# Patient Record
Sex: Female | Born: 1950 | Race: White | Hispanic: No
Health system: Midwestern US, Community
[De-identification: ages and names within clinical notes are randomized; demographics above are authoritative.]

## PROBLEM LIST (undated history)

## (undated) DIAGNOSIS — I739 Peripheral vascular disease, unspecified: Secondary | ICD-10-CM

## (undated) DIAGNOSIS — J449 Chronic obstructive pulmonary disease, unspecified: Secondary | ICD-10-CM

## (undated) DIAGNOSIS — IMO0002 Reserved for concepts with insufficient information to code with codable children: Secondary | ICD-10-CM

## (undated) DIAGNOSIS — F419 Anxiety disorder, unspecified: Secondary | ICD-10-CM

## (undated) HISTORY — PX: HAND SURGERY: SHX662

## (undated) HISTORY — PX: ABDOMINAL HYSTERECTOMY: SHX81

## (undated) HISTORY — DX: Reserved for concepts with insufficient information to code with codable children: IMO0002

## (undated) HISTORY — PX: APPENDECTOMY: SHX54

---

## 2009-06-09 ENCOUNTER — Ambulatory Visit: Payer: Self-pay | Admitting: Oncology

## 2009-06-22 ENCOUNTER — Inpatient Hospital Stay: Payer: Self-pay | Admitting: Internal Medicine

## 2009-07-10 ENCOUNTER — Ambulatory Visit: Payer: Self-pay | Admitting: Oncology

## 2014-01-21 ENCOUNTER — Inpatient Hospital Stay: Payer: Self-pay | Admitting: Internal Medicine

## 2014-01-21 LAB — CBC
HCT: 46.5 % (ref 35.0–47.0)
HGB: 15.7 g/dL (ref 12.0–16.0)
MCH: 31.3 pg (ref 26.0–34.0)
MCHC: 33.9 g/dL (ref 32.0–36.0)
MCV: 93 fL (ref 80–100)
Platelet: 199 10*3/uL (ref 150–440)
RBC: 5.02 10*6/uL (ref 3.80–5.20)
RDW: 12.1 % (ref 11.5–14.5)
WBC: 11.8 10*3/uL — ABNORMAL HIGH (ref 3.6–11.0)

## 2014-01-21 LAB — COMPREHENSIVE METABOLIC PANEL
ALK PHOS: 68 U/L
Albumin: 3.9 g/dL (ref 3.4–5.0)
Anion Gap: 7 (ref 7–16)
BILIRUBIN TOTAL: 0.5 mg/dL (ref 0.2–1.0)
BUN: 23 mg/dL — ABNORMAL HIGH (ref 7–18)
CHLORIDE: 94 mmol/L — AB (ref 98–107)
Calcium, Total: 9.1 mg/dL (ref 8.5–10.1)
Co2: 32 mmol/L (ref 21–32)
Creatinine: 0.59 mg/dL — ABNORMAL LOW (ref 0.60–1.30)
EGFR (African American): >60
EGFR (Non-African Amer.): >60
Glucose: 98 mg/dL (ref 65–99)
Osmolality: 270 (ref 275–301)
POTASSIUM: 4 mmol/L (ref 3.5–5.1)
SGOT(AST): 59 U/L — ABNORMAL HIGH (ref 15–37)
SGPT (ALT): 48 U/L
Sodium: 133 mmol/L — ABNORMAL LOW (ref 136–145)
Total Protein: 7.4 g/dL (ref 6.4–8.2)

## 2014-01-21 LAB — CK TOTAL AND CKMB (NOT AT ARMC)
CK, TOTAL: 657 U/L — AB
CK-MB: 40.3 ng/mL — ABNORMAL HIGH (ref 0.5–3.6)

## 2014-01-21 LAB — TROPONIN I: Troponin-I: <0.02 ng/mL

## 2014-01-22 LAB — LIPID PANEL
Cholesterol: 142 mg/dL (ref 0–200)
HDL Cholesterol: 53 mg/dL (ref 40–60)
Ldl Cholesterol, Calc: 75 mg/dL (ref 0–100)
Triglycerides: 72 mg/dL (ref 0–200)
VLDL Cholesterol, Calc: 14 mg/dL (ref 5–40)

## 2014-01-22 LAB — TSH: Thyroid Stimulating Horm: 0.778 u[IU]/mL

## 2014-01-23 LAB — BASIC METABOLIC PANEL
Anion Gap: 4 — ABNORMAL LOW (ref 7–16)
BUN: 21 mg/dL — ABNORMAL HIGH (ref 7–18)
CO2: 31 mmol/L (ref 21–32)
CREATININE: 0.56 mg/dL — AB (ref 0.60–1.30)
Calcium, Total: 7.8 mg/dL — ABNORMAL LOW (ref 8.5–10.1)
Chloride: 112 mmol/L — ABNORMAL HIGH (ref 98–107)
EGFR (Non-African Amer.): >60
Glucose: 80 mg/dL (ref 65–99)
OSMOLALITY: 294 (ref 275–301)
Potassium: 4.4 mmol/L (ref 3.5–5.1)
Sodium: 147 mmol/L — ABNORMAL HIGH (ref 136–145)

## 2014-01-23 LAB — MAGNESIUM: Magnesium: 2.2 mg/dL

## 2014-03-02 LAB — COMPREHENSIVE METABOLIC PANEL
ALBUMIN: 3.6 g/dL (ref 3.4–5.0)
ALK PHOS: 82 U/L
AST: 22 U/L (ref 15–37)
Anion Gap: 7 (ref 7–16)
BUN: 22 mg/dL — AB (ref 7–18)
Bilirubin,Total: 0.3 mg/dL (ref 0.2–1.0)
CALCIUM: 8.8 mg/dL (ref 8.5–10.1)
CHLORIDE: 103 mmol/L (ref 98–107)
CREATININE: 0.72 mg/dL (ref 0.60–1.30)
Co2: 31 mmol/L (ref 21–32)
Glucose: 92 mg/dL (ref 65–99)
Osmolality: 284 (ref 275–301)
POTASSIUM: 3.3 mmol/L — AB (ref 3.5–5.1)
SGPT (ALT): 30 U/L
Sodium: 141 mmol/L (ref 136–145)
TOTAL PROTEIN: 7.1 g/dL (ref 6.4–8.2)

## 2014-03-02 LAB — TSH: Thyroid Stimulating Horm: 1.95 u[IU]/mL

## 2014-03-02 LAB — DRUG SCREEN, URINE
Amphetamines, Ur Screen: NEGATIVE (ref ?–1000)
BARBITURATES, UR SCREEN: NEGATIVE (ref ?–200)
Benzodiazepine, Ur Scrn: POSITIVE (ref ?–200)
CANNABINOID 50 NG, UR ~~LOC~~: NEGATIVE (ref ?–50)
COCAINE METABOLITE, UR ~~LOC~~: NEGATIVE (ref ?–300)
MDMA (Ecstasy)Ur Screen: NEGATIVE (ref ?–500)
METHADONE, UR SCREEN: NEGATIVE (ref ?–300)
Opiate, Ur Screen: NEGATIVE (ref ?–300)
Phencyclidine (PCP) Ur S: NEGATIVE (ref ?–25)
Tricyclic, Ur Screen: NEGATIVE (ref ?–1000)

## 2014-03-02 LAB — URINALYSIS, COMPLETE
Bacteria: NONE SEEN
Bilirubin,UR: NEGATIVE
Glucose,UR: NEGATIVE mg/dL (ref 0–75)
Ketone: NEGATIVE
LEUKOCYTE ESTERASE: NEGATIVE
NITRITE: NEGATIVE
Ph: 5 (ref 4.5–8.0)
RBC,UR: 3 /HPF (ref 0–5)
Specific Gravity: 1.023 (ref 1.003–1.030)
Squamous Epithelial: <1

## 2014-03-02 LAB — CBC
HCT: 42.9 % (ref 35.0–47.0)
HGB: 14.6 g/dL (ref 12.0–16.0)
MCH: 32.5 pg (ref 26.0–34.0)
MCHC: 33.9 g/dL (ref 32.0–36.0)
MCV: 96 fL (ref 80–100)
Platelet: 259 10*3/uL (ref 150–440)
RBC: 4.48 10*6/uL (ref 3.80–5.20)
RDW: 13 % (ref 11.5–14.5)
WBC: 8.8 10*3/uL (ref 3.6–11.0)

## 2014-03-02 LAB — ETHANOL

## 2014-03-02 LAB — ACETAMINOPHEN LEVEL: Acetaminophen: 3 ug/mL — ABNORMAL LOW

## 2014-03-02 LAB — SALICYLATE LEVEL: SALICYLATES, SERUM: 2 mg/dL

## 2014-03-03 ENCOUNTER — Inpatient Hospital Stay: Payer: Self-pay | Admitting: Psychiatry

## 2014-03-04 LAB — RAPID HIV SCREEN (HIV 1/2 AB+AG)

## 2014-03-04 LAB — AMMONIA: Ammonia, Plasma: 15 mcmol/L (ref 11–32)

## 2014-03-11 ENCOUNTER — Emergency Department (HOSPITAL_COMMUNITY): Payer: Medicare Other

## 2014-03-11 ENCOUNTER — Emergency Department (HOSPITAL_COMMUNITY)
Admission: EM | Admit: 2014-03-11 | Discharge: 2014-03-12 | Disposition: A | Discharge status: Home / Self Care | Payer: Medicare Other | Attending: Emergency Medicine | Admitting: Emergency Medicine

## 2014-03-11 ENCOUNTER — Encounter (HOSPITAL_COMMUNITY): Payer: Self-pay | Admitting: *Deleted

## 2014-03-11 DIAGNOSIS — R4182 Altered mental status, unspecified: Secondary | ICD-10-CM | POA: Diagnosis not present

## 2014-03-11 DIAGNOSIS — R531 Weakness: Secondary | ICD-10-CM | POA: Diagnosis present

## 2014-03-11 DIAGNOSIS — E46 Unspecified protein-calorie malnutrition: Secondary | ICD-10-CM | POA: Insufficient documentation

## 2014-03-11 DIAGNOSIS — Z9049 Acquired absence of other specified parts of digestive tract: Secondary | ICD-10-CM | POA: Diagnosis not present

## 2014-03-11 DIAGNOSIS — F411 Generalized anxiety disorder: Secondary | ICD-10-CM

## 2014-03-11 DIAGNOSIS — F419 Anxiety disorder, unspecified: Secondary | ICD-10-CM | POA: Diagnosis not present

## 2014-03-11 DIAGNOSIS — Z90710 Acquired absence of both cervix and uterus: Secondary | ICD-10-CM | POA: Diagnosis not present

## 2014-03-11 DIAGNOSIS — Z7951 Long term (current) use of inhaled steroids: Secondary | ICD-10-CM | POA: Diagnosis not present

## 2014-03-11 DIAGNOSIS — T424X5A Adverse effect of benzodiazepines, initial encounter: Secondary | ICD-10-CM | POA: Diagnosis not present

## 2014-03-11 DIAGNOSIS — R1011 Right upper quadrant pain: Secondary | ICD-10-CM | POA: Diagnosis not present

## 2014-03-11 DIAGNOSIS — R4589 Other symptoms and signs involving emotional state: Secondary | ICD-10-CM

## 2014-03-11 DIAGNOSIS — J449 Chronic obstructive pulmonary disease, unspecified: Secondary | ICD-10-CM | POA: Diagnosis not present

## 2014-03-11 DIAGNOSIS — Z79899 Other long term (current) drug therapy: Secondary | ICD-10-CM | POA: Insufficient documentation

## 2014-03-11 DIAGNOSIS — F329 Major depressive disorder, single episode, unspecified: Secondary | ICD-10-CM | POA: Insufficient documentation

## 2014-03-11 DIAGNOSIS — R45851 Suicidal ideations: Secondary | ICD-10-CM

## 2014-03-11 DIAGNOSIS — R10811 Right upper quadrant abdominal tenderness: Secondary | ICD-10-CM

## 2014-03-11 HISTORY — DX: Anxiety disorder, unspecified: F41.9

## 2014-03-11 HISTORY — DX: Chronic obstructive pulmonary disease, unspecified: J44.9

## 2014-03-11 LAB — COMPREHENSIVE METABOLIC PANEL
ALT: 17 U/L (ref 0–35)
AST: 19 U/L (ref 0–37)
Albumin: 3.2 g/dL — ABNORMAL LOW (ref 3.5–5.2)
Alkaline Phosphatase: 60 U/L (ref 39–117)
Anion gap: 10 (ref 5–15)
BUN: 24 mg/dL — ABNORMAL HIGH (ref 6–23)
CALCIUM: 9.3 mg/dL (ref 8.4–10.5)
CO2: 27 mEq/L (ref 19–32)
CREATININE: 0.53 mg/dL (ref 0.50–1.10)
Chloride: 107 mEq/L (ref 96–112)
GFR calc non Af Amer: >90 mL/min (ref >90)
Glucose, Bld: 82 mg/dL (ref 70–99)
Potassium: 4.1 mEq/L (ref 3.7–5.3)
Sodium: 144 mEq/L (ref 137–147)
Total Protein: 6 g/dL (ref 6.0–8.3)

## 2014-03-11 LAB — RAPID URINE DRUG SCREEN, HOSP PERFORMED
Amphetamines: NOT DETECTED
Barbiturates: NOT DETECTED
Benzodiazepines: NOT DETECTED
COCAINE: NOT DETECTED
OPIATES: NOT DETECTED
Tetrahydrocannabinol: NOT DETECTED

## 2014-03-11 LAB — CBC WITH DIFFERENTIAL/PLATELET
BASOS ABS: 0 10*3/uL (ref 0.0–0.1)
Basophils Relative: 1 % (ref 0–1)
EOS PCT: 2 % (ref 0–5)
Eosinophils Absolute: 0.1 10*3/uL (ref 0.0–0.7)
HEMATOCRIT: 37.5 % (ref 36.0–46.0)
HEMOGLOBIN: 12.2 g/dL (ref 12.0–15.0)
LYMPHS PCT: 17 % (ref 12–46)
Lymphs Abs: 0.9 10*3/uL (ref 0.7–4.0)
MCH: 31.3 pg (ref 26.0–34.0)
MCHC: 32.5 g/dL (ref 30.0–36.0)
MCV: 96.2 fL (ref 78.0–100.0)
MONOS PCT: 14 % — AB (ref 3–12)
Monocytes Absolute: 0.7 10*3/uL (ref 0.1–1.0)
Neutro Abs: 3.5 10*3/uL (ref 1.7–7.7)
Neutrophils Relative %: 66 % (ref 43–77)
Platelets: 179 10*3/uL (ref 150–400)
RBC: 3.9 MIL/uL (ref 3.87–5.11)
RDW: 13 % (ref 11.5–15.5)
WBC: 5.2 10*3/uL (ref 4.0–10.5)

## 2014-03-11 LAB — URINALYSIS, ROUTINE W REFLEX MICROSCOPIC
BILIRUBIN URINE: NEGATIVE
Glucose, UA: NEGATIVE mg/dL
Hgb urine dipstick: NEGATIVE
Ketones, ur: NEGATIVE mg/dL
LEUKOCYTES UA: NEGATIVE
NITRITE: NEGATIVE
PH: 6.5 (ref 5.0–8.0)
Protein, ur: NEGATIVE mg/dL
Specific Gravity, Urine: 1.014 (ref 1.005–1.030)
UROBILINOGEN UA: 0.2 mg/dL (ref 0.0–1.0)

## 2014-03-11 LAB — ACETAMINOPHEN LEVEL

## 2014-03-11 LAB — LIPASE, BLOOD: LIPASE: 28 U/L (ref 11–59)

## 2014-03-11 LAB — ETHANOL: Alcohol, Ethyl (B): <11 mg/dL (ref 0–11)

## 2014-03-11 LAB — SALICYLATE LEVEL

## 2014-03-11 MED ORDER — ALBUTEROL SULFATE HFA 108 (90 BASE) MCG/ACT IN AERS: 2.0000 | INHALATION_SPRAY | Freq: Four times a day (QID) | RESPIRATORY_TRACT | Status: DC | PRN

## 2014-03-11 MED ORDER — ACETAMINOPHEN 325 MG PO TABS: 650.0000 mg | ORAL_TABLET | ORAL | Status: DC | PRN

## 2014-03-11 MED ORDER — CLONAZEPAM 0.5 MG PO TABS: 0.5000 mg | ORAL_TABLET | Freq: Three times a day (TID) | ORAL | Status: DC | PRN

## 2014-03-11 MED ORDER — IBUPROFEN 400 MG PO TABS: 600.0000 mg | ORAL_TABLET | Freq: Three times a day (TID) | ORAL | Status: DC | PRN

## 2014-03-11 MED ORDER — ALUM & MAG HYDROXIDE-SIMETH 200-200-20 MG/5ML PO SUSP: 30.0000 mL | ORAL | Status: DC | PRN

## 2014-03-11 MED ORDER — MIRTAZAPINE 15 MG PO TABS
15.0000 mg | ORAL_TABLET | Freq: Every day | ORAL | Status: DC
  Administered 2014-03-11: 15 mg via ORAL
  Filled 2014-03-11: qty 1

## 2014-03-11 MED ORDER — ADULT MULTIVITAMIN W/MINERALS CH
1.0000 | ORAL_TABLET | Freq: Once | ORAL | Status: AC
  Administered 2014-03-11: 1 via ORAL
  Filled 2014-03-11: qty 1

## 2014-03-11 MED ORDER — VITAMIN B-1 100 MG PO TABS
100.0000 mg | ORAL_TABLET | Freq: Once | ORAL | Status: AC
Start: 2014-03-11 — End: 2014-03-11
  Administered 2014-03-11: 100 mg via ORAL
  Filled 2014-03-11: qty 1

## 2014-03-11 MED ORDER — FOLIC ACID 1 MG PO TABS
1.0000 mg | ORAL_TABLET | Freq: Once | ORAL | Status: AC
  Administered 2014-03-11: 1 mg via ORAL
  Filled 2014-03-11: qty 1

## 2014-03-11 MED ORDER — GUAIFENESIN ER 600 MG PO TB12
600.0000 mg | ORAL_TABLET | Freq: Two times a day (BID) | ORAL | Status: DC
  Administered 2014-03-11 (×2): 600 mg via ORAL
  Filled 2014-03-11 (×3): qty 1

## 2014-03-11 MED ORDER — DIPHENHYDRAMINE HCL 25 MG PO CAPS
25.0000 mg | ORAL_CAPSULE | Freq: Four times a day (QID) | ORAL | Status: DC | PRN
  Administered 2014-03-11: 25 mg via ORAL
  Filled 2014-03-11: qty 1

## 2014-03-11 MED ORDER — GUAIFENESIN 400 MG PO TABS: 400.0000 mg | ORAL_TABLET | Freq: Four times a day (QID) | ORAL | Status: DC | PRN

## 2014-03-11 MED ORDER — ONDANSETRON HCL 4 MG PO TABS: 4.0000 mg | ORAL_TABLET | Freq: Three times a day (TID) | ORAL | Status: DC | PRN

## 2014-03-11 MED ORDER — TIOTROPIUM BROMIDE MONOHYDRATE 18 MCG IN CAPS
18.0000 ug | ORAL_CAPSULE | Freq: Every day | RESPIRATORY_TRACT | Status: DC
  Filled 2014-03-11: qty 5

## 2014-03-11 MED ORDER — ZOLPIDEM TARTRATE 5 MG PO TABS: 5.0000 mg | ORAL_TABLET | Freq: Every evening | ORAL | Status: DC | PRN

## 2014-03-11 MED ORDER — DIPHENHYDRAMINE HCL 25 MG PO TABS: 25.0000 mg | ORAL_TABLET | Freq: Every day | ORAL | Status: DC

## 2014-03-11 MED ORDER — HALOPERIDOL 2 MG PO TABS
2.0000 mg | ORAL_TABLET | Freq: Every day | ORAL | Status: DC
  Administered 2014-03-11: 2 mg via ORAL
  Filled 2014-03-11 (×2): qty 1

## 2014-03-11 MED ORDER — MOMETASONE FURO-FORMOTEROL FUM 100-5 MCG/ACT IN AERO: 2.0000 | INHALATION_SPRAY | Freq: Two times a day (BID) | RESPIRATORY_TRACT | Status: DC

## 2014-03-11 MED ORDER — NICOTINE 21 MG/24HR TD PT24
21.0000 mg | MEDICATED_PATCH | Freq: Every day | TRANSDERMAL | Status: DC
  Filled 2014-03-11: qty 1

## 2014-03-11 NOTE — ED Notes (Signed)
Pt son at bedside.

## 2014-03-11 NOTE — BH Assessment (Addendum)
Tele Assessment Note   Melissa George is an 63 y.o. female female that reports a reaction to a new medication (Cymbalta).  Patient reports depression but denies traumatic events.  Patient reports feelings of stress and an inability to concentrate.  Patient reports that she has been hearing someone mumbeling however, no one else can hear this mumbling.  Patient denies command from the numbering.  Patient reports that she is not able to understand the mumbling.  Patient reports that the mumbling has been going on for the past 2 weeks.    Patient was brought to the ED by her son that flew down from [place] who states that she has been talking acting as if she was very confused when he would speak to her on the phone.  Her son reports gradually worsening confusion and depression.  The patient has been experiencing suicidal thoughts occasionally though she states she does not have these today and has no intention of acting on them.  Patient reported a significant weight loss.  Patient currently weighs 80 pounds and her normal weight is 96 pounds.  Patient reports that she does not have a desire to eat.  Patient denies traumatic events in her life.    Patient denies hallucinating and substance abuse.  Patient denies physical, sexual or emotional abuse.     Axis I: Major Depression, Recurrent severe Axis II: Deferred Axis III:  Past Medical History  Diagnosis Date  . Anxiety   . COPD (chronic obstructive pulmonary disease)    Axis IV: economic problems, problems related to social environment, problems with access to health care services and problems with primary support group Axis V: 31-40 impairment in reality testing  Past Medical History:  Past Medical History  Diagnosis Date  . Anxiety   . COPD (chronic obstructive pulmonary disease)     Past Surgical History  Procedure Laterality Date  . Hand surgery    . Appendectomy    . Abdominal hysterectomy    . Cesarean section      Family  History: No family history on file.  Social History:  reports that she has quit smoking. She does not have any smokeless tobacco history on file. She reports that she does not drink alcohol or use illicit drugs.  Additional Social History:     CIWA: CIWA-Ar BP: 101/61 mmHg Pulse Rate: 85 COWS:    PATIENT STRENGTHS: (choose at least two) Average or above average intelligence Capable of independent living Communication skills Supportive family/friends Work skills  Allergies:  Allergies  Allergen Reactions  . Ciprofloxacin Shortness Of Breath and Itching  . Levofloxacin Other (See Comments)    Unknown  . Morphine And Related Nausea And Vomiting  . Sulfa Antibiotics Hives and Itching  . Symbicort [Budesonide-Formoterol Fumarate] Other (See Comments)    Psychotic episode    Home Medications:  (Not in a hospital admission)  OB/GYN Status:  No LMP recorded. Patient is postmenopausal.  General Assessment Data Location of Assessment: [facility] Is this a Tele or Face-to-Face Assessment?: Tele Assessment Is this an Initial Assessment or a Re-assessment for this encounter?: Initial Assessment Living Arrangements: Alone Can pt return to current living arrangement?: Yes Admission Status: Voluntary Is patient capable of signing voluntary admission?: Yes Transfer from: Home Referral Source: Psychiatrist  Medical Screening Exam ([place]) Medical Exam completed: Yes  [place] Living Arrangements: Alone Name of Psychiatrist: NA Name of Therapist: NA  Education Status Is patient currently in  school?: No Current Grade: NA Highest grade of school patient has completed: NA Name of school: NA Contact person: NA  Risk to self with the past 6 months Suicidal Ideation: No Suicidal Intent: No Is patient at risk for suicide?: No Suicidal Plan?: No Access to Means: No What has been your use of drugs/alcohol within the last 12 months?:  NA Previous Attempts/Gestures: No How many times?: 0 Other Self Harm Risks: NA Triggers for Past Attempts:  (NA) Intentional Self Injurious Behavior: None Family Suicide History: No Recent stressful life event(s): Financial Problems Persecutory voices/beliefs?: Yes Depression: Yes Depression Symptoms: Fatigue, Feeling worthless/self pity, Loss of interest in usual pleasures Substance abuse history and/or treatment for substance abuse?: No Suicide prevention information given to non-admitted patients: Not applicable  Risk to Others within the past 6 months Homicidal Ideation: No Thoughts of Harm to Others: No Current Homicidal Intent: No Current Homicidal Plan: No Access to Homicidal Means: No Identified Victim: NA History of harm to others?: No Assessment of Violence: None Noted Violent Behavior Description: NA Does patient have access to weapons?: No Criminal Charges Pending?: No Does patient have a court date: No  Psychosis Hallucinations: Auditory (Hearing mumbling voices.) Delusions: None noted  Mental Status Report Appear/Hygiene: Disheveled, In scrubs Eye Contact: Fair Motor Activity: Freedom of movement Speech: Logical/coherent Level of Consciousness: Alert Mood: Depressed Affect: Depressed, Blunted Anxiety Level: None Thought Processes: Coherent, Relevant Judgement: Unimpaired Obsessive Compulsive Thoughts/Behaviors: None  Cognitive Functioning Concentration: Normal Memory: Recent Intact, Remote Intact IQ: Average Insight: Fair Impulse Control: Fair Appetite: Poor Weight Loss:  (20 pound weight loss. ) Weight Gain:  (None Reported) Sleep: Decreased Total Hours of Sleep:  (3-4 hours ) Vegetative Symptoms: None  ADLScreening [facility]) Patient's cognitive ability adequate to safely complete daily activities?: Yes Patient able to express need for assistance with ADLs?: Yes Independently performs ADLs?: Yes (appropriate for developmental  age)  Prior Inpatient Therapy Prior Inpatient Therapy: Yes Prior Therapy Dates: 02-2014 Prior Therapy Facilty/Provider(s): [facility]  Reason for Treatment: Depression   Prior Outpatient Therapy Prior Outpatient Therapy: Yes Prior Therapy Dates: Ongoing  Prior Therapy Facilty/Provider(s): Dr. Doren Custard  Reason for Treatment: Medication Management   ADL Screening (condition at time of admission) Patient's cognitive ability adequate to safely complete daily activities?: Yes Patient able to express need for assistance with ADLs?: Yes Independently performs ADLs?: Yes (appropriate for developmental age)             Advance Directives (For Healthcare) Does patient have an advance directive?: No    Additional Information 1:1 In Past 12 Months?: No     Disposition: Per Heloise Purpura patient meets criteria for inpatient hospitalization.  Disposition Initial Assessment Completed for this Encounter: Yes Disposition of Patient: Other dispositions Other disposition(s):  (Pending psych disposition,. )  Johnnye Sima, Kingstyn Deruiter [place] 03/11/2014 3:12 PM

## 2014-03-11 NOTE — ED Notes (Signed)
Pt brought back to [facility].  Pt son inquired about reason mother was in psych unit. Nurse explained if pt expresses suicidal ideation, that pt has to be held for safety reasons.

## 2014-03-11 NOTE — ED Notes (Signed)
Pt inquired about test results from med clearance.  Dewitt Hoes, [place] notified.

## 2014-03-11 NOTE — ED Notes (Signed)
Patient transported to Ultrasound 

## 2014-03-11 NOTE — BH Assessment (Signed)
Writer contacted Melissa George at [facility] regarding the patient beng able to come back to their facility for treatment.  Writer faxed referral to [facility].

## 2014-03-11 NOTE — ED Provider Notes (Signed)
The patient is a 63 year old female, she has a history of depression recently, has felt as though the depression is worsening, she has been talking with her son who lives in [place] who flew down to see her yesterday who states that she has been talking with this pair over the last several days, it is gradually worsening. The patient has been experiencing suicidal thoughts occasionally though she states she does not have these today and has no intention of acting on them. She has also been medically wasting away with a significant weight loss now weighing less than approximately 80 pounds. She denies chest pain abdominal pain back pain dysuria diarrhea fevers chills nausea vomiting or swelling. On exam the patient appears depressed with a flat affect, she is not hallucinating and is not suicidal, she does not drink alcohol or use illegal drugs, she has normal heart and lung sounds and otherwise appears to be in no distress. She would benefit from psychiatric evaluation as her weight loss is likely secondary to her depression.  I personally spoke, with [facility] evaluation personnel to arrange psych eval.  Pt in agreement with consultation request  Medical screening examination/treatment/procedure(s) were conducted as a shared visit with non-physician practitioner(s) and myself.  I personally evaluated the patient during the encounter.  Clinical Impression:   Final diagnoses:  Abdominal tenderness, RUQ (right upper quadrant)  Altered mental status         Johnna Acosta, MD 03/11/14 2014

## 2014-03-11 NOTE — BH Assessment (Signed)
Writer received clinical collateral information from Dr. Sabra Heck.  The doctor reports that she is waisting away due to her not not eating.  Patient resides alone with her dogs.  Dr. Sabra Heck reports that the patient denies SI/HI/Psychosis.  Dr. Sabra Heck reports that the patient did not disclose any traumatic event that has caused her to become depressed and stop eating.

## 2014-03-11 NOTE — ED Notes (Signed)
Pt states that she has generalized weakness and feeling like she was going to fall for over 1 month. Pt family states that she was recently hospitalized for possible psychosis related to medications and COPD exacerbation. Pt has recently had medication including changing from xanax to clonazepam which pt took this morning. Pt family is also concerned with recent weight loss. No neuro deficits noted.

## 2014-03-11 NOTE — ED Provider Notes (Signed)
CSN: [id number]     Arrival date & time 03/11/14  0911 History   First MD Initiated Contact with Patient 03/11/14 0932     Chief Complaint  Patient presents with  . Weakness     (Consider location/radiation/quality/duration/timing/severity/associated sxs/prior Treatment) HPI Comments: Melissa George is a 63 y.o. female with a PMHx of anxiety and COPD, who presents to the ED with complaints of fatigue and weakness x64month. Her son accompanies her today and provides some of the history. He reports that last week she was admitted to [facility] for 4 days after a "psychotic episode" due to being changed from his prevent to Symbicort. She was discharged on Thursday and started on several new medications including Haldol, Benadryl, Remeron, and Spiriva. The son reports that she took her Klonopin this morning without eating. Patient reports that she feels very fatigued at this time, and that over the last month she has felt increasing fatigue and loss of appetite as well as feelings of worthlessness and hopelessness, and endorses a desire to "disappear". Her son states that for many years he has believed that his mother needs to have psychiatric evaluation and care, but she has not been compliant with outpatient management. She reports that this morning she had a episode of lightheadedness and feeling like she was "falling back" but never had any syncopal episode. Additionally she endorses nausea and vomiting intermittently over the last several days, but states she has been able to tolerate fluids and food. She states occasionally she hears "conversations" which she describes as auditory hallucinations but she does not recognize the voices nor do they tell her to do anything specific. She does not have a suicide plan at this time, but states she has thought of suicide. She has noticed her urine has become malodorous, but she denies any dysuria or hematuria. She previously smoked  heavily, but now she takes a nuclear impact. She denies any alcohol or illicit drug use. She denies any visual hallucinations or homicidal ideations. She denies any fevers, chills, chest pain, shortness of breath, vision changes, vertigo, tinnitus, ear pain, cough, wheezing, abdomen pain, hematochezia, melena, hematemesis, vaginal discharge/bleeding, paresthesias, or numbness. Denies back or neck pain.   Patient is a 63 y.o. female presenting with weakness and mental health disorder. The history is provided by the patient and a relative. No language interpreter was used.  Weakness This is a chronic problem. The current episode started more than 1 month ago. The problem occurs constantly. The problem has been unchanged. Associated symptoms include anorexia, fatigue, nausea, urinary symptoms (malodorous urine), vomiting and weakness. Pertinent negatives include no abdominal pain, arthralgias, change in bowel habit, chest pain, chills, congestion, coughing, fever, headaches, joint swelling, myalgias, neck pain, numbness or vertigo. Exacerbated by: new medications. She has tried nothing for the symptoms. The treatment provided no relief.  Mental Health Problem Presenting symptoms: depression, hallucinations (auditory) and suicidal thoughts   Presenting symptoms: no homicidal ideas, no self mutilation, no suicidal threats and no suicide attempt   Patient accompanied by:  Child (adult son) Onset quality:  Gradual Duration: several months. Timing:  Constant Progression:  Worsening Chronicity:  Chronic Context: recent medication change   Treatment compliance:  All of the time Time since last psychoactive medication taken:  2 hours (klonopin) Relieved by:  Nothing Worsened by:  Nothing tried Ineffective treatments:  None tried Associated symptoms: anhedonia, anxiety, appetite change, fatigue, feelings of worthlessness and weight change (decreased)   Associated symptoms: no abdominal  pain, no chest pain  and no headaches   Risk factors: hx of mental illness and recent psychiatric admission     Past Medical History  Diagnosis Date  . Anxiety   . COPD (chronic obstructive pulmonary disease)    Past Surgical History  Procedure Laterality Date  . Hand surgery    . Appendectomy    . Abdominal hysterectomy    . Cesarean section     No family history on file. History  Substance Use Topics  . Smoking status: Former Research scientist (life sciences)  . Smokeless tobacco: Not on file  . Alcohol Use: No   OB History    No data available     Review of Systems  Constitutional: Positive for appetite change, fatigue and unexpected weight change (weight loss). Negative for fever and chills.  HENT: Negative for congestion and ear pain.   Respiratory: Negative for cough, shortness of breath and wheezing.   Cardiovascular: Negative for chest pain.  Gastrointestinal: Positive for nausea, vomiting and anorexia. Negative for abdominal pain, diarrhea, constipation, blood in stool and change in bowel habit.  Genitourinary: Negative for dysuria, urgency, frequency, hematuria, flank pain, vaginal bleeding and vaginal discharge.       +malodorous urine  Musculoskeletal: Negative for myalgias, back pain, joint swelling, arthralgias and neck pain.  Skin: Negative for color change.  Neurological: Positive for weakness and light-headedness. Negative for dizziness, vertigo, tremors, syncope, numbness and headaches.  Psychiatric/Behavioral: Positive for suicidal ideas and hallucinations (auditory). Negative for homicidal ideas, confusion and self-injury. The patient is nervous/anxious.    10 Systems reviewed and are negative for acute change except as noted in the HPI.    Allergies  Ciprofloxacin; Levofloxacin; Morphine and related; Sulfa antibiotics; and Symbicort  Home Medications   Prior to Admission medications   Medication Sig Start Date End Date Taking? Authorizing Provider  acetaminophen (TYLENOL) 325 MG tablet Take  325 mg by mouth every 6 (six) hours as needed (pain/headache).   Yes Historical Provider, MD  clonazePAM (KLONOPIN) 0.5 MG tablet Take 0.5 mg by mouth every 8 (eight) hours as needed. 03/06/14  Yes Historical Provider, MD  [facility] ALLERGY 25 MG tablet Take 25 mg by mouth at bedtime. 03/06/14  Yes Historical Provider, MD  Fluticasone-Salmeterol (ADVAIR) 250-50 MCG/DOSE AEPB Inhale 1 puff into the lungs 2 (two) times daily.   Yes Historical Provider, MD  guaifenesin (HUMIBID E) 400 MG TABS tablet Take 400 mg by mouth every 6 (six) hours as needed (congestion).   Yes Historical Provider, MD  haloperidol (HALDOL) 2 MG tablet Take 2 mg by mouth at bedtime. 03/06/14  Yes Historical Provider, MD  mirtazapine (REMERON) 15 MG tablet Take 15 mg by mouth at bedtime. 03/06/14  Yes Historical Provider, MD  PROAIR HFA 108 (90 BASE) MCG/ACT inhaler Inhale 2 puffs into the lungs every 6 (six) hours as needed for shortness of breath.  01/24/14  Yes Historical Provider, MD  tiotropium (SPIRIVA) 18 MCG inhalation capsule Place 18 mcg into inhaler and inhale daily.   Yes Historical Provider, MD   BP 107/49 mmHg  Pulse 94  Temp(Src) 97.7 F (36.5 C) (Oral)  Resp 22  SpO2 97% Physical Exam  Constitutional: She is oriented to person, place, and time. Vital signs are normal. She appears cachectic. She is easily aroused.  Non-toxic appearance. No distress.  Thin frail appearing female, cachectic. VSS, alert but drowsy, easily aroused.  HENT:  Head: Normocephalic and atraumatic.  Mouth/Throat: Oropharynx is clear and moist and mucous membranes  are normal.  Eyes: Conjunctivae and EOM are normal. Pupils are equal, round, and reactive to light. Right eye exhibits no discharge. Left eye exhibits no discharge.  Neck: Normal range of motion. Neck supple.  Cardiovascular: Normal rate, regular rhythm, normal heart sounds and intact distal pulses.  Exam reveals no gallop and no friction rub.   No murmur heard. Pulmonary/Chest:  Effort normal and breath sounds normal. No respiratory distress. She has no decreased breath sounds. She has no wheezes. She has no rhonchi. She has no rales.  Abdominal: Soft. Normal appearance and bowel sounds are normal. She exhibits no distension. There is tenderness in the right upper quadrant. There is positive Murphy's sign. There is no rigidity, no rebound, no guarding, no CVA tenderness and no tenderness at McBurney's point.    Soft, ND, +BS throughout, with RUQ TTP, no r/g/r, +murphy's, neg mcburney's, no CVA TTP  Musculoskeletal: Normal range of motion.  MAE x4 symmetrically Strength 5/5 in all extremities Sensation grossly intact in all extremities Gait steady with forward ambulation but pt attempted to walk backwards and had to brace herself  Neurological: She is alert, oriented to person, place, and time and easily aroused. She has normal strength. No cranial nerve deficit or sensory deficit. She displays a negative Romberg sign. Coordination and gait normal.  A&O x4 although drowsy CN 2-12 grossly intact Sensation and strength at baseline Gait steady and nonataxic Coordination intact Neg romberg  Skin: Skin is warm, dry and intact. No rash noted.  Psychiatric: Her affect is blunt (flat). She is actively hallucinating (prior auditory hallucinations). Thought content is not delusional. She exhibits a depressed mood. She expresses suicidal ideation. She expresses no homicidal ideation. She expresses no suicidal plans and no homicidal plans.  Depressed, endorsing SI without a plan, endorses auditory hallucinations in the past but none at this time. Flat affect.  Nursing note and vitals reviewed.   ED Course  Procedures (including critical care time) Labs Review Labs Reviewed  CBC WITH DIFFERENTIAL - Abnormal; Notable for the following:    Monocytes Relative 14 (*)    All other components within normal limits  COMPREHENSIVE METABOLIC PANEL - Abnormal; Notable for the  following:    BUN 24 (*)    Albumin 3.2 (*)    Total Bilirubin <0.2 (*)    All other components within normal limits  SALICYLATE LEVEL - Abnormal; Notable for the following:    Salicylate Lvl <6.3 (*)    All other components within normal limits  ETHANOL  ACETAMINOPHEN LEVEL  LIPASE, BLOOD  URINE RAPID DRUG SCREEN (HOSP PERFORMED)  URINALYSIS, ROUTINE W REFLEX MICROSCOPIC    Imaging Review Ct Head Wo Contrast  03/11/2014   CLINICAL DATA:  Generalized weakness for 1 month, altered mental status  EXAM: CT HEAD WITHOUT CONTRAST  TECHNIQUE: Contiguous axial images were obtained from the base of the skull through the vertex without intravenous contrast.  COMPARISON:  03/04/2014  FINDINGS: No skull fracture is noted. Paranasal sinuses and mastoid air cells are unremarkable.  No acute cortical infarction.  No mass lesion is noted on this unenhanced scan. Mild cerebral atrophy. The gray and white-matter differentiation is preserved. No hydrocephalus.  IMPRESSION: No acute intracranial abnormality.  Mild cerebral atrophy.   Electronically Signed   By: Lahoma Crocker M.D.   On: 03/11/2014 13:38   US Abdomen Limited Ruq  03/11/2014   CLINICAL DATA:  Abdominal pain, right upper quadrant pain  EXAM: US ABDOMEN LIMITED - RIGHT UPPER QUADRANT  COMPARISON:  None.  FINDINGS: Gallbladder:  Gallbladder is not distended. A thickened gallbladder fold is noted measures about 4 mm. No thickening of gallbladder wall. Small gallstone is noted in dependent gallbladder measures about 2.2 mm.  Common bile duct:  Diameter: 2.8 mm in diameter within normal limits.  Liver:  No focal lesion identified. Mild increased echogenicity of the liver suspicious for fatty infiltration.  IMPRESSION: 1. There is a gallbladder fold the noted. Measures 4 mm. Small gallstone in dependent gallbladder measures 2 mm. No thickening of gallbladder wall. No sonographic Murphy's sign. Question fatty infiltration of the liver.   Electronically Signed    By: Lahoma Crocker M.D.   On: 03/11/2014 11:41     EKG Interpretation None    EKG: NSR, borderline LVH  MDM   Final diagnoses:  Abdominal tenderness, RUQ (right upper quadrant)  Altered mental status  Depressed affect  Suicidal ideations  Malnutrition    63 y.o. female with generalized weakness. Son reports she is at baseline considering she took her klonopin this morning, but that he believes she needs psych help. Endorses SI without a plan. Will get screening labs. Also having RUQ TTP, will obtain u/s. Given questionable altered state, will obtain CT head. EKG obtained and reveals NSR with borderline LVH, no suspicious findings for weakness/AMS. Will reassess shortly.  1:50 PM CBC with diff unremarkable. CMP with mildly elevated BUN, will encourage oral hydration. EtOH level neg. Salicylate and APAP level neg. Lipase WNL. RUQ u/s with gallbladder fold and small stone but no cholecystitis. Head CT unremarkable. Reviewed [facility] records, MRI head was unremarkable last week. I believe pt meets need for inpatient psych treatment. Will consult TTS. Pt otherwise medically cleared. Re-ordered home meds, but note that Advair not on formulary therefore did not re-order. She may use her home advair if desired. Will also supplement with thiamine/folate/multivitamin now. Will transfer to pod C. Pt has not been weighed or had U/A done yet, will ask nursing to get these. Will give PO fluids to encourage urine production. Of note, pt more awake and alert at this time, but still endorsing SI without a plan.   3:07 PM U/A unremarkable. UDS neg. Weight today 82lbs, consistent with prior admission. TTS consulted and spoke with Dr. Noemi Chapel. Pt will be transferred to pod C. Please see TTS consult note for further documentation of care.  BP 101/61 mmHg  Pulse 85  Temp(Src) 97.7 F (36.5 C) (Oral)  Resp 14  Ht 5\' 2"  (1.575 m)  Wt 82 lb 5 oz (37.337 kg)  BMI 15.05 kg/m2  SpO2 95%  Meds ordered this  encounter  Medications  . nicotine (NICODERM CQ - dosed in mg/24 hours) patch 21 mg    Sig:   . ondansetron (ZOFRAN) tablet 4 mg    Sig:   . alum & mag hydroxide-simeth (MAALOX/MYLANTA) 200-200-20 MG/5ML suspension 30 mL    Sig:   . acetaminophen (TYLENOL) tablet 650 mg    Sig:   . ibuprofen (ADVIL,MOTRIN) tablet 600 mg    Sig:   . clonazePAM (KLONOPIN) tablet 0.5 mg    Sig:   . haloperidol (HALDOL) tablet 2 mg    Sig:   . mirtazapine (REMERON) tablet 15 mg    Sig:   . albuterol (PROVENTIL HFA;VENTOLIN HFA) 108 (90 BASE) MCG/ACT inhaler 2 puff    Sig:   . tiotropium (SPIRIVA) inhalation capsule 18 mcg    Sig:   . zolpidem (AMBIEN) tablet 5 mg  Sig:   . thiamine (VITAMIN B-1) tablet 100 mg    Sig:   . folic acid (FOLVITE) tablet 1 mg    Sig:   . multivitamin with minerals tablet 1 tablet    Sig:   . guaiFENesin (MUCINEX) 12 hr tablet 600 mg    Sig:      Patty Sermons Gause, PA-C 03/11/14 1526  Johnna Acosta, MD 03/11/14 2014

## 2014-03-11 NOTE — Consult Note (Signed)
Telepsych Consultation   Reason for Consult:  Patient disposition Referring Physician:  Sabra Heck MD Melissa George is an 63 y.o. female.  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 Anxiety   . COPD (chronic obstructive pulmonary disease)    AXIS IV:  Medicine management AXIS V:  61-70 mild symptoms  Plan:  No evidence of imminent risk to self or others at present.   Patient does not meet criteria for psychiatric inpatient admission. Follow up with out patient resources as directed  Subjective:   Melissa George is a 63 y.o. female patient presenting to the [facility] voluntarily and accompanied with her son who is not Melissa George. The patient was recently d/c from [facility] on Thanksgiving Day. The patient was admitted a total of four days prior due to AMS changes, AVH, confusion and passive SI. It was determined during her hospital stay that the patient was having side effects from her Rx Symbicort MDI. The medicine was appropriately discontinued and hence the patient is returning to her baseline mentation. The patient at this time is denying any SI/SA/HI, AVM, paranoia or delusional thoughts. The patient presented to [facility] earlier today due to increased somnolence and some dysequilibrium, which she believes commenced since  taking her Rx Klonopin. The patient has a hx of GAD but is denying agoraphobia or panic attacks. The patient has been worked up and medically cleared but awaiting (Repeat) psychiatric evaluation for disposition based upon an earlier assessment and collaboration with the NP which deemed her meeting criteria for IP admission around 3 pm today due too MDD recurrent severe. The patient is of casual appearance, soft spoken, attentive with congruent affect. No appreciable thought blocking or responce  external stimuli appreciated. Patient speech is normal tone and non pressured. Noted logical thought process with appropriate res ponces.  HPI Elements:  Location: adverse effects from Klonopin Quality: somnolence, dysequilibrium Severity: mild to moderate Timing: last 24 hours Duration: acute Context:   Needing out patient medical management of psychotropics and continuity of care    Past Psychiatric History: Past Medical History  Diagnosis Date  . Anxiety   . COPD (chronic obstructive pulmonary disease)     reports that she has quit smoking. She does not have any smokeless tobacco history on file. She reports that she does not drink alcohol or use illicit drugs. No family history on file. Family History Substance Abuse: No Family Supports: Yes, List: (Sons lives in [place] and [place]) Living Arrangements: Alone Can pt return to current living arrangement?: Yes Allergies:   Allergies  Allergen Reactions  . Ciprofloxacin Shortness Of Breath and Itching  . Levofloxacin Other (See Comments)    Unknown  . Morphine And Related Nausea And Vomiting  . Sulfa Antibiotics Hives and Itching  . Symbicort [Budesonide-Formoterol Fumarate] Other (See Comments)    Psychotic episode    ACT Assessment Complete:  Yes:    Educational Status    Risk to Self: Risk to self with the past 6 months Suicidal Ideation: No Suicidal Intent: No Is patient at risk for suicide?: No Suicidal Plan?: No Access to Means: No What has been your use of drugs/alcohol within the last 12 months?: NA Previous Attempts/Gestures: No How many times?: 0 Other Self Harm Risks: NA Triggers for Past Attempts:  (NA) Intentional Self Injurious Behavior: None Family  Suicide History: No Recent stressful life event(s): Financial Problems Persecutory voices/beliefs?: Yes Depression: Yes Depression Symptoms: Fatigue, Feeling worthless/self pity, Loss of interest in usual pleasures Substance abuse history and/or  treatment for substance abuse?: Yes Suicide prevention information given to non-admitted patients: Not applicable  Risk to Others: Risk to Others within the past 6 months Homicidal Ideation: No Thoughts of Harm to Others: No Current Homicidal Intent: No Current Homicidal Plan: No Access to Homicidal Means: No Identified Victim: NA History of harm to others?: No Assessment of Violence: None Noted Violent Behavior Description: NA Does patient have access to weapons?: No Criminal Charges Pending?: No Does patient have a court date: No  Abuse:    Prior Inpatient Therapy: Prior Inpatient Therapy Prior Inpatient Therapy: Yes Prior Therapy Dates: 02-2014 Prior Therapy Facilty/Provider(s): [facility]  Reason for Treatment: Depression   Prior Outpatient Therapy: Prior Outpatient Therapy Prior Outpatient Therapy: Yes Prior Therapy Dates: Ongoing  Prior Therapy Facilty/Provider(s): Dr. Doren Custard  Reason for Treatment: Medication Management   Additional Information: Additional Information 1:1 In Past 12 Months?: No                  Objective: Blood pressure 122/46, pulse 98, temperature 98 F (36.7 C), temperature source Oral, resp. rate 16, height '5\' 2"'  (1.575 m), weight 37.337 kg (82 lb 5 oz), SpO2 95 %.Body mass index is 15.05 kg/(m^2). Results for orders placed or performed during the hospital encounter of 03/11/14 (from the past 72 hour(s))  CBC WITH DIFFERENTIAL     Status: Abnormal   Collection Time: 03/11/14 10:20 AM  Result Value Ref Range   WBC 5.2 4.0 - 10.5 K/uL   RBC 3.90 3.87 - 5.11 MIL/uL   Hemoglobin 12.2 12.0 - 15.0 g/dL   HCT 37.5 36.0 - 46.0 %   MCV 96.2 78.0 - 100.0 fL   MCH 31.3 26.0 - 34.0 pg   MCHC 32.5 30.0 - 36.0 g/dL   RDW 13.0 11.5 - 15.5 %   Platelets 179 150 - 400 K/uL   Neutrophils Relative % 66 43 - 77 %   Neutro Abs 3.5 1.7 - 7.7 K/uL   Lymphocytes Relative 17 12 - 46 %   Lymphs Abs 0.9 0.7 - 4.0 K/uL   Monocytes  Relative 14 (H) 3 - 12 %   Monocytes Absolute 0.7 0.1 - 1.0 K/uL   Eosinophils Relative 2 0 - 5 %   Eosinophils Absolute 0.1 0.0 - 0.7 K/uL   Basophils Relative 1 0 - 1 %   Basophils Absolute 0.0 0.0 - 0.1 K/uL  Comprehensive metabolic panel     Status: Abnormal   Collection Time: 03/11/14 10:20 AM  Result Value Ref Range   Sodium 144 137 - 147 mEq/L   Potassium 4.1 3.7 - 5.3 mEq/L   Chloride 107 96 - 112 mEq/L   CO2 27 19 - 32 mEq/L   Glucose, Bld 82 70 - 99 mg/dL   BUN 24 (H) 6 - 23 mg/dL   Creatinine, Ser 0.53 0.50 - 1.10 mg/dL   Calcium 9.3 8.4 - 10.5 mg/dL   Total Protein 6.0 6.0 - 8.3 g/dL   Albumin 3.2 (L) 3.5 - 5.2 g/dL   AST 19 0 - 37 U/L    Comment: HEMOLYSIS AT THIS LEVEL MAY AFFECT RESULT   ALT 17 0 - 35 U/L   Alkaline Phosphatase 60 39 - 117 U/L   Total Bilirubin <0.2 (L) 0.3 - 1.2 mg/dL   GFR calc  non Af Amer >90 >90 mL/min   GFR calc Af Amer >90 >90 mL/min    Comment: (NOTE) The eGFR has been calculated using the CKD EPI equation. This calculation has not been validated in all clinical situations. eGFR's persistently <90 mL/min signify possible Chronic Kidney Disease.    Anion gap 10 5 - 15  Ethanol     Status: None   Collection Time: 03/11/14 10:20 AM  Result Value Ref Range   Alcohol, Ethyl (B) <11 0 - 11 mg/dL    Comment:        LOWEST DETECTABLE LIMIT FOR SERUM ALCOHOL IS 11 mg/dL FOR MEDICAL PURPOSES ONLY   Salicylate level     Status: Abnormal   Collection Time: 03/11/14 10:20 AM  Result Value Ref Range   Salicylate Lvl <3.7 (L) 2.8 - 20.0 mg/dL  Acetaminophen level     Status: None   Collection Time: 03/11/14 10:20 AM  Result Value Ref Range   Acetaminophen (Tylenol), Serum <15.0 10 - 30 ug/mL    Comment:        THERAPEUTIC CONCENTRATIONS VARY SIGNIFICANTLY. A RANGE OF 10-30 ug/mL MAY BE AN EFFECTIVE CONCENTRATION FOR MANY PATIENTS. HOWEVER, SOME ARE BEST TREATED AT CONCENTRATIONS OUTSIDE THIS RANGE. ACETAMINOPHEN CONCENTRATIONS >150  ug/mL AT 4 HOURS AFTER INGESTION AND >50 ug/mL AT 12 HOURS AFTER INGESTION ARE OFTEN ASSOCIATED WITH TOXIC REACTIONS.   Lipase, blood     Status: None   Collection Time: 03/11/14 10:20 AM  Result Value Ref Range   Lipase 28 11 - 59 U/L  Drug screen panel, emergency     Status: None   Collection Time: 03/11/14  2:12 PM  Result Value Ref Range   Opiates NONE DETECTED NONE DETECTED   Cocaine NONE DETECTED NONE DETECTED   Benzodiazepines NONE DETECTED NONE DETECTED   Amphetamines NONE DETECTED NONE DETECTED   Tetrahydrocannabinol NONE DETECTED NONE DETECTED   Barbiturates NONE DETECTED NONE DETECTED    Comment:        DRUG SCREEN FOR MEDICAL PURPOSES ONLY.  IF CONFIRMATION IS NEEDED FOR ANY PURPOSE, NOTIFY LAB WITHIN 5 DAYS.        LOWEST DETECTABLE LIMITS FOR URINE DRUG SCREEN Drug Class       Cutoff (ng/mL) Amphetamine      1000 Barbiturate      200 Benzodiazepine   902 Tricyclics       409 Opiates          300 Cocaine          300 THC              50   Urinalysis, Routine w reflex microscopic     Status: None   Collection Time: 03/11/14  2:12 PM  Result Value Ref Range   Color, Urine YELLOW YELLOW   APPearance CLEAR CLEAR   Specific Gravity, Urine 1.014 1.005 - 1.030   pH 6.5 5.0 - 8.0   Glucose, UA NEGATIVE NEGATIVE mg/dL   Hgb urine dipstick NEGATIVE NEGATIVE   Bilirubin Urine NEGATIVE NEGATIVE   Ketones, ur NEGATIVE NEGATIVE mg/dL   Protein, ur NEGATIVE NEGATIVE mg/dL   Urobilinogen, UA 0.2 0.0 - 1.0 mg/dL   Nitrite NEGATIVE NEGATIVE   Leukocytes, UA NEGATIVE NEGATIVE    Comment: MICROSCOPIC NOT DONE ON URINES WITH NEGATIVE PROTEIN, BLOOD, LEUKOCYTES, NITRITE, OR GLUCOSE <1000 mg/dL.   Labs are reviewed and are pertinent for negative for critical lab values, CT imaging of head wnl and  EKG without ischemic  changes  Current Facility-Administered Medications  Medication Dose Route Frequency Provider Last Rate Last Dose  . acetaminophen (TYLENOL) tablet 650 mg   650 mg Oral Q4H PRN Mercedes Strupp Camprubi-Soms, PA-C      . albuterol (PROVENTIL HFA;VENTOLIN HFA) 108 (90 BASE) MCG/ACT inhaler 2 puff  2 puff Inhalation Q6H PRN Mercedes Strupp Camprubi-Soms, PA-C      . alum & mag hydroxide-simeth (MAALOX/MYLANTA) 200-200-20 MG/5ML suspension 30 mL  30 mL Oral PRN Mercedes Strupp Camprubi-Soms, PA-C      . clonazePAM (KLONOPIN) tablet 0.5 mg  0.5 mg Oral Q8H PRN Mercedes Strupp Camprubi-Soms, PA-C      . diphenhydrAMINE (BENADRYL) capsule 25 mg  25 mg Oral Q6H PRN Fredia Sorrow, MD   25 mg at 03/11/14 2255  . guaiFENesin (MUCINEX) 12 hr tablet 600 mg  600 mg Oral BID Johnna Acosta, MD   600 mg at 03/11/14 2255  . haloperidol (HALDOL) tablet 2 mg  2 mg Oral QHS Mercedes Strupp Camprubi-Soms, PA-C   2 mg at 03/11/14 2255  . ibuprofen (ADVIL,MOTRIN) tablet 600 mg  600 mg Oral Q8H PRN Mercedes Strupp Camprubi-Soms, PA-C      . mirtazapine (REMERON) tablet 15 mg  15 mg Oral QHS Mercedes Strupp Camprubi-Soms, PA-C   15 mg at 03/11/14 2255  . nicotine (NICODERM CQ - dosed in mg/24 hours) patch 21 mg  21 mg Transdermal Daily Mercedes Strupp Camprubi-Soms, PA-C   21 mg at 03/11/14 2238  . ondansetron (ZOFRAN) tablet 4 mg  4 mg Oral Q8H PRN Mercedes Strupp Camprubi-Soms, PA-C      . tiotropium [facility]) inhalation capsule 18 mcg  18 mcg Inhalation Daily Mercedes Strupp Camprubi-Soms, PA-C   Stopped at 03/11/14 1411  . zolpidem (AMBIEN) tablet 5 mg  5 mg Oral QHS PRN Johnna Acosta, MD       Current Outpatient Prescriptions  Medication Sig Dispense Refill  . acetaminophen (TYLENOL) 325 MG tablet Take 325 mg by mouth every 6 (six) hours as needed (pain/headache).    . clonazePAM (KLONOPIN) 0.5 MG tablet Take 0.5 mg by mouth every 8 (eight) hours as needed for anxiety.   0  . [facility] ALLERGY 25 MG tablet Take 25 mg by mouth at bedtime.  0  . Fluticasone-Salmeterol (ADVAIR) 250-50 MCG/DOSE AEPB Inhale 1 puff into the lungs 2 (two) times daily.    Marland Kitchen guaifenesin (HUMIBID E)  400 MG TABS tablet Take 400 mg by mouth every 6 (six) hours as needed (congestion).    . haloperidol (HALDOL) 2 MG tablet Take 2 mg by mouth at bedtime.  0  . mirtazapine (REMERON) 15 MG tablet Take 15 mg by mouth at bedtime.  0  . PROAIR HFA 108 (90 BASE) MCG/ACT inhaler Inhale 2 puffs into the lungs every 6 (six) hours as needed for shortness of breath.     . tiotropium (SPIRIVA) 18 MCG inhalation capsule Place 18 mcg into inhaler and inhale daily.      Psychiatric Specialty Exam:     Blood pressure 122/46, pulse 98, temperature 98 F (36.7 C), temperature source Oral, resp. rate 16, height '5\' 2"'  (1.575 m), weight 37.337 kg (82 lb 5 oz), SpO2 95 %.Body mass index is 15.05 kg/(m^2).  General Appearance: Casual  Eye Contact::  Good  Speech:  Normal Rate  Volume:  Normal  Mood:  Negative  Affect:  Appropriate  Thought Process:  Circumstantial  Orientation:  Full (Time, Place, and Person)  Thought Content:  Negative  Suicidal Thoughts:  No  Homicidal Thoughts:  No  Memory:  Immediate;   Good  Judgement:  Good  Insight:  Good  Psychomotor Activity:  Negative  Concentration:  Good  Recall:  Good  Akathisia:  Negative  Handed:  Right  AIMS (if indicated):     Assets:  Communication Skills Desire for Improvement Social Support  Sleep:      Treatment Plan Summary: Patient is not meeting IP criteria for crises mgmt, safety and or stabilization for MDD (recurrent severe) Recommend proceed with scheduling out patient follow up with psychiatric provider of their choosing for continued medical mgmt of long term benzodiazepine usage for treatment of GAD. These recommendations for disposition were shared with the EDP who concurs and will d/c the patient home.  Disposition: Disposition Initial Assessment Completed for this Encounter: Yes Disposition of Patient: Other dispositions Other disposition(s):  (Pending psych disposition,. )  SIMON,SPENCER E 03/11/2014 11:49 PM  Agree with  above

## 2014-03-11 NOTE — BH Assessment (Signed)
Per Heloise Purpura, patient meets criteria for inpatient hospitalization.

## 2014-03-12 NOTE — ED Provider Notes (Signed)
Patient cleared by [facility] for discharge home. No significant psychiatric problems warranting admission. Patient does have behavioral health follow-up. Patient not suicidal.  Fredia Sorrow, MD 03/12/14 0005

## 2014-03-12 NOTE — Discharge Instructions (Signed)
Follow-up with your doctor. Cleared by behavioral health to go home. Resource guide provided if needed. Return for any new or worse symptoms.    Emergency Department Resource Guide 1) Find a Doctor and Pay Out of Pocket Although you won't have to find out who is covered by your insurance plan, it is a good idea to ask around and get recommendations. You will then need to call the office and see if the doctor you have chosen will accept you as a new patient and what types of options they offer for patients who are self-pay. Some doctors offer discounts or will set up payment plans for their patients who do not have insurance, but you will need to ask so you aren't surprised when you get to your appointment.  2) Contact Your Local Health Department Not all health departments have doctors that can see patients for sick visits, but many do, so it is worth a call to see if yours does. If you don't know where your local health department is, you can check in your phone book. The CDC also has a tool to help you locate your state's health department, and many state websites also have listings of all of their local health departments.  3) Find a [facility] If your illness is not likely to be very severe or complicated, you may want to try a walk in clinic. These are popping up all over the country in pharmacies, drugstores, and shopping centers. They're usually staffed by nurse practitioners or physician assistants that have been trained to treat common illnesses and complaints. They're usually fairly quick and inexpensive. However, if you have serious medical issues or chronic medical problems, these are probably not your best option.  No Primary Care Doctor: - Call Health Connect at  [phone] - they can help you locate a primary care doctor that  accepts your insurance, provides certain services, etc. - Physician Referral Service- [phone]  Chronic Pain Problems: Organization          Address  Phone   Notes  [facility]  [phone] Patients need to be referred by their primary care doctor.   Medication Assistance: Organization         Address  Phone   Notes  [facility] Medication [facility] [address] [place], [place] [address] [phone] --Must be a resident of [facility] -- Must have NO insurance coverage whatsoever (no Medicaid/ Medicare, etc.) -- The pt. MUST have a primary care doctor that directs their care regularly and follows them in the community   MedAssist  [phone]   [employer]  [phone]    Agencies that provide inexpensive medical care: Organization         Address  Phone   Notes  [place]  [phone]   [facility]    [phone]   [facility] [place], [place] [address] [phone]   [place] [address] [place] [phone]   Planned Parenthood    [phone]   [facility]    [phone]   [place] and [place] [address], [place] Phone:  [phone], Fax:  [phone] Hours of Operation:  9 am - 6 pm, M-F.  Also accepts Medicaid/Medicare and self-pay.  [facility] for [address], [employer] Phone: [phone], Fax: [phone]. Hours of Operation:  8:30 am - 5:30 pm, M-F.  Also accepts Medicaid and self-pay.  [facility] [place] [address], [place] Phone: [phone]   [place], [place], [place] [phone], Ext. [phone] Mondays & Thursdays: 7-9 AM.  First 15 patients are seen on a first come, first serve basis.    [place] Providers:  Organization         Address  Phone   Notes  [facility] [address], [place] [phone] Also accepts self-pay patients.  [facility] [address], [place]  [phone]   [place], [address], [place] [phone]   [facility] [address] [place] [phone]   Lucianne Lei [address], [place]   [phone] Only accepts [place] Access [place] patients after they have their name applied to their card.   Self-Pay (no insurance) in [facility]:  Organization         Address  Phone   Notes  Sickle Cell Patients, [facility] [place] [phone]   [facility] [place] [phone]   [facility] [place]  [place], [place], [place] [phone]   [facility]/Dr. Osei-Bonsu  [address] [place] or [address], [place] [phone] Phone number for both [place] and [place] locations is the same.  Urgent Medical and [facility] [address], [place] [phone]   [facility] [address] [place] or [address] [phone]   [facility] [address] [place] [phone], phone; [phone], fax Sees patients 1st and 3rd Saturday of every month.  Must not qualify for public or private insurance (i.e. Medicaid, Medicare, [place] Health Choice, Veterans' Benefits)  Household income should be no more than 200% of the poverty level The clinic cannot treat you if you are pregnant or think you are pregnant  Sexually transmitted diseases are not treated at the clinic.    Dental Care: Organization         Address  Phone  Notes  [facility] Department of [facility] [place] [phone] Accepts children up to age 28 who are enrolled in [place] or [place]; pregnant women with a Medicaid card; and  children who have applied for Medicaid or [place] Health Choice, but were declined, whose parents can pay a reduced fee at time of service.  [facility] Department of [facility]  [address], [place] [phone] Accepts children up to age 15 who are enrolled in [place] or [place]; pregnant women with a Medicaid card; and children who have applied for Medicaid or [place] Health Choice, but were declined, whose parents can pay a reduced fee at time of service.  [place] Adult [facility] Access PROGRAM  [place] [phone] Patients are seen by appointment only. Walk-ins are not accepted. [place] will see patients 11 years of age and older. Monday - Tuesday (8am-5pm) Most Wednesdays (8:30-5pm) $30 per visit, cash only  [place] [facility] Access PRO[facility]  [address],  [place] [phone] Patients are seen by appointment only. Walk-ins are not accepted. [place] will see patients 54 years of age and older. One Wednesday Evening (Monthly: Volunteer Based).  $30 per visit, cash only  [place]  [phone] for adults; Children under age 55, call Graduate Pediatric Dentistry at [phone]. Children aged 23-14, please call [phone] to request a pediatric application.  Dental services are provided in all areas of dental care including fillings, crowns and bridges, complete and partial dentures, implants, gum treatment, root canals, and extractions. Preventive care is also provided. Treatment is provided to both adults and children. Patients are selected via a lottery and there is often a waiting list.   [facility] [address] [place]  [phone] [email or web address]   [facility] [address] [place], [place] [phone], Ext. [phone] Second and Fourth Thursday of each month, opens at 6:30 AM; Clinic ends at 9 AM.  Patients are seen on a first-come first-served  basis, and a limited number are seen during each clinic.   [facility]  [address] [place]er [place], [place] [phone]   Eligibility Requirements You must have lived in [place], [place], or [place] counties for at least the last three months.   You cannot be eligible for state or federal sponsored [employer], including [employer], [place], or [employer].   You generally cannot be eligible for healthcare insurance through your employer.    How to apply: Eligibility screenings are held every Tuesday and Wednesday afternoon from 1:00 pm until 4:00 pm. You do not need an appointment for the interview!  [facility] [address] [place], [place]   [place]  [place] Department  [place]  [phone]    Behavioral Health Resources in the Community: Intensive Outpatient Programs Organization         Address  Phone  Notes  [place]. [address] [place], [place] [phone]   [facility] Outpatient [address], [place], [place]   ADS: Alcohol & Drug Svcs [address], [place], [place]   [place] [address],  [place], [place] or [phone]   Substance Abuse Resources Organization         Address  Phone  Notes  Alcohol and Drug Services  [phone]   [place]  [phone]   [facility] [place]   Chinita Pester  [phone]   Residential & Outpatient Substance Abuse Program  [phone]   Psychological Services Organization         Address  Phone  Notes  [facility] [place]  [place]  [phone]   [place] [address], [place] [employer] or [phone]    Mobile Crisis Teams Organization          Address  Phone  Notes  [facility], Mobile Crisis Care Unit  [phone]   [facility]  [address] [place], [place]   [place] Levels [address], [place] [phone]    Self-Help/Support Groups Organization         Address  Phone             Notes  [place]. of [place] - variety of support  groups  336- [phone] Call for more information  Narcotics Anonymous (NA), [facility] [address], [employer] [place]  2 meetings at this location   Residential Facilities manager         Address  Phone  Notes  [facility] [place],    [place]  [phone]   [facility]  [address], [place] [address], [place], [place]   [place], [place] [phone] Admissions: 8am-3pm M-F  Incentives Substance [place] [address],    [place], [place] [phone]   [facility] [address] [place], [place], [place]   [facility] [address],  [place], [place]   [facility] Programs - Intensive Outpatient [address]een [address], [place], [place]   [facility] ([place] [address]  [place], [place] [phone] or [phone]   [facility] Services (RTS) [address] [place], [place] Accepts Medicaid  [facility] [place] [address],  [place] [phone] Substance Abuse/Addiction Treatment   [facility] Organization         Address  Phone  Notes  [facility]  [phone]   Domenic Schwab, PhD [address] Arlis Porta [place], [place]   [phone] or [phone]   [place], [place] [phone]   [facility] [address] [place], [place] [phone] Insurance/Medicaid/sponsorship  through [facility] and Families [address] [place]                                    [place], [place] [phone] [place] [address], [place] [phone]    Dr. Adele Schilder  [phone]   [facility] Dept. 1) [address], [place] 2) [place] 3)  [address], [place] [phone]  [phone]   [place] [phone] or [phone] (After Hours)

## 2014-08-02 NOTE — H&P (Signed)
PATIENT NAME:  Melissa George, Melissa George MR#:  [id number] DATE OF BIRTH:  [birth year]  DATE OF ADMISSION:  01/22/2014  REFERRING PHYSICIAN:  Ahmed Prima, MD.  PRIMARY CARE DOCTOR: Nonlocal.   ADMISSION DIAGNOSIS:  Chronic obstructive pulmonary disease exacerbation.   HISTORY OF PRESENT ILLNESS: This is a 64 year old Caucasian female who presents to the Emergency Department complaining of cough x 6 days. The patient initially had productive cough of whitish-yellow sputum but now it is very is dry after completing a course of antibiotics. She had been placed on Levaquin by her urgent care doctor. She also had a sore throat at the beginning of antibiotic course but now does not. She came to the ED tonight because she feels exhausted and she was having some shortness of breath. She complains of some difficulty sleeping due to cough, as well, but she denies any nausea, vomiting or fevers. She admits that the Levaquin she has been taking has given her some diarrhea, but otherwise she feels well. The chest x-ray revealed advanced COPD as well as a nodule on her lung, which prompted the Emergency Department to call for admission. Also, she does not have any inhalers to control her lung disease.   PAST MEDICAL HISTORY: Cardiomyopathy (myocardial infarction 5 years ago due to Takotsubo cardiomyopathy).    PAST SURGICAL HISTORY: Hysterectomy, ovarian torsion resulting in oophorectomy, as well as an appendectomy.   FAMILY HISTORY: Heart attack throughout the family, as well as thyroid cancer and lung cancer.   SOCIAL HISTORY: The patient smokes 1-2 packs per day and has done so for the last 43 years. She denies alcohol or drug use. She lives alone.   MEDICATIONS:  Xanax 0.25 mg 1 tablet p.o. b.i.d. as needed for anxiety.   MEDICINE ALLERGIES: CIPROFLOXACIN, MORPHINE, SULFA DRUGS.   PERTINENT LABORATORY RESULTS AND RADIOGRAPHIC FINDINGS:  Serum glucose 98, BUN 23, creatinine 0.59, sodium 133, potassium is 4,  chloride is 94, bicarbonate 32, calcium is 9.1, serum albumin is 3.9, alkaline phosphatase 68, AST 59, AST 48, creatinine kinase is 657, CK-MB is 40.3, but troponin is negative. Thyroid stimulating hormone 0.778. White blood cell count 11.8, hemoglobin 15.7, hematocrit 46.5.   Chest x-ray shows marked hyperexpansion of the lungs without acute cardiopulmonary disease. There is also a mass-like prominence in the left hilum as well as enlargement of a 1.1 cm right upper lobe nodule.   PHYSICAL EXAMINATION:  VITAL SIGNS: Temperature is 97.8, pulse 90, respirations 26, pulse is 127/66, pulse oximetry is 91% on 2 liters of oxygen via nasal cannula.  GENERAL: The patient is alert and oriented x 3 in no apparent distress.  HEENT: Normocephalic, atraumatic. Pupils equal, round, and reactive to light and accommodation. Extraocular movements are intact. Mucous membranes are moist.  NECK: Trachea is midline. No adenopathy.  CHEST: Symmetric, atraumatic.  CARDIOVASCULAR: Regular rate and rhythm. Normal S1, S2. No rubs, clicks, or murmurs appreciated.  LUNGS: Clear to auscultation bilaterally. There is a prolonged expiratory phase.  ABDOMEN: Positive bowel sounds. Soft, nontender, nondistended. No hepatosplenomegaly.  GENITOURINARY: Deferred.  MUSCULOSKELETAL: The patient moves all 4 extremities equally. There is 5/5 strength in upper and lower extremities bilaterally.  SKIN: No rashes or lesions.  EXTREMITIES: No clubbing, cyanosis, or edema.  NEUROLOGIC: Cranial nerves II-XII are grossly intact.  PSYCHIATRIC: Mood and affect are congruent.   ASSESSMENT AND PLAN: This is a 64 year old female admitted for acute on chronic respiratory failure with hypoxia.  1.  Acute on chronic respiratory failure secondary to  chronic obstructive pulmonary disease exacerbation. The patient was initially hypoxic in the low 80s without oxygen. With the nasal cannula in place her oxygen saturation is now in the low 90s, which is  an appropriate range for patients with chronic obstructive pulmonary disease. She has recently completed a 10-day course of Levaquin. Even though she has a leukocytosis at this time I do not think it is necessary to continue antibiotics.  I will start her on Advair as well as DuoNebs. I have started a taper of steroids and we will aim to keep her oxygen saturations between 88% and 92%.  2.  Sepsis. The patient technically meets criteria by heart rate, respirations and leukocytosis, but I think that all of these things are secondary to her cough and stress demargination. At this time, I will not start the patient on antibiotics. If she becomes febrile we will obtain blood and urine cultures and treat her empirically either for community-acquired pneumonia or with antibiotics for chronic obstructive pulmonary disease exacerbation.  3.  Lung nodule. This needs better characterization. Recommend ordering CT of the chest with potential biopsy, as well, as this could be concerning for malignancy.  4.  Cardiomyopathy. The patient has no chest pain but she does have a history of Takotsubo cardiomyopathy. There is a question of some coronary artery disease but at this time she is stable and chest pain-free. Her troponin is also negative. She is not on any medical management for heart disease at this time and so we will check her lipids and TSH.  5.  Tobacco abuse. I started the patient on a Nicoderm patch.  6.  Malnutrition. BMI is 14.8. The patient cachectic. This could be due to increased metabolic demand due to chronic increased work of breathing or it could be due to malignancy. We will encourage p.o. intake.  7.  DVT prophylaxis. Heparin.  8.  GI prophylaxis is not needed as the patient is not critically ill.   CODE STATUS: The patient is a full code.   Time spent on admission orders and patient care: Approximately 35 minutes.    ____________________________ Norva Riffle. Marcille Blanco, MD msd:lt D: 01/22/2014  06:17:57 ET T: 01/22/2014 08:04:22 ET JOB#: [id number]  cc: Norva Riffle. Marcille Blanco, MD, <Dictator> Norva Riffle Jalexia Lalli MD ELECTRONICALLY SIGNED 01/31/2014 7:33

## 2014-08-02 NOTE — Discharge Summary (Signed)
PATIENT NAME:  Melissa George, Melissa George MR#:  [id number] DATE OF BIRTH:  [birth year]  DATE OF ADMISSION:  01/21/2014 DATE OF DISCHARGE:  01/24/2014  DISCHARGE DIAGNOSES:  1. Acute respiratory failure.  2. Chronic obstructive pulmonary disease exacerbation.  3. Tobacco abuse.  4. Right upper lobe lung scarring, stable from CT of 2011 compared.  5. 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  6. Malnutrition.  7. Hyponatremia.  8. Depression.  9. Anxiety.   IMAGING STUDIES DONE: Include a chest x-ray which showed a right upper lobe mass suspicious for cancer.     CT scan of the chest with contrast showed stable right upper lobe spiculated scarring area, stable compared to 2011. No other infiltrate or effusion found.   CONSULTS: None.   ADMITTING HISTORY AND PHYSICAL: Please see detailed H and P dictated previously. In brief a 64 year old female patient with history of tobacco abuse, COPD,  who presented to the hospital complaining of worsening shortness of breath, cough, was admitted to hospitalist service for COPD exacerbation.   HOSPITAL COURSE:   1.  Acute respiratory failure with COPD exacerbation. The patient was started on IV steroids, nebulizers, and antibiotics with which she has improved well. By the day of discharge the patient is still needing 2 liters oxygen as her saturations were 84% on room air. The patient is back to baseline with her breathing and will be discharged back home. The patient has been given prescriptions for prednisone taper, antibiotics, home inhalers, and DuoNeb p.r.n.  2.  Right upper lobe scarring. This found out with chest x-ray. CT scan of the chest was done which showed stable right upper lobe scarring compared to 2007 which is unlikely cancer at this point.  3.  Malnutrition and depression. The patient does have significant malnutrition, was seen by dietary and given instructions regarding food, also of depression playing a part in it, for  which I have started on 100 mg of trazodone which seems to be titrated or changed to a different medication depending on response by her primary care physician.  4.  Tobacco abuse. The patient counseled to quit smoking during this admission.   Prior to discharge the patient does not have any wheezing on examination. S1, S2 heard without murmurs. No edema. Ambulated well in the hallway and is being discharged home in a fair condition.   DISCHARGE MEDICATIONS:  1. Prednisone 60 mg tapered over 6 days.  2. Xanax 0.25 mg oral 2 times a day as needed.  3. Advair 500/50 one puff inhaled 2 times a day.  4. Nicotine 21 mg transdermal patch daily.  5. Trazodone 100 mg once a day.  6. Spiriva Respimat 2 puffs inhaled once a day.   7. ProAir HFA 2 puffs inhaled 4 times a day as needed.  8. Trazodone 100 mg oral once a day.  9. Ensure 3 times a day with meals.    DISCHARGE INSTRUCTIONS:  Regular food, activity as tolerated. Follow up with Dr. Raul Del of pulmonary and primary care physician in 1-2 weeks. The patient has been requested to quit smoking. Has been [facility] with [facility] to evaluate for home RT on the portable tank.    TIME SPENT ON DAY OF DISCHARGE: 40 minutes.    ____________________________ Leia Alf Chanese Hartsough, MD srs:bu D: 01/27/2014 14:10:18 ET T: 01/27/2014 16:38:16 ET JOB#: [id number]  cc: Alveta Heimlich R. Angeline Trick, MD, <Dictator> Neita Carp MD ELECTRONICALLY SIGNED 02/05/2014 10:44

## 2014-08-02 NOTE — Consult Note (Signed)
Brief Consult Note: Diagnosis: 1. COPD 2. Acute Psychosis 3. Pulmonary Cachexia.  4. hx of Takasubo cardiomyopathy.   Patient was seen by consultant.   Consult note dictated.   Orders entered.   Discussed with Attending MD.   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    1. COPD - no acute exacerbation. Recently discharged from hospital a month ago w/ Prednisone taper and inhalers.  - ?? had acute psychosis from use of Symbicort which happens to be one of it's serious side effects.  - for now would not use Symbicort for maintenance for COPD but advair instead.  - cont. Spiriva, PRN Duonebs.  - cont. O2 2L [place].    2. Acute Psychosis - now resolved.  Suspsected to be due to use of Symbicort.  - cont. care as per Psych.  3. Anxiety - cont. Remeron, Klonopin.   4. hx of Takasubo Cardiomyopathy - clinically not in CHF.    thanks for the consult. Will sign off and please call back if any further help needed.   Job # [id number].  Electronic Signatures: Henreitta Leber (MD)  (Signed [phone] 10:53)  Authored: Brief Consult Note   Last Updated: 25-Nov-15 10:53 by Henreitta Leber (MD)

## 2014-08-02 NOTE — Consult Note (Signed)
PATIENT NAME:  Melissa George, FORTENBERRY MR#:  [id number] DATE OF BIRTH:  Oct 16, 1950  DATE OF CONSULTATION:  03/03/2014  REFERRING PHYSICIAN:  Debbrah Alar, MD CONSULTING PHYSICIAN:  Cordelia Pen. Gretel Acre, MD  REASON FOR CONSULTATION: Stress and migraine.   HISTORY OF PRESENT ILLNESS: The patient is a 64 year old, frail, white female who presented to the ED reporting stress. She reported that she has been having increased anxiety, depression, and stress. She reported that she has been stressed out for the past couple of weeks and has been having stress related to her job. She stated that her job is conducting an investigation and she feels that they are trying to blame her for her mistakes. She works as a Curator.   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 The patient reported that it all started after she was hospitalized for 4 days due to COPD. Since she was discharged from the hospital, everybody seems different and everybody has been acting strange. She reported that nobody is telling her what is going on, but they are looking at her differently. She reported that she does not feel comfortable going to work. The patient reported that she was given medications including prednisone taper and now she has completed taking the medication. However, she stated that she was also given a medication to help with her sleep, but she stopped the trazodone because it was giving her nightmares. The patient reported that now she has started hearing voices. People are talking to her and she has been having visual hallucinations occasionally. She only sleeps 3 to 4 hours per night. The patient reported that she is unable to eat anything. The patient reported that she does not take any medications for depression or for anxiety. However, she feels scared most of the time  and she feels that people are after her and there is no telling. She appeared visibly distressed and anxious during the interview. She reported that she came to the hospital because of breathing issues, but now that they are resolved, she is becoming very anxious and is stressed that something is going to happen to her since she has been living alone. She has 2 sons and they live in [place] and [place], respectively. She currently denies having any suicidal plans, but reported that she would be better off dead.   PAST PSYCHIATRIC HISTORY: The patient reported that she has never been admitted to a psychiatric facility, but has other admissions related to her COPD. The patient was recently given a taper of 60 mg of prednisone and she has completed the medication.   SUBSTANCE ABUSE HISTORY: The patient denies using any drugs or alcohol at this time. She smokes a pack of cigarettes per day.   MEDICAL HISTORY: The patient has a history of COPD; acute respiratory failure; right upper lobe lung scarring, stable since 2011; nonischemic cardiomyopathy; malnutrition; hyponatremia.   CURRENT MEDICATIONS: Xanax 0.25 mg b.i.d. as needed, Advair 500/50 two times daily, Spiriva 2 puffs inhaled once a day, ProAir 2 puffs inhaled 4 times a day as needed, Ensure 3 times a day with meals.   SOCIAL HISTORY: The patient currently lives by herself. She has been working in [employer] for the past 9 years. She has 2 sons. They live in [place] and [place], respectively. She drives  to work by herself. Her father lives next door.   REVIEW OF SYSTEMS:  CONSTITUTIONAL: The patient currently denies any fever or chills. No weight changes.  EYES: No double or blurred vision.  RESPIRATORY: No shortness of breath or cough.  CARDIOVASCULAR: No chest pain or orthopnea.  GASTROINTESTINAL: No abdominal pain, nausea, vomiting, diarrhea.  GENITOURINARY: No incontinence or frequency.  ENDOCRINE: No heat or cold intolerance.   LYMPHATIC: No anemia or easy bruising.  INTEGUMENTARY: No acne or rash.  MUSCULOSKELETAL: No muscle or joint pain.   MENTAL STATUS EXAMINATION: The patient is a cachectic-appearing female who was sitting in the bed. She appeared disheveled. Her gait and station appear normal. Speech was low in tone and volume. She appeared very anxious, depressed, and has paucity of speech. Thought process was tangential. Loose associations are noted. She appeared to have hallucinations with paranoia noted. She has passive suicidal ideations. Her insight and judgment were poor. She was awake, alert, and oriented. Recent and remote memory were fair. Attention span and concentration were poor. Mood was depressed and anxious and affect was congruent.   DIAGNOSTIC IMPRESSION:  AXIS I: Psychotic disorder, not otherwise specified. Rule out medication-induced paranoia.  AXIS II: None reported.  AXIS III: As noted above.   TREATMENT PLAN:  1.  The patient will be admitted to the inpatient behavioral health unit for stabilization and safety.  2.  I will start her on Haldol 0.5 mg p.o. b.i.d. for paranoia.  3.  I will also start her on mirtazapine 15 mg at bedtime for her depressive symptoms.  4.  I will also start her Ensure 3 times daily and will also place a dietitian consult due to her weight loss.  Treatment team to follow.  Thank you for allowing me to participate in the care of this patient.    ____________________________ Cordelia Pen. Gretel Acre, MD usf:ah D: 03/03/2014 12:06:21 ET T: 03/03/2014 12:16:35 ET JOB#: [id number]  cc: Cordelia Pen. Gretel Acre, MD, <Dictator> Jeronimo Norma MD ELECTRONICALLY SIGNED 03/04/2014 9:20

## 2014-08-02 NOTE — Consult Note (Signed)
PATIENT NAME:  Melissa George, RADICH MR#:  [id number] DATE OF BIRTH:  28-Jan-1951  DATE OF CONSULTATION:  03/05/2014  REFERRING PHYSICIAN:  Dr. Jerilee Hoh.  CONSULTING PHYSICIAN:  Vivek J. Verdell Carmine, MD  PRIMARY CARE PHYSICIAN: Not known.   REASON FOR CONSULTATION: COPD management.   HISTORY OF PRESENT ILLNESS: This is a 64 year old female who presents to the hospital admitted to behavioral medicine due to acute psychosis. Hospitalist services were contacted for management of her COPD. The patient was recently discharged about a month ago from the hospitalist service due to a COPD exacerbation. At that point, she was discharged on a prednisone taper and also maintenance inhalers including Advair, albuterol as needed and Spiriva. When she went to get her prescriptions filled, Advair was too expensive so therefore she was switched over to Symbicort. The patient says that his since she has been taking her Symbicort she has not felt like herself. She apparently shortly after taking Symbicort would get a bit confused and as per her neighbors would not like act herself. The patient presented to the hospital with an acute psychosis episode and therefore was admitted to behavioral medicine. She has no previous psychiatric history other than just some mild anxiety. Hospitalist services were contacted as there was some concern that the Symbicort was causing the acute psychosis. The patient presently denies any shortness of breath, any cough, any nausea, vomiting or fevers. Positive chills but no other associated symptoms.   PAST MEDICAL HISTORY: Consistent with history of takotsubo cardiomyopathy, history of anxiety, COPD with ongoing tobacco abuse.   PAST SURGICAL HISTORY: Consistent with a hysterectomy and ovarian torsion resulting in oophorectomy as well as appendectomy.   FAMILY HISTORY: Consistent with heart disease in her family as well as thyroid cancer and lung cancer.   SOCIAL HISTORY: She still smokes about  1 to 2 packs per day and has been smoking for 40+ years. No alcohol abuse. No illicit drug abuse. She lives by herself.   CURRENT MEDICATIONS: As follows: Advair 500/50 one puff b.i.d., albuterol inhaler as needed, Spiriva 1 puff daily, Klonopin 0.5 mg q.8 hours as needed, Remeron 15 mg at bedtime, Haldol 2 mg at bedtime as needed and diphenhydramine 25 mg at bedtime.   ALLERGIES: CIPRO, MORPHINE AND SULFA DRUGS.   PHYSICAL EXAMINATION: Presently as follows:  VITAL SIGNS: Temperature 98.3, pulse 101, respirations 20, blood pressure 117/63, saturations  98% on room air.  GENERAL: She is a pleasant-appearing female, in no apparent distress.  HEENT: She is atraumatic, normocephalic. Extraocular muscles are intact. Pupils are reactive to light. Sclerae anicteric. No conjunctival injection. No pharyngeal erythema.  NECK: Supple. There is no jugular venous distention. No bruits. No lymphadenopathy. No  thyromegaly.  HEART: Regular rate and rhythm. No murmurs, no rubs, no clicks.  LUNGS: Clear to auscultation bilaterally. No rales, rhonchi or wheezes.  ABDOMEN: Soft, flat, nontender, nondistended. Has good bowel sounds. No hepatosplenomegaly appreciated.  EXTREMITIES: No evidence of any cyanosis, clubbing or peripheral edema. Has +2 pedal and radial pulses bilaterally.  NEUROLOGIC: The patient is alert and oriented x3 with no focal motor or sensory deficits appreciated bilaterally.  SKIN: Moist and warm with no rashes appreciated.  LYMPHATIC: There is no cervical or axillary lymphadenopathy.   LABORATORY DATA: As of November 22 showed a serum glucose of 92, BUN 22, creatinine 0.7, sodium 141, potassium 3.3, chloride 103, bicarbonate 31. White cell count 8.8, hemoglobin 14.6, hematocrit 42.9, platelet count 259,000. Urinalysis within normal limits.   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  1.   Chronic obstructive pulmonary disease. The patient had no acute chronic obstructive pulmonary disease exacerbation presently. She was recently discharged from the hospital about a month ago with a prednisone taper and inhalers. The patient now presents to the hospital with acute psychosis. There is some concern that the Symbicort caused this. I did look up the side effect profile of Symbicort. One of its severe side effects is behavioral disturbance so there is a possibility that Symbicort could have caused her acute psychosis. For now, I would discontinue her Symbicort. For maintenance for chronic obstructive pulmonary disease, I would use Advair and also use Spiriva for maintenance for her chronic obstructive pulmonary disease. Also use as needed DuoNebs. Continue oxygen supplementation. She probably will need prior authorization to get her Advair filled as she cannot tolerate Symbicort due to its serious side effects. This was discussed with the patient's primary.  2.  Acute psychosis. This has now resolved, suspected to be due to Symbicort. For now, continue care as per psychiatry. The patient is on Haldol, Remeron and Klonopin and is stable.  3.  Anxiety. Continue with the Remeron and Klonopin.  4.  History of takotsubo cardiomyopathy. The patient clinically is not in congestive heart failure.   Thank you so much for the consultation. Call back if any further help needed.   TIME SPENT ON THE CONSULT: 50 minutes.    ____________________________ Belia Heman. Verdell Carmine, MD vjs:JT D: 03/05/2014 10:53:15 ET T: 03/05/2014 11:55:41 ET JOB#: [id number]  cc: Belia Heman. Verdell Carmine, MD, <Dictator> Henreitta Leber MD ELECTRONICALLY SIGNED 03/06/2014 17:23

## 2014-08-19 NOTE — H&P (Signed)
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 "I was paranoid, just crazy" OF PRESENT ILLNESS: The patient is a 64 year old, frail, white female who presented to the ED reporting stress. She reported that she has been having increased anxiety, depression, and stress. She reported that she has been stressed out for the past couple of weeks and has been having stress related to her job. She stated that her job is conducting an investigation and she feels that they are trying to blame her for her mistakes. Patient states that she was admitted to [facility] for 4 days last month due to COPD exacerbation.  Medications changes were made and she was started on a prednisone taper (which she completed a couple of weeks ago). She c/o that since the d/c her vision has been blurry and she started feeling like everybody from work was out to get her "everybody is watching me", "everybody think I did something wrong".  She reports people look different and everybody has been acting strange. Patient reports seeing shadows and hearing "little weird" voices. She has thought about suicide and c/o poor sleep, appetite, energy and concentration.  PSYCHIATRIC HISTORY: The patient reported that she has never been admitted to a psychiatric facility.  Denies h/o suicidal attempts.  Pt takes Xanax 0.25 mg bid which is prescribed by her PCP.  HISTORY: The patient has a history of COPD; right upper lobe lung scarring, stable since 2011; nonischemic cardiomyopathy. MEDICATIONS: Xanax 0.25 mg b.i.d. as needed, Advair 500/50 two times daily, Spiriva 2 puffs inhaled once a day, ProAir 2 puffs inhaled 4 times a day as needed, Ensure 3 times a day with meals.  HISTORY: The patient currently lives by herself. She has been working in [employer] for the past 9 years. She has 2 sons. They live in [place] and [place], respectively. She drives to work by herself. Her father lives next door.  OF SYSTEMS: The patient currently denies any fever or chills. No weight changes. No double or blurred vision. No shortness of breath or cough. No chest pain or orthopnea. No abdominal pain, nausea, vomiting, diarrhea. No incontinence or frequency. No heat or cold intolerance. No anemia or easy bruising. No acne or rash. No muscle or joint pain.  STATUS EXAMINATION: The patient is a cachectic-appearing female who was sitting in the bed. She appeared disheveled. Her gait and station appear normal. Behavior: passive, anxious. Speech was low in tone and volume. Psychomotor activity: retarded. Eye contact: poor. Thought process was linear. Thought content: SI and delusions. She has passive suicidal ideations. Her insight and judgment were poor. She was awake, alert, and oriented. Recent and remote memory were fair. Attention span and concentration were poor. Mood was depressed and anxious and affect was congruent.  EXAM:appearance: cachectic wf in no acute distress.nl muscular tone, gait and no involuntary movements.117/72, R: 20, HR: 83, T: 97.2 IMPRESSION: disorder, not otherwise specified. Rule out medication-induced psychosisright upper lobe lung scarring, stable since 2011; nonischemic cardiomyopathy PLAN:  The patient will be admitted to the inpatient behavioral health unit for stabilization and safety.  I will increase Haldol to 1 mg p.o. t.i.d. for paranoia.  Continue mirtazapine 15 mg at bedtime for her depressive symptoms.  I will also start her Ensure 3 times daily and will also place a dietitian consult due to her weight loss. Benadryl 5  mg po qhs will be added for insomnia and to prevent EPS.Will not restart inhalers other than albuterol prnorder HIV, RPR, B12 and ammoniaorder Brain MRI   Electronic Signatures: Hildred Priest (MD)  (Signed on 24-Nov-15 20:45)  Authored  Last Updated: 24-Nov-15  20:45 by Hildred Priest (MD)

## 2014-09-11 ENCOUNTER — Encounter: Payer: Self-pay | Admitting: *Deleted

## 2014-09-11 ENCOUNTER — Emergency Department
Admission: EM | Admit: 2014-09-11 | Discharge: 2014-09-13 | Disposition: A | Discharge status: Other Acute Inpatient Hospital | Payer: Medicare Other | Attending: Emergency Medicine | Admitting: Emergency Medicine

## 2014-09-11 DIAGNOSIS — F333 Major depressive disorder, recurrent, severe with psychotic symptoms: Secondary | ICD-10-CM | POA: Diagnosis not present

## 2014-09-11 DIAGNOSIS — Z7951 Long term (current) use of inhaled steroids: Secondary | ICD-10-CM | POA: Diagnosis not present

## 2014-09-11 DIAGNOSIS — Z87891 Personal history of nicotine dependence: Secondary | ICD-10-CM | POA: Insufficient documentation

## 2014-09-11 DIAGNOSIS — Z79899 Other long term (current) drug therapy: Secondary | ICD-10-CM | POA: Diagnosis not present

## 2014-09-11 DIAGNOSIS — F329 Major depressive disorder, single episode, unspecified: Secondary | ICD-10-CM | POA: Insufficient documentation

## 2014-09-11 DIAGNOSIS — J449 Chronic obstructive pulmonary disease, unspecified: Secondary | ICD-10-CM | POA: Diagnosis not present

## 2014-09-11 DIAGNOSIS — F23 Brief psychotic disorder: Secondary | ICD-10-CM | POA: Diagnosis not present

## 2014-09-11 DIAGNOSIS — Z046 Encounter for general psychiatric examination, requested by authority: Secondary | ICD-10-CM | POA: Diagnosis present

## 2014-09-11 DIAGNOSIS — R630 Anorexia: Secondary | ICD-10-CM | POA: Diagnosis not present

## 2014-09-11 LAB — COMPREHENSIVE METABOLIC PANEL
ALK PHOS: 57 U/L (ref 38–126)
ALT: 16 U/L (ref 14–54)
AST: 22 U/L (ref 15–41)
Albumin: 4.4 g/dL (ref 3.5–5.0)
Anion gap: 10 (ref 5–15)
BUN: 26 mg/dL — ABNORMAL HIGH (ref 6–20)
CHLORIDE: 105 mmol/L (ref 101–111)
CO2: 28 mmol/L (ref 22–32)
Calcium: 9.6 mg/dL (ref 8.9–10.3)
Creatinine, Ser: 0.68 mg/dL (ref 0.44–1.00)
GFR calc Af Amer: >60 mL/min (ref >60)
Glucose, Bld: 93 mg/dL (ref 65–99)
Potassium: 3.6 mmol/L (ref 3.5–5.1)
Sodium: 143 mmol/L (ref 135–145)
Total Bilirubin: 0.5 mg/dL (ref 0.3–1.2)
Total Protein: 7.2 g/dL (ref 6.5–8.1)

## 2014-09-11 LAB — CBC WITH DIFFERENTIAL/PLATELET
Basophils Absolute: 0.1 10*3/uL (ref 0–0.1)
Basophils Relative: 1 %
EOS ABS: 0.1 10*3/uL (ref 0–0.7)
Eosinophils Relative: 1 %
HCT: 40.8 % (ref 35.0–47.0)
HEMOGLOBIN: 13.6 g/dL (ref 12.0–16.0)
LYMPHS PCT: 16 %
Lymphs Abs: 1.5 10*3/uL (ref 1.0–3.6)
MCH: 31.4 pg (ref 26.0–34.0)
MCHC: 33.4 g/dL (ref 32.0–36.0)
MCV: 94 fL (ref 80.0–100.0)
MONOS PCT: 10 %
Monocytes Absolute: 0.9 10*3/uL (ref 0.2–0.9)
NEUTROS ABS: 6.5 10*3/uL (ref 1.4–6.5)
Neutrophils Relative %: 72 %
Platelets: 211 10*3/uL (ref 150–440)
RBC: 4.34 MIL/uL (ref 3.80–5.20)
RDW: 12.5 % (ref 11.5–14.5)
WBC: 9.1 10*3/uL (ref 3.6–11.0)

## 2014-09-11 MED ORDER — LORAZEPAM 1 MG PO TABS
1.0000 mg | ORAL_TABLET | Freq: Once | ORAL | Status: AC
  Administered 2014-09-11: 1 mg via ORAL

## 2014-09-11 MED ORDER — LORAZEPAM 1 MG PO TABS
ORAL_TABLET | ORAL | Status: AC
  Administered 2014-09-11: 1 mg via ORAL
  Filled 2014-09-11: qty 1

## 2014-09-11 NOTE — ED Notes (Signed)

## 2014-09-11 NOTE — ED Provider Notes (Signed)
[facility] Emergency Department Provider Note  ____________________________________________  Time seen: Approximately 850 PM  I have reviewed the triage vital signs and the nursing notes.   HISTORY  Chief Complaint Psychiatric Evaluation    HPI Melissa George is a 64 y.o. female with a history of psychosis who presents today with worsening symptoms over several weeks since being discharged from [facility] behavioral health. Patient denies any auditory or visual hallucinations however says she is seeing how all around me. Denies any suicidal ideation but when asked if she is having thoughts of hurting others says "my son's will die."  Exam is limited by patient's mental status which is borderline catatonic.   Past Medical History  Diagnosis Date  . Anxiety   . COPD (chronic obstructive pulmonary disease)   . Anxiety   . 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 GAD (generalized anxiety disorder)     Past Surgical History  Procedure Laterality Date  . Hand surgery    . Appendectomy    . Abdominal hysterectomy    . Cesarean section      Current Outpatient Rx  Name  Route  Sig  Dispense  Refill  . acetaminophen (TYLENOL) 325 MG tablet   Oral   Take 325 mg by mouth every 6 (six) hours as needed (pain/headache).         . ALPRAZolam (XANAX) 0.25 MG tablet   Oral   Take 0.25 mg by mouth at bedtime as needed for anxiety (1/2 in am and 1 qhs, then 1/2 bid tablet two times daily as needed).         . clonazePAM (KLONOPIN) 0.5 MG tablet   Oral   Take 0.5 mg by mouth every 8 (eight) hours as needed for anxiety.       0   . [facility] ALLERGY 25 MG tablet   Oral   Take 25 mg by mouth at bedtime.      0   . Fluticasone-Salmeterol (ADVAIR) 250-50 MCG/DOSE AEPB   Inhalation   Inhale 1 puff into the lungs 2 (two) times daily.         Marland Kitchen  guaifenesin (HUMIBID E) 400 MG TABS tablet   Oral   Take 400 mg by mouth every 6 (six) hours as needed (congestion).         . haloperidol (HALDOL) 2 MG tablet   Oral   Take 2 mg by mouth at bedtime.      0   . hydrOXYzine (ATARAX/VISTARIL) 50 MG tablet   Oral   Take 50 mg by mouth 3 (three) times daily as needed.         . mirtazapine (REMERON) 15 MG tablet   Oral   Take 15 mg by mouth at bedtime.      0   . PROAIR HFA 108 (90 BASE) MCG/ACT inhaler   Inhalation   Inhale 2 puffs into the lungs every 6 (six) hours as needed for shortness of breath.          . tiotropium (SPIRIVA) 18 MCG inhalation capsule   Inhalation   Place 18 mcg into inhaler and inhale daily.           Allergies Ciprofloxacin; Levofloxacin; Morphine and related; Sulfa antibiotics; and Symbicort  History reviewed. No pertinent family history.  Social History History  Substance Use  Topics  . Smoking status: Former Research scientist (life sciences)  . Smokeless tobacco: Not on file  . Alcohol Use: No    Review of Systems Constitutional: No fever/chills Eyes: No visual changes. ENT: No sore throat. Cardiovascular: Denies chest pain. Respiratory: Denies shortness of breath. Gastrointestinal: No abdominal pain.  No nausea, no vomiting.  No diarrhea.  No constipation. Genitourinary: Negative for dysuria. Musculoskeletal: Negative for back pain. Skin: Negative for rash. Neurological: Negative for headaches, focal weakness or numbness. Psychiatric:Declining psychiatric status over the past several weeks.  10-point ROS otherwise negative.  ____________________________________________   PHYSICAL EXAM:  VITAL SIGNS: ED Triage Vitals  Enc Vitals Group     BP 09/11/14 2055 159/88 mmHg     Pulse Rate 09/11/14 2055 109     Resp 09/11/14 2055 18     Temp 09/11/14 2055 97.4 F (36.3 C)     Temp Source 09/11/14 2055 Oral     SpO2 09/11/14 2055 93 %     Weight 09/11/14 2055 77 lb (34.927 kg)     Height 09/11/14  2055 '5\' 1"'$  (1.549 m)     Head Cir --      Peak Flow --      Pain Score 09/11/14 2056 5     Pain Loc --      Pain Edu? --      Excl. in [place]? --     Constitutional: Alert and oriented. in no acute distress. Cachectic Eyes: Conjunctivae are normal. PERRL. EOMI. Head: Atraumatic. Nose: No congestion/rhinnorhea. Mouth/Throat: Mucous membranes are moist.  Oropharynx non-erythematous. Neck: No stridor.   Cardiovascular: Normal rate, regular rhythm. Grossly normal heart sounds.  Good peripheral circulation. Respiratory: Normal respiratory effort.  No retractions. Lungs CTAB. Gastrointestinal: Soft and nontender. No distention. No abdominal bruits. No CVA tenderness. Musculoskeletal: No lower extremity tenderness nor edema.  No joint effusions. Neurologic:  Normal speech and language. No gross focal neurologic deficits are appreciated. Speech is normal. No gait instability. Skin:  Mild exfoliation with mild erythema to the skin overlying the left eyebrow and just above on the forehead. There is no induration or pus. The patient is rubbing at this area continuously throughout the exam. Psychiatric: Depressed mood. Strange affect.  ____________________________________________   LABS (all labs ordered are listed, but only abnormal results are displayed)  Labs Reviewed  COMPREHENSIVE METABOLIC PANEL - Abnormal; Notable for the following:    BUN 26 (*)    All other components within normal limits  CBC WITH DIFFERENTIAL/PLATELET  ACETAMINOPHEN LEVEL  SALICYLATE LEVEL  URINALYSIS COMPLETEWITH MICROSCOPIC ([facility] ONLY)  ETHANOL   ____________________________________________  EKG   ____________________________________________  RADIOLOGY   ____________________________________________   PROCEDURES   ____________________________________________   INITIAL IMPRESSION / ASSESSMENT AND PLAN / ED COURSE  Pertinent labs & imaging results that were available during my care of the patient  were reviewed by me and considered in my medical decision making (see chart for details).  Involuntary commitment completed for this patient. Pending psychiatric consultation. ____________________________________________   FINAL CLINICAL IMPRESSION(S) / ED DIAGNOSES  Acute psychosis. Initial visit.    Orbie Pyo, MD 09/12/14 [phone]

## 2014-09-11 NOTE — ED Notes (Signed)
BEHAVIORAL HEALTH ROUNDING Patient sleeping: No. Patient alert and oriented: yes Behavior appropriate: No.; If no, describe: hyperactive with left arm rubbing eyebrow. Pt paranoid about hell and killing sons.  Nutrition and fluids offered: Yes  Toileting and hygiene offered: Yes  Sitter present: no Law enforcement present: Yes

## 2014-09-11 NOTE — ED Notes (Signed)
Pt sleeping soundly in bed. No acute distress noted. Pt no longer rubbing left eyebrow.

## 2014-09-11 NOTE — ED Notes (Signed)
Pt arrived via EMS from home voluntarily with son who reports pt has been declining since being discharged from [facility]. Pt was treated at [facility] for three weeks, pt could not verbalize any diagnosis from inpatient stay. Pt reports having thoughts of hurting self and reports a plan to take all medications. When asked if pt has thoughts of hurting others she said "My sons will die" but did not elaborate past that stating I don't know when asked how they will die. Py denies auditory hallucinations but reports "seeing Hell all around me". Pt rubbing left eyebrow constantly throughout assessment and staring at points throughout the room.

## 2014-09-12 DIAGNOSIS — J449 Chronic obstructive pulmonary disease, unspecified: Secondary | ICD-10-CM | POA: Diagnosis not present

## 2014-09-12 DIAGNOSIS — R630 Anorexia: Secondary | ICD-10-CM

## 2014-09-12 DIAGNOSIS — F333 Major depressive disorder, recurrent, severe with psychotic symptoms: Secondary | ICD-10-CM

## 2014-09-12 LAB — URINALYSIS COMPLETE WITH MICROSCOPIC (ARMC ONLY)
Bacteria, UA: NONE SEEN
Bilirubin Urine: NEGATIVE
Glucose, UA: NEGATIVE mg/dL
Ketones, ur: NEGATIVE mg/dL
Leukocytes, UA: NEGATIVE
NITRITE: NEGATIVE
PROTEIN: NEGATIVE mg/dL
SPECIFIC GRAVITY, URINE: 1.016 (ref 1.005–1.030)
pH: 7 (ref 5.0–8.0)

## 2014-09-12 LAB — SALICYLATE LEVEL: Salicylate Lvl: <4 mg/dL (ref 2.8–30.0)

## 2014-09-12 LAB — ETHANOL: Alcohol, Ethyl (B): <5 mg/dL (ref <5)

## 2014-09-12 LAB — ACETAMINOPHEN LEVEL: Acetaminophen (Tylenol), Serum: <10 ug/mL — ABNORMAL LOW (ref 10–30)

## 2014-09-12 MED ORDER — LORAZEPAM 0.5 MG PO TABS
ORAL_TABLET | ORAL | Status: AC
  Administered 2014-09-12: 0.5 mg via ORAL
  Filled 2014-09-12: qty 1

## 2014-09-12 MED ORDER — QUETIAPINE FUMARATE 25 MG PO TABS
25.0000 mg | ORAL_TABLET | Freq: Two times a day (BID) | ORAL | Status: DC
  Administered 2014-09-12: 25 mg via ORAL
  Filled 2014-09-12: qty 1

## 2014-09-12 MED ORDER — MIRTAZAPINE 30 MG PO TABS
30.0000 mg | ORAL_TABLET | Freq: Every day | ORAL | Status: DC
  Administered 2014-09-12: 30 mg via ORAL
  Filled 2014-09-12 (×2): qty 1

## 2014-09-12 MED ORDER — BUPROPION HCL ER (XL) 150 MG PO TB24
150.0000 mg | ORAL_TABLET | Freq: Every day | ORAL | Status: DC
  Administered 2014-09-12: 150 mg via ORAL
  Filled 2014-09-12 (×2): qty 1

## 2014-09-12 MED ORDER — LORAZEPAM 0.5 MG PO TABS
0.5000 mg | ORAL_TABLET | Freq: Three times a day (TID) | ORAL | Status: DC
  Administered 2014-09-12 (×2): 0.5 mg via ORAL
  Filled 2014-09-12: qty 1

## 2014-09-12 MED ORDER — TIOTROPIUM BROMIDE MONOHYDRATE 18 MCG IN CAPS
18.0000 ug | ORAL_CAPSULE | Freq: Every day | RESPIRATORY_TRACT | Status: DC
  Administered 2014-09-12: 18 ug via RESPIRATORY_TRACT
  Filled 2014-09-12: qty 5

## 2014-09-12 MED ORDER — ALBUTEROL SULFATE (2.5 MG/3ML) 0.083% IN NEBU
3.0000 mL | INHALATION_SOLUTION | Freq: Four times a day (QID) | RESPIRATORY_TRACT | Status: DC | PRN
  Filled 2014-09-12: qty 3

## 2014-09-12 NOTE — ED Notes (Signed)
Pt laying in bed no distress noted

## 2014-09-12 NOTE — ED Notes (Signed)
BEHAVIORAL HEALTH ROUNDING Patient sleeping: No. Patient alert and oriented: yes Behavior appropriate: Yes.  ; If no, describe:  Nutrition and fluids offered: Yes  Toileting and hygiene offered: Yes  Sitter present: yes Law enforcement present: Yes  

## 2014-09-12 NOTE — BHH Counselor (Signed)
Pt. is to be admitted to [facility] by Dr. Weber Cooks. Attending Physician will be Dr. Jerilee Hoh.  Pt. has been assigned to room 319B, by [place].  Intake Paper Work has been signed and placed on pt. chart. ER staff Tye [place], ER Sect, Dr. Kerman Passey ER MD & Raquel RN.) have been made aware of the admission.

## 2014-09-12 NOTE — Consult Note (Signed)
[facility] Consult   Reason for Consult:  Consult for this 64 year old woman with severe major depression with psychotic features and suicidal ideation Referring Physician:  goodman [name] MRN:  [id number] Principal Diagnosis: Severe major depression with psychotic features Diagnosis:   Patient Active Problem List   Diagnosis Date Noted  . 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 Anorexia [R63.0] 09/12/2014  . COPD (chronic obstructive pulmonary disease) [J44.9] 09/12/2014  . 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 GAD (generalized anxiety disorder) [F41.1]     Total Time spent with patient: 1 hour  Subjective:   Melissa George is a 64 y.o. female patient admitted with "my children think I'm crazy". Patient is focused on that his chief complaint but actually gives a pretty clear description of severe depression with paranoia negative psychosis and suicidal ideation.Marland Kitchen  HPI:  Information from the patient and the chart. Patient says that her symptoms started last year around October. She became convinced that people at her work were accusing her of [place]. She admits that she didn't actually have any evidence that anyone was thinking that. He absolutely denies that she was embezzling. She says the worse thing that she ever did was to take a sick day. She became very obsessed with this and became very depressed. Ever since then her mood is been sad down and negative all the time. She has been eating much less and has lost a great deal of weight. She sleeps poorly even with her medication. She has had suicidal thoughts with thoughts that she ought to kill herself because of how miserable she is. No homicidal ideation. Currently she denies that she's having any hallucinations. She says that she is taking medications prescribed by Dr. Jimmye Norman. It sounds like she is taking  Seroquel Ativan and Remeron. Despite this she has not been getting any better. One of her sons has been visiting her from out of town for at least a month trying to get her stabilized but is now getting ready to leave town and she is decompensating. She has been going to see a psychiatrist. No particular stress that has been identified that could've set this off.  Past psychiatric history: Patient had a previous hospitalization here back in November for similar symptoms. She was started on medication then but it seems like she's never really recovered she denies having actually tried to kill herself. She denies that she has abused alcohol. She said that there was a time in the past when she was taking Xanax and her family thought she was abusing it but that has not been the case for years. Not clear that she's ever had major mental health problems prior to all this.  Social history: Patient lives alone. She has 2 adult children who live out of state. 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 Other than her children has very little support thinks that she has very few friends.  Medical history: She has chronic back pain and has obviously lost a lot of weight. History of COPD and is supposed to be using Spiriva.  Substance abuse history: Denies any lifetime alcohol use. Denies any drug use except that she says there was a time when she may have overused Xanax years ago.  Family history: Positive family history for depression suicide and alcohol abuse  Current medication: Seroquel Ativan Remeron Spiriva but she doesn't know the dose of any of them HPI Elements:   Quality:  Depressed mood with delusional thinking. Severity:  Severe. Timing:  Worse recently. Duration:  Overall been going on for about 9-10 months. Context:  Ongoing stress of living alone.  Past Medical History:  Past Medical  History  Diagnosis Date  . Anxiety   . COPD (chronic obstructive pulmonary disease)   . Anxiety   . 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 Hand surgery    . Appendectomy    . Abdominal hysterectomy    . Cesarean section     Family History: History reviewed. No pertinent family history. Social History:  History  Alcohol Use No     History  Drug Use No    History   Social History  . Marital Status: Single    Spouse Name: N/A  . Number of Children: N/A  . Years of Education: N/A   Social History Main Topics  . Smoking status: Former Research scientist (life sciences)  . Smokeless tobacco: Not on file  . Alcohol Use: No  . Drug Use: No  . Sexual Activity: Not on file   Other Topics Concern  . None   Social History Narrative   Additional Social History:                          Allergies:   Allergies  Allergen Reactions  . Ciprofloxacin Shortness Of Breath and Itching  . Levofloxacin Other (See Comments)    Reaction:  Unknown   . Morphine And Related Nausea And Vomiting  . Sulfa Antibiotics Hives and Itching  . Symbicort [Budesonide-Formoterol Fumarate] Other (See Comments)    Reaction:  Psychotic episode  . Advair Diskus [Fluticasone-Salmeterol] Anxiety    Labs:  Results for orders placed or performed during the hospital encounter of 09/11/14 (from the past 48 hour(s))  CBC with Differential     Status: None   Collection Time: 09/11/14 10:17 PM  Result Value Ref Range   WBC 9.1 3.6 - 11.0 K/uL   RBC 4.34 3.80 - 5.20 MIL/uL   Hemoglobin 13.6 12.0 - 16.0 g/dL   HCT 40.8 35.0 - 47.0 %   MCV 94.0 80.0 - 100.0 fL   MCH 31.4 26.0 - 34.0 pg   MCHC 33.4 32.0 - 36.0 g/dL   RDW 12.5 11.5 - 14.5 %   Platelets 211 150 - 440 K/uL   Neutrophils Relative % 72 %   Neutro Abs 6.5 1.4 - 6.5 K/uL   Lymphocytes Relative 16 %   Lymphs Abs 1.5 1.0 - 3.6 K/uL   Monocytes Relative 10 %   Monocytes Absolute 0.9 0.2 - 0.9 K/uL   Eosinophils  Relative 1 %   Eosinophils Absolute 0.1 0 - 0.7 K/uL   Basophils Relative 1 %   Basophils Absolute 0.1 0 - 0.1 K/uL  Comprehensive metabolic panel     Status: Abnormal   Collection Time: 09/11/14 10:17 PM  Result Value Ref Range   Sodium 143 135 - 145 mmol/L   Potassium 3.6 3.5 - 5.1 mmol/L   Chloride 105 101 - 111 mmol/L   CO2 28 22 - 32 mmol/L   Glucose, Bld 93 65 - 99 mg/dL   BUN 26 (H) 6 - 20 mg/dL   Creatinine, Ser 0.68 0.44 - 1.00 mg/dL   Calcium 9.6 8.9 -  10.3 mg/dL   Total Protein 7.2 6.5 - 8.1 g/dL   Albumin 4.4 3.5 - 5.0 g/dL   AST 22 15 - 41 U/L   ALT 16 14 - 54 U/L   Alkaline Phosphatase 57 38 - 126 U/L   Total Bilirubin 0.5 0.3 - 1.2 mg/dL   GFR calc non Af Amer >60 >60 mL/min   GFR calc Af Amer >60 >60 mL/min    Comment: (NOTE) The eGFR has been calculated using the CKD EPI equation. This calculation has not been validated in all clinical situations. eGFR's persistently <60 mL/min signify possible Chronic Kidney Disease.    Anion gap 10 5 - 15  Acetaminophen level     Status: Abnormal   Collection Time: 09/11/14 10:17 PM  Result Value Ref Range   Acetaminophen (Tylenol), Serum <10 (L) 10 - 30 ug/mL    Comment:        THERAPEUTIC CONCENTRATIONS VARY SIGNIFICANTLY. A RANGE OF 10-30 ug/mL MAY BE AN EFFECTIVE CONCENTRATION FOR MANY PATIENTS. HOWEVER, SOME ARE BEST TREATED AT CONCENTRATIONS OUTSIDE THIS RANGE. ACETAMINOPHEN CONCENTRATIONS >150 ug/mL AT 4 HOURS AFTER INGESTION AND >50 ug/mL AT 12 HOURS AFTER INGESTION ARE OFTEN ASSOCIATED WITH TOXIC REACTIONS.   Salicylate level     Status: None   Collection Time: 09/11/14 10:17 PM  Result Value Ref Range   Salicylate Lvl <4.3 2.8 - 30.0 mg/dL  Ethanol     Status: None   Collection Time: 09/11/14 10:17 PM  Result Value Ref Range   Alcohol, Ethyl (B) <5 <5 mg/dL    Comment:        LOWEST DETECTABLE LIMIT FOR SERUM ALCOHOL IS 11 mg/dL FOR MEDICAL PURPOSES ONLY   Urinalysis complete, with microscopic  ([facility] only)     Status: Abnormal   Collection Time: 09/12/14  2:34 PM  Result Value Ref Range   Color, Urine YELLOW (A) YELLOW   APPearance CLEAR (A) CLEAR   Glucose, UA NEGATIVE NEGATIVE mg/dL   Bilirubin Urine NEGATIVE NEGATIVE   Ketones, ur NEGATIVE NEGATIVE mg/dL   Specific Gravity, Urine 1.016 1.005 - 1.030   Hgb urine dipstick 1+ (A) NEGATIVE   pH 7.0 5.0 - 8.0   Protein, ur NEGATIVE NEGATIVE mg/dL   Nitrite NEGATIVE NEGATIVE   Leukocytes, UA NEGATIVE NEGATIVE   RBC / HPF 0-5 0 - 5 RBC/hpf   WBC, UA 0-5 0 - 5 WBC/hpf   Bacteria, UA NONE SEEN NONE SEEN   Squamous Epithelial / LPF 0-5 (A) NONE SEEN   Mucous PRESENT     Vitals: Blood pressure 111/74, pulse 84, temperature 97.4 F (36.3 C), temperature source Oral, resp. rate 18, height '5\' 1"'  (1.549 m), weight 34.927 kg (77 lb), SpO2 95 %.  Risk to Self: Suicidal Ideation: Yes-Currently Present Suicidal Intent: No Is patient at risk for suicide?: Yes Suicidal Plan?: Yes-Currently Present Specify Current Suicidal Plan: "To take pills; to jump in front of a car." Access to Means: Yes Specify Access to Suicidal Means: self What has been your use of drugs/alcohol within the last 12 months?: none How many times?: 0 Other Self Harm Risks: feeling wothless Triggers for Past Attempts: None known Intentional Self Injurious Behavior: None Risk to Others: Homicidal Ideation: No Thoughts of Harm to Others: No Current Homicidal Intent: No Current Homicidal Plan: No Access to Homicidal Means: No Identified Victim: none History of harm to others?: No Assessment of Violence: On admission Violent Behavior Description: none Does patient have access to weapons?: No Criminal  Charges Pending?: No Does patient have a court date: No Prior Inpatient Therapy: Prior Inpatient Therapy: Yes Prior Therapy Dates: 2 weeks ago Prior Therapy Facilty/Provider(s): [facility] Reason for Treatment: depression Prior Outpatient Therapy: Prior Outpatient  Therapy: Yes Prior Therapy Dates: quarterly Prior Therapy Facilty/Provider(s): [facility] Reason for Treatment: depression Does patient have an ACCT team?: No Does patient have Intensive In-House Services?  : No Does patient have [facility] services? : No Does patient have P4CC services?: No  Current Facility-Administered Medications  Medication Dose Route Frequency Provider Last Rate Last Dose  . albuterol (PROVENTIL HFA;VENTOLIN HFA) 108 (90 BASE) MCG/ACT inhaler 2 puff  2 puff Inhalation Q6H PRN Harvest Dark, MD      . buPROPion (WELLBUTRIN XL) 24 hr tablet 150 mg  150 mg Oral Daily Harvest Dark, MD   150 mg at 09/12/14 1757  . LORazepam (ATIVAN) tablet 0.5 mg  0.5 mg Oral TID Harvest Dark, MD   0.5 mg at 09/12/14 1721  . mirtazapine (REMERON) tablet 30 mg  30 mg Oral QHS Harvest Dark, MD      . QUEtiapine (SEROQUEL) tablet 25 mg  25 mg Oral BID Harvest Dark, MD      . tiotropium [facility]) inhalation capsule 18 mcg  18 mcg Inhalation Daily Harvest Dark, MD   18 mcg at 09/12/14 1757   Current Outpatient Prescriptions  Medication Sig Dispense Refill  . acetaminophen (TYLENOL) 325 MG tablet Take 325-650 mg by mouth every 6 (six) hours as needed for mild pain or headache.     . albuterol (PROVENTIL HFA;VENTOLIN HFA) 108 (90 BASE) MCG/ACT inhaler Inhale 1 puff into the lungs every 6 (six) hours as needed for wheezing or shortness of breath.    Marland Kitchen buPROPion (WELLBUTRIN XL) 150 MG 24 hr tablet Take 150 mg by mouth every morning.    Marland Kitchen LORazepam (ATIVAN) 0.5 MG tablet Take 0.5 mg by mouth 3 (three) times daily.    . mirtazapine (REMERON) 30 MG tablet Take 30 mg by mouth at bedtime.    Marland Kitchen QUEtiapine (SEROQUEL) 25 MG tablet Take 50 mg by mouth 2 (two) times daily.    Marland Kitchen tiotropium (SPIRIVA) 18 MCG inhalation capsule Place 18 mcg into inhaler and inhale daily.      Musculoskeletal: Strength & Muscle Tone: decreased Gait & Station: normal Patient leans:  N/A  Psychiatric Specialty Exam: Physical Exam  Constitutional: She is oriented to person, place, and time. She appears distressed.  HENT:  Head: Normocephalic and atraumatic.  Eyes: Conjunctivae are normal. Pupils are equal, round, and reactive to light.  Neck: Normal range of motion.  Cardiovascular: Normal heart sounds.   Respiratory: Effort normal.  GI: Soft.  Musculoskeletal: Normal range of motion.  Neurological: She is alert and oriented to person, place, and time.  Skin: Skin is warm and dry.  Psychiatric: Her mood appears anxious. Her affect is blunt. Her speech is delayed. She is slowed and withdrawn. Thought content is paranoid and delusional. Cognition and memory are impaired. She expresses inappropriate judgment. She exhibits a depressed mood. She expresses suicidal ideation. She exhibits abnormal remote memory.  Patient is a cachectic woman poorly groomed looks in great emotional distress. She is rubbing her for head constantly out of anxiety. Poor eye contact. Admits to delusional beliefs and suicidal ideation.    Review of Systems  Constitutional: Positive for weight loss and malaise/fatigue.  HENT: Negative.   Eyes: Negative.   Respiratory: Positive for shortness of breath.   Cardiovascular: Negative.  Gastrointestinal: Negative.   Musculoskeletal: Negative.   Skin: Positive for rash.  Neurological: Negative.   Psychiatric/Behavioral: Positive for depression, suicidal ideas and memory loss. Negative for hallucinations and substance abuse. The patient is nervous/anxious and has insomnia.     Blood pressure 111/74, pulse 84, temperature 97.4 F (36.3 C), temperature source Oral, resp. rate 18, height '5\' 1"'  (1.549 m), weight 34.927 kg (77 lb), SpO2 95 %.Body mass index is 14.56 kg/(m^2).  General Appearance: Cachectic poorly groomed and appears very sick  Engineer, water::  Minimal  Speech:  Slow  Volume:  Decreased  Mood:  Anxious and Depressed  Affect:  Blunt and  Restricted  Thought Process:  Circumstantial and Illogical and focused on her delusion  Orientation:  Full (Time, Place, and Person)  Thought Content:  Delusions  Suicidal Thoughts:  Yes.  without intent/plan  Homicidal Thoughts:  No  Memory:  Immediate;   Good Recent;   Good Remote;   Good  Judgement:  Impaired  Insight:  Shallow  Psychomotor Activity:  Decreased and Mannerisms  Concentration:  Poor  Recall:  [place] of Knowledge:Good  Language: Good  Akathisia:  No  Handed:  Right  AIMS (if indicated):     Assets:  Desire for Improvement Housing Social Support  ADL's:  Impaired  Cognition: Impaired,  Mild  Sleep:      Medical Decision Making: Review of Psycho-Social Stressors (1), Review and summation of old records (2), Established Problem, Worsening (2), Review of Last Therapy Session (1), Review or order medicine tests (1), Review of Medication Regimen & Side Effects (2) and Review of New Medication or Change in Dosage (2)  Treatment Plan Summary: Medication management and Plan Patient requires inpatient hospitalization because of her psychosis her anorexia her suicidal ideation. Patient will be admitted to psychiatry. She is on 15 minute checks. Continue current medicine as best I can determine from the information that we have. Primary treatment team can discuss further treatment in the hospital. Labs will be further reviewed although everything so far looks pretty normal. Restart Spiriva inhaler.  Plan:  Recommend psychiatric Inpatient admission when medically cleared. Supportive therapy provided about ongoing stressors. Discussed crisis plan, support from social network, calling 911, coming to the Emergency Department, and calling Suicide Hotline. Disposition: Admit to psychiatry  Alethia Berthold 09/12/2014 6:36 PM

## 2014-09-12 NOTE — ED Notes (Signed)
Pt given snack tray. 
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
## 2014-09-12 NOTE — ED Notes (Signed)
ED BHU [place] Is the patient under IVC or is there intent for IVC: Yes.   Is the patient medically cleared: Yes.   Is there vacancy in the ED BHU: Yes.   Is the population mix appropriate for patient: Yes.   Is the patient awaiting placement in inpatient or outpatient setting: Yes.   Has the patient had a psychiatric consult: Yes.   Survey of unit performed for contraband, proper placement and condition of furniture, tampering with fixtures in bathroom, shower, and each patient room: Yes.  ; Findings:  APPEARANCE/BEHAVIOR calm, cooperative and adequate rapport can be established NEURO ASSESSMENT Orientation: person Hallucinations: No.None noted (Hallucinations) Speech: Normal Gait: normal RESPIRATORY ASSESSMENT Normal expansion.  Clear to auscultation.  No rales, rhonchi, or wheezing. CARDIOVASCULAR ASSESSMENT regular rate and rhythm, S1, S2 normal, no murmur, click, rub or gallop GASTROINTESTINAL ASSESSMENT soft, nontender, BS WNL, no r/g EXTREMITIES normal strength, tone, and muscle mass PLAN OF CARE Provide calm/safe environment. Vital signs assessed twice daily. ED BHU Assessment once each 12-hour shift. Collaborate with intake RN daily or as condition indicates. Assure the ED provider has rounded once each shift. Provide and encourage hygiene. Provide redirection as needed. Assess for escalating behavior; address immediately and inform ED provider.  Assess family dynamic and appropriateness for visitation as needed: Yes.  ; If necessary, describe findings:  Educate the patient/family about BHU procedures/visitation: Yes.  ; If necessary, describe findings:

## 2014-09-12 NOTE — ED Notes (Signed)
BEHAVIORAL HEALTH ROUNDING Patient sleeping: YES Patient alert and oriented: YES Behavior appropriate: YES Describe behavior: No inappropriate or unacceptable behaviors noted at this time.  Nutrition and fluids offered: YES Toileting and hygiene offered: YES Sitter present: Industrial/product designer rounding every 15 minutes on patient to ensure safety.  Law enforcement present: Programmer, applications agency: [place] (ODS)

## 2014-09-12 NOTE — ED Notes (Signed)
Report received from end-shift RN. Patient care assumed. Patient/RN introduction complete. Will continue to monitor.

## 2014-09-12 NOTE — ED Notes (Signed)
Pt  Moved  To  BHU  Pending  Psych  Consult   All  IVC  PAPERWORK  ON  CHART

## 2014-09-12 NOTE — ED Notes (Signed)
BEHAVIORAL HEALTH ROUNDING Patient sleeping: no Patient alert and oriented: YES Behavior appropriate: YES Describe behavior: No inappropriate or unacceptable behaviors noted at this time.  Nutrition and fluids offered: YES Toileting and hygiene offered: YES Sitter present: Industrial/product designer rounding every 15 minutes on patient to ensure safety.  Law enforcement present: Programmer, applications agency: [place] (ODS)

## 2014-09-12 NOTE — ED Notes (Signed)
BEHAVIORAL HEALTH ROUNDING Patient sleeping: No. Patient alert and oriented: yes Behavior appropriate: Yes.  ; If no, describe:  Nutrition and fluids offered: Yes  Toileting and hygiene offered: Yes  Sitter present: no Law enforcement present: Yes  

## 2014-09-12 NOTE — ED Notes (Signed)
BEHAVIORAL HEALTH ROUNDING Patient sleeping: No. Patient alert and oriented: yes Behavior appropriate: Yes.   patient is presenting with ticks; If no, describe:  Nutrition and fluids offered: Yes  Toileting and hygiene offered: Yes  Sitter present: yes Law enforcement present: Yes   ENVIRONMENTAL ASSESSMENT Potentially harmful objects out of patient reach: Yes.   Personal belongings secured: Yes.   Patient dressed in hospital provided attire only: Yes.   Plastic bags out of patient reach: Yes.   Patient care equipment (cords, cables, call bells, lines, and drains) shortened, removed, or accounted for: Yes.   Equipment and supplies removed from bottom of stretcher: Yes.   Potentially toxic materials out of patient reach: Yes.   Sharps container removed or out of patient reach: Yes.

## 2014-09-12 NOTE — ED Notes (Signed)
BEHAVIORAL HEALTH ROUNDING Patient sleeping: Yes.   Patient alert and oriented: Pt is sleeping.  Behavior appropriate: Pt is sleeping Nutrition and fluids offered: Pt is sleeping.  Toileting and hygiene offered: Pt is sleeping.  Sitter present: yes Event organiser present: Yes

## 2014-09-12 NOTE — ED Notes (Signed)

## 2014-09-12 NOTE — ED Notes (Signed)
BEHAVIORAL HEALTH ROUNDING Patient sleeping: Yes.   Patient alert and oriented: yes Behavior appropriate: Yes.  ; If no, describe:  Nutrition and fluids offered: Yes  and No Toileting and hygiene offered: sleeping Sitter present: no Law enforcement present: Yes

## 2014-09-12 NOTE — ED Notes (Signed)
Pt sitting in room calmly, pt without complaints at this time.  Pt pending admission to beh med, will continue to monitor.

## 2014-09-12 NOTE — ED Notes (Signed)
Patient assigned to appropriate care area. Patient oriented to unit/care area: Informed that, for their safety, care areas are designed for safety and monitored by staff at all times; and visiting hours explained to patient. Patient verbalizes understanding, and verbal contract for safety obtained.

## 2014-09-12 NOTE — ED Notes (Signed)

## 2014-09-12 NOTE — BHH Counselor (Signed)
Pt. Has Medicare A&B, per pt. Access [facility]).

## 2014-09-12 NOTE — ED Notes (Signed)
BEHAVIORAL HEALTH ROUNDING Patient sleeping: Yes.   Patient alert and oriented: yes Behavior appropriate: Yes.  ; If no, describe:  Nutrition and fluids offered: sleeping Toileting and hygiene offered: sleeping Sitter present: no Law enforcement present: Yes

## 2014-09-12 NOTE — ED Notes (Signed)
meds given per md order

## 2014-09-13 ENCOUNTER — Inpatient Hospital Stay
Admission: EM | Admit: 2014-09-13 | Discharge: 2014-10-29 | DRG: 885 | Disposition: A | Discharge status: Home Health | Payer: Medicare Other | Source: Intra-hospital | Attending: Psychiatry | Admitting: Psychiatry

## 2014-09-13 DIAGNOSIS — F411 Generalized anxiety disorder: Secondary | ICD-10-CM | POA: Diagnosis present

## 2014-09-13 DIAGNOSIS — R630 Anorexia: Secondary | ICD-10-CM | POA: Diagnosis present

## 2014-09-13 DIAGNOSIS — I214 Non-ST elevation (NSTEMI) myocardial infarction: Secondary | ICD-10-CM | POA: Diagnosis not present

## 2014-09-13 DIAGNOSIS — F333 Major depressive disorder, recurrent, severe with psychotic symptoms: Secondary | ICD-10-CM | POA: Diagnosis present

## 2014-09-13 DIAGNOSIS — K649 Unspecified hemorrhoids: Secondary | ICD-10-CM | POA: Diagnosis present

## 2014-09-13 DIAGNOSIS — J449 Chronic obstructive pulmonary disease, unspecified: Secondary | ICD-10-CM | POA: Diagnosis present

## 2014-09-13 DIAGNOSIS — Z801 Family history of malignant neoplasm of trachea, bronchus and lung: Secondary | ICD-10-CM | POA: Diagnosis not present

## 2014-09-13 DIAGNOSIS — Z9981 Dependence on supplemental oxygen: Secondary | ICD-10-CM

## 2014-09-13 DIAGNOSIS — Z9114 Patient's other noncompliance with medication regimen: Secondary | ICD-10-CM | POA: Diagnosis present

## 2014-09-13 DIAGNOSIS — G4700 Insomnia, unspecified: Secondary | ICD-10-CM | POA: Diagnosis present

## 2014-09-13 DIAGNOSIS — F332 Major depressive disorder, recurrent severe without psychotic features: Secondary | ICD-10-CM | POA: Diagnosis not present

## 2014-09-13 DIAGNOSIS — Z681 Body mass index (BMI) 19 or less, adult: Secondary | ICD-10-CM

## 2014-09-13 DIAGNOSIS — Z90710 Acquired absence of both cervix and uterus: Secondary | ICD-10-CM | POA: Diagnosis not present

## 2014-09-13 DIAGNOSIS — Z808 Family history of malignant neoplasm of other organs or systems: Secondary | ICD-10-CM | POA: Diagnosis not present

## 2014-09-13 DIAGNOSIS — R64 Cachexia: Secondary | ICD-10-CM | POA: Diagnosis present

## 2014-09-13 DIAGNOSIS — F22 Delusional disorders: Secondary | ICD-10-CM | POA: Diagnosis present

## 2014-09-13 DIAGNOSIS — Z8249 Family history of ischemic heart disease and other diseases of the circulatory system: Secondary | ICD-10-CM | POA: Diagnosis not present

## 2014-09-13 DIAGNOSIS — R45851 Suicidal ideations: Secondary | ICD-10-CM | POA: Diagnosis present

## 2014-09-13 DIAGNOSIS — K5900 Constipation, unspecified: Secondary | ICD-10-CM | POA: Diagnosis present

## 2014-09-13 DIAGNOSIS — Z9889 Other specified postprocedural states: Secondary | ICD-10-CM

## 2014-09-13 DIAGNOSIS — Z811 Family history of alcohol abuse and dependence: Secondary | ICD-10-CM | POA: Diagnosis not present

## 2014-09-13 DIAGNOSIS — R062 Wheezing: Secondary | ICD-10-CM

## 2014-09-13 DIAGNOSIS — Z9049 Acquired absence of other specified parts of digestive tract: Secondary | ICD-10-CM | POA: Diagnosis present

## 2014-09-13 DIAGNOSIS — R911 Solitary pulmonary nodule: Secondary | ICD-10-CM

## 2014-09-13 DIAGNOSIS — F061 Catatonic disorder due to known physiological condition: Secondary | ICD-10-CM

## 2014-09-13 MED ORDER — ALBUTEROL SULFATE HFA 108 (90 BASE) MCG/ACT IN AERS: 2.0000 | INHALATION_SPRAY | Freq: Four times a day (QID) | RESPIRATORY_TRACT | Status: DC | PRN

## 2014-09-13 MED ORDER — MAGNESIUM HYDROXIDE 400 MG/5ML PO SUSP: 30.0000 mL | Freq: Every day | ORAL | Status: DC | PRN

## 2014-09-13 MED ORDER — ACETAMINOPHEN 325 MG PO TABS
650.0000 mg | ORAL_TABLET | Freq: Four times a day (QID) | ORAL | Status: DC | PRN
  Administered 2014-09-15 – 2014-10-21 (×3): 650 mg via ORAL
  Filled 2014-09-13 (×3): qty 2

## 2014-09-13 MED ORDER — BUPROPION HCL ER (XL) 150 MG PO TB24
150.0000 mg | ORAL_TABLET | Freq: Every day | ORAL | Status: DC
  Filled 2014-09-13: qty 1

## 2014-09-13 MED ORDER — OLANZAPINE 5 MG PO TABS
2.5000 mg | ORAL_TABLET | Freq: Every day | ORAL | Status: DC
  Administered 2014-09-13 – 2014-09-14 (×2): 2.5 mg via ORAL
  Filled 2014-09-13 (×2): qty 1

## 2014-09-13 MED ORDER — ALUM & MAG HYDROXIDE-SIMETH 200-200-20 MG/5ML PO SUSP: 30.0000 mL | ORAL | Status: DC | PRN

## 2014-09-13 MED ORDER — TIOTROPIUM BROMIDE MONOHYDRATE 18 MCG IN CAPS
18.0000 ug | ORAL_CAPSULE | Freq: Every day | RESPIRATORY_TRACT | Status: DC
  Administered 2014-09-13 – 2014-10-28 (×45): 18 ug via RESPIRATORY_TRACT
  Filled 2014-09-13 (×9): qty 5

## 2014-09-13 MED ORDER — ENSURE ENLIVE PO LIQD
237.0000 mL | Freq: Two times a day (BID) | ORAL | Status: DC
  Administered 2014-09-13 – 2014-09-15 (×5): 237 mL via ORAL

## 2014-09-13 MED ORDER — ALBUTEROL SULFATE (2.5 MG/3ML) 0.083% IN NEBU
2.5000 mg | INHALATION_SOLUTION | Freq: Four times a day (QID) | RESPIRATORY_TRACT | Status: DC | PRN
  Administered 2014-09-19 – 2014-10-01 (×9): 2.5 mg via RESPIRATORY_TRACT
  Filled 2014-09-13 (×6): qty 3

## 2014-09-13 MED ORDER — PNEUMOCOCCAL VAC POLYVALENT 25 MCG/0.5ML IJ INJ
0.5000 mL | INJECTION | INTRAMUSCULAR | Status: AC
  Administered 2014-09-22: 0.5 mL via INTRAMUSCULAR
  Filled 2014-09-13 (×2): qty 0.5

## 2014-09-13 MED ORDER — LORAZEPAM 0.5 MG PO TABS
0.5000 mg | ORAL_TABLET | Freq: Three times a day (TID) | ORAL | Status: DC
  Filled 2014-09-13: qty 1

## 2014-09-13 MED ORDER — CITALOPRAM HYDROBROMIDE 20 MG PO TABS
10.0000 mg | ORAL_TABLET | Freq: Every day | ORAL | Status: DC
  Administered 2014-09-13: 10 mg via ORAL
  Administered 2014-09-14: 0.5 mg via ORAL
  Administered 2014-09-15: 10 mg via ORAL
  Filled 2014-09-13: qty 2
  Filled 2014-09-13 (×2): qty 1

## 2014-09-13 MED ORDER — MIRTAZAPINE 30 MG PO TABS
30.0000 mg | ORAL_TABLET | Freq: Every day | ORAL | Status: DC
  Administered 2014-09-13 – 2014-09-21 (×9): 30 mg via ORAL
  Filled 2014-09-13 (×9): qty 1

## 2014-09-13 MED ORDER — QUETIAPINE FUMARATE 25 MG PO TABS
25.0000 mg | ORAL_TABLET | Freq: Two times a day (BID) | ORAL | Status: DC
  Filled 2014-09-13: qty 1

## 2014-09-13 MED ORDER — LORAZEPAM 0.5 MG PO TABS
0.5000 mg | ORAL_TABLET | Freq: Two times a day (BID) | ORAL | Status: DC
  Administered 2014-09-13 – 2014-09-15 (×5): 0.5 mg via ORAL
  Filled 2014-09-13 (×4): qty 1

## 2014-09-13 NOTE — ED Notes (Signed)
BEHAVIORAL HEALTH ROUNDING Patient sleeping: YES Patient alert and oriented: YES Behavior appropriate: YES Describe behavior: No inappropriate or unacceptable behaviors noted at this time.  Nutrition and fluids offered: YES Toileting and hygiene offered: YES Sitter present: Industrial/product designer rounding every 15 minutes on patient to ensure safety.  Law enforcement present: Programmer, applications agency: [place] (ODS)

## 2014-09-13 NOTE — Progress Notes (Signed)
Initial Nutrition Assessment  DOCUMENTATION CODES:     INTERVENTION:   (Meals and Snacks: Cater to patient preferences)  Medical Nutrition Supplement: Art therapist with each meal for additional nutrition  NUTRITION DIAGNOSIS:  Inadequate oral intake related to acute illness as evidenced by patient/ family report per MST  GOAL:  Patient will meet greater than or equal to 90% of their needs  MONITOR:  PO intake  REASON FOR ASSESSMENT:  Malnutrition Screening Tool    ASSESSMENT:  Reason For Admission: severe depression PMHx:  Past Medical History  Diagnosis Date  . Anxiety   . COPD (chronic obstructive pulmonary disease)   . Anxiety   . 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 No significant weight changes to note. BMI is low at 14.5   Height:  Ht Readings from Last 1 Encounters:  09/13/14 '5\' 1"'$  (1.549 m)    Weight:  Wt Readings from Last 1 Encounters:  09/13/14 77 lb (34.927 kg)    Ideal Body Weight:     Wt Readings from Last 10 Encounters:  09/13/14 77 lb (34.927 kg)  07/10/14 75 lb 8 oz (34.247 kg)  03/11/14 82 lb 5 oz (37.337 kg)    BMI:  Body mass index is 14.56 kg/(m^2).  Skin:  Reviewed, no issues  Diet Order:  Diet regular Room service appropriate?: Yes; Fluid consistency:: Thin  EDUCATION NEEDS:  No education needs identified at this time   Intake/Output Summary (Last 24 hours) at 09/13/14  1400 Last data filed at 09/13/14 1309  Gross per 24 hour  Intake    720 ml  Output      0 ml  Net    720 ml    Last BM:  6/3  Roda Shutters, RDN Pager: [phone] Office: [place]

## 2014-09-13 NOTE — Plan of Care (Signed)
Problem: Ineffective individual coping Goal: LTG: Patient will report a decrease in negative feelings Outcome: Not Progressing Patient reports still feeling very depressed and having no interest.

## 2014-09-13 NOTE — Progress Notes (Signed)
Melissa George admitted from the Emergency Department under IVC for paranoia and psychosis. 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 She feels like she is a bad person. She is flat but cooperative with treatment. She appears to be sleep at this time.

## 2014-09-13 NOTE — ED Notes (Signed)
BEHAVIORAL HEALTH ROUNDING Patient sleeping: YES Patient alert and oriented: YES Behavior appropriate: YES Describe behavior: No inappropriate or unacceptable behaviors noted at this time.  Nutrition and fluids offered: YES Toileting and hygiene offered: YES Sitter present: Industrial/product designer rounding every 15 minutes on patient to ensure safety.  Law enforcement present: Programmer, applications agency: [place] (ODS)

## 2014-09-13 NOTE — Progress Notes (Signed)
Pt has been pleasant and cooperative. Pt denies SI and A/V hallucinations. Pt has attended some unit activities. Pt's mood and affect has been depressed.
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
## 2014-09-13 NOTE — Tx Team (Signed)
Initial Interdisciplinary Treatment Plan   PATIENT STRESSORS: Occupational concerns   PATIENT STRENGTHS: Financial means   PROBLEM LIST: Problem List/Patient Goals Date to be addressed Date deferred Reason deferred Estimated date of resolution  Depression 09/13/14   10/13/14                                                   DISCHARGE CRITERIA:  Improved stabilization in mood, thinking, and/or behavior  PRELIMINARY DISCHARGE PLAN: Return to previous living arrangement  PATIENT/FAMIILY INVOLVEMENT: This treatment plan has been presented to and reviewed with the patient, Melissa George, and/or family member.  The patient and family have been given the opportunity to ask questions and make suggestions.  [place] 09/13/2014, 5:45 AM

## 2014-09-13 NOTE — ED Notes (Signed)
Pt resting in bed.

## 2014-09-13 NOTE — [facility] Group Notes (Signed)
[facility] Group Notes:  (Nursing/MHT/Case Management/Adjunct)  Date:  09/13/2014  Time:  10:49 AM  Type of Therapy:  Community Meeting   Participation Level:  Minimal  Participation Quality:  Attentive  Affect:  Flat  Cognitive:  Lacking  Insight:  Limited  Engagement in Group:  None  Modes of Intervention:  Discussion  Summary of Progress/Problems:  Melissa George Melissa George 09/13/2014, 10:49 AM

## 2014-09-14 LAB — LIPID PANEL
Cholesterol: 183 mg/dL (ref 0–200)
HDL: 53 mg/dL (ref >40)
LDL Cholesterol: 83 mg/dL (ref 0–99)
Total CHOL/HDL Ratio: 3.5 RATIO
Triglycerides: 236 mg/dL — ABNORMAL HIGH (ref <150)
VLDL: 47 mg/dL — AB (ref 0–40)

## 2014-09-14 LAB — HEMOGLOBIN A1C: HEMOGLOBIN A1C: 5.1 % (ref 4.0–6.0)

## 2014-09-14 LAB — FOLATE: Folate: 17.5 ng/mL (ref >5.9)

## 2014-09-14 NOTE — [facility] Group Notes (Signed)
[place] LCSW Group Therapy  09/14/2014 3:45 PM  Type of Therapy:  Group Therapy  Participation Level:  Minimal  Participation Quality:  Appropriate and Attentive  Affect:  Appropriate  Cognitive:  Alert and Appropriate  Insight:  Improving  Engagement in Therapy:  Improving  Modes of Intervention:  Socialization and Support  Summary of Progress/Problems: Patient attended group and participated minimally. Patient shared that she enjoys spending time with animals and that she had a dog but her family sent her dog away and she is not too happy about it.   Carmell Austria T 09/14/2014, 3:45 PM

## 2014-09-14 NOTE — Progress Notes (Signed)
Pt's mood and affect has been depressed. Pt continues to endorse having fleeting thoughts of suicide . Pt is able to contract for safety. Pt has attended some unit activities but for the most part has been seclusive to her room.

## 2014-09-14 NOTE — Plan of Care (Signed)
Problem: Ineffective individual coping Goal: STG: Patient will remain free from self harm Outcome: Not Progressing Patient continues to have fleeting thoughts of suicide with a plan to overdose on medications; denies any SI at present and contracts for safety.

## 2014-09-14 NOTE — Plan of Care (Signed)
Problem: Alteration in mood Goal: LTG-Patient reports reduction in suicidal thoughts (Patient reports reduction in suicidal thoughts and is able to verbalize a safety plan for whenever patient is feeling suicidal)  Outcome: Progressing Patient denies having any thoughts of suicide at present.

## 2014-09-14 NOTE — Progress Notes (Signed)
Patient rates her depression a "7"; continues to have fleeting thoughts of suicide with a plan to overdose on medications; denies any SI at present and contracts for safety on the unit; continues to have mild anxiety, even though on standing ativan; has been quiet and cooperative throughout the shift; continue to monitor.
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
## 2014-09-14 NOTE — Plan of Care (Signed)
Problem: Ineffective individual coping Goal: STG: Patient will remain free from self harm Outcome: Progressing Patient denies having any thoughts of suicide, and contracts for safety.

## 2014-09-15 DIAGNOSIS — F333 Major depressive disorder, recurrent, severe with psychotic symptoms: Principal | ICD-10-CM

## 2014-09-15 LAB — VITAMIN B12: Vitamin B-12: 878 pg/mL (ref 180–914)

## 2014-09-15 MED ORDER — LORAZEPAM 0.5 MG PO TABS: 0.2500 mg | ORAL_TABLET | Freq: Two times a day (BID) | ORAL | Status: DC

## 2014-09-15 MED ORDER — CHOLECALCIFEROL 10 MCG (400 UNIT) PO TABS
800.0000 [IU] | ORAL_TABLET | Freq: Every day | ORAL | Status: DC
  Administered 2014-09-15 – 2014-09-26 (×12): 800 [IU] via ORAL
  Filled 2014-09-15 (×13): qty 2

## 2014-09-15 MED ORDER — CALCIUM CARBONATE ANTACID 500 MG PO CHEW
2.5000 | CHEWABLE_TABLET | Freq: Every day | ORAL | Status: DC
  Administered 2014-09-16: 500 mg via ORAL
  Administered 2014-09-17: 200 mg via ORAL
  Administered 2014-09-18: 500 mg via ORAL
  Administered 2014-09-19 – 2014-09-20 (×2): 200 mg via ORAL
  Administered 2014-09-21 – 2014-09-25 (×6): 500 mg via ORAL
  Administered 2014-09-26: 200 mg via ORAL
  Filled 2014-09-15: qty 2.5
  Filled 2014-09-15: qty 1
  Filled 2014-09-15: qty 2.5
  Filled 2014-09-15: qty 2
  Filled 2014-09-15: qty 2.5
  Filled 2014-09-15: qty 1
  Filled 2014-09-15 (×6): qty 2.5
  Filled 2014-09-15 (×3): qty 1
  Filled 2014-09-15: qty 3
  Filled 2014-09-15 (×2): qty 1
  Filled 2014-09-15: qty 2.5
  Filled 2014-09-15: qty 1
  Filled 2014-09-15: qty 2
  Filled 2014-09-15: qty 1
  Filled 2014-09-15: qty 2.5

## 2014-09-15 MED ORDER — OLANZAPINE 10 MG PO TABS: 10.0000 mg | ORAL_TABLET | Freq: Every day | ORAL | Status: DC

## 2014-09-15 MED ORDER — CLONAZEPAM 0.5 MG PO TABS
0.5000 mg | ORAL_TABLET | Freq: Three times a day (TID) | ORAL | Status: DC
  Administered 2014-09-15 – 2014-09-16 (×3): 0.5 mg via ORAL
  Filled 2014-09-15 (×3): qty 1

## 2014-09-15 MED ORDER — QUETIAPINE FUMARATE 100 MG PO TABS
100.0000 mg | ORAL_TABLET | Freq: Every day | ORAL | Status: DC
  Administered 2014-09-15: 100 mg via ORAL
  Filled 2014-09-15: qty 1

## 2014-09-15 MED ORDER — QUETIAPINE FUMARATE 25 MG PO TABS
25.0000 mg | ORAL_TABLET | Freq: Two times a day (BID) | ORAL | Status: DC
  Administered 2014-09-15 – 2014-09-16 (×2): 25 mg via ORAL
  Filled 2014-09-15 (×2): qty 1

## 2014-09-15 MED ORDER — ADULT MULTIVITAMIN W/MINERALS CH
1.0000 | ORAL_TABLET | Freq: Every day | ORAL | Status: DC
  Administered 2014-09-15 – 2014-10-09 (×25): 1 via ORAL
  Filled 2014-09-15 (×29): qty 1

## 2014-09-15 MED ORDER — ENSURE ENLIVE PO LIQD
237.0000 mL | Freq: Three times a day (TID) | ORAL | Status: DC
  Administered 2014-09-15 – 2014-10-10 (×73): 237 mL via ORAL

## 2014-09-15 NOTE — Progress Notes (Signed)
Patient rates her depression a "7"; denies any SI and contracts for safety; admits to still experiencing moderate anxiety at times; continues to be seclusive to her room; states "I feel like I just want to disappear." Denies any hallucinations; has been cooperative throughout the shift; no voiced complaints; continue to monitor.

## 2014-09-15 NOTE — [facility] Group Notes (Signed)
[place] Group Notes:  (Nursing/MHT/Case Management/Adjunct)  Date:  09/15/2014  Time:  4:26 AM  Type of Therapy:  Group Therapy  Participation Level:  Active  Participation Quality:  Appropriate  Affect:  Flat  Cognitive:  Appropriate  Insight:  Limited  Engagement in Group:  Limited  Modes of Intervention:  n/a  Summary of Progress/Problems:  Melissa George 09/15/2014, 4:26 AM

## 2014-09-15 NOTE — Progress Notes (Signed)
Recreation Therapy Notes  INPATIENT RECREATION THERAPY ASSESSMENT  Patient Details Name: Melissa George MRN: [id number] DOB: 06-05-1950 Today's Date: 09/15/2014  Patient Stressors: Family, Death, Friends, Work, Other (Comment) (Death of dog, lack of friends; everything)  Coping Skills:   Isolate, Arguments, Avoidance, Music  Personal Challenges: Communication, Concentration, Decision-Making, Expressing Yourself, Problem-Solving, Relationships, Self-Esteem/Confidence, Social Interaction, Stress Management, Time Management, Trusting Others, Work Midwife (2+):  Individual - TV, Individual - Other (Comment) (Play with dog)  Awareness of Community Resources:  Yes  Community Resources:  Park  Current Use: No  If no, Barriers?: Other (Comment) (Just doesn't do anything)  Patient Strengths:  No  Patient Identified Areas of Improvement:  Everything  Current Recreation Participation:  Nothing  Patient Goal for Hospitalization:  Try to get more self-esteem  [place] of Residence:  [place] of Residence:  [place]   Current SI (including self-harm):  No  Current HI:  No  Consent to Intern Participation: N/A   Leonette Monarch, LRT/CTRS 09/15/2014, 1:51 PM

## 2014-09-15 NOTE — [facility] Group Notes (Signed)
[place] LCSW Group Therapy  09/15/2014 5:54 PM  Type of Therapy:  Group Therapy  Participation Level:  Active  Participation Quality:  Attentive  Affect:  Appropriate  Cognitive:  Appropriate  Insight:  Developing/Improving  Engagement in Therapy:  Developing/Improving  Modes of Intervention:  Discussion, Education, Exploration and Support  Summary of Progress/Problems:Patients group therapy topic was stress and anxiety. Pts were all encouraged to share a stressful and anxious moment and then as a collective group discuss ways to reduce stress and anxiety. This patient was kind and supportive to his peers.   Enis Slipper M 09/15/2014, 5:54 PM

## 2014-09-15 NOTE — Progress Notes (Signed)
Recreation Therapy Notes  Date: 06.06.16 Time: 3:00 pm Location: Craft Room  Group Topic: Wellness  Goal Area(s) Addresses:  Patient will identify at least one item per dimension of health. Patient will examine areas they are deficient in.  Behavioral Response: Attentive  Intervention: 6 Dimensions of Health  Activity: Patients were given a worksheet with the 6 dimensions of health on it. Patients were given a worksheet with the 6 dimensions of health on it and instructed to list at least 1 item under each category.   Education:LRT educated patients on each dimension of health and how they can increase each dimension.  Education Outcome: Acknowledges education/In group clarification offered  Clinical Observations/Feedback: Patient completed activity by listing at least one item in 5 out of the 6 categories. Patient did not contribute to group discussion.  Leonette Monarch, LRT/CTRS 09/15/2014 4:25 PM
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
## 2014-09-15 NOTE — Progress Notes (Signed)
   09/15/14 1100  Clinical Encounter Type  Visited With Patient  Visit Type Spiritual support  Referral From Nurse  Consult/Referral To Chaplain  Spiritual Encounters  Spiritual Needs Prayer  Stress Factors  Patient Stress Factors Health changes  Family Stress Factors Health changes;Financial concerns;Family relationships  Advance Directives (For Healthcare)  Does patient have an advance directive? No  Would patient like information on creating an advanced directive? No - patient declined information   Chaplain provided therapeutic presence, empathic listening and prayer.   AD [phone]

## 2014-09-15 NOTE — BHH Group Notes (Signed)
[facility] Group Notes:  (Nursing/MHT/Case Management/Adjunct)  Date:  09/15/2014  Time:  12:24 PM  Type of Therapy:  Group Therapy  Participation Level:  Minimal  Participation Quality:  Attentive  Affect:  Flat  Cognitive:  Alert and Appropriate  Insight:  Good  Engagement in Group:  Limited  Modes of Intervention:  Support  Summary of Progress/Problems:  Melissa George 09/15/2014, 12:24 PM

## 2014-09-15 NOTE — Progress Notes (Signed)
Patient is very anxious & depressed.States "I just want to disappear."She denies active suicidal thoughts and hallucination.Minimal interaction with peers.Attended groups.Encouraged patient to verbalize her feelings.Complaint with medicine.Stated that her appetite is getting better.

## 2014-09-15 NOTE — Plan of Care (Signed)
Problem: Spiritual Needs Goal: Ability to function at adequate level Outcome: Progressing Called chaplin to talk to patient

## 2014-09-15 NOTE — BHH Group Notes (Signed)
[facility] LCSW Aftercare Discharge Planning Group Note  09/15/2014 10:25 AM  Participation Quality:  Appropriate  Affect:  Anxious  Cognitive:  Appropriate  Insight:  Engaged  Engagement in Group:  Lacking  Modes of Intervention:  Discussion, Education and Support  Summary of Progress/Problems:To work on my anxiety issues, she will attend groups  Pauletta Browns, Tristan Proto M 09/15/2014, 10:25 AM

## 2014-09-16 MED ORDER — HALOPERIDOL 2 MG PO TABS
2.0000 mg | ORAL_TABLET | Freq: Every day | ORAL | Status: DC
  Administered 2014-09-16 – 2014-09-25 (×10): 2 mg via ORAL
  Filled 2014-09-16 (×10): qty 1

## 2014-09-16 MED ORDER — QUETIAPINE FUMARATE 25 MG PO TABS: 50.0000 mg | ORAL_TABLET | Freq: Every day | ORAL | Status: DC

## 2014-09-16 MED ORDER — CLONAZEPAM 0.5 MG PO TABS
0.2500 mg | ORAL_TABLET | Freq: Three times a day (TID) | ORAL | Status: DC
  Administered 2014-09-16 – 2014-09-17 (×3): 0.25 mg via ORAL
  Administered 2014-09-17 (×2): 0.5 mg via ORAL
  Administered 2014-09-18: 0.25 mg via ORAL
  Filled 2014-09-16 (×6): qty 1

## 2014-09-16 NOTE — Progress Notes (Signed)
Recreation Therapy Notes  Date: 06.07.16 Time: 3:00 pm Location: Craft Room  Group Topic: Self-expression  Goal Area(s) Addresses:  Patient will identify one color per emotion listed on wheel. Patient will verbalize benefit of using art as a means of self-expression. Patient will verbalize one emotion experienced during session. Patient will be educated on other forms of self-expression.  Behavioral Response: Did not attend  Intervention: Emotion Wheel  Activity: Patients were given a worksheet with 7 emotions and instructed to pick a color for each emotion.   Education: LRT educated patients on different forms on self-expression.  Education Outcome: Patient did not attend group.  Clinical Observations/Feedback: Patient did not attend group.  Leonette Monarch, LRT/CTRS 09/16/2014 4:20 PM

## 2014-09-16 NOTE — Plan of Care (Signed)
Problem: Ineffective individual coping Goal: STG: Patient will remain free from self harm Outcome: Progressing Patient denies SI/HI, 15 minutes check maintained, will continue to monitor.   Problem: Alteration in mood Goal: LTG-Pt's behavior demonstrates decreased signs of depression (Patient's behavior demonstrates decreased signs of depression to the point the patient is safe to return home and continue treatment in an outpatient setting)  Outcome: Not Progressing Patient not interacting with peers and not attending unit program.

## 2014-09-16 NOTE — Progress Notes (Signed)
Alert and oriented x 4, denies pain affect is flat sad and withdrawn, not attending unit group meeting and not interacting with peers. Patient was encouraged to verbalize feelings, participate in her treatment goals and attend unit group meeting. Patient currently denies SI/HI, 15 minutes checks maintained will continue to monitor.
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
## 2014-09-16 NOTE — Progress Notes (Signed)
Alert and oriented x 4, patient, affect is flat sad and depressed, withdrawn to herself not attending group and not interacting with peers, no distress noted, 15 minutes checks maintained, will continue to monitor.

## 2014-09-16 NOTE — Plan of Care (Signed)
Problem: [facility] Participation in Recreation Therapeutic Interventions Goal: STG-Patient will demonstrate improved self esteem by identif STG: Self-Esteem - Within 5 treatment sessions, patient will verbalize at least 5 positive affirmation statements in each of 3 treatment sessions to increase self-esteem post d/c.  Outcome: Progressing Treatment Session 1; Completed 1 out of 3: At approximately 10:10 am, LRT met with patient in patient room. Patient verbalized 5 positive affirmation statements. Patient reported it felt "okay". LRT encouraged patient to continue saying positive affirmation statements.  Leonette Monarch, LRT/CTRS 06.07.16 12:40 am Goal: STG-Other Recreation Therapy Goal (Specify) STG: Stress Management - Within 7 treatment sessions, patient will demonstrate at least one stress management technique in each of 2 treatment sessions to increase stress management skills post d/c.  Outcome: Progressing Treatment Session 1; Completed 0 out of 2: At approximately 10:10 am, LRT met with patient in patient room. LRT educated and provided patient with handouts on stress management techniques. Patient verbalized understanding. LRT encouraged patient to read over and practice the stress management techniques.  Leonette Monarch, LRT/CTRS 06.07.16 12:41 pm

## 2014-09-16 NOTE — Progress Notes (Signed)
Seclusive in the room,no interaction with peers.She was dizzy this afternoon.Encouraged fluids & maintained fall precautions.She denies dizzyness now.Still anxious & depressed.She stated that she did not want to go to day room for dinner,people will make fun of her.She went to day room for dinner with encouragement & had 50% of dinner.

## 2014-09-16 NOTE — [facility] Group Notes (Signed)
[facility] Group Notes:  (Nursing/MHT/Case Management/Adjunct)  Date:  09/16/2014  Time:  2:38 PM  Type of Therapy:  Group Therapy  Participation Level:  Did Not Attend   Melissa George 09/16/2014, 2:38 PM

## 2014-09-16 NOTE — Plan of Care (Signed)
Problem: Alteration in mood Goal: LTG-Patient reports reduction in suicidal thoughts (Patient reports reduction in suicidal thoughts and is able to verbalize a safety plan for whenever patient is feeling suicidal)  Outcome: Progressing Patient denies SI/HI, 15 minutes maintained.

## 2014-09-17 MED ORDER — MEGESTROL ACETATE 400 MG/10ML PO SUSP
800.0000 mg | Freq: Every day | ORAL | Status: DC
  Administered 2014-09-17 – 2014-09-19 (×3): 800 mg via ORAL
  Filled 2014-09-17 (×3): qty 20
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
## 2014-09-17 NOTE — Progress Notes (Signed)
Continues to endorse depression but denies SI at this time.  Appetite poor, have to insist that she drinks her ensure and try to get her to eat.  Seclusive to room with not interaction noted with peers when out of room.  Reluctant to discuss why she is here.

## 2014-09-17 NOTE — [facility] Group Notes (Signed)
[facility] Group Notes:  (Nursing/MHT/Case Management/Adjunct)  Date:  09/17/2014  Time:  12:06 PM  Type of Therapy:  Group Therapy  Participation Level:  None  Participation Quality:  Inattentive  Affect:  Flat  Cognitive:  Lacking  Insight:  None  Engagement in Group:  Lacking  Modes of Intervention:  n/a  Summary of Progress/Problems:  Melissa George 09/17/2014, 12:06 PM

## 2014-09-17 NOTE — Plan of Care (Signed)
Problem: Spiritual Needs Goal: Ability to function at adequate level Outcome: Not Progressing Continues to be depressed and reluctant to talk about feelings.

## 2014-09-17 NOTE — Plan of Care (Signed)
Problem: [facility] Participation in Recreation Therapeutic Interventions Goal: STG-Patient will demonstrate improved self esteem by identif STG: Self-Esteem - Within 5 treatment sessions, patient will verbalize at least 5 positive affirmation statements in each of 3 treatment sessions to increase self-esteem post d/c.  Outcome: Progressing Treatment Session 2; Completed 2 out of 3: At approximately 2:15 pm, LRT met with patient in patient room. Patient verbalized 5 positive affirmation statements. Patient reported, "It sounds like someone else." LRT encouraged patient to continue saying positive affirmation statements.  Leonette Monarch, LRT/CTRS 06.08.16 2:48 pm Goal: STG-Other Recreation Therapy Goal (Specify) STG: Stress Management - Within 7 treatment sessions, patient will demonstrate at least one stress management technique in each of 2 treatment sessions to increase stress management skills post d/c.  Outcome: Not Progressing Treatment Session 2; Completed 0 out of 2: At approximately 2:15 pm, LRT met with patient in patient room. Patient reported she had not read over the stress management techniques. LRT encouraged patient to read over and practice the stress management techniques.   Leonette Monarch, LRT/CTRS 06.08.16 2:50 pm

## 2014-09-17 NOTE — Progress Notes (Signed)
[facility] LCSW Aftercare Discharge Planning Group Note  09/17/2014 10:44 AM  Participation Quality:  Patient did not attend group  Affect:    Cognitive:    Insight:    Engagement in Group:    Modes of Intervention:    Summary of Progress/Problems:  Joana Reamer 09/17/2014, 10:44 AM

## 2014-09-17 NOTE — Progress Notes (Signed)
[facility] LCSW Group Therapy  09/17/2014 7:43 AM  Type of Therapy:  Group Therapy  Participation Level:  Did Not Attend  Participation Quality:  na  Affect:  na  Cognitive:  na  Insight:  na  Engagement in Therapy:  na  Modes of Intervention:  na  Summary of Progress/Problems:  Joana Reamer 09/17/2014, 7:43 AM

## 2014-09-17 NOTE — Progress Notes (Signed)
Recreation Therapy Notes  Date: 06.08.16 Time: 3:00 pm Location: Craft Room  Group Topic: Self-esteem  Goal Area(s) Addresses:  Patient will write down at least one positive trait about self. Patient will verbalize how it felt to see positive traits on paper.  Behavioral Response: Attentive, Interactive  Intervention: I Am  Activity: Patients were given a worksheet with the letter I on it and instructed to list as many positive things inside the letter as they can.  Education: LRT educated patient on ways to increase self-esteem   Education Outcome: Acknowledges education/In group clarification offered  Clinical Observations/Feedback: Patient wrote approximately 5 positive traits. Patient contributed to group discussion by stating it was difficult to list positive traits.  Leonette Monarch, LRT/CTRS 09/17/2014 4:35 PM

## 2014-09-17 NOTE — Tx Team (Signed)
Interdisciplinary Treatment Plan Update (Adult)  Date:  09/17/2014 Time Reviewed:  10:29 AM  Progress in Treatment: Attending groups: No. Participating in groups:  No. Taking medication as prescribed:  Yes. Tolerating medication:  Yes. Family/Significant othe contact made:  Yes, individual(s) contacted:   Dr. spoke with Pt's son, SW will follow up Patient understands diagnosis:  No. Discussing patient identified problems/goals with staff:  Yes. Medical problems stabilized or resolved:  Yes. Denies suicidal/homicidal ideation: Yes. Issues/concerns per patient self-inventory:  No. Other:  New problem(s) identified: No, Describe:     Discharge Plan or Barriers: TBD, Family has her own home to return to if she improves and is capable appropriate self care.  Reason for Continuation of Hospitalization: Anxiety Delusions  Hallucinations Medication stabilization Other; describe she is not eating consistantly. Requiring much encouragement to eat minimally.  Comments:Ms. Constantin is a 64 year old divorced Caucasian female with a history of major depressive disorder with psychotic features followed by Dr. Jimmye Norman as an outpatient for psychotropic medication management to came to the emergency room secondary to paranoid and delusional thoughts as well as worsening depressive symptoms. The patient has been compliant with Seroquel, Wellbutrin, Ativan and Remeron as prescribed to her by Dr. Jimmye Norman and last saw Dr. Jimmye Norman on May 20. More recently, she has been having recurrent and obsessive thoughts that she is going to jail and that her son is also going to be arrested for not paying hospital bills. She had informed of the psychiatrist in the emergency room that she was also having thoughts that she would be arrested because her manager at work thought that she was stealing money. She denies any current active suicidal thoughts but has had some passive suicidal thoughts that she should die because  she believes that she is about person. She denies any homicidal thoughts. Currently, she denies any auditory or visual hallucinations but did have problems with hallucinations prior to her admission in November. The patient says that one of her sons has been visiting from out of town and there has been some conflict at home as well. She cannot identify any other specific stressors for recent psychotic symptoms. The patient appears cachectic and has not been eating well. She says her appetite has been low. She does report some problems with insomnia as well. The patient is willing to be compliant with medications and outpatient treatment. She is calm and cooperative and there are no signs of agitation or aggression. She denies any history of any heavy alcohol use or illicit drug use but there is questionable overuse of benzodiazepine.    Estimated length of stay:  New goal(s):  Review of initial/current patient goals per problem list:  SEE PLAN OF CARE  Attendees: Patient:  Melissa George 6/8/201610:29 AM  Family:   6/8/201610:29 AM  Physician:  Merlyn Albert, MD 6/8/201610:29 AM  Nursing:    6/8/201610:29 AM  Case Manager:   6/8/201610:29 AM  Counselor:  Dossie Arbour, LCSW 6/8/201610:29 AM  Other:  Carmell Austria, LCSWA 6/8/201610:29 AM  Other:   6/8/201610:29 AM  Other:   6/8/201610:29 AM  Other:  6/8/201610:29 AM  Other:  6/8/201610:29 AM  Other:  6/8/201610:29 AM  Other:  6/8/201610:29 AM  Other:  6/8/201610:29 AM  Other:  6/8/201610:29 AM  Other:   6/8/201610:29 AM   Scribe for Treatment Team:   Dossie Arbour P,LCSW 09/17/2014, 10:29 AM

## 2014-09-17 NOTE — Progress Notes (Signed)
Nutrition Follow-up  DOCUMENTATION CODES:     INTERVENTION:   (Meals and Snacks: Cater to patient preferences)  Medical Nutrition Supplement: Continue Mighty Shakes TID for additional nutrition Continue Ensure TID between meals for additional nutrition.  NUTRITION DIAGNOSIS:  Inadequate oral intake related to acute illness as evidenced by per patient/family report, continues  GOAL:  Patient will meet greater than or equal to 90% of their needs  MONITOR:  PO intake  REASON FOR ASSESSMENT:  Consult Poor PO  ASSESSMENT:  Clinical Update: Consult for poor intake Typical Food/ Fluid Intake: 25-50% of meals per I/O x last 24 hrs Weight Changes: Weight gain of two pounds since admission Labs: No recent labs Meds: Vit D, Megace, MVI Physical Findings: n/a  Height:  Ht Readings from Last 1 Encounters:  09/13/14 '5\' 1"'$  (1.549 m)    Weight:  Wt Readings from Last 1 Encounters:  09/17/14 79 lb (35.834 kg)    Ideal Body Weight:     Wt Readings from Last 10 Encounters:  09/17/14 79 lb (35.834 kg)  09/11/14 77 lb (34.927 kg)  07/10/14 75 lb 8 oz (34.247 kg)  03/11/14 82 lb 5 oz (37.337 kg)    BMI:  Body mass index is 14.93 kg/(m^2).  Skin:  Reviewed, no issues  Diet Order:  Diet regular Room service appropriate?: Yes; Fluid consistency:: Thin  EDUCATION NEEDS:  No education needs identified at this time   Intake/Output Summary (Last 24 hours) at 09/17/14 1554 Last data filed at 09/17/14 1330  Gross per 24 hour  Intake   1320 ml  Output      0 ml  Net   1320 ml    Last BM:  6/4  Roda Shutters, RDN Pager: [phone] Office: [place]

## 2014-09-18 MED ORDER — DIPHENHYDRAMINE HCL 12.5 MG/5ML PO ELIX
12.5000 mg | ORAL_SOLUTION | Freq: Two times a day (BID) | ORAL | Status: DC
  Administered 2014-09-18 – 2014-09-20 (×4): 12.5 mg via ORAL
  Filled 2014-09-18 (×6): qty 5

## 2014-09-18 MED ORDER — HALOPERIDOL 0.5 MG PO TABS
1.0000 mg | ORAL_TABLET | Freq: Every morning | ORAL | Status: DC
  Administered 2014-09-18 – 2014-09-24 (×7): 1 mg via ORAL
  Filled 2014-09-18 (×8): qty 2

## 2014-09-18 MED ORDER — CLONAZEPAM 0.5 MG PO TABS
0.2500 mg | ORAL_TABLET | Freq: Two times a day (BID) | ORAL | Status: DC
  Administered 2014-09-18 – 2014-09-20 (×4): 0.5 mg via ORAL
  Filled 2014-09-18 (×4): qty 1

## 2014-09-18 NOTE — Progress Notes (Signed)
Patient spent most of the latter part of the evening in her room. Did come to the dayroom at snack time with encouragement. Patient drank 100% of her Ensure. Compliant with medications. Pt cont to endorse feelings of depression. States "Just crazy thoughts. Constant worry about everything."  No attempts to harm self or others. No inappropriate behaviors noted. Will cont to monitor per protocol for continuity of care and safety.

## 2014-09-18 NOTE — Progress Notes (Signed)
Pt quiet, withdrawn to self. Isolates to self and room. Med compliant. No interaction with peers. Limited interaction with staff. Encouraged pt to verbalize feelings. Will continue to assess and monitor for safety.

## 2014-09-18 NOTE — [facility] Group Notes (Signed)
[facility] LCSW Group Therapy  09/18/2014 1:16 PM  Type of Therapy:  Group Therapy  Participation Level:  Active  Participation Quality:  Attentive  Affect:  Appropriate  Cognitive:  Appropriate  Insight:  Engaged  Engagement in Therapy:  Improving  Modes of Intervention:  Discussion, Education, Exploration and Support  Summary of Progress/Problems:Group Therapy  was centered on suicide prevention and intervention and resources in the community several handouts provided to patient. Patient was able to share and follow discussion and reflected on personal experiences. Pt was able to be supportive of peers. Patient remain a little timid and anxious at times but with prompting she is OK  Ian Cavey M 09/18/2014, 1:16 PM

## 2014-09-18 NOTE — Plan of Care (Signed)
Problem: Alteration in mood Goal: LTG-Patient reports reduction in suicidal thoughts (Patient reports reduction in suicidal thoughts and is able to verbalize a safety plan for whenever patient is feeling suicidal)  Outcome: Progressing Currently denies suicidal thoughts.

## 2014-09-18 NOTE — Plan of Care (Signed)
Problem: Ineffective individual coping Goal: STG: Patient will remain free from self harm Outcome: Progressing Patient remains free from self harm. No attempts to harm self or others.

## 2014-09-18 NOTE — Plan of Care (Signed)
Problem: Ineffective individual coping Goal: LTG: Patient will report a decrease in negative feelings Outcome: Not Progressing Pt does not express any feelings Goal: STG: Patient will remain free from self harm Outcome: Progressing No self harm reported or observed

## 2014-09-18 NOTE — Plan of Care (Signed)
Problem: [facility] Participation in Recreation Therapeutic Interventions Goal: STG-Patient will demonstrate improved self esteem by identif STG: Self-Esteem - Within 5 treatment sessions, patient will verbalize at least 5 positive affirmation statements in each of 3 treatment sessions to increase self-esteem post d/c.  Outcome: Completed/Met Date Met:  09/18/14 Treatment Session 3; Completed 3 out of 3: At approximately 9:32 am, LRT met with patient in patient room. Patient verbalized 5 positive affirmation statements. Patient reported, "I just wish it were true." LRT encouraged patient to continue saying positive affirmation statements.  Leonette Monarch, LRT/CTRS 06.09.16 12:52 pm Goal: STG-Other Recreation Therapy Goal (Specify) STG: Stress Management - Within 7 treatment sessions, patient will demonstrate at least one stress management technique in each of 2 treatment sessions to increase stress management skills post d/c.  Outcome: Progressing Treatment Session 3; Completed 0 out of 2: At approximately 9:32 am, LRT met with patient in patient room. Patient reported she read over and practiced the stress management techniques. Patient was not able to demonstrate a technique stating that she forgot how to do the technique. LRT encouraged patient to continue practicing the stress management techniques.  Leonette Monarch, LRT/CTRS 06.09.16 12:55 pm
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
## 2014-09-18 NOTE — [facility] Group Notes (Signed)
[facility] Group Notes:  (Nursing/MHT/Case Management/Adjunct)  Date:  09/18/2014  Time:  3:02 PM  Type of Therapy:  Group Therapy  Participation Level:  Active  Participation Quality:  Appropriate and Attentive  Affect:  Flat  Cognitive:  Alert, Appropriate and Oriented  Insight:  Limited  Engagement in Group:  Limited  Modes of Intervention:  Activity  Summary of Progress/Problems:  Melissa George 09/18/2014, 3:02 PM

## 2014-09-18 NOTE — Progress Notes (Signed)
Recreation Therapy Notes  Date: 06.09.16 Time: 3:00 pm Location: Craft Room  Group Topic: Leisure Education  Goal Area(s) Addresses:  Patient will identify activities for each letter of the alphabet. Patient will verbalize ability to integrate positive leisure into life post d/c. Patient will verbalize ability to use leisure as a Technical sales engineer.  Behavioral Response: Attentive  Intervention: Leisure Alphabet  Activity: Patients were given a worksheet with the alphabet on it and instructed to list healthy leisure activities for each letter of the alphabet.  Education: LRT educated patient on what is needed to participate in leisure  Education Outcome: Acknowledges education/In group clarification offered   Clinical Observations/Feedback: Patient completed approximately 50% of worksheet. Patient contributed to group discussion by stating some healthy leisure activities she listed.  Leonette Monarch, LRT/CTRS 09/18/2014 4:27 PM

## 2014-09-19 ENCOUNTER — Ambulatory Visit: Payer: Medicare Other | Admitting: Psychiatry

## 2014-09-19 MED ORDER — MEGESTROL ACETATE 400 MG/10ML PO SUSP
800.0000 mg | Freq: Every day | ORAL | Status: DC
  Administered 2014-09-20 – 2014-10-28 (×39): 800 mg via ORAL
  Filled 2014-09-19 (×46): qty 20

## 2014-09-19 NOTE — Plan of Care (Signed)
Problem: Alteration in mood Goal: LTG-Patient reports reduction in suicidal thoughts (Patient reports reduction in suicidal thoughts and is able to verbalize a safety plan for whenever patient is feeling suicidal)  Outcome: Not Progressing Continues to profess to passive SI

## 2014-09-19 NOTE — Plan of Care (Signed)
Problem: Ineffective individual coping Goal: STG: Pt will be able to identify effective and ineffective STG: Pt will be able to identify effective and ineffective coping patterns  Attends group without any active participation.  Will answers questions when asked a direct question by the facilitator

## 2014-09-19 NOTE — [facility] Group Notes (Signed)
[place] LCSW Group Therapy  09/19/2014 2:57 PM  Type of Therapy:  Group Therapy  Participation Level:  None  Participation Quality:  Attentive  Affect:  Flat  Cognitive:  Alert  Insight:  Limited  Engagement in Therapy:  None  Modes of Intervention:  Socialization and Support  Summary of Progress/Problems: Patient attend and was attentive during group discussion but did not participate in group discussion.  Carmell Austria T 09/19/2014, 2:57 PM

## 2014-09-19 NOTE — Plan of Care (Signed)
Problem: Ineffective individual coping Goal: STG: Patient will remain free from self harm Outcome: Progressing No self harm. Goal: STG:Pt. will utilize relaxation techniques to reduce stress STG: Patient will utilize relaxation techniques to reduce stress levels  Outcome: Progressing Patient spending time in her room to relax after attending groups.  Goal: STG-Increase in ability to manage activities of daily living Outcome: Progressing Patient able to walk, eat, and toilet herself.

## 2014-09-19 NOTE — Progress Notes (Signed)
Recreation Therapy Notes  Date: 06.10.16 Time: 3:00 pm Location: Craft Room  Group Topic: Communication, Problem solving, teamwork  Goal Area(s) Addresses:  Patient will effectively work with peer towards shared goal. Patient will identify skills used to make activity successful. Patient will identify benefit of using group skills effectively post d/c.  Behavioral Response: Left early  Intervention: [employer]  Activity: Patients were divided into groups and instructed to build the tallest free standing tower out of 15 pipe cleaners. After approximately 4 minutes of building, patients were told they had to put their dominant hand behind their back. After approximately 3 minutes, patients were told they could not talk to each other.  Education: LRT educated patients on how communication, problem solving, and teamwork goes into building a healthy support system.  Education Outcome: Acknowledges education/In group clarification offered   Clinical Observations/Feedback: Patient left group at approximately 3:08 pm with social work. Patient did not return to group.  Leonette Monarch, LRT/CTRS 09/19/2014 4:22 PM
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
## 2014-09-19 NOTE — Plan of Care (Signed)
Problem: [facility] Participation in Recreation Therapeutic Interventions Goal: STG-Other Recreation Therapy Goal (Specify) STG: Stress Management - Within 7 treatment sessions, patient will demonstrate at least one stress management technique in each of 2 treatment sessions to increase stress management skills post d/c.  Outcome: Progressing Treatment Session 4; Completed 0 out of 2: At approximately 12:35 pm, LRT met with patient in patient room. Patient reported she read over and practiced the stress management techniques. Patient was unable to demonstrate a technique. LRT encouraged patient to continue practicing the stress management techniques.  Leonette Monarch, LRT/CTRS 06.10.16 1:50 pm

## 2014-09-19 NOTE — Progress Notes (Signed)
Continues to stay to herself. Appetite has improved. Maintains personal hygiene.  States "I just wish I could disappear"

## 2014-09-19 NOTE — Plan of Care (Signed)
Problem: [facility] Participation in Recreation Therapeutic Interventions Goal: STG-Patient will demonstrate improved self esteem by identif STG: Self-Esteem - With 8 treatment sessions, patient will verbalize at least 5 positive affirmation statements in each of 5 treatment sessions to increase self-esteem post d/c. Outcome: Progressing Treatment Session 1; Completed 1 out of 5: At approximately 12:35 pm, LRT met with patient in patient room. Patient verbalized 5 positive affirmation statements. Patient reported it felt "like I am talking about someone else". LRT encouraged patient to continue saying positive affirmation statements.  Leonette Monarch, LRT/CTRS 06.10.16 1:53 pm

## 2014-09-19 NOTE — BHH Group Notes (Signed)
[facility] LCSW Aftercare Discharge Planning Group Note  09/19/2014 10:18 AM  Participation Quality:  Appropriate and Attentive  Affect:  Flat  Cognitive:  Alert  Insight:  Improving  Engagement in Group:  Lacking  Modes of Intervention:  Socialization and Support  Summary of Progress/Problems: Patient attended group and was attentive AEB body language and eye contact but only participated when asked questions by the facilitator. Patient shared that her SMART goal is to "become more social by spending time in the Day Room".  Carmell Austria T 09/19/2014, 10:18 AM

## 2014-09-20 MED ORDER — DIPHENHYDRAMINE HCL 25 MG PO CAPS
25.0000 mg | ORAL_CAPSULE | Freq: Every day | ORAL | Status: DC
  Administered 2014-09-20 – 2014-09-28 (×9): 25 mg via ORAL
  Filled 2014-09-20 (×9): qty 1

## 2014-09-20 MED ORDER — CLONAZEPAM 0.5 MG PO TABS
0.5000 mg | ORAL_TABLET | Freq: Every day | ORAL | Status: DC
  Administered 2014-09-21 – 2014-09-22 (×2): 0.5 mg via ORAL
  Filled 2014-09-20 (×2): qty 1

## 2014-09-20 NOTE — Plan of Care (Signed)
Problem: Alteration in mood Goal: LTG-Patient reports reduction in suicidal thoughts (Patient reports reduction in suicidal thoughts and is able to verbalize a safety plan for whenever patient is feeling suicidal)  Outcome: Not Progressing Continues to have passive suicidal thoughts

## 2014-09-20 NOTE — BHH Group Notes (Signed)
[place] Group Notes:  (Nursing/MHT/Case Management/Adjunct)  Date:  09/20/2014  Time:  12:48 AM  Type of Therapy:  Group Therapy  Participation Level:  Active  Participation Quality:  Attentive  Affect:  Appropriate  Cognitive:  Appropriate  Insight:  Appropriate  Engagement in Group:  Engaged  Modes of Intervention:  n/a  Summary of Progress/Problems:  Melissa George 09/20/2014, 12:48 AM

## 2014-09-20 NOTE — Plan of Care (Signed)
Problem: Alteration in mood Goal: LTG-Pt's behavior demonstrates decreased signs of depression (Patient's behavior demonstrates decreased signs of depression to the point the patient is safe to return home and continue treatment in an outpatient setting)  Outcome: Not Progressing Continues to rate depression as a 9.  Presents with sad, flat depressed affect
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
## 2014-09-20 NOTE — Plan of Care (Signed)
Problem: Ineffective individual coping Goal: LTG: Patient will report a decrease in negative feelings Outcome: Not Progressing Reports feelings of helplessness and worthlessness.

## 2014-09-20 NOTE — Progress Notes (Signed)
Continues to feel helpless and hopeless. Stays to self.  Appetite improved.  Safety maintained.
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
## 2014-09-20 NOTE — BHH Group Notes (Signed)
[place] Group Notes:  (Nursing/MHT/Case Management/Adjunct)  Date:  09/20/2014  Time:  10:29 AM  Type of Therapy:  Community Meeting   Participation Level:  Minimal  Participation Quality:  Attentive  Affect:  Flat  Cognitive:  Alert, Appropriate and Oriented  Insight:  Improving  Engagement in Group:  Limited  Modes of Intervention:  Discussion  Summary of Progress/Problems:  Melissa George 09/20/2014, 10:29 AM

## 2014-09-20 NOTE — Plan of Care (Signed)
Problem: Ineffective individual coping Goal: LTG: Patient will report a decrease in negative feelings Outcome: Not Progressing Patient states worthlessness and feeling hopeless, depressed.  Goal: STG: Patient will remain free from self harm Outcome: Progressing No self harm. Goal: STG:Pt. will utilize relaxation techniques to reduce stress STG: Patient will utilize relaxation techniques to reduce stress levels  Outcome: Progressing Patient relaxing and utilizing distraction methods to relax.

## 2014-09-21 NOTE — Plan of Care (Signed)
Problem: Ineffective individual coping Goal: STG: Patient will remain free from self harm Outcome: Progressing No self-harm. Goal: STG-Increase in ability to manage activities of daily living Outcome: Progressing Able to complete self-care tasks.

## 2014-09-21 NOTE — Progress Notes (Signed)
Patient is alert and oriented x 4. She feels there has been no change in her feelings or behaviors since admission. Poor response to reality orientation.  She is eating and taking adequate fluids, and taking supplemental drink as ordered by physician.  Patient has been attending groups. She was educated on stress management and agreed to try some of the techniques. Will continue to monitor mood, mental status, and nutrition.

## 2014-09-21 NOTE — BHH Group Notes (Signed)
[place] LCSW Group Therapy  09/21/2014 3:00 PM  Type of Therapy:  Group Therapy  Participation Level:  Active  Participation Quality:  Appropriate and Attentive  Affect:  Depressed  Cognitive:  Alert and Appropriate  Insight:  Improving  Engagement in Therapy:  Engaged  Modes of Intervention:  Discussion, Exploration, Socialization and Support  Summary of Progress/Problems:Emotional Regulation: Patients will identify both negative and positive emotions. They will discuss emotions they have difficulty regulating and how they impact their lives. Patients will be asked to identify healthy coping skills to combat unhealthy reactions to negative emotions.  Charlette reports feeling sad constantly for the last few months. She reports trying to ignore the problem and hoping it goes away. She states she is seeing a therapist but it is challenging.   Eva MSW, LCSWA 09/21/2014, 3:00 PM

## 2014-09-21 NOTE — BHH Group Notes (Signed)
[place] Group Notes:  (Nursing/MHT/Case Management/Adjunct)  Date:  09/21/2014  Time:  1:40 AM  Type of Therapy:  Group Therapy  Participation Level:  Did Not Attend   Summary of Progress/Problems:  Shanine Kreiger [name] Zandra Lajeunesse 09/21/2014, 1:40 AM
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
## 2014-09-21 NOTE — Progress Notes (Signed)
Patient is currently receiving Spiriva 94mg once a day and albuterol nebulizer treatments Q6 prn . Respirtory will continue to follow the Albuterol Q6 prn regimen as part of the respirtory protocol, as this is very appropriate for her treatment of COPD.   Patient has been instructed to inform the staff when she is feeling short of breath and she will receive her Albuterol nebulizer treatment,  This is a good regimen for this patient's COPD.

## 2014-09-22 DIAGNOSIS — F333 Major depressive disorder, recurrent, severe with psychotic symptoms: Secondary | ICD-10-CM | POA: Diagnosis not present

## 2014-09-22 MED ORDER — MIRTAZAPINE 30 MG PO TABS
45.0000 mg | ORAL_TABLET | Freq: Every day | ORAL | Status: DC
  Administered 2014-09-22 – 2014-10-28 (×37): 45 mg via ORAL
  Filled 2014-09-22 (×39): qty 1

## 2014-09-22 MED ORDER — SENNA 8.6 MG PO TABS
1.0000 | ORAL_TABLET | Freq: Every day | ORAL | Status: DC
  Administered 2014-09-22 – 2014-09-24 (×3): 8.6 mg via ORAL
  Filled 2014-09-22 (×3): qty 1

## 2014-09-22 MED ORDER — NICOTINE 21 MG/24HR TD PT24
21.0000 mg | MEDICATED_PATCH | Freq: Every day | TRANSDERMAL | Status: DC
  Administered 2014-09-22 – 2014-10-28 (×34): 21 mg via TRANSDERMAL
  Filled 2014-09-22 (×33): qty 1

## 2014-09-22 NOTE — Progress Notes (Signed)
Melissa George isolates to room.Sad,flat,depressed affect.Minimal interaction with.22[address] complains of shortness of breathe.Denies chest pain.Walking slow.States she has COPD and that she uses oxygen at home as needed.Vital signs 97.6-102-32 135/71 O2 sat 94%.States she is also anxious.Breathing treatment per RT.Tolerated well.States she feels some better.Will continue to monitor closely and asked her to report any further problems with breathing.

## 2014-09-22 NOTE — [facility] Group Notes (Signed)
[facility] Group Notes:  (Nursing/MHT/Case Management/Adjunct)  Date:  09/22/2014  Time:  3:16 PM  Type of Therapy:  Psychoeducational Skills  Participation Level:  Minimal  Participation Quality:  Appropriate  Affect:  Flat  Cognitive:  Alert  Insight:  Good  Engagement in Group:  Lacking and Limited  Modes of Intervention:  Discussion, Education and Support  Summary of Progress/Problems:  Melissa George 09/22/2014, 3:16 PM
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
## 2014-09-22 NOTE — Plan of Care (Signed)
Problem: [facility] Participation in Recreation Therapeutic Interventions Goal: STG-Other Recreation Therapy Goal (Specify) STG: Stress Management - Within 7 treatment sessions, patient will demonstrate at least one stress management technique in each of 2 treatment sessions to increase stress management skills post d/c.  Outcome: Not Progressing Treatment Session 5; Completed 0 out of 2: At approximately 2:10 pm, LRT met with patient in hallway. Patient reported she had not practiced the stress management techniques because she was having trouble breathing and trouble with her arm after she got a shot. LRT encouraged patient to read over and practice the stress management techniques.  Leonette Monarch, LRT/CTRS 06.13.16 2:56 pm  Problem: [facility] Participation in Recreation Therapeutic Interventions Goal: STG-Patient will demonstrate improved self esteem by identif STG: Self-Esteem - With 8 treatment sessions, patient will verbalize at least 5 positive affirmation statements in each of 5 treatment sessions to increase self-esteem post d/c.  Outcome: Progressing Treatment Session 2; Completed 2 out of 5: At approximately 2:10 pm, LRT met with patient in hallway. Patient verbalized 5 positive affirmation statements. Patient reported it felt "unbelievable" and she knows "they aren't true". LRT encouraged patient to continue saying positive affirmation statements.  Leonette Monarch, LRT/CTRS 06.13.16 2:58 pm

## 2014-09-22 NOTE — Plan of Care (Signed)
Problem: Alteration in mood Goal: LTG-Pt's behavior demonstrates decreased signs of depression (Patient's behavior demonstrates decreased signs of depression to the point the patient is safe to return home and continue treatment in an outpatient setting)  Outcome: Progressing Patient is cooperating with staff requests to take meds, eat meals and drink Ensure. She is asking for things she needs, such as a Nicotine patch. Affect is showing more range.

## 2014-09-22 NOTE — Plan of Care (Signed)
Problem: Alteration in mood Goal: LTG-Pt's behavior demonstrates decreased signs of depression (Patient's behavior demonstrates decreased signs of depression to the point the patient is safe to return home and continue treatment in an outpatient setting)  Outcome: Not Progressing Continues to be depressed.Denies SI.

## 2014-09-22 NOTE — Progress Notes (Signed)
Recreation Therapy Notes  Date: 06.13.16 Time: 3:10 pm Location: Craft Room  Group Topic: Self-expression  Goal Area(s) Addresses:  Patient will be able to identify a color that represents each emotion. Patient will verbalize benefit of using art as a means of self-expression. Patient will verbalize one positive emotion experienced while participating in group.  Behavioral Response: Attentive  Intervention: The Colors Within Me  Activity: Patients were given a blank face and instructed to analyze what emotions they were feeling. For each emotion they felt, they were instructed to pick a color and show how much of that emotion they were feeling on the worksheet.  Education: LRT educated patients on different forms of self-expression.   Education Outcome: Acknowledges education/In group clarification offered  Clinical Observations/Feedback: Patient completed activity. Patient did not contribute to group discussion.  Leonette Monarch, LRT/CTRS 09/22/2014 4:40 PM

## 2014-09-22 NOTE — Progress Notes (Signed)
Patient remains quiet and guarded. Slightly paranoid. Affect is blunted. She states that her breathing has improved from yesterday. Denies SI/HI/AVH. Given pneumonia vaccine.

## 2014-09-23 MED ORDER — HALOPERIDOL 0.5 MG PO TABS
1.0000 mg | ORAL_TABLET | Freq: Every day | ORAL | Status: DC
  Administered 2014-09-23 – 2014-09-24 (×2): 1 mg via ORAL
  Filled 2014-09-23 (×2): qty 2

## 2014-09-23 MED ORDER — ALPRAZOLAM 0.25 MG PO TABS
0.2500 mg | ORAL_TABLET | Freq: Three times a day (TID) | ORAL | Status: DC | PRN
  Administered 2014-09-24: 0.25 mg via ORAL
  Filled 2014-09-23: qty 1

## 2014-09-23 MED ORDER — ALPRAZOLAM 0.25 MG PO TABS
0.2500 mg | ORAL_TABLET | Freq: Once | ORAL | Status: AC
  Administered 2014-09-23: 0.25 mg via ORAL
  Filled 2014-09-23: qty 1

## 2014-09-23 NOTE — Plan of Care (Signed)
Problem: Ineffective individual coping Goal: LTG: Patient will report a decrease in negative feelings Outcome: Progressing Denies suicidal feelings. Goal: STG:Pt. will utilize relaxation techniques to reduce stress STG: Patient will utilize relaxation techniques to reduce stress levels  Outcome: Progressing Patient encouraged to walk on unit and to verbalize feelings of anxiety and stress.  Problem: Alteration in mood Goal: STG-Patient is able to discuss feelings and issues (Patient is able to discuss feelings and issues leading to depression)  Outcome: Progressing Spoke with patient about anxiety affecting her breathing. She understands this and will try to work on her anxiety in order to relieve her feeling of not breathing adequately.

## 2014-09-23 NOTE — Plan of Care (Signed)
Problem: [facility] Participation in Recreation Therapeutic Interventions Goal: STG-Other Recreation Therapy Goal (Specify) STG: Stress Management - Within 7 treatment sessions, patient will demonstrate at least one stress management technique in each of 2 treatment sessions to increase stress management skills post d/c.  Outcome: Not Applicable Date Met:  07/86/75 Treatment Session 6; Completed 0 out of 2: At approximately 10:35 am, LRT met with patient in craft room. Patient reported she had not been able to practice the stress management techniques because her glasses broke. LRT offered to give larger print copies, but patient refused stating she needed glasses. LRT encouraged patient to read over and practice the stress management techniques when she got glasses.  Leonette Monarch, LRT/CTRS 06.14.16 12:20 pm  Problem: [facility] Participation in Recreation Therapeutic Interventions Goal: STG-Patient will demonstrate improved self esteem by identif STG: Self-Esteem - With 8 treatment sessions, patient will verbalize at least 5 positive affirmation statements in each of 5 treatment sessions to increase self-esteem post d/c.  Outcome: Progressing Treatment Session 3; Completed 3 out of 5: At approximately 10:35 am, LRT met with patient in craft room. Patient verbalized 5 positive affirmation statements. Patient reported it felt "as always unbelievable". LRT encouraged patient to continue saying positive affirmation statements.  Leonette Monarch, LRT/CTRS 06.14.16 12:22 pm

## 2014-09-23 NOTE — Progress Notes (Signed)
Melissa George pleasant and cooperative.Sad,flat and depressed affect.States depression is 9.5.She seems brighter to writer smiles occasionally.Med compliant.Had breathing treatment at beginning of shift.States she feels some better just jittery.Explained treatment can make her feel that way.Will continue to assess closely and maintain safe environment.

## 2014-09-23 NOTE — Progress Notes (Signed)
Reports decrease in respiratory symptoms. Verbalizes understanding that symptoms may be caused by anxiety, and will report them to her doctor. Remains calm, pleasant and appropriate with staff and peers.

## 2014-09-23 NOTE — [facility] Group Notes (Signed)
[place] Group Notes:  (Nursing/MHT/Case Management/Adjunct)  Date:  09/23/2014  Time:  1:32 AM  Type of Therapy:  Group Therapy  Participation Level:  Active  Participation Quality:  Appropriate  Affect:  Appropriate  Cognitive:  Alert  Insight:  Good  Engagement in Group:  Developing/Improving  Modes of Intervention:  n/a  Summary of Progress/Problems:  Marylynn Pearson 09/23/2014, 1:32 AM
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
## 2014-09-23 NOTE — Progress Notes (Signed)
Patient safe in structured environment . She is still struggling with feelings of depression and anxiety. She is having difficulty taking self responsibility to ease some of her symptoms. She does talk with staff regarding her feelings but is not able to follow through with education regarding ways to cope. Continue to encourage participation in groups and milieu, monitor response to medications, educate and support with skills to manage mood.

## 2014-09-23 NOTE — Progress Notes (Signed)
Nutrition Follow-up  DOCUMENTATION CODES:     INTERVENTION:   (Medical Nutrition Supplement) Continue ensure and mightyshake for added nutrition  NUTRITION DIAGNOSIS:  Inadequate oral intake related to acute illness as evidenced by per patient/family report,improving    GOAL:  Patient will meet greater than or equal to 90% of their needs    MONITOR:   (Energy intake, anthropometric)  REASON FOR ASSESSMENT:  Consult Poor PO  ASSESSMENT:  Pt eating 88% of meals per I  And O sheet since last follow-up  Labs and medications reviewed  Height:  Ht Readings from Last 1 Encounters:  09/13/14 '5\' 1"'$  (1.549 m)    Weight:  Wt Readings from Last 1 Encounters:  09/23/14 82 lb (37.195 kg)   Noted wt gain since admission     Wt Readings from Last 10 Encounters:  09/23/14 82 lb (37.195 kg)  09/11/14 77 lb (34.927 kg)  07/10/14 75 lb 8 oz (34.247 kg)  03/11/14 82 lb 5 oz (37.337 kg)    BMI:  Body mass index is 15.5 kg/(m^2).   Skin:  Reviewed, no issues  Diet Order:  Diet regular Room service appropriate?: Yes; Fluid consistency:: Thin  EDUCATION NEEDS:  No education needs identified at this time   Intake/Output Summary (Last 24 hours) at 09/23/14 1049 Last data filed at 09/23/14 0957  Gross per 24 hour  Intake    720 ml  Output      0 ml  Net    720 ml    LOW Care Level Tanielle Emigh B. Zenia Resides, [place], [place] (pager)

## 2014-09-23 NOTE — Progress Notes (Signed)
Recreation Therapy Notes  Date: 06.14.16 Time: 3:00 pm Location: Craft Room  Group Topic: Goal Setting  Goal Area(s) Addresses:  Patient will list at least one goal. Patient will list at least one obstacle.  Behavioral Response: Attentive, Interactive  Intervention: Recovery Goal Chart  Activity: Patients were instructed to make a goal chart with goals, obstacles, the date they started working on their goal, and the date they achieved their goal.   Education: LRT educated patients on different healthy ways they can celebrate reaching their goal.   Education Outcome: Acknowledges education/In group clarification offered  Clinical Observations/Feedback: Patient completed activity. Patient contributed to group discussion by stating how she can overcome her obstacles.  Leonette Monarch, LRT/CTRS 09/23/2014 4:35 PM

## 2014-09-23 NOTE — Plan of Care (Signed)
Problem: Ineffective individual coping Goal: STG: Patient will remain free from self harm Outcome: Progressing Denies SI.States she is still very depressed.

## 2014-09-23 NOTE — Progress Notes (Signed)
Alert and oriented. Reports shortness of breath that she feels is caused by Haldol. Denies suicidal and homjicidal ideations.

## 2014-09-24 DIAGNOSIS — F22 Delusional disorders: Secondary | ICD-10-CM | POA: Diagnosis present

## 2014-09-24 MED ORDER — HYDROCORTISONE 1 % EX CREA
TOPICAL_CREAM | Freq: Three times a day (TID) | CUTANEOUS | Status: DC
  Administered 2014-09-24 – 2014-10-01 (×12): via TOPICAL
  Filled 2014-09-24 (×2): qty 28

## 2014-09-24 MED ORDER — DOCUSATE SODIUM 100 MG PO CAPS
100.0000 mg | ORAL_CAPSULE | Freq: Two times a day (BID) | ORAL | Status: DC
  Administered 2014-09-24 – 2014-10-02 (×17): 100 mg via ORAL
  Filled 2014-09-24 (×17): qty 1

## 2014-09-24 MED ORDER — SENNA 8.6 MG PO TABS
2.0000 | ORAL_TABLET | Freq: Every day | ORAL | Status: DC
  Administered 2014-09-25 – 2014-10-09 (×14): 17.2 mg via ORAL
  Filled 2014-09-24 (×15): qty 2

## 2014-09-24 NOTE — Progress Notes (Signed)
Recreation Therapy Notes  Date: 06.15.16 Time: 3:00 pm Location: Craft Room  Group Topic: Self-esteem, coping skills  Goal Area(s) Addresses:  Patient will identify positive attributes about self. Patient will identify at least one coping skill.  Behavioral Response: Attentive  Intervention: All About Me  Activity: Patients were instructed to make an All About Me pamphlet including positive traits, healthy coping skills, and their healthy support system.  Education:LRT educated patients on ways to increase their self-esteem.  Education Outcome: Acknowledges education/In group clarification offered  Clinical Observations/Feedback: Patient arrived to group at approximately 3:30 pm. Patient made outline of activity, but did not fill in positive traits, healthy coping skills, or her healthy support system. Patient did not contribute to group discussion.  Leonette Monarch, LRT/CTRS 09/24/2014 4:39 PM

## 2014-09-24 NOTE — Progress Notes (Signed)
Assumed care of patient after handoff report from outgoing nurse, chart reviewed for orders. Patient observed in the her Room sitting up in bed; pleasant on approach, mood and affect upbeat, appropriate, no anxiety, not preoccupied with "worries", anticipated of care discussed; nursing staffs will continue to provide clinical and moral support, engage and encourage to verbalize, express feelings.

## 2014-09-24 NOTE — Progress Notes (Addendum)
Renea Ee, patient's son called after talking to his mother to raise concerns that the patient is having SOB and barely talked with him; called now to confirm that the patient will have oxygen tonight. Dr. Jimmye Norman paged, returned page promptly, ordered oxygen 2.5Lpm, RT called to come and administer Neb treatment.

## 2014-09-24 NOTE — [facility] Group Notes (Signed)
[facility] LCSW Group Therapy  09/24/2014 7:36 AM  Type of Therapy:  Group Therapy  Participation Level:  Active  Participation Quality:  Attentive  Affect:  Appropriate  Cognitive:  Appropriate  Insight:  Engaged  Engagement in Therapy:  Engaged  Modes of Intervention:  Discussion, Education, Exploration and Support  Summary of Progress/Problems:LCSW introduced group rules and today's discussion was on Understanding SELF, patients as a whole group were asked to explore their false beliefs, how they address them to form insight about issues,thoughts or feelings. Each person was asked to reflect on what is important to them and communications styles were discussed to assist with individuals reaching their goals of self discovery. This patient was insightful and supportive of  Peers.This patient was supported after group. She is still feeling hopeless and worries about her future.   Enis Slipper M 09/24/2014, 7:36 AM
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
## 2014-09-24 NOTE — Progress Notes (Signed)
HOB and EOB (End of Bed) raised to comfort, Dr. Jerilee Hoh will order Oxygen 2.2Lpm [place] for bedtime.

## 2014-09-24 NOTE — BHH Group Notes (Signed)
[facility] Group Notes:  (Nursing/MHT/Case Management/Adjunct)  Date:  09/24/2014  Time:  12:12 PM  Type of Therapy:  Psychoeducational Skills  Participation Level:  Active  Participation Quality:  Appropriate, Attentive and Sharing  Affect:  Appropriate  Cognitive:  Alert and Appropriate  Insight:  Appropriate and Good  Engagement in Group:  Engaged and Improving  Modes of Intervention:  Discussion, Education, Problem-solving and Support  Summary of Progress/Problems:  Melissa George Melissa George 09/24/2014, 12:12 PM

## 2014-09-24 NOTE — Progress Notes (Signed)
No BM, reported hemorrhoids, hydrocortisone cream and Docusate given as ordered by MD; Xanax 0.'25mg'$  also given at this time for anxiety will monitor relief.

## 2014-09-24 NOTE — Plan of Care (Signed)
Problem: [facility] Participation in Recreation Therapeutic Interventions Goal: STG-Patient will demonstrate improved self esteem by identif STG: Self-Esteem - With 8 treatment sessions, patient will verbalize at least 5 positive affirmation statements in each of 5 treatment sessions to increase self-esteem post d/c.  Outcome: Progressing Treatment Session 4; Completed 4 out of 5: At approximately 12:00 pm, LRT met with patient in hallway. Patient verbalized 5 positive affirmation statements. Patient reported it felt the "same as every other day". LRT encouraged patient to continue saying positive affirmation statements.  Leonette Monarch, LRT/CTRS 06.15.16 4:52 pm

## 2014-09-24 NOTE — Plan of Care (Signed)
Problem: Spiritual Needs Goal: Ability to function at adequate level Outcome: Progressing Patient able to express needs, less anxious today, talked to the Physician, denied pain, will continue to provide support

## 2014-09-25 MED ORDER — CLONAZEPAM 0.5 MG PO TABS
0.2500 mg | ORAL_TABLET | Freq: Every day | ORAL | Status: DC
  Administered 2014-09-25: 0.25 mg via ORAL
  Administered 2014-09-26: 0.5 mg via ORAL
  Administered 2014-09-27 – 2014-09-28 (×2): 0.25 mg via ORAL
  Filled 2014-09-25 (×5): qty 1

## 2014-09-25 MED ORDER — CLONAZEPAM 0.5 MG PO TABS
0.2500 mg | ORAL_TABLET | Freq: Every day | ORAL | Status: DC | PRN
  Administered 2014-09-26 – 2014-09-29 (×4): 0.25 mg via ORAL
  Filled 2014-09-25 (×4): qty 1

## 2014-09-25 NOTE — Plan of Care (Signed)
Problem: [facility] Participation in Recreation Therapeutic Interventions Goal: STG-Patient will demonstrate improved self esteem by identif STG: Self-Esteem - With 8 treatment sessions, patient will verbalize at least 5 positive affirmation statements in each of 5 treatment sessions to increase self-esteem post d/c.  Outcome: Completed/Met Date Met:  09/25/14 Treatment Session 5; Completed 5 out of 5: At approximately 10:20 am, LRT met with patient in hallway. Patient verbalized 5 positive affirmation statements. Patient reported it felt "same as yesterday" and it was not getting any better. LRT encouraged patient to continue saying positive affirmation statements.  Leonette Monarch, LRT/CTRS 06.16.16 1:25 pm

## 2014-09-25 NOTE — BHH Group Notes (Signed)
[place] Group Notes:  (Nursing/MHT/Case Management/Adjunct)  Date:  09/25/2014  Time:  1:41 AM  Type of Therapy:  Group Therapy  Participation Level:  Active  Participation Quality:  Appropriate  Affect:  Appropriate  Cognitive:  Appropriate  Insight:  Good  Engagement in Group:  Engaged  Modes of Intervention:  n/a  Summary of Progress/Problems:  Melissa George 09/25/2014, 1:41 AM

## 2014-09-25 NOTE — BHH Group Notes (Signed)
[place] [facility] Group Notes:  (Nursing/MHT/Case Management/Adjunct)  Date:  09/25/2014  Time:  2:06 PM  Type of Therapy:  Movement Therapy  Participation Level:  Minimal  Participation Quality:  Appropriate and Attentive  Affect:  Appropriate  Cognitive:  Alert, Appropriate and Oriented  Insight:  Appropriate  Engagement in Group:  Engaged  Modes of Intervention:  Activity  Summary of Progress/Problems:  Melissa George [name] 09/25/2014, 2:06 PM

## 2014-09-25 NOTE — Progress Notes (Signed)
Melissa George has been quiet today, visible in the milieu, interacting fairly well with others, appetite continues to improve, less anxious; medication compliant. Will continue to monitor and maintain a safe environment.

## 2014-09-25 NOTE — BHH Group Notes (Signed)
[place] LCSW Group Therapy  09/25/2014 8:27 AM  Type of Therapy:  Group Therapy  Participation Level:  Active  Participation Quality:  Attentive  Affect:  Appropriate  Cognitive:  Appropriate  Insight:  Developing/Improving  Engagement in Therapy:  Developing/Improving  Modes of Intervention:  Discussion, Education, Exploration and Support  Summary of Progress/Problems:LCSW introduced group rules today's topic was Balance in life and relapse prevention. Patients were asked to reflect on challenges they have once in the community and barriers to their success. Patients were then asked in a supportive collaboration to come up with ideas that could support overcoming their barriers and challenges. Patient reported she herself is a big contributer to her own stress. She reports she worries all the time and that her health and limitations affect her quality of life. She was supported by peers and LCSW  Joana Reamer 09/25/2014, 8:27 AM Group Notes - Nursing/MHT/Case Manager/Adjunct                    Joana Reamer 09/25/2014 8:26 AM

## 2014-09-25 NOTE — Progress Notes (Signed)
Recreation Therapy Notes  Date: 06.16.16 Time: 3:05 pm Location: Craft Room  Group Topic: Coping Skills/Leisure Education  Goal Area(s) Addresses:  Patient will identify things they are grateful for. Patient will identify how being grateful can influence your decision making.  Behavioral Response: Attentive, Interactive  Intervention: Grateful Wheel  Activity: Patients were given an "I Am Grateful For" worksheet and instructed to list at least one thing they were grateful for under each category.   Education: LRT educated patient on leisure and why it is important to implement it into their schedules.  Education Outcome: Acknowledges education/In group clarification offered  Clinical Observations/Feedback: Patient participated in group activity. Patient did not contribute to group discussion.  Leonette Monarch, LRT/CTRS 09/25/2014 4:55 PM

## 2014-09-25 NOTE — Plan of Care (Signed)
Problem: Spiritual Needs Goal: Ability to function at adequate level Outcome: Progressing Encouraged to express feelings, concerns and expectations, denied SI/HI, family are active in patient's care (good support system).  Problem: Ineffective individual coping Goal: STG: Patient will remain free from self harm Outcome: Progressing Patient continues to maintain a safety of self and others; remains on 15 minute check, no harm or injury during this shift

## 2014-09-25 NOTE — Plan of Care (Signed)
Problem: Alteration in mood Goal: STG-Patient reports thoughts of self-harm to staff Outcome: Progressing Patient denies SI/HI 15 minutes checks maintained.
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
## 2014-09-25 NOTE — Progress Notes (Signed)
Alert and oriented x 4, denies pain, affect is flat and sad, but brightens upon approach, on oxygen @ 2.5L/min, oxygen saturation 96% no distress noted. Patient is compliant with medication and attending unit meeting. Patient denies SI/HI, 15 minutes checks maintained will continue to monitor.

## 2014-09-25 NOTE — BHH Group Notes (Signed)
[place] Group Notes:  (Nursing/MHT/Case Management/Adjunct)  Date:  09/25/2014  Time:  10:18 AM  Type of Therapy:  goal   Participation Level:  Active  Participation Quality:  Appropriate and Attentive  Affect:  Appropriate  Cognitive:  Appropriate  Insight:  Appropriate  Engagement in Group:  Limited  Modes of Intervention:  goal setting   Summary of Progress/Problems:  Celso Amy 09/25/2014, 10:18 AM

## 2014-09-25 NOTE — BHH Group Notes (Signed)
[facility] LCSW Aftercare Discharge Planning Group Note  09/25/2014 8:25 AM  Participation Quality:  Attentive  Affect:  Appropriate  Cognitive:  Appropriate  Insight:  Developing/Improving  Engagement in Group:  Developing/Improving  Modes of Intervention:  Discussion, Education and Support  Summary of Progress/Problems: Patients smart goal is to contact my sons and practice relaxation skills through out the day  Dynastee Brummell M 09/25/2014, 8:25 AM

## 2014-09-26 MED ORDER — ENSURE ENLIVE PO LIQD: 237.0000 mL | Freq: Three times a day (TID) | ORAL | Status: DC

## 2014-09-26 MED ORDER — MEGESTROL ACETATE 400 MG/10ML PO SUSP: 800.0000 mg | Freq: Every day | ORAL | Status: DC

## 2014-09-26 MED ORDER — HALOPERIDOL 1 MG PO TABS: 3.0000 mg | ORAL_TABLET | Freq: Every day | ORAL | Status: DC

## 2014-09-26 MED ORDER — ADULT MULTIVITAMIN W/MINERALS CH: 1.0000 | ORAL_TABLET | Freq: Every day | ORAL | Status: DC

## 2014-09-26 MED ORDER — DIPHENHYDRAMINE HCL 25 MG PO CAPS: 25.0000 mg | ORAL_CAPSULE | Freq: Every day | ORAL | Status: DC

## 2014-09-26 MED ORDER — CALCIUM CARBONATE-VITAMIN D 500-200 MG-UNIT PO TABS
1.0000 | ORAL_TABLET | Freq: Every day | ORAL | Status: DC
  Administered 2014-09-27 – 2014-10-08 (×12): 1 via ORAL
  Filled 2014-09-26 (×13): qty 1

## 2014-09-26 MED ORDER — SENNA 8.6 MG PO TABS: 2.0000 | ORAL_TABLET | Freq: Every day | ORAL | Status: DC

## 2014-09-26 MED ORDER — HALOPERIDOL 2 MG PO TABS
3.0000 mg | ORAL_TABLET | Freq: Every day | ORAL | Status: DC
  Administered 2014-09-26 – 2014-09-28 (×3): 3 mg via ORAL
  Filled 2014-09-26 (×3): qty 2

## 2014-09-26 MED ORDER — CALCIUM CARBONATE-VITAMIN D 500-200 MG-UNIT PO TABS: 1.0000 | ORAL_TABLET | Freq: Every day | ORAL | Status: DC

## 2014-09-26 MED ORDER — CLONAZEPAM 0.5 MG PO TABS: 0.2500 mg | ORAL_TABLET | Freq: Two times a day (BID) | ORAL | Status: DC | PRN

## 2014-09-26 MED ORDER — MIRTAZAPINE 45 MG PO TABS: 45.0000 mg | ORAL_TABLET | Freq: Every day | ORAL | Status: DC

## 2014-09-26 NOTE — BHH Group Notes (Signed)
[place] Group Notes:  (Nursing/MHT/Case Management/Adjunct)  Date:  09/26/2014  Time:  1:32 AM  Type of Therapy:  Group Therapy  Participation Level:  Active  Participation Quality:  Appropriate  Affect:  Appropriate  Cognitive:  Appropriate  Insight:  Good  Engagement in Group:  Engaged  Modes of Intervention:  n/a  Summary of Progress/Problems:  Melissa George 09/26/2014, 1:32 AM

## 2014-09-26 NOTE — BHH Group Notes (Signed)
[place] LCSW Group Therapy  09/26/2014 5:12 PM  Type of Therapy:  Group Therapy  Participation Level:  Active  Participation Quality:  Attentive  Affect:  Appropriate  Cognitive:  Appropriate  Insight:  Developing/Improving  Engagement in Therapy:  Developing/Improving  Modes of Intervention:  Discussion, Exploration, Problem-solving and Support  Summary of Progress/Problems: LCSW reviewed group rules with each patient. It was a calm small group and we mad our focus on things we can do to promote our wellness, happiness and relationship obstacles. Patients were asked to share and reflect on personal dating experiences and then support one another with positive affirmations and funny life quotes. Patient did leave early for care conference.   [place] 09/26/2014, 5:12 PM

## 2014-09-26 NOTE — Progress Notes (Signed)
Pleasant and cooperative.  Continues to look sad and stay to herself.  Guarded with conversation with this Probation officer.  Affect blunted.  Complains of anxiety.  Up to day room for meals.  Attending groups.

## 2014-09-26 NOTE — Plan of Care (Signed)
Problem: Alteration in mood Goal: LTG-Patient reports reduction in suicidal thoughts (Patient reports reduction in suicidal thoughts and is able to verbalize a safety plan for whenever patient is feeling suicidal)  Outcome: Progressing Denies SI Goal: STG-Patient reports thoughts of self-harm to staff Outcome: Not Progressing No self harm, no thoughts of self harm verbalized

## 2014-09-26 NOTE — BHH Group Notes (Signed)
[place] Group Notes:  (Nursing/MHT/Case Management/Adjunct)  Date:  09/26/2014  Time:  12:02 PM  Type of Therapy:  Group Therapy  Participation Level:  Did Not Attend   Drake Leach 09/26/2014, 12:02 PM

## 2014-09-26 NOTE — Progress Notes (Signed)
Recreation Therapy Notes  Date: 06.17.16 Time: 3:00 pm Location: Craft Room  Group Topic: Self-expression/coping skills  Goal Area(s) Addresses:  Patient will effectively use art as a means of self-expression. Patient recognize positive benefit for self-expression. Patient will be able to identify one emotion experienced during group session. Patient will identify use of art/self-expression as a coping skill.  Behavioral Response: Left early  Intervention: Two Faces of Me  Activity: Patients were given a blank face worksheet and instructed to draw or write how they felt when they were admitted to the hospital on one said and draw or write how they want to feel when they are d/c on the other side.  Education: LRT educated patients on different forms of self-expression.   Education Outcome: Patient left before LRT started group.  Clinical Observations/Feedback: Patient left at approximately 3:03 pm. Patient did not return to group.  Leonette Monarch, LRT/CTRS 09/26/2014 4:10 PM

## 2014-09-26 NOTE — Plan of Care (Signed)
Problem: Ineffective individual coping Goal: LTG: Patient will report a decrease in negative feelings Outcome: Not Progressing Patient states she still feels "about the same". Continues to state hopeless/helpless/worthless feelings and depression.  Goal: STG: Patient will remain free from self harm Outcome: Progressing No self-harm noted and no thoughts reported.

## 2014-09-26 NOTE — BHH Group Notes (Addendum)
[facility] LCSW Aftercare Discharge Planning Group Note  09/26/2014 11:16 AM  Participation Quality:  Did not attend  Affect:    Cognitive:    Insight:    Engagement in Group:    Modes of Intervention:    Summary of Progress/Problems:  Joana Reamer 09/26/2014, 11:16 AM

## 2014-09-26 NOTE — Progress Notes (Signed)
Patient is pleasant and cooperative but states she does not feel better. She is medication compliant. She has a sitter at night due to her need to use oxygen at night. She notes shortness of breath consistently, but notes she does not feel it to be at an emergent level. She notes she has not typically had SOB and is unsure what is different the past few days.She noted during the wrap up group that she could not relax like she wished because of her difficulty breathing.  No other distress is noted. She continues to state passive SI.
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
## 2014-09-26 NOTE — B[facility] Group Notes (Signed)
Adult Psychoeducational Group Note  Date:  09/26/2014 Time:  11:43 PM  Group Topic/Focus:  Diagnosis Education:   The focus of this group is to discuss the major disorders that patients maybe diagnosed with.  Group discusses the importance of knowing what one's diagnosis is so that one can understand treatment and better advocate for oneself. Wrap-Up Group:   The focus of this group is to help patients review their daily goal of treatment and discuss progress on daily workbooks.  Participation Level:  Minimal  Participation Quality:  Appropriate  Affect:  Flat  Cognitive:  Appropriate  Insight: Limited  Engagement in Group:  Limited  Modes of Intervention:  Discussion  Additional Comments:  N/A  Levonne Spiller 09/26/2014, 11:43 PM

## 2014-09-26 NOTE — Plan of Care (Signed)
Problem: [facility] Participation in Recreation Therapeutic Interventions Goal: STG-Patient will demonstrate improved self esteem by identif STG: Self-Esteem - Within 5 treatment sessions, patient will verbalize at least 5 positive affirmation statements in each of 3 treatment sessions to increase self-esteem post d/c. Outcome: Progressing Treatment Session 1; Completed 1 out of 3: At approximately 11:35 am, LRT met with patient in hallway. Patient verbalized 5 positive affirmation statements. Patient reported it felt "the same as every other day". LRT encouraged patient to continue saying positive affirmation statements.  Leonette Monarch, LRT/CTRS 06.17.16 1:30 pm

## 2014-09-27 NOTE — Progress Notes (Signed)
Patient continues to be withdrawn and states depression. She notes little improvement but remains pleasant. She denies active SI, HI, and AVH. She has a 1:1 sitter for safety as she has needed oxygen to assist with breathing the past few nights. She continues to have difficulty falling asleep. No other needs or distress noted.

## 2014-09-27 NOTE — Plan of Care (Signed)
Problem: Ineffective individual coping Goal: STG: Patient will remain free from self harm Outcome: Progressing No self-harm.

## 2014-09-27 NOTE — Plan of Care (Signed)
Problem: Ineffective individual coping Goal: STG:Pt. will utilize relaxation techniques to reduce stress STG: Patient will utilize relaxation techniques to reduce stress levels  Outcome: Not Progressing Pt not utilizing coping skills to help relax when she feels she can't breathe.   Problem: Alteration in mood Goal: LTG-Patient reports reduction in suicidal thoughts (Patient reports reduction in suicidal thoughts and is able to verbalize a safety plan for whenever patient is feeling suicidal)  Outcome: Progressing Pt denies SI at this time

## 2014-09-27 NOTE — B[facility] Group Notes (Signed)
[place] LCSW Group Therapy  09/27/2014 2:46 PM  Type of Therapy:  Group Therapy  Participation Level:  Minimal  Participation Quality:   Not her usual self, she appeared to be struggling with her breathing today.LCSW mentioned cooncerns to her staff.  Affect:    Cognitive:    Insight:    Engagement in Therapy:    Modes of Intervention:    Summary of Progress/Problems:  Joana Reamer 09/27/2014, 2:46 PM
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
## 2014-09-27 NOTE — Progress Notes (Signed)
D: Pt denies SI/HI/AVH. Pt is pleasant and cooperative. Pt flat blunted and depressed. Pt was observed in bed with HOB elevated about 30 %. Pt informed she could get greater lung expansion with the [facility] elevated. Pt fowards little to writer, pt avoids and is only focused on O2.   A: Pt was offered support and encouragement. Pt was given scheduled medications. Pt was encourage to attend groups. Q 15 minute checks were done for safety. Educated pt on COPD and the dangers of getting too much O2 too fast .  .  R: Pt is taking medication. Pt has no complaints at this time .Pt receptive to treatment and safety maintained on unit.

## 2014-09-27 NOTE — Progress Notes (Signed)
Pt has been pleasant and cooperative. Pt continues to be seclusive to her room. Pt continues to endorse having fleeting thoughts of suicide but denies having a plan. Pt c/o having shortness of breath at times. SPO2 completed with Pt lying in bed results=97%. Assisted Pt to walk in the hall SPO2 went to 94%. Pt was able to attend some unit activities but with limited participation.

## 2014-09-28 ENCOUNTER — Inpatient Hospital Stay: Payer: Medicare Other

## 2014-09-28 ENCOUNTER — Encounter: Payer: Self-pay | Admitting: Internal Medicine

## 2014-09-28 LAB — COMPREHENSIVE METABOLIC PANEL
ALBUMIN: 4.3 g/dL (ref 3.5–5.0)
ALT: 20 U/L (ref 14–54)
ANION GAP: 10 (ref 5–15)
AST: 20 U/L (ref 15–41)
Alkaline Phosphatase: 52 U/L (ref 38–126)
BUN: 36 mg/dL — ABNORMAL HIGH (ref 6–20)
CALCIUM: 9.4 mg/dL (ref 8.9–10.3)
CHLORIDE: 95 mmol/L — AB (ref 101–111)
CO2: 29 mmol/L (ref 22–32)
CREATININE: 0.78 mg/dL (ref 0.44–1.00)
GFR calc Af Amer: >60 mL/min (ref >60)
GFR calc non Af Amer: >60 mL/min (ref >60)
Glucose, Bld: 121 mg/dL — ABNORMAL HIGH (ref 65–99)
Potassium: 4.1 mmol/L (ref 3.5–5.1)
Sodium: 134 mmol/L — ABNORMAL LOW (ref 135–145)
Total Bilirubin: 0.3 mg/dL (ref 0.3–1.2)
Total Protein: 7.3 g/dL (ref 6.5–8.1)

## 2014-09-28 LAB — CBC
HCT: 44.5 % (ref 35.0–47.0)
HEMOGLOBIN: 14.9 g/dL (ref 12.0–16.0)
MCH: 32.5 pg (ref 26.0–34.0)
MCHC: 33.6 g/dL (ref 32.0–36.0)
MCV: 96.6 fL (ref 80.0–100.0)
PLATELETS: 306 10*3/uL (ref 150–440)
RBC: 4.6 MIL/uL (ref 3.80–5.20)
RDW: 13.7 % (ref 11.5–14.5)
WBC: 11.3 10*3/uL — ABNORMAL HIGH (ref 3.6–11.0)

## 2014-09-28 LAB — FERRITIN: Ferritin: 23 ng/mL (ref 11–307)

## 2014-09-28 LAB — TSH: TSH: 3.074 u[IU]/mL (ref 0.350–4.500)

## 2014-09-28 MED ORDER — IPRATROPIUM-ALBUTEROL 0.5-2.5 (3) MG/3ML IN SOLN
3.0000 mL | Freq: Four times a day (QID) | RESPIRATORY_TRACT | Status: DC | PRN
  Administered 2014-10-06 – 2014-10-13 (×2): 3 mL via RESPIRATORY_TRACT
  Filled 2014-09-28 (×3): qty 3

## 2014-09-28 MED ORDER — ALBUTEROL SULFATE (2.5 MG/3ML) 0.083% IN NEBU
2.5000 mg | INHALATION_SOLUTION | RESPIRATORY_TRACT | Status: AC
  Administered 2014-09-28: 2.5 mg via RESPIRATORY_TRACT

## 2014-09-28 NOTE — Consult Note (Addendum)
[facility] NAME: Melissa George    MR#:  [id number]  DATE OF BIRTH:  [birth year]  DATE OF ADMISSION:  09/13/2014   DATE OF CONSULTATION 09/28/2014  PRIMARY CARE PHYSICIAN: No PCP Per Patient   REQUESTING/REFERRING PHYSICIAN: Dr Weber Cooks  CHIEF COMPLAINT:  No chief complaint on file.  Shortness of breath and fatigue  HISTORY OF PRESENT ILLNESS:  Melissa George  is a 64 y.o. female with a known history of COPD, tobacco use, to Takotsubo cardiomyopathy who [facility] for paranoid and delusional thoughts. She is also reporting progressive fatigue, shortness of breath, difficulty taking a deep breath. She is currently on Spiriva and albuterol for COPD with oxygen 2 L at bedtime only. Resting O2 sats on room air 97% with ambulatory sats of 94% on room air. She cough but no sputum production, some wheezing, progressive shortness of breath with any exertion. No fevers or chills no diaphoresis. She is very thin with a BMI of 14.6 on admission but states this is due to depression, decreased oral intake and she has actually been gaining weight during the admission on Megace. Hospitalist services are asked to evaluate her shortness of breath.   PAST MEDICAL HISTORY:   Past Medical History  Diagnosis Date  . Anxiety   . COPD (chronic obstructive pulmonary disease)   . Anxiety   . 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 Hand surgery    . Appendectomy    . Abdominal hysterectomy    . Cesarean section      SOCIAL HISTORY:   History  Substance Use Topics  . Smoking status: Former Research scientist (life sciences)  . Smokeless tobacco: Not on file  . Alcohol Use: No    FAMILY HISTORY:   Family History  Problem Relation Age of Onset  . CAD Mother   . Thyroid cancer Other   . Lung cancer Other     DRUG ALLERGIES:   Allergies  Allergen  Reactions  . Ciprofloxacin Shortness Of Breath and Itching  . Levofloxacin Other (See Comments)    Reaction:  Unknown   . Morphine And Related Nausea And Vomiting  . Sulfa Antibiotics Hives and Itching  . Symbicort [Budesonide-Formoterol Fumarate] Other (See Comments)    Reaction:  Psychotic episode  . Advair Diskus [Fluticasone-Salmeterol] Anxiety    REVIEW OF SYSTEMS:   Review of Systems  Constitutional: Positive for weight loss and malaise/fatigue. Negative for fever, chills and diaphoresis.  HENT: Negative for congestion and hearing loss.   Eyes: Negative for blurred vision and pain.  Respiratory: Positive for cough, shortness of breath and wheezing. Negative for hemoptysis, sputum production and stridor.   Cardiovascular: Negative for chest pain, palpitations, orthopnea and leg swelling.  Gastrointestinal: Negative for nausea, vomiting, abdominal pain, diarrhea, constipation and blood in stool.  Genitourinary: Negative for dysuria and frequency.  Musculoskeletal: Negative for myalgias, back pain, joint pain and neck pain.  Skin: Negative for rash.  Neurological: Positive for weakness. Negative for dizziness, sensory change, speech change, focal weakness, loss of consciousness and headaches.  Endo/Heme/Allergies: Does not bruise/bleed easily.  Psychiatric/Behavioral: Positive for depression. Negative for hallucinations. The patient is nervous/anxious.     MEDICATIONS AT HOME:   Prior to Admission medications   Medication Sig Start Date End Date Taking? Authorizing Provider  acetaminophen (TYLENOL) 325 MG tablet Take [phone]  mg by mouth every 6 (six) hours as needed for mild pain or headache.    Yes Historical Provider, MD  albuterol (PROVENTIL HFA;VENTOLIN HFA) 108 (90 BASE) MCG/ACT inhaler Inhale 1 puff into the lungs every 6 (six) hours as needed for wheezing or shortness of breath.   Yes Historical Provider, MD  buPROPion (WELLBUTRIN XL) 150 MG 24 hr tablet Take 150 mg by  mouth every morning.   Yes Historical Provider, MD  LORazepam (ATIVAN) 0.5 MG tablet Take 0.5 mg by mouth 3 (three) times daily.   Yes Historical Provider, MD  mirtazapine (REMERON) 30 MG tablet Take 30 mg by mouth at bedtime.   Yes Historical Provider, MD  QUEtiapine (SEROQUEL) 25 MG tablet Take 50 mg by mouth 2 (two) times daily.   Yes Historical Provider, MD  tiotropium (SPIRIVA) 18 MCG inhalation capsule Place 18 mcg into inhaler and inhale daily.   Yes Historical Provider, MD  calcium-vitamin D (OSCAL WITH D) 500-200 MG-UNIT per tablet Take 1 tablet by mouth daily with breakfast. 09/27/14   Hildred Priest, MD  clonazePAM (KLONOPIN) 0.5 MG tablet Take 0.5 tablets (0.25 mg total) by mouth 2 (two) times daily as needed for anxiety. 09/26/14   Hildred Priest, MD  diphenhydrAMINE (BENADRYL) 25 mg capsule Take 1 capsule (25 mg total) by mouth at bedtime. 09/26/14   Hildred Priest, MD  feeding supplement, ENSURE ENLIVE, (ENSURE ENLIVE) LIQD Take 237 mLs by mouth 3 (three) times daily between meals. 09/26/14   Hildred Priest, MD  haloperidol (HALDOL) 1 MG tablet Take 3 tablets (3 mg total) by mouth at bedtime. 09/26/14   Hildred Priest, MD  megestrol (MEGACE) 400 MG/10ML suspension Take 20 mLs (800 mg total) by mouth at bedtime. 09/26/14   Hildred Priest, MD  mirtazapine (REMERON) 45 MG tablet Take 1 tablet (45 mg total) by mouth at bedtime. 09/26/14   Hildred Priest, MD  Multiple Vitamin (MULTIVITAMIN WITH MINERALS) TABS tablet Take 1 tablet by mouth daily. 09/26/14   Hildred Priest, MD  senna (SENOKOT) 8.6 MG TABS tablet Take 2 tablets (17.2 mg total) by mouth daily. 09/26/14   Hildred Priest, MD      VITAL SIGNS:  Blood pressure 127/79, pulse 109, temperature 97.5 F (36.4 C), temperature source Oral, resp. rate 20, height '5\' 1"'$  (1.549 m), weight 38.556 kg (85 lb), SpO2 100 %.  PHYSICAL EXAMINATION:   GENERAL:  64 y.o.-year-old patient, Thin, no distress  EYES: Pupils equal, round, reactive to light and accommodation. No scleral icterus. Extraocular muscles intact.  HEENT: Head atraumatic, normocephalic. Oropharynx and nasopharynx clear.  NECK:  Supple, no jugular venous distention. No thyroid enlargement, no tenderness.  LUNGS: Normal breath sounds bilaterally, no wheezing, rales, rhonchi or crepitation. No use of accessory muscles of respiration.  CARDIOVASCULAR: S1, S2 normal. No murmurs, rubs, or gallops.  ABDOMEN: Soft, nontender, nondistended. Bowel sounds present. No organomegaly or mass.  EXTREMITIES: No pedal edema, cyanosis, or clubbing. Peripheral pulses are 2+  NEUROLOGIC: Cranial nerves II through XII are intact. Muscle strength 5/5 in all extremities. Sensation intact. Gait is normal  PSYCHIATRIC: The patient is alert and oriented x 3. Flat affect. Seems depressed  SKIN: No obvious rash, lesion, or ulcer.   LABORATORY PANEL:   CBC No results for input(s): WBC, HGB, HCT, PLT in the last 168 hours. ------------------------------------------------------------------------------------------------------------------  Chemistries  No results for input(s): NA, K, CL, CO2, GLUCOSE, BUN, CREATININE, CALCIUM, MG, AST, ALT, ALKPHOS, BILITOT in the last 168 hours.  Invalid input(s): GFRCGP ------------------------------------------------------------------------------------------------------------------  Cardiac Enzymes No results for input(s): TROPONINI in the last 168 hours. ------------------------------------------------------------------------------------------------------------------  RADIOLOGY:  No results found.  EKG:   Orders placed or performed during the hospital encounter of 03/11/14  . EKG 12-Lead  . EKG 12-Lead  . EKG 12-Lead  . EKG 12-Lead  . EKG 12-Lead  . EKG 12-Lead  . EKG    IMPRESSION AND PLAN:   #1 shortness of breath: We'll check chest x-ray. I  suspect this is due to a combination of COPD and anxiety. Oxygen saturations have been adequate. She has a history of steroid induced psychosis and is not currently on any steroid inhalers, unfortunately this is a mainstay of COPD management and may be necessary and low dose. If chest x-ray is normal would start low-dose Advair. Could also consider overnight pulse oximetry to look for sleep apnea and CT angiogram of the chest to rule out PE. There is no calf tenderness leg swelling or tachycardia. No pleuritic type chest pain. She does have history of cardiomyopathy, without recent 2-D echocardiogram. She does not show any signs of congestive heart failure at this time. No chest pain. She would benefit from outpatient PFTs if this workup is negative.  #2 fatigue: Check TSH. Recheck routine labs, she was not anemic on admission. I see the conversation regarding Lyme's disease and agree with infectious disease that further evaluation and treatment is not necessary. It seems likely that her fatigue is related to her psychiatric diagnoses.  Thank you for this consultation. We will continue to follow with you   CODE STATUS:Full   TOTAL TIME TAKING CARE OF THIS PATIENT: 40 minutes.    Myrtis Ser M.D on 09/28/2014 at 3:34 PM  Between 7am to 6pm - Pager - [phone]  After 6pm go to [email or web address] - password [email or web address] [facility]  [facility]  Office  [phone]  CC: Primary care physician; No PCP Per Patient

## 2014-09-28 NOTE — Progress Notes (Signed)
Pt continues to c/o not feeling well. Pt has been seclusive to her room. Pt's mood and affect has been depressed.Pt continues to attend some unit activities. Pt continues to endorse fleeting thoughts of suicide but denies having a plan.

## 2014-09-28 NOTE — [facility] Group Notes (Signed)
[facility] Group Notes:  (Nursing/MHT/Case Management/Adjunct)  Date:  09/28/2014  Time:  9:50 AM  Type of Therapy:  Goals   Participation Level:  Did Not Attend   Celso Amy 09/28/2014, 9:50 AM

## 2014-09-28 NOTE — [facility] Group Notes (Signed)
Melissa Melissa Group Therapy  09/28/2014 3:08 PM  Type of Therapy:  Group Therapy  Participation Level:  Minimal  Participation Quality:  Attentive  Affect:  Depressed  Cognitive:  Alert  Insight:  Improving  Engagement in Therapy:  Improving  Modes of Intervention:  Discussion, Education, Problem-solving, Socialization and Support  Summary of Progress/Problems:Balance in life: Patients will discuss the concept of balance and how it looks and feels to be unbalanced. Pt will identify areas in their life that is unbalanced and ways to become more balanced. Melissa Melissa identified her children as her support system. She states she would like them to call her more.    Melissa Melissa, MSW, LCSWA 09/28/2014, 3:08 PM
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
## 2014-09-29 MED ORDER — CLONAZEPAM 0.5 MG PO TABS: 0.2500 mg | ORAL_TABLET | Freq: Two times a day (BID) | ORAL | Status: DC

## 2014-09-29 MED ORDER — CLONAZEPAM 0.5 MG PO TABS
0.2500 mg | ORAL_TABLET | Freq: Two times a day (BID) | ORAL | Status: DC
  Administered 2014-09-29 – 2014-10-01 (×5): 0.25 mg via ORAL
  Filled 2014-09-29 (×4): qty 1

## 2014-09-29 MED ORDER — RISPERIDONE 1 MG PO TABS: 1.0000 mg | ORAL_TABLET | Freq: Every day | ORAL | Status: DC

## 2014-09-29 MED ORDER — IPRATROPIUM-ALBUTEROL 0.5-2.5 (3) MG/3ML IN SOLN: 3.0000 mL | Freq: Four times a day (QID) | RESPIRATORY_TRACT | Status: DC | PRN

## 2014-09-29 MED ORDER — DOXYCYCLINE HYCLATE 100 MG PO TABS
100.0000 mg | ORAL_TABLET | Freq: Two times a day (BID) | ORAL | Status: DC
  Administered 2014-09-29 – 2014-10-09 (×21): 100 mg via ORAL
  Filled 2014-09-29 (×23): qty 1

## 2014-09-29 MED ORDER — RISPERIDONE 1 MG PO TABS
1.0000 mg | ORAL_TABLET | Freq: Every day | ORAL | Status: DC
  Administered 2014-09-29 – 2014-10-01 (×3): 1 mg via ORAL
  Filled 2014-09-29 (×3): qty 1

## 2014-09-29 MED ORDER — RISPERIDONE 3 MG PO TABS: 3.0000 mg | ORAL_TABLET | Freq: Every day | ORAL | Status: DC

## 2014-09-29 MED ORDER — RISPERIDONE 3 MG PO TABS
3.0000 mg | ORAL_TABLET | Freq: Every day | ORAL | Status: DC
Start: 2014-09-29 — End: 2014-10-01
  Administered 2014-09-29 – 2014-09-30 (×2): 3 mg via ORAL
  Filled 2014-09-29 (×2): qty 1

## 2014-09-29 NOTE — Consult Note (Signed)
[facility] Infectious Disease     Reason for Consult: Delusional disorder, Possible Lyme disease    Referring Physician: Dr Jerilee Hoh Date of Admission:  09/13/2014   Principal Problem:   Major depressive disorder, recurrent episode, severe Active Problems:   GAD (generalized anxiety disorder)   Anorexia   COPD (chronic obstructive pulmonary disease)   Delusional disorder, persecutory type   Severe recurrent major depression with psychotic features   Fatigue   HPI: Melissa George is a 64 y.o. female with recent diagnosis of a delusional disorder with focus on thoughts that she will lose her job due to [employer], which she has not done, and that will lose her house.  I have spoken with Dr Jerilee Hoh and her son Fredonia Highland.  She does have a history of anxiety but was very functional. She does have a history of COPD and has been admitted for that in the fall of this year. She was treated with levofloxacin and steroids at that time.   She has lost a great deal of wt as well- apparently was 110 #s, in past now down to 80s.  Has poor appetitie, fatigue.   She has been born and raised in [place], no travel outside of the country, does go out of state to [place] where [place] lives. Other son lives in [place] but she does not visit with him.    Past Medical History  Diagnosis Date  . Anxiety   . COPD (chronic obstructive pulmonary disease)   . Anxiety   . 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 Hand surgery    . Appendectomy    . Abdominal hysterectomy    . Cesarean section     History  Substance Use Topics  . Smoking status: Former Research scientist (life sciences)  . Smokeless tobacco: Not on file  . Alcohol Use: No   Family History  Problem Relation Age of Onset  . CAD Mother   . Thyroid cancer Other   . Lung cancer Other     Allergies:  Allergies  Allergen Reactions  . Ciprofloxacin Shortness Of Breath and Itching  . Levofloxacin Other (See Comments)   Reaction:  Unknown   . Morphine And Related Nausea And Vomiting  . Sulfa Antibiotics Hives and Itching  . Symbicort [Budesonide-Formoterol Fumarate] Other (See Comments)    Reaction:  Psychotic episode  . Advair Diskus [Fluticasone-Salmeterol] Anxiety    Current antibiotics: Antibiotics Given (last 72 hours)    None      MEDICATIONS: . calcium-vitamin D  1 tablet Oral Q breakfast  . clonazePAM  0.25 mg Oral BID  . docusate sodium  100 mg Oral BID  . feeding supplement (ENSURE ENLIVE)  237 mL Oral TID BM  . hydrocortisone cream   Topical TID  . megestrol  800 mg Oral QHS  . mirtazapine  45 mg Oral QHS  . multivitamin with minerals  1 tablet Oral Daily  . nicotine  21 mg Transdermal Daily  . risperiDONE  1 mg Oral Daily  . risperiDONE  3 mg Oral QHS  . senna  2 tablet Oral Daily  . tiotropium  18 mcg Inhalation Daily    Review of Systems - 11 systems reviewed and negative per HPI   OBJECTIVE: Temp:  [97.9 F (36.6 C)] 97.9 F (36.6 C) (06/20 0705) Pulse Rate:  [102] 102 (06/20 0705) BP: (113)/(77) 113/77 mmHg (06/20 0705) SpO2:  [96 %] 96 % (06/19  1710) Weight:  [38.556 kg (85 lb)] 38.556 kg (85 lb) (06/20 0641) Physical Exam  Constitutional:  Very thin  HENT: [place]/AT, PERRLA, no scleral icterus Mouth/Throat: Oropharynx is clear and moist. No oropharyngeal exudate.  Cardiovascular: Normal rate, regular rhythm and normal heart sounds. Exam reveals no gallop and no friction rub.  No murmur heard.  Pulmonary/Chest: Effort normal and breath sounds normal. No respiratory distress.  has no wheezes.  Neck  supple, no nuchal rigidity Abdominal: Soft. Bowel sounds are normal.  exhibits no distension. There is no tenderness.  Lymphadenopathy: no cervical adenopathy. No axillary adenopathy Neurological: alert and oriented to person, place, and time.  Skin: Skin is warm and dry. No rash noted. No erythema.  Psychiatric: no agitation, is oriented, able to discuss her history and  case  LABS: Results for orders placed or performed during the hospital encounter of 09/13/14 (from the past 48 hour(s))  CBC     Status: Abnormal   Collection Time: 09/28/14  6:29 PM  Result Value Ref Range   WBC 11.3 (H) 3.6 - 11.0 K/uL   RBC 4.60 3.80 - 5.20 MIL/uL   Hemoglobin 14.9 12.0 - 16.0 g/dL   HCT 44.5 35.0 - 47.0 %   MCV 96.6 80.0 - 100.0 fL   MCH 32.5 26.0 - 34.0 pg   MCHC 33.6 32.0 - 36.0 g/dL   RDW 13.7 11.5 - 14.5 %   Platelets 306 150 - 440 K/uL  Comprehensive metabolic panel     Status: Abnormal   Collection Time: 09/28/14  6:29 PM  Result Value Ref Range   Sodium 134 (L) 135 - 145 mmol/L   Potassium 4.1 3.5 - 5.1 mmol/L   Chloride 95 (L) 101 - 111 mmol/L   CO2 29 22 - 32 mmol/L   Glucose, Bld 121 (H) 65 - 99 mg/dL   BUN 36 (H) 6 - 20 mg/dL   Creatinine, Ser 0.78 0.44 - 1.00 mg/dL   Calcium 9.4 8.9 - 10.3 mg/dL   Total Protein 7.3 6.5 - 8.1 g/dL   Albumin 4.3 3.5 - 5.0 g/dL   AST 20 15 - 41 U/L   ALT 20 14 - 54 U/L   Alkaline Phosphatase 52 38 - 126 U/L   Total Bilirubin 0.3 0.3 - 1.2 mg/dL   GFR calc non Af Amer >60 >60 mL/min   GFR calc Af Amer >60 >60 mL/min    Comment: (NOTE) The eGFR has been calculated using the CKD EPI equation. This calculation has not been validated in all clinical situations. eGFR's persistently <60 mL/min signify possible Chronic Kidney Disease.    Anion gap 10 5 - 15  TSH     Status: None   Collection Time: 09/28/14  6:29 PM  Result Value Ref Range   TSH 3.074 0.350 - 4.500 uIU/mL  Ferritin     Status: None   Collection Time: 09/28/14  6:29 PM  Result Value Ref Range   Ferritin 23 11 - 307 ng/mL   No components found for: ESR, C REACTIVE PROTEIN MICRO: No results found for this or any previous visit (from the past 720 hour(s)).  IMAGING: Dg Chest 2 View  09/28/2014   CLINICAL DATA:  New onset wheezing  EXAM: CHEST  2 VIEW  COMPARISON:  Chest radiograph January 21, 2014 and chest CT January 22, 2014  FINDINGS:  Underlying emphysematous change appears stable. There is a nodular opacity in the right upper lobe measuring 1.5 x 0.6 cm, not appreciably  changed. There is no edema or consolidation. The heart size is within normal limits. Pulmonary vascularity is stable and to some extent reflects underlying emphysematous change. No adenopathy is appreciable. There is lower thoracic levoscoliosis and upper lumbar dextroscoliosis. There are no appreciable bone lesions.  IMPRESSION: Underlying emphysema. Stable nodular opacity right upper lobe. No edema or consolidation.   Electronically Signed   By: Lowella Grip III M.D.   On: 09/28/2014 15:41    Assessment:   Melissa George is a 64 y.o. female with delusions psychosis since Dec of 2015 following admission for COPD exac treated with steroids and levofloxacin.  She was then readmitted with delusions, attributed to possibly steroids. Since then has had persistent psychiatric illness, [facility] at [facility] and [facility].  She is losing wt, has poor appetite, is fixated on losing her house and that she will go to jail for embezzlement Per patient and son she has had several episodes of "bullseye lesions" following tick bites since 1996 and ahs been told it was lyme disease and treated with doxy.   She had Lyme tested at [facility] and this was negative. She has no travel to Lyme endemic areas. Did have a pet dog.    Recommendations I spoke extensively with her son, Fredonia Highland.  I explained I think it is very unlikely that this is lyme disease but agreed to repeat test and start doxy for 10 days.   I am most concerned that following her treatment with a quinolone in Nov for COPD exac she may have developed quinolone associated neurotoxicity. Quinolones have been associated with HA, dizziness, mood alteration, insomnia, peripheral neuropathy. It is unusual that she developed this odd behavior at this age.  She also has lost a great deal of wt. btu CT chest neg at [facility] to eval for possible lung  cancer. Does have COPD which could be contributing to the weight loss.  Will follow Thank you very much for allowing me to participate in the care of this patient. Please call with questions.   Cheral Marker. Ola Spurr, MD

## 2014-09-29 NOTE — Plan of Care (Signed)
Problem: Alteration in mood Goal: LTG-Pt's behavior demonstrates decreased signs of depression (Patient's behavior demonstrates decreased signs of depression to the point the patient is safe to return home and continue treatment in an outpatient setting)  Outcome: Progressing Interacting appropriately and attending unit programs.

## 2014-09-29 NOTE — Plan of Care (Signed)
Problem: [facility] Participation in Recreation Therapeutic Interventions Goal: STG-Patient will demonstrate improved self esteem by identif STG: Self-Esteem - Within 5 treatment sessions, patient will verbalize at least 5 positive affirmation statements in each of 3 treatment sessions to increase self-esteem post d/c.  Outcome: Progressing Treatment Session 2; Completed 2 out of 3: At approximately 12:30 pm, LRT met with patient in patient room. Patient verbalized 5 positive affirmation statements. Patient reported it felt the "samw as before". LRT encouraged patient to continue saying positive affirmation statements and to try to believe the statements.  Leonette Monarch, LRT/CTRS 06.20.16 4:46 pm

## 2014-09-29 NOTE — Progress Notes (Signed)
Patient reports no change in mood since admission. More withdrawn and hopeless. Continues to exhibit delusional thinking and not responsive to reality orientation.  Patient is focused only on "the medications" not working yet and is resistive to teaching regarding skills to assist in mood improvement. COPD symptoms unchanged. Continue current treatment plan, monitor mood, safety, respiratory status.

## 2014-09-29 NOTE — Progress Notes (Signed)
Recreation Therapy Notes  Date: 06.20.16 Time: 3:00 pm Location: Craft Room  Group Topic: Self-expression  Goal Area(s) Addresses:  Patient will identify one color per emotion listed on wheel. Patient will verbalize benefit of using art as a means of self-expression. Patient will verbalize one emotion experienced during session. Patient will be educated on other forms of self-expression.  Behavioral Response: Arrived late  Intervention: Emotion Wheel  Activity: Patients were given a worksheet with 7 different emotions and were instructed to pick a color for each emotion.   Education: LRT educated patient on different forms of self-expression.   Education Outcome: In group clarification offered   Clinical Observations/Feedback: Patient arrived to group at approximately 3:40 pm. Patient did not complete activity. Patient did not contributed to group discussion.   Leonette Monarch, LRT/CTRS 09/29/2014 4:10 PM

## 2014-09-29 NOTE — Progress Notes (Signed)
D: Pt denies SI/HI/AVH. Pt is pleasant and cooperative. "Pt stated she feels less anxious' and she is interacting with peers and staff appropriately.  A: Pt was offered support and encouragement.Pt was encouraged to attend groups, ln addition, was given scheduled medications; on oxygen @ 2.5 L /min via [place] at bedtime, 1:1 at bedside, for safety.,  Q 15 minutes checked maintained   R:Pt attends groups and interacts well with peers and staff. Pt is taking medication. Pt has no complaints.Pt receptive to treatment and safety maintained on unit.

## 2014-09-29 NOTE — Progress Notes (Signed)
[place] at [place] NAME: Melissa George    MR#:  [id number]  DATE OF BIRTH:  1950-12-31  SUBJECTIVE:  CHIEF COMPLAINT:  No chief complaint on file. still sob and using some accessory muscles.  REVIEW OF SYSTEMS:  Review of Systems  Constitutional: Positive for malaise/fatigue. Negative for fever, weight loss and diaphoresis.  HENT: Negative for ear discharge, ear pain, hearing loss, nosebleeds, sore throat and tinnitus.   Eyes: Negative for blurred vision and pain.  Respiratory: Positive for shortness of breath and wheezing. Negative for cough and hemoptysis.   Cardiovascular: Negative for chest pain, palpitations, orthopnea and leg swelling.  Gastrointestinal: Negative for heartburn, nausea, vomiting, abdominal pain, diarrhea, constipation and blood in stool.  Genitourinary: Negative for dysuria, urgency and frequency.  Musculoskeletal: Negative for myalgias and back pain.  Skin: Negative for itching and rash.  Neurological: Positive for weakness. Negative for dizziness, tingling, tremors, focal weakness, seizures and headaches.  Psychiatric/Behavioral: Positive for depression. The patient is not nervous/anxious.    DRUG ALLERGIES:   Allergies  Allergen Reactions  . Ciprofloxacin Shortness Of Breath and Itching  . Levofloxacin Other (See Comments)    Reaction:  Unknown   . Morphine And Related Nausea And Vomiting  . Sulfa Antibiotics Hives and Itching  . Symbicort [Budesonide-Formoterol Fumarate] Other (See Comments)    Reaction:  Psychotic episode  . Advair Diskus [Fluticasone-Salmeterol] Anxiety   VITALS:  Blood pressure 113/77, pulse 102, temperature 97.9 F (36.6 C), temperature source Oral, resp. rate 20, height '5\' 1"'$  (1.549 m), weight 38.556 kg (85 lb), SpO2 96 %. PHYSICAL EXAMINATION:  Physical Exam  Constitutional: She is oriented to person, place, and time and well-developed, well-nourished, and in no distress.   HENT:  Head: Normocephalic and atraumatic.  Eyes: Conjunctivae and EOM are normal. Pupils are equal, round, and reactive to light.  Neck: Normal range of motion. Neck supple. No tracheal deviation present. No thyromegaly present.  Cardiovascular: Normal rate, regular rhythm and normal heart sounds.   Pulmonary/Chest: She is in respiratory distress. She has wheezes. She exhibits no tenderness.  Abdominal: Soft. Bowel sounds are normal. She exhibits no distension. There is no tenderness.  Musculoskeletal: Normal range of motion.  Neurological: She is alert and oriented to person, place, and time. No cranial nerve deficit.  Skin: Skin is warm and dry. No rash noted.  Psychiatric: She exhibits a depressed mood. She has a flat affect.   LABORATORY PANEL:   CBC  Recent Labs Lab 09/28/14 1829  WBC 11.3*  HGB 14.9  HCT 44.5  PLT 306   ------------------------------------------------------------------------------------------------------------------ Chemistries   Recent Labs Lab 09/28/14 1829  NA 134*  K 4.1  CL 95*  CO2 29  GLUCOSE 121*  BUN 36*  CREATININE 0.78  CALCIUM 9.4  AST 20  ALT 20  ALKPHOS 52  BILITOT 0.3   RADIOLOGY:  No results found. ASSESSMENT AND PLAN:   #1 shortness of breath: likely combination of COPD and anxiety. She is agreeable with prednisone pills taper - she denies any issues with that in past. Will order same. Her recent CT chest at [facility] showed irregular right upper lobe calcified pulmonary nodule - will get pulmo c/s for furthur eval including need for repeat CT chest. She has chronic severe emphysema.  #2 fatigue: normal TSH. Her fatigue is likely related to her psychiatric diagnoses.  # Anorexia and Severe major depressive disorder: mgmt per psych   All the records are  reviewed and case discussed with Care Management/Social Workerr. Management plans discussed with the patient, family and they are in agreement.  CODE STATUS: Full  Code  TOTAL TIME TAKING CARE OF THIS PATIENT: 94mnutes.   More than 50% of the time was spent in counseling/coordination of care: YES  POSSIBLE D/C IN 1-2 DAYS, DEPENDING ON CLINICAL CONDITION.   [facility] Henley Boettner M.D on 09/29/2014 at 4:00 PM  Between 7am to 6pm - Pager - [phone]  After 6pm go to [email or web address] - password [email or web address] [facility]ospital[facility]  Office  [phone] CC:  [facility]; [facility]

## 2014-09-30 LAB — B. BURGDORFI ANTIBODIES: B burgdorferi Ab IgG+IgM: <0.91 {ISR} (ref 0.00–0.90)

## 2014-09-30 MED ORDER — PREDNISONE 20 MG PO TABS
20.0000 mg | ORAL_TABLET | Freq: Every day | ORAL | Status: AC
  Administered 2014-10-04: 20 mg via ORAL
  Filled 2014-09-30: qty 1

## 2014-09-30 MED ORDER — PREDNISONE 50 MG PO TABS
50.0000 mg | ORAL_TABLET | Freq: Every day | ORAL | Status: AC
  Administered 2014-10-01: 50 mg via ORAL
  Filled 2014-09-30: qty 1

## 2014-09-30 MED ORDER — PREDNISONE 20 MG PO TABS
40.0000 mg | ORAL_TABLET | Freq: Every day | ORAL | Status: AC
  Administered 2014-10-02: 40 mg via ORAL
  Filled 2014-09-30 (×2): qty 2

## 2014-09-30 MED ORDER — PREDNISONE 50 MG PO TABS: 50.0000 mg | ORAL_TABLET | Freq: Every day | ORAL | Status: DC

## 2014-09-30 MED ORDER — PREDNISONE 10 MG PO TABS
10.0000 mg | ORAL_TABLET | Freq: Every day | ORAL | Status: AC
  Administered 2014-10-05: 10 mg via ORAL
  Filled 2014-09-30: qty 1

## 2014-09-30 MED ORDER — PREDNISONE 10 MG PO TABS
30.0000 mg | ORAL_TABLET | Freq: Every day | ORAL | Status: AC
  Administered 2014-10-03: 30 mg via ORAL
  Filled 2014-09-30: qty 1

## 2014-09-30 MED ORDER — POLYETHYLENE GLYCOL 3350 17 G PO PACK
17.0000 g | PACK | Freq: Every day | ORAL | Status: DC
  Administered 2014-09-30 – 2014-10-02 (×3): 17 g via ORAL
  Filled 2014-09-30 (×3): qty 1

## 2014-09-30 NOTE — Progress Notes (Signed)
Pt has been pleasant and cooperative with care. Appropriate with staff and peers. No negative behaviors noted. VSS. No complaints of shortness of breath. Sats upper 90s. Denies SI, HI, AVH. Med and group complaint. Encouraged pt to verbalize feelings and to come to staff with any issues. Pt receptive. Will continue to assess and monitor for safety.

## 2014-09-30 NOTE — Progress Notes (Signed)
Recreation Therapy Notes  Date: 06.21.16 Time: 3:00 pm Location: Craft Room  Group Topic: Goal Setting   Goal Area(s) Addresses:  Patient will be able to identify one goal. Patient will verbalize benefit of setting goals. Patient will be able to identify at least one positive statement.  Behavioral Response: Attentive, Left Early  Intervention: Step By Step  Activity: Patients were given a worksheet with a foot on it. Patients were instructed to write a goal on the inside of the foot and to write positive statements/advice on the outside of the foot.  Education: LRT educated patients on healthy ways to celebrate achieving their goals.   Education Outcome: Acknowledges education/In group clarification offered  Clinical Observations/Feedback: Patient completed activity by listing a goal and some positive words. Patient left group at approximately 3:20 pm. Patient did not return to group.  Leonette Monarch, LRT/CTRS 09/30/2014 4:10 PM

## 2014-09-30 NOTE — [facility] Group Notes (Signed)
[facility] Group Notes:  (Nursing/MHT/Case Management/Adjunct)  Date:  09/30/2014  Time:  3:19 PM  Type of Therapy:  Psychoeducational Skills  Participation Level:  Minimal  Participation Quality:  Came in late ]  Affect:  Flat  Cognitive:  Appropriate  Insight:  Improving  Engagement in Group:  None  Modes of Intervention:  Support  Summary of Progress/Problems:  Celso Amy 09/30/2014, 3:19 PM

## 2014-09-30 NOTE — Progress Notes (Signed)
[place] at [place] NAME: Melissa George    MR#:  [id number]  DATE OF BIRTH:  10-18-1950  SUBJECTIVE:  CHIEF COMPLAINT:  No chief complaint on file. still sob and using some accessory muscles.  REVIEW OF SYSTEMS:  Review of Systems  Constitutional: Positive for malaise/fatigue. Negative for fever, weight loss and diaphoresis.  HENT: Negative for ear discharge, ear pain, hearing loss, nosebleeds, sore throat and tinnitus.   Eyes: Negative for blurred vision and pain.  Respiratory: Positive for shortness of breath and wheezing. Negative for cough and hemoptysis.   Cardiovascular: Negative for chest pain, palpitations, orthopnea and leg swelling.  Gastrointestinal: Negative for heartburn, nausea, vomiting, abdominal pain, diarrhea, constipation and blood in stool.  Genitourinary: Negative for dysuria, urgency and frequency.  Musculoskeletal: Negative for myalgias and back pain.  Skin: Negative for itching and rash.  Neurological: Positive for weakness. Negative for dizziness, tingling, tremors, focal weakness, seizures and headaches.  Psychiatric/Behavioral: Positive for depression. The patient is not nervous/anxious.    DRUG ALLERGIES:   Allergies  Allergen Reactions  . Ciprofloxacin Shortness Of Breath and Itching  . Levofloxacin Other (See Comments)    Reaction:  Unknown   . Morphine And Related Nausea And Vomiting  . Sulfa Antibiotics Hives and Itching  . Symbicort [Budesonide-Formoterol Fumarate] Other (See Comments)    Reaction:  Psychotic episode  . Advair Diskus [Fluticasone-Salmeterol] Anxiety   VITALS:  Blood pressure 118/75, pulse 121, temperature 98.2 F (36.8 C), temperature source Oral, resp. rate 20, height '5\' 1"'$  (1.549 m), weight 39.009 kg (86 lb), SpO2 97 %. PHYSICAL EXAMINATION:  Physical Exam  Constitutional: She is oriented to person, place, and time and well-developed, well-nourished, and in no distress.   HENT:  Head: Normocephalic and atraumatic.  Eyes: Conjunctivae and EOM are normal. Pupils are equal, round, and reactive to light.  Neck: Normal range of motion. Neck supple. No tracheal deviation present. No thyromegaly present.  Cardiovascular: Normal rate, regular rhythm and normal heart sounds.   Pulmonary/Chest: She is in respiratory distress. She has wheezes. She exhibits no tenderness.  Abdominal: Soft. Bowel sounds are normal. She exhibits no distension. There is no tenderness.  Musculoskeletal: Normal range of motion.  Neurological: She is alert and oriented to person, place, and time. No cranial nerve deficit.  Skin: Skin is warm and dry. No rash noted.  Psychiatric: She exhibits a depressed mood. She has a flat affect.   LABORATORY PANEL:   CBC  Recent Labs Lab 09/28/14 1829  WBC 11.3*  HGB 14.9  HCT 44.5  PLT 306   ------------------------------------------------------------------------------------------------------------------ Chemistries   Recent Labs Lab 09/28/14 1829  NA 134*  K 4.1  CL 95*  CO2 29  GLUCOSE 121*  BUN 36*  CREATININE 0.78  CALCIUM 9.4  AST 20  ALT 20  ALKPHOS 52  BILITOT 0.3   RADIOLOGY:  No results found. ASSESSMENT AND PLAN:   #1 shortness of breath: likely combination of COPD and anxiety. She is agreeable with prednisone pills taper - she denies any issues with that in past. Will order same with 10 mg daily taper starting at 50 mg dose. Her recent CT chest at [facility] showed irregular right upper lobe calcified pulmonary nodule - will get pulmo c/s for furthur eval including need for repeat CT chest. She has chronic severe emphysema.  #2 fatigue: normal TSH. Her fatigue is likely related to her psychiatric diagnoses.  #3 Anorexia and Severe major depressive  disorder: mgmt per psych  #4 Suspected Lyme dz: on doxy, appreciate ID input. Checking titers   All the records are reviewed and case discussed with Care Management/Social  Workerr. Management plans discussed with the patient, family and they are in agreement.  CODE STATUS: Full Code  TOTAL TIME TAKING CARE OF THIS PATIENT: 15 minutes.   More than 50% of the time was spent in counseling/coordination of care: YES  POSSIBLE D/C IN 1-2 DAYS, DEPENDING ON CLINICAL CONDITION.   [facility], Melissa George M.D on 09/30/2014 at 10:52 AM  Between 7am to 6pm - Pager - [phone]  After 6pm go to [email or web address] - password [email or web address] [facility]  [place] [facility]  Office  [phone]  CC:  Primary care physician; [facility]

## 2014-09-30 NOTE — [facility] Group Notes (Signed)
[facility] Group Notes:  (Nursing/MHT/Case Management/Adjunct)  Date:  09/30/2014  Time:  10:49 PM  Type of Therapy:  Group Therapy  Participation Level:  Did Not Attend  Marylynn Pearson 09/30/2014, 10:49 PM
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
## 2014-09-30 NOTE — Plan of Care (Signed)
Problem: [facility] Participation in Recreation Therapeutic Interventions Goal: STG-Patient will demonstrate improved self esteem by identif STG: Self-Esteem - Within 5 treatment sessions, patient will verbalize at least 5 positive affirmation statements in each of 3 treatment sessions to increase self-esteem post d/c.  Outcome: Progressing Treatment Session 3; Completed 2 out of 3: At approximately 10:15 am, LRT met with patient in patient room. Patient verbalized 4 positive affirmation statements. LRT provided assistance with the last one. Patient reported it felt the same. LRT encouraged patient to continue saying positive affirmation statements.  Leonette Monarch, LRT/CTRS 06.21.16 1:29 pm

## 2014-09-30 NOTE — Plan of Care (Signed)
Problem: Ineffective individual coping Goal: STG: Patient will remain free from self harm Outcome: Progressing No self harm reported or observed

## 2014-09-30 NOTE — Progress Notes (Signed)
Nutrition Follow-up    INTERVENTION:   (Medical Nutrition Supplement): continue Ensure supplements  NUTRITION DIAGNOSIS:  Inadequate oral intake related to acute illness as evidenced by per patient/family report. Improving as pt eating 70-100% of meals on regular diet   GOAL:  Patient will meet greater than or equal to 90% of their needs   MONITOR:   (Energy intake, anthropometric)  REASON FOR ASSESSMENT:  Consult Poor PO  ASSESSMENT:  Diet Order: Regular  Energy Intake: recorded po intake 70-100% of meals, also receiving Ensure   Recent Labs Lab 09/28/14 1829  NA 134*  K 4.1  CL 95*  CO2 29  BUN 36*  CREATININE 0.78  CALCIUM 9.4  GLUCOSE 121*    Meds: solumedrol  Height:  Ht Readings from Last 1 Encounters:  09/13/14 '5\' 1"'$  (1.549 m)    Weight:  Wt Readings from Last 1 Encounters:  09/30/14 86 lb (39.009 kg)    Ideal Body Weight:     Wt Readings from Last 10 Encounters:  09/30/14 86 lb (39.009 kg)  09/11/14 77 lb (34.927 kg)  07/10/14 75 lb 8 oz (34.247 kg)  03/11/14 82 lb 5 oz (37.337 kg)    BMI:  Body mass index is 16.26 kg/(m^2).   LOW Care Level  Kerman Passey MS, [place], LDN [phone] Pager

## 2014-10-01 MED ORDER — CLONAZEPAM 0.5 MG PO TABS
0.2500 mg | ORAL_TABLET | Freq: Three times a day (TID) | ORAL | Status: DC
  Administered 2014-10-01: 0.25 mg via ORAL
  Administered 2014-10-02: 0.5 mg via ORAL
  Administered 2014-10-02: 0.25 mg via ORAL
  Filled 2014-10-01 (×3): qty 1

## 2014-10-01 MED ORDER — RISPERIDONE 1 MG PO TABS
1.0000 mg | ORAL_TABLET | Freq: Every day | ORAL | Status: DC
  Administered 2014-10-01 – 2014-10-09 (×9): 1 mg via ORAL
  Filled 2014-10-01 (×9): qty 1

## 2014-10-01 MED ORDER — RISPERIDONE 1 MG PO TABS
1.0000 mg | ORAL_TABLET | Freq: Three times a day (TID) | ORAL | Status: DC
  Administered 2014-10-01 – 2014-10-10 (×26): 1 mg via ORAL
  Filled 2014-10-01 (×27): qty 1

## 2014-10-01 NOTE — Progress Notes (Signed)
Recreation Therapy Notes  Date: 06.22.16 Time: 3:00 pm Location: Craft Room  Group Topic: Self-esteem  Goal Area(s) Addresses:  Patient will be able to identify benefit of self-esteem. Patient will be able to identify ways to increase self-esteem.  Behavioral Response: Did not attend  Intervention: Self-Portrait  Activity: Patients were instructed to draw their self-portrait, write something positive about themselves and their peers, and draw their self-portrait after they read the positive things peers wrote.   Education: LRT educated patients on ways to increase their self-esteem.   Education Outcome: Patient did not attend group.  Clinical Observations/Feedback: Patient did not attend group.   Leonette Monarch, LRT/CTRS 10/01/2014 4:28 PM

## 2014-10-01 NOTE — [facility] Group Notes (Signed)
[facility] Group Notes:  (Nursing/MHT/Case Management/Adjunct)  Date:  10/01/2014  Time:  12:13 PM  Type of Therapy:  Psychoeducational Skills  Participation Level:  Did Not Attend    Celso Amy 10/01/2014, 12:13 PM

## 2014-10-01 NOTE — Plan of Care (Signed)
Problem: Ineffective individual coping Goal: LTG: Patient will report a decrease in negative feelings Outcome: Not Progressing Pt continues to present sad, flat and depressed Goal: STG:Pt. will utilize relaxation techniques to reduce stress STG: Patient will utilize relaxation techniques to reduce stress levels  Outcome: Not Progressing Pt continues to be anxious

## 2014-10-01 NOTE — Progress Notes (Signed)
Melissa [facility] at [facility] NAME: Melissa George    MR#:  [id number]  DATE OF BIRTH:  08-11-1950  SUBJECTIVE:  CHIEF COMPLAINT:  No chief complaint on file. feels somewhat better.  REVIEW OF SYSTEMS:  Review of Systems  Constitutional: Positive for malaise/fatigue. Negative for fever, weight loss and diaphoresis.  HENT: Negative for ear discharge, ear pain, hearing loss, nosebleeds, sore throat and tinnitus.   Eyes: Negative for blurred vision and pain.  Respiratory: Positive for shortness of breath and wheezing. Negative for cough and hemoptysis.   Cardiovascular: Negative for chest pain, palpitations, orthopnea and leg swelling.  Gastrointestinal: Negative for heartburn, nausea, vomiting, abdominal pain, diarrhea, constipation and blood in stool.  Genitourinary: Negative for dysuria, urgency and frequency.  Musculoskeletal: Negative for myalgias and back pain.  Skin: Negative for itching and rash.  Neurological: Positive for weakness. Negative for dizziness, tingling, tremors, focal weakness, seizures and headaches.  Psychiatric/Behavioral: Positive for depression. The patient is not nervous/anxious.    DRUG ALLERGIES:   Allergies  Allergen Reactions  . Ciprofloxacin Shortness Of Breath and Itching  . Levofloxacin Other (See Comments)    Reaction:  Unknown   . Morphine And Related Nausea And Vomiting  . Sulfa Antibiotics Hives and Itching  . Symbicort [Budesonide-Formoterol Fumarate] Other (See Comments)    Reaction:  Psychotic episode  . Advair Diskus [Fluticasone-Salmeterol] Anxiety   VITALS:  Blood pressure 127/56, pulse 108, temperature 98.2 F (36.8 C), temperature source Oral, resp. rate 20, height '5\' 1"'$  (1.549 m), weight 39.463 kg (87 lb), SpO2 97 %. PHYSICAL EXAMINATION:  Physical Exam  Constitutional: She is oriented to person, place, and time and well-developed, well-nourished, and in no distress.  HENT:  Head:  Normocephalic and atraumatic.  Eyes: Conjunctivae and EOM are normal. Pupils are equal, round, and reactive to light.  Neck: Normal range of motion. Neck supple. No tracheal deviation present. No thyromegaly present.  Cardiovascular: Normal rate, regular rhythm and normal heart sounds.   Pulmonary/Chest: She is in respiratory distress. She has wheezes. She exhibits no tenderness.  Abdominal: Soft. Bowel sounds are normal. She exhibits no distension. There is no tenderness.  Musculoskeletal: Normal range of motion.  Neurological: She is alert and oriented to person, place, and time. No cranial nerve deficit.  Skin: Skin is warm and dry. No rash noted.  Psychiatric: She exhibits a depressed George. She has a flat affect.   LABORATORY PANEL:   CBC Recent Labs Lab 09/28/14 1829  WBC 11.3*  HGB 14.9  HCT 44.5  PLT 306   ------------------------------------------------------------------------------------------------------------------ Chemistries  Recent Labs Lab 09/28/14 1829  NA 134*  K 4.1  CL 95*  CO2 29  GLUCOSE 121*  BUN 36*  CREATININE 0.78  CALCIUM 9.4  AST 20  ALT 20  ALKPHOS 52  BILITOT 0.3   ASSESSMENT AND PLAN:   #1 shortness of breath: likely combination of COPD and anxiety. She is agreeable with prednisone pills taper - she denies any issues with that in past. Will order same with 10 mg daily taper starting at 50 mg dose. Her recent CT chest at [facility] showed irregular right upper lobe calcified pulmonary nodule - await pulmo c/s for furthur eval including need for repeat CT chest. She has chronic severe emphysema.  #2 fatigue: normal TSH. Her fatigue is likely related to her psychiatric diagnoses.  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 Checking titers   All the records are reviewed and case discussed with Care Management/Social Workerr. Management plans discussed with the patient,  family and they are in agreement.  CODE STATUS: Full Code  TOTAL TIME TAKING CARE OF THIS PATIENT: 15 minutes.   More than 50% of the time was spent in counseling/coordination of care: Melissa George, Melissa George M.D on 10/01/2014 at 1:02 PM  Between 7am to 6pm - Pager - [phone]  After 6pm go to [email or web address] - password [email or web address] [facility]  [facility]  Office  [phone]  CC:  Primary care physician; [facility]

## 2014-10-01 NOTE — Progress Notes (Signed)
D: Pt is awake and active in the milieu. Pt mood is anxious and her affect is sad. Pt is somewhat isolative, but attending groups.  A: Writer provided emotional support and administered medications as prescribed.   R: Pt continues to be on oxygen during sleep at 2 LPM. Will continue to monitor.
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
## 2014-10-01 NOTE — Progress Notes (Signed)
D: Pt is awake but in room most of shift.  Pt mood is anxious and her affect is sad. Pt is somewhat isolative, did not attend group. Pt 02 sats '@91'$ %, pt complained of shortness of breath.  A: Writer provided emotional support and administered medications as prescribed. Paged Respiratory therapy to administer breathing treatment. Rechecked pt oxygen level  R: Oxygen level at 97 %. No complaints of Shortness of breath. Will continue to monitor

## 2014-10-02 ENCOUNTER — Inpatient Hospital Stay: Payer: Medicare Other

## 2014-10-02 MED ORDER — ALPRAZOLAM 0.25 MG PO TABS
0.2500 mg | ORAL_TABLET | Freq: Three times a day (TID) | ORAL | Status: DC
  Administered 2014-10-02 – 2014-10-09 (×22): 0.25 mg via ORAL
  Filled 2014-10-02 (×24): qty 1

## 2014-10-02 MED ORDER — DOCUSATE SODIUM 100 MG PO CAPS
200.0000 mg | ORAL_CAPSULE | Freq: Two times a day (BID) | ORAL | Status: DC
Start: 2014-10-02 — End: 2014-10-29
  Administered 2014-10-02 – 2014-10-28 (×50): 200 mg via ORAL
  Filled 2014-10-02 (×37): qty 2
  Filled 2014-10-02: qty 1
  Filled 2014-10-02 (×15): qty 2

## 2014-10-02 MED ORDER — MAGNESIUM CITRATE PO SOLN
0.5000 | Freq: Once | ORAL | Status: AC
  Administered 2014-10-02: 0.5 via ORAL
  Filled 2014-10-02: qty 296

## 2014-10-02 NOTE — Progress Notes (Signed)
Observed in her room sitting up in the chair covered up in blanket, no SOB, NAD, denied pain, allowed to express feelings, talked about her support system; anticipated care for today discussed; questions encouraged, will continue with POC and provide feedback.

## 2014-10-02 NOTE — Consult Note (Signed)
I have reviewed CT chest report from [facility], size of nodule not documented, will obtain CT chest without contrast while admitted in [facility].  Will then follow up with patient after reviewing CT chest

## 2014-10-02 NOTE — [facility] Group Notes (Signed)
[place] Group Notes:  (Nursing/MHT/Case Management/Adjunct)  Date:  10/02/2014  Time:  3:51 PM  Type of Therapy:  Movement Therapy  Participation Level:  Did Not Attend  Summary of Progress/Problems:  Melissa George Melissa George 10/02/2014, 3:51 PM

## 2014-10-02 NOTE — Plan of Care (Signed)
Problem: [facility] Participation in Recreation Therapeutic Interventions Goal: STG-Patient will demonstrate improved self esteem by identif STG: Self-Esteem - Within 5 treatment sessions, patient will verbalize at least 5 positive affirmation statements in each of 3 treatment sessions to increase self-esteem post d/c.  Outcome: Completed/Met Date Met:  10/02/14 Treatment Session 4; Completed 3 out of 3: At approximately 9:55 am, LRT met with patient in patient room. Patient verbalized 5 positive affirmation statements. Patient reported it feels the same as before and it is not getting better. LRT encouraged patient to continue saying positive affirmation statements.  Leonette Monarch, LRT/CTRS 06.23.16 1:16 pm
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
## 2014-10-02 NOTE — Plan of Care (Signed)
Problem: Spiritual Needs Goal: Ability to function at adequate level Outcome: Progressing Patient was encouraged to express feelings, continues to be anxious, medication adjusted by Dr Jerilee Hoh: Clonazepam discontinued and Xanax 0.25 mg ordered; explanation provided.

## 2014-10-02 NOTE — [facility] Group Notes (Signed)
[place] Group Notes:  (Nursing/MHT/Case Management/Adjunct)  Date:  10/02/2014  Time:  4:14 AM  Type of Therapy:  Psychoeducational Skills  Participation Level:  Did Not Attend   Kathi Ludwig 10/02/2014, 4:14 AM

## 2014-10-02 NOTE — Progress Notes (Addendum)
[facility] [place] NAME: Melissa George    MR#:  [id number]  DATE OF BIRTH:  [birth year]  SUBJECTIVE:  CHIEF COMPLAINT:  No chief complaint on file. about same. REVIEW OF SYSTEMS:  Review of Systems  Constitutional: Positive for malaise/fatigue. Negative for fever, weight loss and diaphoresis.  HENT: Negative for ear discharge, ear pain, hearing loss, nosebleeds, sore throat and tinnitus.   Eyes: Negative for blurred vision and pain.  Respiratory: Positive for shortness of breath and wheezing. Negative for cough and hemoptysis.   Cardiovascular: Negative for chest pain, palpitations, orthopnea and leg swelling.  Gastrointestinal: Negative for heartburn, nausea, vomiting, abdominal pain, diarrhea, constipation and blood in stool.  Genitourinary: Negative for dysuria, urgency and frequency.  Musculoskeletal: Negative for myalgias and back pain.  Skin: Negative for itching and rash.  Neurological: Positive for weakness. Negative for dizziness, tingling, tremors, focal weakness, seizures and headaches.  Psychiatric/Behavioral: Positive for depression. The patient is not nervous/anxious.    DRUG ALLERGIES:   Allergies  Allergen Reactions  . Ciprofloxacin Shortness Of Breath and Itching  . Levofloxacin Other (See Comments)    Reaction:  Unknown   . Morphine And Related Nausea And Vomiting  . Sulfa Antibiotics Hives and Itching  . Symbicort [Budesonide-Formoterol Fumarate] Other (See Comments)    Reaction:  Psychotic episode  . Advair Diskus [Fluticasone-Salmeterol] Anxiety   VITALS:  Blood pressure 96/54, pulse 119, temperature 98.4 F (36.9 C), temperature source Oral, resp. rate 20, height '5\' 1"'$  (1.549 m), weight 39.917 kg (88 lb), SpO2 90 %. PHYSICAL EXAMINATION:  Physical Exam  Constitutional: She is oriented to person, place, and time and well-developed, well-nourished, and in no distress.  HENT:  Head: Normocephalic and  atraumatic.  Eyes: Conjunctivae and EOM are normal. Pupils are equal, round, and reactive to light.  Neck: Normal range of motion. Neck supple. No tracheal deviation present. No thyromegaly present.  Cardiovascular: Normal rate, regular rhythm and normal heart sounds.   Pulmonary/Chest: She is in respiratory distress. She has wheezes. She exhibits no tenderness.  Abdominal: Soft. Bowel sounds are normal. She exhibits no distension. There is no tenderness.  Musculoskeletal: Normal range of motion.  Neurological: She is alert and oriented to person, place, and time. No cranial nerve deficit.  Skin: Skin is warm and dry. No rash noted.  Psychiatric: She exhibits a depressed mood. She has a flat affect.   LABORATORY PANEL:   CBC  Recent Labs Lab 09/28/14 1829  WBC 11.3*  HGB 14.9  HCT 44.5  PLT 306   ------------------------------------------------------------------------------------------------------------------ Chemistries   Recent Labs Lab 09/28/14 1829  NA 134*  K 4.1  CL 95*  CO2 29  GLUCOSE 121*  BUN 36*  CREATININE 0.78  CALCIUM 9.4  AST 20  ALT 20  ALKPHOS 52  BILITOT 0.3   ASSESSMENT AND PLAN:   #1 shortness of breath: likely combination of COPD and anxiety. on prednisone taper - appreciate pulmo input, requested repeat CT chest here which shows 12 x 7 mm calcified nodule in the lateral right upper lobe, unchanged since 2011, likely benign.  #2 fatigue: normal TSH. Her fatigue is likely related to her psychiatric diagnoses.  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 Checking titers   All the records are reviewed and case discussed with Care Management/Social Workerr. Management plans discussed with the patient, family and  they are in agreement.  CODE STATUS: Full Code  TOTAL TIME TAKING CARE OF THIS PATIENT: 15 minutes.   More than 50% of the time was spent in  counseling/coordination of care: Augustina Mood, Jaicion Laurie M.D on 10/02/2014 at 4:02 PM  Between 7am to 6pm - Pager - [phone]  After 6pm go to [email or web address] - password [email or web address] [facility]  [facility]  Office  [phone]  CC:  Primary care physician; [facility]

## 2014-10-02 NOTE — BHH Group Notes (Signed)
[facility] Group Notes:  (Nursing/MHT/Case Management/Adjunct)  Date:  10/02/2014  Time:  9:14 AM  Type of Therapy:  Community Meeting   Participation Level:  Did Not Attend  Summary of Progress/Problems:  Teyla Skidgel De'Chelle Eldred Sooy 10/02/2014, 9:14 AM

## 2014-10-02 NOTE — Plan of Care (Signed)
Problem: Alteration in mood Goal: LTG-Pt's behavior demonstrates decreased signs of depression (Patient's behavior demonstrates decreased signs of depression to the point the patient is safe to return home and continue treatment in an outpatient setting)  Outcome: Progressing Alert and oriented x 4 denies pain or discomfort

## 2014-10-02 NOTE — Progress Notes (Signed)
Alert and oriented x 4, denies pain or discomfort, denies SOB, oxygen saturation 97% on 2L/min via [place], affect is flat but brightens upon approach, compliant with medication and attending groups.

## 2014-10-02 NOTE — Progress Notes (Signed)
Recreation Therapy Notes  Date: 06.23.16 Time: 3:00 pm Location: Craft Room  Group Topic: Leisure Education  Goal Area(s) Addresses:  Patient will identify one positive leisure activity.  Behavioral Response: Did not attend  Intervention: Leisure Time  Activity: Patients were instructed to write down one positive leisure activity. Patients were instructed to completed "Leisure Time Clock" worksheet. Patients were instructed to list 10 positive emotions as a group. Patients were instructed to match the leisure activities with the emotions.   Education: LRT educated patient on what was needed to participate in leisure.   Education Outcome: Patient did not attend group.  Clinical Observations/Feedback: Patient did not attend group.  Leonette Monarch, LRT/CTRS 10/02/2014 4:18 PM

## 2014-10-03 NOTE — BHH Group Notes (Signed)
[facility] Group Notes:  (Nursing/MHT/Case Management/Adjunct)  Date:  10/03/2014  Time:  11:45 AM  Type of Therapy:  Group Therapy  Participation Level:  Minimal  Participation Quality:  Appropriate and Sharing  Affect:  Flat  Cognitive:  Alert, Appropriate and Oriented  Insight:  Improving  Engagement in Group:  Improving  Modes of Intervention:  Activity  Summary of Progress/Problems:  Melissa George Melissa George 10/03/2014, 11:45 AM

## 2014-10-03 NOTE — Progress Notes (Signed)
[facility] [place] NAME: Melissa George    MR#:  [id number]  DATE OF BIRTH:  [birth year]  SUBJECTIVE:  CHIEF COMPLAINT:  No chief complaint on file. about same. REVIEW OF SYSTEMS:  Review of Systems  Constitutional: Positive for malaise/fatigue. Negative for fever, weight loss and diaphoresis.  HENT: Negative for ear discharge, ear pain, hearing loss, nosebleeds, sore throat and tinnitus.   Eyes: Negative for blurred vision and pain.  Respiratory: Positive for shortness of breath and wheezing. Negative for cough and hemoptysis.   Cardiovascular: Negative for chest pain, palpitations, orthopnea and leg swelling.  Gastrointestinal: Negative for heartburn, nausea, vomiting, abdominal pain, diarrhea, constipation and blood in stool.  Genitourinary: Negative for dysuria, urgency and frequency.  Musculoskeletal: Negative for myalgias and back pain.  Skin: Negative for itching and rash.  Neurological: Positive for weakness. Negative for dizziness, tingling, tremors, focal weakness, seizures and headaches.  Psychiatric/Behavioral: Positive for depression. The patient is not nervous/anxious.    DRUG ALLERGIES:   Allergies  Allergen Reactions  . Ciprofloxacin Shortness Of Breath and Itching  . Levofloxacin Other (See Comments)    Reaction:  Unknown   . Morphine And Related Nausea And Vomiting  . Sulfa Antibiotics Hives and Itching  . Symbicort [Budesonide-Formoterol Fumarate] Other (See Comments)    Reaction:  Psychotic episode  . Advair Diskus [Fluticasone-Salmeterol] Anxiety   VITALS:  Blood pressure 131/76, pulse 123, temperature 98 F (36.7 C), temperature source Oral, resp. rate 20, height '5\' 1"'$  (1.549 m), weight 40.234 kg (88 lb 11.2 oz), SpO2 90 %. PHYSICAL EXAMINATION:  Physical Exam  Constitutional: Melissa George is oriented to person, place, and time and well-developed, well-nourished, and in no distress.  HENT:  Head:  Normocephalic and atraumatic.  Eyes: Conjunctivae and EOM are normal. Pupils are equal, round, and reactive to light.  Neck: Normal range of motion. Neck supple. No tracheal deviation present. No thyromegaly present.  Cardiovascular: Normal rate, regular rhythm and normal heart sounds.   Pulmonary/Chest: Melissa George is in respiratory distress. Melissa George has wheezes. Melissa George exhibits no tenderness.  Abdominal: Soft. Bowel sounds are normal. Melissa George exhibits no distension. There is no tenderness.  Musculoskeletal: Normal range of motion.  Neurological: Melissa George is alert and oriented to person, place, and time. No cranial nerve deficit.  Skin: Skin is warm and dry. No rash noted.  Psychiatric: Melissa George exhibits a depressed mood. Melissa George has a flat affect.   LABORATORY PANEL:   CBC  Recent Labs Lab 09/28/14 1829  WBC 11.3*  HGB 14.9  HCT 44.5  PLT 306   ------------------------------------------------------------------------------------------------------------------ Chemistries   Recent Labs Lab 09/28/14 1829  NA 134*  K 4.1  CL 95*  CO2 29  GLUCOSE 121*  BUN 36*  CREATININE 0.78  CALCIUM 9.4  AST 20  ALT 20  ALKPHOS 52  BILITOT 0.3   ASSESSMENT AND PLAN:   #1 shortness of breath: likely combination of COPD and anxiety. on prednisone taper - appreciate pulmo input, requested repeat CT chest here which shows 12 x 7 mm calcified nodule in the lateral right upper lobe, unchanged since 2011, likely benign.  Outpatient follow-up with [facility] pulmonary recommended for calcified lung nodule  #2 .  Sinus tachycardia: from underlying anxiety, consider small dose of cardizem if need but not much room due to borderline low BP. Can't use metoprolol due to COPD.  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 Checking titers  #5 fatigue: normal TSH. Her fatigue is likely related to her psychiatric diagnoses.  Management plans  discussed with the patient, Dr. Mortimer Fries and Dr. Jerilee Hoh.  At this point, I will sign off.  Please call us if any questions  CODE STATUS: Full Code  TOTAL TIME TAKING CARE OF THIS PATIENT: 15 minutes.   More than 50% of the time was spent in counseling/coordination of care: Augustina Mood, Imunique Samad M.D on 10/03/2014 at 11:25 AM  Between 7am to 6pm - Pager - [phone]  After 6pm go to [email or web address] - password [email or web address] [facility]  [place]  Office  [phone]  CC:  Primary care physician; [facility]

## 2014-10-03 NOTE — BHH Group Notes (Signed)
[facility] Group Notes:  (Nursing/MHT/Case Management/Adjunct)  Date:  10/03/2014  Time:  4:12 AM  Type of Therapy:  Group Therapy  Participation Level:  Minimal  Participation Quality:  Attentive and Drowsy  Affect:  Flat  Cognitive:  Appropriate  Insight:  Appropriate and Limited  Engagement in Group:  Limited  Modes of Intervention:  n/a  Summary of Progress/Problems:  Melissa George 10/03/2014, 4:12 AM

## 2014-10-03 NOTE — BHH Group Notes (Signed)
[facility] LCSW Aftercare Discharge Planning Group Note  10/03/2014 10:52 AM  Participation Quality:  Patient did not attend group.  Affect:  n/a  Cognitive:  n/a  Insight:  n/a  Engagement in Group:  n/a  Modes of Intervention:  n/a  Summary of Progress/Problems:  Carmell Austria T 10/03/2014, 10:52 AM

## 2014-10-03 NOTE — Progress Notes (Signed)
D: Pt is awake and active in the milieu this evening. Pt continues to be somewhat reserved, although she is pleasant and cooperative with staff.  A: Writer administered medications as prescribed, and provided emotional support. Pt reports having a small bowel movement today as well.   R: Pt went to bed shortly after medication administration and is on oxygen 2LPM during the night. A 1:1 sitter is present for safety.

## 2014-10-03 NOTE — BHH Group Notes (Signed)
[facility] LCSW Group Therapy  10/03/2014 4:04 PM  Type of Therapy:  Group Therapy  Participation Level:  Did Not Attend  Modes of Intervention:  Discussion, Education, Problem-solving, Socialization and Support  Summary of Progress/Problems:Feelings around relapse and recovery: Pt will discuss emotions they experience before and after a relapse. Pt will be encouraged to explore feelings around recovery.    [place] MSW, [place] 10/03/2014, 4:04 PM

## 2014-10-03 NOTE — Progress Notes (Signed)
Recreation Therapy Notes  Date: 06.24.16 Time: 3:00 pm Location: Craft Room  Group Topic: Problem Solving, Communication, Teamwork  Goal Area(s) Addresses:  Patient will work in teams towards shared goal. Patient will verbalize skills needed to make activity successful. Patient will verbalize benefit of using skills identified to reach post d/c goals.  Behavioral Response: Did not attend  Intervention: Landing Pad  Activity: Patients were given 15 straws and approximately 2.5 feet of tape and instructed to build a landing pad to catch a golf ball.  Education: LRT educated patients on why communication, teamwork, and problem solving are important.  Education Outcome: Patient did not attend group.  Clinical Observations/Feedback: Patient did not attend group.  Leonette Monarch, LRT/CTRS 10/03/2014 5:04 PM
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
## 2014-10-03 NOTE — Progress Notes (Signed)
D) Patient pleasant and cooperative upon my assessment. Patient did not complete Self Inventory Assessment   Patient denies SI/HI, denies A/V hallucinations.  Patient's affect is blunted and mood is depressed and sad   Reports that her appetite is fair.   A) Patient offered support and encouragement, patient encouraged to discuss feelings/concerns with staff. Patient verbalized understanding. Patient monitored Q15 minutes for safety. Patient met with MD  to discuss today's goals and plan of care.  R) Patient isolates to her room for most of the day  She has not attended groups today.  Patient come to dining room for meals and snacks. Patient appropriate with staff and peers.   Patient taking medications as ordered. Will continue to monitor.

## 2014-10-03 NOTE — Consult Note (Signed)
After further evaluation and further review of CTchest-reports that RUL calcified nodule has been present since 2011 and has NOT changed in size or shape for over 5 years. The likelihood is most likely benign.  Patient has b/l emphysematous changes  I have tried calling the Son Britanee Vanblarcom at [phone] several times and left messages and have NOT heard back.    I have called the Psych nurse today and gave specific instructions to call  me when family arrives at bedside so that I may update them.     Plan for RUL nodule/COPD  1.follow up with [facility] for RUL as needed-most likely benign inn ature 2.patient is allergic to inhaled steroids-so will not start at this time 3.continue prednisone taper as prescribed 4.continue BD therapy as prescribed 5.continue spiriva

## 2014-10-03 NOTE — BHH Group Notes (Signed)
[place] Group Notes:  (Nursing/MHT/Case Management/Adjunct)  Date:  10/03/2014  Time:  11:30 PM  Type of Therapy:  Group Therapy  Participation Level:  Did Not Attend    Melissa George [name] [name] 10/03/2014, 11:30 PM

## 2014-10-04 DIAGNOSIS — F22 Delusional disorders: Secondary | ICD-10-CM

## 2014-10-04 NOTE — Progress Notes (Signed)
Patient remains depressed and presents with sad affect.  Only out of room for medications and meals. Continues to express anxiety and helplessness and hopelessness.  Verbalized that she felt as though she did not have a home to go to once she is discharged but was unable to explain why she felt that way.  Safety maintained.

## 2014-10-04 NOTE — BHH Group Notes (Signed)
[place] Group Notes:  (Nursing/MHT/Case Management/Adjunct)  Date:  10/04/2014  Time:  2:26 PM  Type of Therapy:  Group Therapy  Participation Level:  Did Not Attend  Summary of Progress/Problems:  Melissa George Melissa George 10/04/2014, 2:26 PM

## 2014-10-04 NOTE — BHH Group Notes (Signed)
[place] LCSW Group Therapy  10/04/2014 3:25 PM  Type of Therapy:  Group Therapy  Participation Level:  Did Not Attend  Modes of Intervention:  Discussion, Education, Socialization and Support  Summary of Progress/Problems:Pt will identify unhealthy thoughts and how they impact their emotions and behavior. Pt will be encouraged to discuss these thoughts, emotions and behaviors with the group. Pt will be asked to pick a positive affirmation and discuss what emotion they experienced.    Swati Granberry L Arhan Mcmanamon,MSW, LCSWA  10/04/2014, 3:25 PM

## 2014-10-04 NOTE — Plan of Care (Signed)
Problem: Ineffective individual coping Goal: STG: Patient will remain free from self harm Outcome: Progressing Denies SI/HI, 15 minutes checks maintained.

## 2014-10-04 NOTE — Progress Notes (Signed)
D: Pt denies SI/HI/AVH. Pt is pleasant and cooperative, she appears less anxious and is interacting with peers and staff appropriately.  A: Pt was offered support and encouragement. Pt was given scheduled medications. Pt was encouraged to attend groups. Q 15 minute checks were done for safety.  R:Pt attends groups and interacts well with peers and staff. Pt is taking medication. Pt has no complaints.Pt receptive to treatment and safety maintained on unit.
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
## 2014-10-05 NOTE — Progress Notes (Signed)
Patient ID: Melissa George, female   DOB: September 18, 1950, 64 y.o.   MRN: [id number]  [facility] referral has been made. Per Gae Bon at [facility], referral has been received and is being reviewed.   [place] MSW, [place] 10/05/2014 9:47 AM

## 2014-10-05 NOTE — Progress Notes (Signed)
Patient is reporting increased anxiety of unknown origin. Mood and affect very depressed, anxious. She is still voicing persistent delusional thinking. Not responsive to reality orientation. She does deny SI. Patient not interacting with other patients and reluctant to talk with staff.  Patient is taking all medications as ordered. She was allowed to sit in a chair by the nurses station to help increase feelings of security.  Will continue per treatment plan, monitor mood, mental status, maintain on safety precautions per protocol.

## 2014-10-05 NOTE — Progress Notes (Signed)
Shuffling around in hallways. Has blank, puzzled look on her face. Mumbles incomprehensibly at times. Other times clear and concise. Was in hallway interacting with staff and sat self on the floor. Was assisted by staff and helped uo and to her room for rest. Did come to group and TV room for evening snack. Was placed on oxygen at 2L by nasal cannula at HS with 1:1 sitter in room.

## 2014-10-05 NOTE — Progress Notes (Signed)
Patient has stayed in bed. Awake, alert and oriented. Sad and hopeless but denying thoughts of self harm. Had her nutrition supplement as recommended, had medications and support offered. Safety precautions reinforced.

## 2014-10-05 NOTE — BHH Group Notes (Signed)
[place] LCSW Group Therapy  10/05/2014 3:47 PM  Type of Therapy:  Group Therapy  Participation Level:  Minimal  Participation Quality:  Attentive  Affect:  Anxious  Cognitive:  Alert  Insight:  Limited  Engagement in Therapy:  Limited  Modes of Intervention:  Discussion, Education, Role-play, Socialization and Support  Summary of Progress/Problems:Healthy Communication: Pt will discuss the importance of communication. They will be encouraged to share examples of unhealthy communication they have experienced. Also, they will be encouraged to provide strategies for healthy communication.  [place] attended group briefly. She appeared to be very anxious. She expressed that she was not having a good day and would rather not participate in group.   [place] 10/05/2014, 3:47 PM

## 2014-10-05 NOTE — Progress Notes (Signed)
Patient resting in bed. Oxygen administered @ 2 L as recommended. Patient tolerating well.
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
## 2014-10-06 MED ORDER — TRAZODONE HCL 50 MG PO TABS
50.0000 mg | ORAL_TABLET | Freq: Every day | ORAL | Status: DC
  Administered 2014-10-06: 50 mg via ORAL
  Filled 2014-10-06: qty 1

## 2014-10-06 NOTE — [facility] Group Notes (Signed)
[facility] Group Notes:  (Nursing/MHT/Case Management/Adjunct)  Date:  10/06/2014  Time:  4:59 AM  Type of Therapy:  Evening Wrap-up Group/Outside  Participation Level:  Did Not Attend  Participation Quality:  N/A  Affect:  N/A  Cognitive:  N/A  Insight:  None  Engagement in Group:  None  Modes of Intervention:  Activity  Summary of Progress/Problems:  Levonne Spiller 10/06/2014, 4:59 AM

## 2014-10-06 NOTE — Progress Notes (Signed)
Recreation Therapy Notes  Date: 06.27.16 Time: 3:00 pm Location: Craft Room  Group Topic: Wellness  Goal Area(s) Addresses:  Patient will identify at least one item per dimension of health. Patient will examine areas they are deficient.  Behavioral Response: Did not attend  Intervention: 6 Dimensions of Health  Activity: Patients were given a worksheet with the definitions of the 6 dimensions of health and instructed to read the worksheet. Patients were given a worksheet with the 6 dimensions on it and instructed to list at least one thing they are currently doing in each category.  Education: LRT educated patient on the 6 dimensions of health   Education Outcome: Patient did not attend group.  Clinical Observations/Feedback: Patient did not attend group.  Leonette Monarch, LRT/CTRS 10/06/2014 4:27 PM
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
## 2014-10-06 NOTE — Progress Notes (Signed)
Anxious & depressed,denies suicidal ideation.States "I know something is going to happen.I don't want to talk about that.No body can help it."Stayed in room.Did not attend groups.Compliant with meds.

## 2014-10-06 NOTE — [facility] Group Notes (Signed)
[facility] LCSW Aftercare Discharge Planning Group Note  10/06/2014 5:21 PM  Participation Quality:    Affect:    Cognitive:    Insight:    Engagement in Group:    Modes of Intervention:    Summary of Progress/Problems: patient even with gentle persuasion would not attend any groups today she was in her room tapping her head unresponsive at times with minimal interaction. LCSW notified staff  Enis Slipper M 10/06/2014, 5:21 PM

## 2014-10-06 NOTE — [facility] Group Notes (Signed)
[place] LCSW Group Therapy  10/06/2014 5:20 PM  Type of Therapy:  Group Therapy  Participation Level:  Did Not Attend  Participation Quality:    Affect:    Cognitive:    Insight:    Engagement in Therapy:    Modes of Intervention:    Summary of Progress/Problems:  Joana Reamer 10/06/2014, 5:20 PM

## 2014-10-06 NOTE — Plan of Care (Signed)
Problem: Ineffective individual coping Goal: LTG: Patient will report a decrease in negative feelings Outcome: Progressing Patient able to call staff to express her feelings and needs

## 2014-10-06 NOTE — Plan of Care (Signed)
Problem: Alteration in mood Goal: STG-Patient reports thoughts of self-harm to staff Outcome: Progressing Patient denies suicidal ideation.

## 2014-10-07 LAB — PHOSPHORUS: PHOSPHORUS: 3.4 mg/dL (ref 2.5–4.6)

## 2014-10-07 LAB — BASIC METABOLIC PANEL
Anion gap: 6 (ref 5–15)
BUN: 41 mg/dL — AB (ref 6–20)
CHLORIDE: 101 mmol/L (ref 101–111)
CO2: 32 mmol/L (ref 22–32)
CREATININE: 0.86 mg/dL (ref 0.44–1.00)
Calcium: 9.4 mg/dL (ref 8.9–10.3)
GFR calc non Af Amer: >60 mL/min (ref >60)
GLUCOSE: 88 mg/dL (ref 65–99)
POTASSIUM: 4.1 mmol/L (ref 3.5–5.1)
Sodium: 139 mmol/L (ref 135–145)

## 2014-10-07 LAB — CBC WITH DIFFERENTIAL/PLATELET
Basophils Absolute: 0.1 10*3/uL (ref 0–0.1)
Basophils Relative: 1 %
EOS ABS: 0.1 10*3/uL (ref 0–0.7)
Eosinophils Relative: 2 %
HCT: 43.8 % (ref 35.0–47.0)
HEMOGLOBIN: 14.5 g/dL (ref 12.0–16.0)
LYMPHS ABS: 1.1 10*3/uL (ref 1.0–3.6)
Lymphocytes Relative: 12 %
MCH: 31.9 pg (ref 26.0–34.0)
MCHC: 33 g/dL (ref 32.0–36.0)
MCV: 96.5 fL (ref 80.0–100.0)
MONOS PCT: 7 %
Monocytes Absolute: 0.7 10*3/uL (ref 0.2–0.9)
NEUTROS ABS: 7.4 10*3/uL — AB (ref 1.4–6.5)
Neutrophils Relative %: 78 %
Platelets: 268 10*3/uL (ref 150–440)
RBC: 4.54 MIL/uL (ref 3.80–5.20)
RDW: 13.9 % (ref 11.5–14.5)
WBC: 9.4 10*3/uL (ref 3.6–11.0)

## 2014-10-07 LAB — MAGNESIUM: MAGNESIUM: 2 mg/dL (ref 1.7–2.4)

## 2014-10-07 MED ORDER — CLOZAPINE 25 MG PO TABS
12.5000 mg | ORAL_TABLET | Freq: Every day | ORAL | Status: DC
  Administered 2014-10-07: 12.5 mg via ORAL
  Filled 2014-10-07: qty 1

## 2014-10-07 NOTE — Plan of Care (Signed)
Problem: Ineffective individual coping Goal: STG: Pt will be able to identify effective and ineffective STG: Pt will be able to identify effective and ineffective coping patterns  Outcome: Progressing Pt quiet, non verbal Goal: STG: Patient will remain free from self harm Outcome: Progressing No self harm reported or noted

## 2014-10-07 NOTE — [facility] Group Notes (Signed)
[place] Group Notes:  (Nursing/MHT/Case Management/Adjunct)  Date:  10/07/2014  Time:  2:09 PM  Type of Therapy:  Psychoeducational Skills  Participation Level:  Did Not Attend   Adela Lank [facility] 10/07/2014, 2:09 PM

## 2014-10-07 NOTE — Progress Notes (Signed)
Nutrition Follow-up    INTERVENTION:   (Medical Nutrition Supplement): continue Ensure supplements  NUTRITION DIAGNOSIS:  Inadequate oral intake related to acute illness as evidenced by per patient/family report. Improved.    GOAL:  Patient will meet greater than or equal to 90% of their needs   MONITOR:   (Energy intake, anthropometric)  ASSESSMENT:  Diet Order: Regular  Energy Intake: recorded po intake 50% at breakfast, recorded po intake 75% of meals on average  Height:  Ht Readings from Last 1 Encounters:  09/13/14 '5\' 1"'$  (1.549 m)    Weight:  Wt Readings from Last 1 Encounters:  10/06/14 89 lb (40.37 kg)    Filed Weights   10/05/14 0703 10/05/14 2031 10/06/14 0654  Weight: 88 lb (39.917 kg) 90 lb (40.824 kg) 89 lb (40.37 kg)   Weight appears stable   Wt Readings from Last 10 Encounters:  10/06/14 89 lb (40.37 kg)  09/11/14 77 lb (34.927 kg)  07/10/14 75 lb 8 oz (34.247 kg)  03/11/14 82 lb 5 oz (37.337 kg)    BMI:  Body mass index is 16.83 kg/(m^2).  Skin:  Reviewed, no issues  Diet Order:  Diet regular Room service appropriate?: Yes; Fluid consistency:: Thin  EDUCATION NEEDS:  No education needs identified at this time  [place], [place], LDN [phone] Pager

## 2014-10-07 NOTE — Progress Notes (Signed)
D: Patient denies SI/HI/AVH.  Patient affect and mood are depressed.  Patient seclusive to room throughout the shift.  Patient did NOT attend evening group. Patient visible on the milieu. No distress noted. A: Support and encouragement offered. Scheduled medications given to pt. Q 15 min checks continued for patient safety. R: Patient receptive. Patient remains safe on the unit.

## 2014-10-07 NOTE — Tx Team (Signed)
Interdisciplinary Treatment Plan Update (Adult)  Date:  10/07/2014 Time Reviewed:  5:32 PM  Progress in Treatment: Attending groups: No. Participating in groups:  No. Taking medication as prescribed:  Yes. Tolerating medication:  Yes. Family/Significant othe contact made:  Yes, individual(s) contacted:  Sons, [name] and Fredonia Highland Patient understands diagnosis:  No. and As evidenced by:  denial Discussing patient identified problems/goals with staff:  Yes. Medical problems stabilized or resolved:  Yes. Denies suicidal/homicidal ideation: Yes. Issues/concerns per patient self-inventory:  Yes. Other:  New problem(s) identified: No, Describe:     Discharge Plan or Barriers: Pt will begin Clozaril titration and will eventually be discharged to outpatient medication management and living with her friend Chad Cordial.  Conference call with her sons Fredonia Highland and Itzamar Traynor who verbalize understanding/risks of clozaril treatment.  Reason for Continuation of Hospitalization: Anxiety Delusions  Depression  Comments:Ms. [place] is a 64 year old divorced Caucasian female with a history of major depressive disorder with psychotic features followed by Dr. Jimmye Norman as an outpatient for psychotropic medication management to came to the emergency room secondary to paranoid and delusional thoughts as well as worsening depressive symptoms. The patient has been compliant with Seroquel, Wellbutrin, Ativan and Remeron as prescribed to her by Dr. Jimmye Norman and last saw Dr. Jimmye Norman on May 20. More recently, she has been having recurrent and obsessive thoughts that she is going to jail and that her son is also going to be arrested for not paying hospital bills. She had informed of the psychiatrist in the emergency room that she was also having thoughts that she would be arrested because her manager at work thought that she was stealing money. She denies any current active suicidal thoughts but has had some passive  suicidal thoughts that she should die because she believes that she is about person. She denies any homicidal thoughts. Currently, she denies any auditory or visual hallucinations but did have problems with hallucinations prior to her admission in November. The patient says that one of her sons has been visiting from out of town and there has been some conflict at home as well. She cannot identify any other specific stressors for recent psychotic symptoms. The patient appears cachectic and has not been eating well. She says her appetite has been low. She does report some problems with insomnia as well. The patient is willing to be compliant with medications and outpatient treatment. She is calm and cooperative and there are no signs of agitation or aggression. She denies any history of any heavy alcohol use or illicit drug use but there is questionable overuse of benzodiazepine.   Estimated length of stay: 7 days  New goal(s): Titrate clozaril to therapeutic dose.  Review of initial/current patient goals per problem list:   SEE PLAN OF CARE Attendees: Patient:  Melissa George 6/28/20165:32 PM  Family:   6/28/20165:32 PM  Physician:  Jerilee Hoh 6/28/20165:32 PM  Nursing:    6/28/20165:32 PM  Case Manager:   6/28/20165:32 PM  Counselor:   6/28/20165:32 PM  Other:  August Saucer, LCSW 6/28/20165:32 PM  Other:  Everitt Amber LRT 6/28/20165:32 PM  Other:   6/28/20165:32 PM  Other:  6/28/20165:32 PM  Other:  6/28/20165:32 PM  Other:  6/28/20165:32 PM  Other:  6/28/20165:32 PM  Other:  6/28/20165:32 PM  Other:  6/28/20165:32 PM  Other:   6/28/20165:32 PM   Scribe for Treatment Team:   Dossie Arbour PLCSW , 10/07/2014, 5:32 PM
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
## 2014-10-07 NOTE — Progress Notes (Signed)
Recreation Therapy Notes  Date: 06.28.16 Time: 3:00 pm Location: Craft Room  Group Topic: Problem Solving, Teamwork, Communication  Goal Area(s) Addresses:  Patient will effectively work with peer towards shared goal. Patient will identify skills used to make activity successful. Patient will identify benefit of using group skills effectively post d/c.  Behavioral Response: Did not attend  Intervention: [employer]  Activity: Patients were instructed to build a free standing tower with 15 pipe cleaners. Patients were given 2 minutes to strategize. After patients had been building for approximately 5 minutes, LRT instructed patients to put their dominate hand behind their back. After approximately another 5 minutes of building, LRT instructed patients that they could not talk to each other.  Education: LRT educated patient on why communication, problem solving, and teamwork is important.   Education Outcome: Patient did not attend group.  Clinical Observations/Feedback: Patient did not attend group.  Leonette Monarch, LRT/CTRS 10/07/2014 4:18 PM

## 2014-10-07 NOTE — Progress Notes (Signed)
Pt quiet and withdrawn this morning. Would not answer questions or express feelings.  Encouraged pt to discuss what was wrong, and to verbalize emotions  Pr just continued to stare and not comment. Will continue to assess and monitor for safety.

## 2014-10-08 MED ORDER — CHOLECALCIFEROL 400 UNIT/ML PO LIQD
400.0000 [IU] | Freq: Every day | ORAL | Status: DC
  Administered 2014-10-08 – 2014-10-09 (×2): 400 [IU] via ORAL
  Filled 2014-10-08 (×4): qty 1

## 2014-10-08 MED ORDER — CLOZAPINE 25 MG PO TABS
12.5000 mg | ORAL_TABLET | Freq: Every day | ORAL | Status: DC
  Administered 2014-10-09: 12.5 mg via ORAL
  Filled 2014-10-08 (×2): qty 1

## 2014-10-08 MED ORDER — CLOZAPINE 25 MG PO TABS
25.0000 mg | ORAL_TABLET | Freq: Every day | ORAL | Status: DC
  Administered 2014-10-08: 25 mg via ORAL
  Filled 2014-10-08: qty 1

## 2014-10-08 NOTE — Plan of Care (Signed)
Problem: Spiritual Needs Goal: Ability to function at adequate level Outcome: Not Progressing Pt unstable at present  Problem: Ineffective individual coping Goal: LTG: Patient will report a decrease in negative feelings Outcome: Progressing Increase in negative feelings. Pt states shes doomed

## 2014-10-08 NOTE — Progress Notes (Signed)
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 continued to have shortness of breath.  Albuterol was changed to duoned.  Prednisone taper was started/completed on 6/26.  Internal medicine was concerned about nodule lesion found on CT completed at [facility]. They involved pulmonary.  Chest CT repeated.  Pulmonology feels nodule has been w/o change since 2011. Likely benign.  ID: Family concern with possible Lyme's disease. Infectious disease evaluated the patient on 6/20. They repeated Lyme serology  (results were neg)and started a trial of doxycycline for 10 days which she will complete tomorrow.  ID feels is very unlikely patient is actually suffering from Lyme's disease.  Tobacco use disorder: Continue nicotine patch  Constipation and hemorrhoids: continue Colace but will increase to 200 mg by mouth twice a day and Senokot to 2 tablets by mouth daily at bedtime. Prune juice ordered with every meal.Continue Preparation H 3 times a day.    Disposition: Family is very concerned about the patient's ability to maintain her stability after discharge. They are unable to afford the cost of a assisted  living facility. They are unable to have the patient live with them.  [place] are also out of the [place] and there is no other family that can help.  As of now our discharge plan includes: Patient will moving with her friend and will continue to receive home health.  Once discharged a copy of her discharge summary will be faxed to her primary care provider and her home health agency:  Primary care :Dr.White-PCP on 08-20-14 at 10:50am [facility] [address] [place], [place] [address]  [phone]  Resources and Referrals  [place] @ [phone] Spoke with [facility] onsite liaison @ [phone] Referral accepted for [facility] for [facility] RN on 08/06/14 with PT, SW, and Dietitian to follow [facility] referral and documentation faxed to [facility] via [employer]. Fax # [phone]  Medical Decision Making:  Established Problem, Stable/Improving (1), Review of Psycho-Social Stressors (1), Review or order clinical lab tests (1), Order AIMS Test (2) and Review of Medication Regimen & Side Effects (2)    Hildred Priest 10/08/2014, 3:52 PM

## 2014-10-08 NOTE — Progress Notes (Signed)
Pt cont to endorse feelings of depression. When asked to rate her mood on a scale of 0 to 10, patient states, "20." When asked if this is the worse she has ever felt?, pt states "yes." Patient says she is depressed about life. Remains isolative to her room. Is compliant with hs medications. Drank ensure tonight. Denies si/hi or avh. No attempts to harm self or others. Says she wouldn't know where to begin to hurt herself. O2 '@2LPM'$  via n/c.  Pt is being monitored 1:1 for safety while O2 is in use. Pt c/o SOB. O2 sat 96%. Encouraged to take some deep breaths and attempt to relax. Offered nebulizer treatment, but pt refused. Will cont to monitor and offer support.

## 2014-10-08 NOTE — Plan of Care (Signed)
Problem: Ineffective individual coping Goal: STG: Patient will remain free from self harm Outcome: Progressing Pt has not had any attempts to harm self or others.

## 2014-10-08 NOTE — Progress Notes (Signed)
MEDICATION RELATED CONSULT NOTE - INITIAL   Pharmacy Consult for Clozaril Monitoring  Allergies  Allergen Reactions  . Ciprofloxacin Shortness Of Breath and Itching  . Levofloxacin Other (See Comments)    Reaction:  Unknown   . Morphine And Related Nausea And Vomiting  . Sulfa Antibiotics Hives and Itching  . Symbicort [Budesonide-Formoterol Fumarate] Other (See Comments)    Reaction:  Psychotic episode  . Advair Diskus [Fluticasone-Salmeterol] Anxiety    CBC  Recent Labs  10/07/14 1748  WBC 9.4  HGB 14.5  HCT 43.8  PLT 268  CREATININE 0.86  MG 2.0  PHOS 3.4   ANC 6/28: 7.4  Estimated Creatinine Clearance: 41.6 mL/min (by C-G formula based on Cr of 0.86).  Medical History: Past Medical History  Diagnosis Date  . Anxiety   . COPD (chronic obstructive pulmonary disease)   . Anxiety   . 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 ALPRAZolam  0.25 mg Oral TID WC  . calcium-vitamin D  1 tablet Oral Q breakfast  . cloZAPine  12.5 mg Oral QHS  . docusate sodium  200 mg Oral BID  . doxycycline  100 mg Oral Q12H  . feeding supplement (ENSURE ENLIVE)  237 mL Oral TID BM  . hydrocortisone cream   Topical TID  . megestrol  800 mg Oral QHS  . mirtazapine  45 mg Oral QHS  . multivitamin with minerals  1 tablet Oral Daily  . nicotine  21 mg Transdermal Daily  . risperiDONE  1 mg Oral TID WC  . risperiDONE  1 mg Oral QHS  . senna  2 tablet Oral Daily  . tiotropium  18 mcg Inhalation Daily    Assessment: Pharmacy consulted to monitor and report clozapine for REMS program in this 64 year old female. Patient initiating clozapine for delusional disorder (off label). Clozapine 12.'5mg'$  QHS started 6/28  Plan:  ANC: 7.4, acceptable for dispensing. However, patient is NOT enrolled in clozapine rems program per database query. Will forward registry information to MD to register patient  Will recheck labs 7/6  Rexene Edison, PharmD Clinical  Pharmacist 10/08/2014,8:09 AM

## 2014-10-08 NOTE — Plan of Care (Signed)
Problem: Alteration in mood Goal: STG-Patient reports thoughts of self-harm to staff Outcome: Progressing Currently denies suicidal thoughts or thoughts of self harm.

## 2014-10-08 NOTE — Progress Notes (Signed)
Pt cont to endorse feelings of depression and impending doom. Pt states nothing can help her. Pt refused medications then took. " Patient says she is depressed about life states she has no money to bury her sons with. Pt informed Probation officer that her sons are still alive, she agrees but continues to ramble about no money to bury sons.. Remains isolative to her room. Denies si/hi or avh. No attempts to harm self or others. Pt isolative to room. No interaction with peers, minimal interaction with staff. Will cont to monitor and offer support.

## 2014-10-09 MED ORDER — CLOZAPINE 25 MG PO TABS
50.0000 mg | ORAL_TABLET | Freq: Every day | ORAL | Status: DC
  Administered 2014-10-09: 50 mg via ORAL
  Filled 2014-10-09: qty 2

## 2014-10-09 NOTE — Progress Notes (Signed)
MEDICATION RELATED CONSULT NOTE - Follow up  Pharmacy Consult for Clozaril Monitoring  Allergies  Allergen Reactions  . Ciprofloxacin Shortness Of Breath and Itching  . Levofloxacin Other (See Comments)    Reaction:  Unknown   . Morphine And Related Nausea And Vomiting  . Sulfa Antibiotics Hives and Itching  . Symbicort [Budesonide-Formoterol Fumarate] Other (See Comments)    Reaction:  Psychotic episode  . Advair Diskus [Fluticasone-Salmeterol] Anxiety    CBC  Recent Labs  10/07/14 1748  WBC 9.4  HGB 14.5  HCT 43.8  PLT 268  CREATININE 0.86  MG 2.0  PHOS 3.4   ANC 6/28: 7.4  Estimated Creatinine Clearance: 40.7 mL/min (by C-G formula based on Cr of 0.86).  Medical History: Past Medical History  Diagnosis Date  . Anxiety   . COPD (chronic obstructive pulmonary disease)   . Anxiety   . 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 ALPRAZolam  0.25 mg Oral TID WC  . cholecalciferol  400 Units Oral Daily  . cloZAPine  12.5 mg Oral Daily  . cloZAPine  25 mg Oral QHS  . docusate sodium  200 mg Oral BID  . doxycycline  100 mg Oral Q12H  . feeding supplement (ENSURE ENLIVE)  237 mL Oral TID BM  . hydrocortisone cream   Topical TID  . megestrol  800 mg Oral QHS  . mirtazapine  45 mg Oral QHS  . multivitamin with minerals  1 tablet Oral Daily  . nicotine  21 mg Transdermal Daily  . risperiDONE  1 mg Oral TID WC  . risperiDONE  1 mg Oral QHS  . senna  2 tablet Oral Daily  . tiotropium  18 mcg Inhalation Daily    Assessment: Pharmacy consulted to monitor and report clozapine for REMS program in this 64 year old female. Patient initiating clozapine for delusional disorder (off label). Clozapine 12.'5mg'$  QHS started 6/28. Current orders for clozapine 12.5 mg daily and 25 mg QHS.  Plan:  ANC: 7.4, acceptable for dispensing. Patient is enrolled in clozapine rems program.   Will recheck labs 7/6   Rayna Sexton, PharmD, BCPS Clinical  Pharmacist 10/09/2014,7:55 AM

## 2014-10-09 NOTE — [facility] Group Notes (Signed)
[facility] Notes:  (Nursing/MHT/Case Management/Adjunct)  Date:  10/09/2014  Time:  2:25 AM  Type of Therapy:  Group Therapy  Participation Level:  Did Not Attend   Marylynn Pearson 10/09/2014, 2:25 AM

## 2014-10-09 NOTE — Progress Notes (Signed)
Pt received in bed, reports feelings of depression and hopelessness. Affect sad and depressed. States she will stop eating says she has nothing to live for. Clinical support provided. Encouraged to express her thoughts and feelings to staff. Snacks offered and accepted. Med compliant. 2L via N/C 02 maintained. Sitter at bedside. No AV/H noted. Will continue to monitor behavior.

## 2014-10-09 NOTE — Progress Notes (Signed)
Patient remains depressed and presents with flat affect.  Expresses passive SI.  Stays to self.  Tried to refuse medication.  States "There is no need there is nothing that can help me"  No interaction with peers. Have to encourage to eat.

## 2014-10-09 NOTE — Progress Notes (Signed)
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 236. Folate was 17.5.  TSH was checked in April at [facility] and he was normal  COPD: The patient is on Spiriva and duoneb. Continue 2 L oxygen at bedtime and when necessary shortness of breath.  Internal medicine was contacted 6/18  as patient continued to have shortness of breath.  Albuterol was changed to duoned.  Prednisone taper was started/completed on 6/26.  Internal medicine was concerned about nodule lesion found on CT completed at [facility]. They involved pulmonary.  Chest CT repeated.  Pulmonology feels nodule has been w/o change since 2011. Likely benign.  ID: Family concern with possible Lyme's disease. Infectious disease evaluated the patient on 6/20. They repeated Lyme serology  (results were neg)and started a trial of doxycycline for 10 days which she will complete today.  ID feels is very unlikely patient is actually suffering from Lyme's disease.  Tobacco use disorder: Continue nicotine patch  Constipation and hemorrhoids: continue Colace but will increase to 200 mg by mouth twice a day and Senokot to 2 tablets by mouth daily at bedtime.  Prune juice ordered with every meal.Continue Preparation H 3 times a day.    Disposition: Family is very concerned about the patient's ability to maintain her stability after discharge. They are unable to afford the cost of a assisted living facility. They are unable to have the patient live with them.  [place] are also [place] and there is no other family that can help.  As of now our discharge plan includes: Patient will moving with her friend and will continue to receive home health.  Once discharged a copy of her discharge summary will be faxed to her primary care provider and her home health agency:  Primary care :Dr.White-PCP on 08-20-14 at 10:50am [facility] [address] [place], [place] [address]  [phone]  Resources and Referrals  [place] @ [phone] Spoke with [facility] onsite liaison @ [phone] Referral accepted for [facility] for [facility] RN on 08/06/14 with PT, SW, and Dietitian to follow [facility] referral and documentation faxed to [facility] via [employer]. Fax # [phone]  Medical Decision Making:  Review of Psycho-Social Stressors (1), Review or order clinical lab tests (1), Order AIMS Test (2), Established Problem, Worsening (2) and Review of Medication Regimen & Side Effects (2)    Hildred Priest 10/09/2014, 12:33 PM

## 2014-10-09 NOTE — [facility] Group Notes (Signed)
[place] Group Notes:  (Nursing/MHT/Case Management/Adjunct)  Date:  10/09/2014  Time:  9:46 AM  Type of Therapy:  Psychoeducational Skills  Participation Level:  Did Not Attend   Summary of Progress/Problems:  Kathi Ludwig 10/09/2014, 9:46 AM

## 2014-10-09 NOTE — [facility] Group Notes (Signed)
[facility] Group Notes:  (Nursing/MHT/Case Management/Adjunct)  Date:  10/09/2014  Time:  2:02PM  Type of Therapy:  Group Therapy  Participation Level:  None  Participation Quality:  Resistant  Affect:  Resistant and Tearful  Cognitive:  Alert and Oriented  Insight:  Lacking  Engagement in Group:  None  Modes of Intervention:  Activity  Summary of Progress/Problems:  Melissa George Melissa George 10/09/2014, 5:19 PM

## 2014-10-09 NOTE — Plan of Care (Signed)
Problem: Alteration in mood Goal: LTG-Patient reports reduction in suicidal thoughts (Patient reports reduction in suicidal thoughts and is able to verbalize a safety plan for whenever patient is feeling suicidal)  Outcome: Not Progressing Passive SI.  Continually states "I am doomed.  I have nothing to live for" Goal: STG-Patient is able to discuss feelings and issues (Patient is able to discuss feelings and issues leading to depression)  Outcome: Not Progressing Unable to discuss feelings.  Fixated on delusion that she has no money or home.

## 2014-10-09 NOTE — [facility] Group Notes (Signed)
[facility] Group Notes:  (Nursing/MHT/Case Management/Adjunct)  Date:  10/09/2014  Time:  11:50 PM  Type of Therapy:  Evening Wrap-up Group  Participation Level:  Did Not Attend  Participation Quality:  N/A  Affect:  N/A  Cognitive:  N/A  Insight:  None  Engagement in Group:  None  Modes of Intervention:  Activity  Summary of Progress/Problems:  Levonne Spiller 10/09/2014, 11:50 PM

## 2014-10-10 MED ORDER — LORAZEPAM 2 MG/ML IJ SOLN
1.0000 mg | Freq: Once | INTRAMUSCULAR | Status: DC
  Filled 2014-10-10: qty 1

## 2014-10-10 MED ORDER — CLOZAPINE 25 MG PO TABS
75.0000 mg | ORAL_TABLET | Freq: Every day | ORAL | Status: DC
  Administered 2014-10-10 – 2014-10-13 (×4): 75 mg via ORAL
  Filled 2014-10-10 (×4): qty 3

## 2014-10-10 MED ORDER — RISPERIDONE 1 MG PO TABS
1.0000 mg | ORAL_TABLET | Freq: Every day | ORAL | Status: DC
  Administered 2014-10-11 – 2014-10-14 (×4): 1 mg via ORAL
  Filled 2014-10-10 (×4): qty 1

## 2014-10-10 MED ORDER — ENSURE ENLIVE PO LIQD: 237.0000 mL | Freq: Three times a day (TID) | ORAL | Status: DC

## 2014-10-10 MED ORDER — HALOPERIDOL LACTATE 5 MG/ML IJ SOLN: 2.0000 mg | Freq: Two times a day (BID) | INTRAMUSCULAR | Status: DC | PRN

## 2014-10-10 MED ORDER — SENNA 8.6 MG PO TABS
2.0000 | ORAL_TABLET | Freq: Every day | ORAL | Status: DC
  Administered 2014-10-11 – 2014-10-28 (×18): 17.2 mg via ORAL
  Filled 2014-10-10 (×19): qty 2

## 2014-10-10 MED ORDER — ENSURE ENLIVE PO LIQD
237.0000 mL | Freq: Four times a day (QID) | ORAL | Status: DC
  Administered 2014-10-10 – 2014-10-28 (×72): 237 mL via ORAL

## 2014-10-10 MED ORDER — ADULT MULTIVITAMIN W/MINERALS CH
1.0000 | ORAL_TABLET | Freq: Every day | ORAL | Status: DC
  Administered 2014-10-11 – 2014-10-28 (×18): 1 via ORAL
  Filled 2014-10-10 (×18): qty 1

## 2014-10-10 MED ORDER — RISPERIDONE 1 MG PO TABS
2.0000 mg | ORAL_TABLET | Freq: Every day | ORAL | Status: DC
  Administered 2014-10-10 – 2014-10-13 (×4): 2 mg via ORAL
  Filled 2014-10-10 (×4): qty 2

## 2014-10-10 MED ORDER — DIPHENHYDRAMINE HCL 50 MG/ML IJ SOLN: 12.5000 mg | Freq: Two times a day (BID) | INTRAMUSCULAR | Status: DC | PRN

## 2014-10-10 MED ORDER — CLOZAPINE 25 MG PO TABS
12.5000 mg | ORAL_TABLET | Freq: Two times a day (BID) | ORAL | Status: DC
  Administered 2014-10-10: 12.5 mg via ORAL
  Filled 2014-10-10: qty 1

## 2014-10-10 MED ORDER — CLOZAPINE 25 MG PO TABS
25.0000 mg | ORAL_TABLET | Freq: Every day | ORAL | Status: DC
  Administered 2014-10-11 – 2014-10-14 (×4): 25 mg via ORAL
  Filled 2014-10-10 (×4): qty 1

## 2014-10-10 MED ORDER — ALPRAZOLAM 0.5 MG PO TABS
0.5000 mg | ORAL_TABLET | Freq: Three times a day (TID) | ORAL | Status: DC
  Administered 2014-10-10 – 2014-10-14 (×11): 0.5 mg via ORAL
  Filled 2014-10-10 (×11): qty 1

## 2014-10-10 MED ORDER — CHOLECALCIFEROL 400 UNIT/ML PO LIQD
400.0000 [IU] | Freq: Every day | ORAL | Status: DC
  Administered 2014-10-11 – 2014-10-28 (×18): 400 [IU] via ORAL
  Filled 2014-10-10 (×22): qty 1

## 2014-10-10 NOTE — [facility] Group Notes (Signed)
[place] Group Notes:  (Nursing/MHT/Case Management/Adjunct)  Date:  10/10/2014  Time:  11:43 AM  Type of Therapy:  Psychoeducational Skills  Participation Level:  Did Not Attend   Summary of Progress/Problems:  Kathi Ludwig 10/10/2014, 11:43 AM

## 2014-10-10 NOTE — Progress Notes (Addendum)
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 follow [facility] referral and documentation faxed to [facility] via [facility]. Fax # [phone]  Medical Decision Making:  Review of Psycho-Social Stressors (1), Review or order clinical lab tests (1), Order AIMS Test (2), Established Problem, Worsening (2) and Review of Medication Regimen & Side Effects (2)    Hernandez-Gonzalez,  Ryo Klang 10/10/2014, 10:18 AM

## 2014-10-10 NOTE — BHH Group Notes (Signed)
[facility] LCSW Aftercare Discharge Planning Group Note   10/10/2014 3:41 PM  Participation Quality:  Did not attend.   Juno [place] MSW, LCSWA

## 2014-10-10 NOTE — Progress Notes (Signed)
Calm and cooperative. Slept well. Med compliant. No behavior problems noted. Room changed to 302A. No c/o pain/discomfort noted.

## 2014-10-10 NOTE — Tx Team (Signed)
Interdisciplinary Treatment Plan Update (Adult)  Date:  10/10/2014  Time Reviewed:  3:47 PM   Progress in Treatment: Attending groups: Yes. Participating in groups:  Yes. Taking medication as prescribed:  Yes. Tolerating medication:  Yes. Family/Significant othe contact made:  Yes, individual(s) contacted:  Ivor Reining (son)  Patient understands diagnosis:  No.Limited insight  Discussing patient identified problems/goals with staff: Yes Medical problems stabilized or resolved:  Yes. Denies suicidal/homicidal ideation: No, passive SI Issues/concerns per patient self-inventory:  Other:  New problem(s) identified: N/A  Discharge Plan or Barriers: Pt will eventually be discharged to outpatient medication management and living with her friend Melissa George.   Reason for Continuation of Hospitalization: Delusions  Anxiety  Depression   Comments: Since 6/29 she has been refusing meds. She eventually takes them but needs significant encouragement from nursing. Patient was told firmly that if she started refusing medications we were going to force the medications by injection. Patient has said she won't eat as there is no pint, since Monday her oral intake has decreased.Patient denies consistently having auditory or visual hallucinations. She is not seen interacting to internal stimuli. However strong delusional beliefs continued. There has not been much improvement with anti-psychotics. However over the last week I do feel that she has worsened, this worsening has happened after the patient was discontinued from Haldol and is started on Risperdal. Even though she did somewhat better on Haldol patient was still severely symptomatic on Haldol and delusional, the only difference is that she was more talkative and was attending some groups. However had to be discontinued as patient reported worsening of anxiety and shortness of breath after each dose of Haldol.Pt is on the waitlist for [place].     Estimated length of stay: 5-7 days   New goal(s): NA  Review of initial/current patient goals per problem list:    Attendees: Patient: Melissa George  6/23/20162:27 PM  Family:   6/23/20162:27 PM  Physician:  Merlyn Albert, MD 6/23/20162:27 PM  Nursing:     6/23/20162:27 PM  [place], [place] 6/23/20162:27 PM  Other:  Toma Copier, LCSW 6/23/20162:27 PM  Other:  6/23/20162:27 PM  Other:   6/23/20162:27 PM  Other:   6/23/20162:27 PM  Other:  6/23/20162:27 PM  Other:  6/23/20162:27 PM  Other:  6/23/20162:27 PM  Other:  6/23/20162:27 PM  Other:  6/23/20162:27 PM  Other:  6/23/20162:27 PM  Other:   6/23/20162:27 PM   Scribe for Treatment Team:   Wray Kearns, MSW, [facility] 10/10/2014 3:46 PM

## 2014-10-11 NOTE — Progress Notes (Signed)
Patient has been isolative to her room. No falls or injuries. Resistant to medications. No falls or injuries. 1:1 during the night while O2 use in order. 02 @ 2LPM via n/c. No acute distress.

## 2014-10-11 NOTE — Progress Notes (Signed)
Patient presents alert and oriented. Mood is very depressed and sad with flat affect. Patient not expressing any active suicidal thoughts but "wishes she was dead" and has been reluctant to eat or take medications. She asserts that she cannot swallow but this has not been observed. Paranoid delusional thinking still evident with patient fearful of financial ruin. Speech is soft, with poverty of content but organized and linear. Patient is eating with very strong encouragement and taking supplemental feedings. She did take all medications which were crushed per her request.  Will continue with all safety precaution, monitor mood and mental status, nutritional intake and hydration.

## 2014-10-11 NOTE — BHH Group Notes (Signed)
[facility] Group Notes:  (Nursing/MHT/Case Management/Adjunct)  Date:  10/11/2014  Time:  11:50 PM  Type of Therapy:  Group Therapy  Participation Level:  Did Not Attend   Marylynn Pearson 10/11/2014, 11:50 PM

## 2014-10-11 NOTE — BHH Group Notes (Signed)
[facility] LCSW Group Therapy  10/11/2014 2:46 PM  Type of Therapy:  Group Therapy  Participation Level:  Did Not Attend  Modes of Intervention:  Discussion, Education, Socialization and Support  Summary of Progress/Problems:Patients were encouraged to define what community means to them. They were encouraged to identify formal and informal support within their community.   [place] MSW, [facility]  10/11/2014, 2:46 PM
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
## 2014-10-11 NOTE — Progress Notes (Signed)
Pt isolates to her room. Med compliant. States she is not feeling better. Ambulating briefly about unit. O2 at bedtime with sitter.

## 2014-10-11 NOTE — BHH Group Notes (Signed)
[facility] Group Notes:  (Nursing/MHT/Case Management/Adjunct)  Date:  10/11/2014  Time:  2:00 AM  Type of Therapy:  Group Therapy  Participation Level:  Did Not Attend  Marylynn Pearson 10/11/2014, 2:00 AM

## 2014-10-12 NOTE — BHH Group Notes (Signed)
[facility] LCSW Group Therapy  10/12/2014 3:25 PM  Type of Therapy:  Group Therapy  Participation Level:  Did Not Attend  Modes of Intervention:  Discussion, Education, Role-play, Socialization and Support  Summary of Progress/Problems:Positive thoughts: Pt was encouraged to identify distorted thoughts. They will discuss the emotions and behaviors influenced by these thoughts. Pt will discuss ways to recognize distorted thoughts and how to refute them.   [place] MSW, [place]  10/12/2014, 3:25 PM

## 2014-10-12 NOTE — Progress Notes (Signed)
Patient has had flat affect. Reports that she "is not getting any better".  Took medication after much encouragement.  Patient has been drinking ensure drinks.  Will cont to monitor for safety.

## 2014-10-12 NOTE — BHH Group Notes (Signed)
[facility] Group Notes:  (Nursing/MHT/Case Management/Adjunct)  Date:  10/12/2014  Time:  11:46 PM  Type of Therapy:  Group Therapy  Participation Level:  Did Not Attend   Catina Nuss [name] Darcee Dekker 10/12/2014, 11:46 PM
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
## 2014-10-12 NOTE — Progress Notes (Signed)
Pt isolates to her room. Out for medication. Med compliant. Continues to say she isnt getting better. Sitter at bedside while pt using O2 at bedtime.

## 2014-10-13 ENCOUNTER — Encounter: Payer: Self-pay | Admitting: Infectious Diseases

## 2014-10-13 NOTE — [facility] Group Notes (Signed)
[facility] Group Notes:  (Nursing/MHT/Case Management/Adjunct)  Date:  10/13/2014  Time:  10:17 PM  Type of Therapy:  Psychoeducational Skills  Participation Level:  Did Not Attend  Summary of Progress/Problems:  Kathi Ludwig 10/13/2014, 10:17 PM

## 2014-10-13 NOTE — [facility] Group Notes (Signed)
[facility] LCSW Group Therapy  10/13/2014 1:53 PM  Type of Therapy:  Group Therapy  Participation Level:  Did Not Attend   [name] MSW, LCSWA 10/13/2014, 1:53 PM
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
## 2014-10-13 NOTE — Progress Notes (Signed)
Registered Nurse Signed Nursing Progress Notes 10/12/2014 4:13 PM    Expand All Collapse All   Patient has had a flat affect. She got up and took a shower without prompting.  Also went down to the dayroom for meals without prompting.  Patient has been drinking ensure drinks. She did state that she has been feeling "a little light headed".  VS were checked and Dr. Bary Leriche made aware. Pt's afternoon xanax was held for fall reasons.   Will cont to monitor for safety.

## 2014-10-14 MED ORDER — ALPRAZOLAM 0.25 MG PO TABS
0.2500 mg | ORAL_TABLET | Freq: Three times a day (TID) | ORAL | Status: DC
  Administered 2014-10-14 – 2014-10-26 (×37): 0.25 mg via ORAL
  Filled 2014-10-14 (×37): qty 1

## 2014-10-14 MED ORDER — CLOZAPINE 25 MG PO TABS
75.0000 mg | ORAL_TABLET | Freq: Every day | ORAL | Status: DC
Start: 2014-10-14 — End: 2014-10-15
  Administered 2014-10-14: 75 mg via ORAL
  Filled 2014-10-14: qty 3

## 2014-10-14 MED ORDER — CLOZAPINE 100 MG PO TABS: 100.0000 mg | ORAL_TABLET | Freq: Every day | ORAL | Status: DC

## 2014-10-14 MED ORDER — CLOZAPINE 25 MG PO TABS
12.5000 mg | ORAL_TABLET | Freq: Two times a day (BID) | ORAL | Status: DC
Start: 2014-10-14 — End: 2014-10-17
  Administered 2014-10-14 – 2014-10-16 (×5): 12.5 mg via ORAL
  Administered 2014-10-17: 25 mg via ORAL
  Filled 2014-10-14 (×6): qty 1

## 2014-10-14 NOTE — Progress Notes (Signed)
D: Pt is awake in her room this evening. Pt mood is depressed and her affect is sad/flat. Pt is denying SI/HI and AVH, but forwards little to staff. Pt is sitting on th edge of her bed staring at the floor.   A: Writer provided emotional support and encouraged pt participate more in the milieu. Writer also asked if she felt any stronger as a result of her weight gain.  R: Pt responds by shaking her head and does not wish to leave her room at this time. Pt indicates that she does not feel any better as a result of her nutrition plan. She did drink her entire ensure and is states she will be taking her medications as prescribed.

## 2014-10-14 NOTE — Plan of Care (Signed)
Problem: Alteration in mood Goal: LTG-Pt's behavior demonstrates decreased signs of depression (Patient's behavior demonstrates decreased signs of depression to the point the patient is safe to return home and continue treatment in an outpatient setting)  Outcome: Not Progressing Pt remains flat, depressed, isolative to rrom

## 2014-10-14 NOTE — Progress Notes (Signed)
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 [facility]. Fax # [phone]  Medical Decision Making:  Review of Psycho-Social Stressors (1), Review or order clinical lab tests (1), Order AIMS Test (2), Established Problem, Worsening (2) and Review of Medication Regimen & Side Effects (2)    Hildred Priest 10/14/2014, 11:48 AM

## 2014-10-14 NOTE — Progress Notes (Signed)
Patient states she is doomed and nothing in the world can fix her problems. She states she does not believe anyone trying to tell her differently, and no amount of "proof" will change her mind about her situation. She is unable to explain why she is resistant. She required multiple promptings to take her medications. She stated she drank her Ensure, but she did not. Documentation was changed to reflect accordingly. She denies SI, HI, and AVH. Affect is flat and depressed, she does not interact with peers.

## 2014-10-14 NOTE — Progress Notes (Signed)
NUTRITION FOLLOW-UP  INTERVENTION:  Meals/Snacks: encourage menu completion to best meet pt preferences Medical Food Supplement Therapy: continue Ensure Plus QID with meals as per order (Ensure Enlive: each supplement provides 350 kcal and 20 grams of protein)    NUTRITION DIAGNOSIS:   No PES statement at this time  GOAL:   Patient will meet greater than or equal to 90% of their needs  MONITOR:  Energy Intake, Anthropometrics  ASSESSMENT:  Diet Order:Regular  Current Nutrition: recorded po intake 52% of meals on average, pt eating at least 1-2 good meals (>75%) each day per documentation. Also taking Ensure supplements per documentation  Anthropometrics: Continue weight gain per documented weights since admission Filed Weights   10/13/14 0637 10/14/14 0500 10/14/14 0700  Weight: 90 lb (40.824 kg) 90 lb (40.824 kg) 90 lb (40.824 kg)    Labs: reviewed, no new labs  Meds: reviewed  Merrill, RD, LDN [phone] Pager

## 2014-10-14 NOTE — Plan of Care (Signed)
Problem: Ineffective individual coping Goal: STG: Patient will remain free from self harm Outcome: Progressing No self-harm.

## 2014-10-14 NOTE — [facility] Group Notes (Signed)
[facility] Group Notes:  (Nursing/MHT/Case Management/Adjunct)  Date:  10/14/2014  Time:  12:04 PM  Type of Therapy:  Psychoeducational Skills  Participation Level:  Did Not Attend  Pa Summary of Progress/Problems:  Drake Leach 10/14/2014, 12:04 PM

## 2014-10-14 NOTE — [facility] Group Notes (Signed)
[facility] Group Notes:  (Nursing/MHT/Case Management/Adjunct)  Date:  10/14/2014  Time:  9:37 PM  Type of Therapy:  Group Therapy  Participation Level:  Did Not Attend  Cauy Melody [name] Anajah Sterbenz 10/14/2014, 9:37 PM

## 2014-10-14 NOTE — Progress Notes (Signed)
She states she does not believe anyone trying to tell her differently, and no amount of "proof" will change her mind about her situation. She is unable to explain why she is resistant. Pt was med compliant today. She denies SI, HI, and AVH. Affect is flat and depressed, she does not interact with peers. Continues to stay isolative to room.

## 2014-10-15 LAB — CBC WITH DIFFERENTIAL/PLATELET
BASOS PCT: 1 %
Basophils Absolute: 0 10*3/uL (ref 0–0.1)
EOS PCT: 1 %
Eosinophils Absolute: 0.1 10*3/uL (ref 0–0.7)
HEMATOCRIT: 44.2 % (ref 35.0–47.0)
Hemoglobin: 14.9 g/dL (ref 12.0–16.0)
LYMPHS ABS: 1.7 10*3/uL (ref 1.0–3.6)
Lymphocytes Relative: 18 %
MCH: 32.1 pg (ref 26.0–34.0)
MCHC: 33.6 g/dL (ref 32.0–36.0)
MCV: 95.7 fL (ref 80.0–100.0)
MONO ABS: 0.8 10*3/uL (ref 0.2–0.9)
Monocytes Relative: 9 %
NEUTROS ABS: 6.7 10*3/uL — AB (ref 1.4–6.5)
Neutrophils Relative %: 71 %
Platelets: 189 10*3/uL (ref 150–440)
RBC: 4.62 MIL/uL (ref 3.80–5.20)
RDW: 13.5 % (ref 11.5–14.5)
WBC: 9.5 10*3/uL (ref 3.6–11.0)

## 2014-10-15 LAB — BASIC METABOLIC PANEL
Anion gap: 6 (ref 5–15)
BUN: 33 mg/dL — AB (ref 6–20)
CHLORIDE: 105 mmol/L (ref 101–111)
CO2: 27 mmol/L (ref 22–32)
CREATININE: 0.59 mg/dL (ref 0.44–1.00)
Calcium: 9.5 mg/dL (ref 8.9–10.3)
GFR calc non Af Amer: >60 mL/min (ref >60)
GLUCOSE: 116 mg/dL — AB (ref 65–99)
Potassium: 4.4 mmol/L (ref 3.5–5.1)
Sodium: 138 mmol/L (ref 135–145)

## 2014-10-15 MED ORDER — ADULT MULTIVITAMIN W/MINERALS CH: 1.0000 | ORAL_TABLET | Freq: Every day | ORAL | Status: DC

## 2014-10-15 MED ORDER — ENSURE ENLIVE PO LIQD: 237.0000 mL | Freq: Four times a day (QID) | ORAL | Status: DC

## 2014-10-15 MED ORDER — POLYETHYLENE GLYCOL 3350 17 G PO PACK: 17.0000 g | PACK | ORAL | Status: DC

## 2014-10-15 MED ORDER — CLOZAPINE 25 MG PO TABS: 12.5000 mg | ORAL_TABLET | Freq: Two times a day (BID) | ORAL | Status: DC

## 2014-10-15 MED ORDER — CLOZAPINE 100 MG PO TABS: 100.0000 mg | ORAL_TABLET | Freq: Every day | ORAL | Status: DC

## 2014-10-15 MED ORDER — CHOLECALCIFEROL 400 UNIT/ML PO LIQD: 400.0000 [IU] | Freq: Every day | ORAL | Status: DC

## 2014-10-15 MED ORDER — ALPRAZOLAM 0.25 MG PO TABS: 0.2500 mg | ORAL_TABLET | Freq: Three times a day (TID) | ORAL | Status: DC

## 2014-10-15 MED ORDER — DOCUSATE SODIUM 100 MG PO CAPS: 200.0000 mg | ORAL_CAPSULE | Freq: Two times a day (BID) | ORAL | Status: DC

## 2014-10-15 MED ORDER — CLOZAPINE 100 MG PO TABS
100.0000 mg | ORAL_TABLET | Freq: Every day | ORAL | Status: DC
  Administered 2014-10-15 – 2014-10-17 (×3): 100 mg via ORAL
  Filled 2014-10-15 (×3): qty 1

## 2014-10-15 MED ORDER — POLYETHYLENE GLYCOL 3350 17 G PO PACK
17.0000 g | PACK | ORAL | Status: DC
  Administered 2014-10-15 – 2014-10-22 (×5): 17 g via ORAL
  Filled 2014-10-15 (×5): qty 1

## 2014-10-15 NOTE — Progress Notes (Addendum)
D: Pt is awake in her room this evening, sitting on the edge of her bed. Pt eye contact is poor Probation officer must repeat questions at least twice to get a response. Pt denies SI/HI and AVH, but is still depressed and does not report any improvement in mood.   A: Writer encouraged pt to participate in unit activities and provided emotional support and insisted that she come to the medication room for administration rather than bring them to her.   R: Pt took her medications with apple sauce but they were not crushed. Pt ate her snack, ensure and most of the apple sauce before bed. Pt appears to be healthier now than on arrival. She is pleasant and cooperative with staff. O2 is ongoing at HS with a sitter present.

## 2014-10-15 NOTE — Plan of Care (Signed)
Problem: Alteration in mood Goal: STG-Pt Able to Identify Plan For Continuing Care at D/C Pt. Will be able to identify a plan for continuing care at discharge  Outcome: Not Progressing Pt continues to endorse hopelessness and helplessness

## 2014-10-15 NOTE — Discharge Summary (Addendum)
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 Latest Ref Range: CLEAR      CLOUDY (A)  Bacteria, UA Latest Ref Range: NONE SEEN      NONE SEEN  Bilirubin Urine Latest Ref Range: NEGATIVE      NEGATIVE  Color, Urine Latest Ref Range: YELLOW      YELLOW (A)  Glucose Latest Ref Range: NEGATIVE mg/dL     NEGATIVE  Hgb urine dipstick Latest Ref Range: NEGATIVE      NEGATIVE  Ketones, ur Latest Ref Range: NEGATIVE mg/dL     NEGATIVE  Leukocytes, UA Latest Ref Range: NEGATIVE      NEGATIVE  Mucous Unknown     PRESENT  Nitrite Latest Ref Range: NEGATIVE      NEGATIVE  Oval Fat Body Unknown     PRESENT  pH Latest Ref Range: 5.0-8.0      8.0  Protein Latest Ref Range: NEGATIVE mg/dL     NEGATIVE  RBC / HPF Latest Ref Range: 0-5 RBC/hpf     0-5  Specific Gravity, Urine Latest Ref Range: 1.005-1.030      1.020  Squamous Epithelial / LPF Latest Ref Range: NONE SEEN      NONE SEEN  WBC, UA Latest Ref Range: 0-5 WBC/hpf     NONE SEEN   Results for CALLAHAN, PEDDIE (MRN [id number]) as of  10/24/2014 15:30  Ref. Range 09/14/2014 06:28 09/28/2014 18:29  Cholesterol Latest Ref Range: 0-200 mg/dL 183   Triglycerides Latest Ref Range: <150 mg/dL 236 (H)   HDL Cholesterol Latest Ref Range: >40 mg/dL 53   LDL (calc) Latest Ref Range: 0-99 mg/dL 83   VLDL Latest Ref Range: 0-40 mg/dL 47 (H)   Total CHOL/HDL Ratio Latest Units: RATIO 3.5   Ferritin Latest Ref Range: 11-307 ng/mL  23  Folate Latest Ref Range: >5.9 ng/mL 17.5   Vitamin B-12 Latest Ref Range: 180-914 pg/mL 878                                                Hemoglobin A1C Latest Ref Range: 4.0-6.0 % 5.1   Glucose Latest Ref Range: 65-99 mg/dL  121 (H)  TSH Latest Ref Range: 0.350-4.500 uIU/mL  3.074  B burgdorferi Ab IgG+IgM Latest Ref Range: 0.00-0.90 ISR  <0.91   CT CHEST WITHOUT CONTRAST (10/02/2014)  TECHNIQUE: Multidetector CT imaging of the chest was performed following the standard protocol without IV contrast.  COMPARISON: Chest radiographs dated 09/28/2014, 01/21/2014, and 06/22/2009. CT chest dated 01/22/2014.  FINDINGS: Mediastinum/Nodes: The heart is normal in size. No pericardial effusion.  No mediastinal lymphadenopathy.  Visualized thyroid is unremarkable  Lungs/Pleura: 12 x 7 mm calcified nodule in the lateral right upper lobe (series 3/image 15), unchanged since 2011, likely reflecting benign nodular scarring related to prior granulomatous disease.  Moderate to severe centrilobular and paraseptal emphysematous changes.  No focal consolidation.  No pleural effusion or pneumothorax.  Upper abdomen: Visualized upper abdomen is grossly unremarkable.  Musculoskeletal: Mild degenerative changes of the  visualized thoracolumbar spine.  IMPRESSION: 12 x 7 mm calcified nodule in the lateral right upper lobe, unchanged since 2011, benign.  No evidence of acute cardiopulmonary disease.  Underlying moderate to severe emphysematous changes.   Physical  Findings: AIMS: Facial and Oral Movements Muscles of Facial Expression: None, normal Lips and Perioral Area: None, normal Jaw: None, normal Tongue: None, normal,Extremity Movements Upper (arms, wrists, hands, fingers): None, normal Lower (legs, knees, ankles, toes): None, normal, Trunk Movements Neck, shoulders, hips: None, normal, Overall Severity Severity of abnormal movements (highest score from questions above): None, normal Incapacitation due to abnormal movements: None, normal Patient's awareness of abnormal movements (rate only patient's report): No Awareness, Dental Status Current problems with teeth and/or dentures?: No Does patient usually wear dentures?: No       [facility]  Is patient on multiple antipsychotic therapies at discharge:  No   Has Patient had three or more failed trials of antipsychotic monotherapy by history:  No    Recommended Plan for Multiple Antipsychotic Therapies: NA     Medication List    STOP taking these medications        acetaminophen 325 MG tablet  Commonly known as:  TYLENOL     albuterol 108 (90 BASE) MCG/ACT inhaler  Commonly known as:  PROVENTIL HFA;VENTOLIN HFA     buPROPion 150 MG 24 hr tablet  Commonly known as:  WELLBUTRIN XL     LORazepam 0.5 MG tablet  Commonly known as:  ATIVAN     QUEtiapine 25 MG tablet  Commonly known as:  SEROQUEL      TAKE these medications      Indication   ALPRAZolam 0.25 MG tablet  Commonly known as:  XANAX  Take 1 tablet (0.25 mg total) by mouth 3 (three) times daily with meals.      cholecalciferol 400 UNIT/ML Liqd  Commonly known as:  D-VI-SOL  Take 1 mL (400 Units total) by mouth daily with breakfast.      cloZAPine 25 MG tablet  Commonly known as:  CLOZARIL  Take 1 tablet (25 mg total) by mouth at bedtime.      docusate sodium 100 MG capsule  Commonly known as:  COLACE  Take 2 capsules (200 mg total) by mouth 2 (two) times daily.      feeding  supplement (ENSURE ENLIVE) Liqd  Take 237 mLs by mouth 4 (four) times daily.      fluvoxaMINE 50 MG tablet  Commonly known as:  LUVOX  Take 1 tablet (50 mg total) by mouth at bedtime.      ipratropium-albuterol 0.5-2.5 (3) MG/3ML Soln  Commonly known as:  DUONEB  Take 3 mLs by nebulization every 6 (six) hours as needed.      magnesium citrate Soln  Take 296 mLs (1 Bottle total) by mouth 2 (two) times a week.      megestrol 400 MG/10ML suspension  Commonly known as:  MEGACE  Take 20 mLs (800 mg total) by mouth at bedtime.  Notes to Patient:  Appetite stimulation      mirtazapine 45 MG tablet  Commonly known as:  REMERON  Take 1 tablet (45 mg total) by mouth at bedtime.  Notes to Patient:  Insomnia, appetite and depression      multivitamin with minerals Tabs tablet  Take 1 tablet by mouth daily with breakfast.      senna 8.6 MG Tabs tablet  Commonly known as:  SENOKOT  Take 2 tablets (  17.2 mg total) by mouth daily.  Notes to Patient:  Constipation      tiotropium 18 MCG inhalation capsule  Commonly known as:  SPIRIVA  Place 18 mcg into inhaler and inhale daily.  Notes to Patient:  COPD          Total Discharge Time: >30 minutes  Signed: Hildred Priest 10/24/2014, 4:19 PM

## 2014-10-15 NOTE — Progress Notes (Signed)
   10/15/14 1500  Clinical Encounter Type  Visited With Patient  Visit Type Spiritual support;Follow-up  Referral From Physician  Consult/Referral To Chaplain  Spiritual Encounters  Spiritual Needs Prayer;Emotional  Stress Factors  Patient Stress Factors Health changes;Exhausted;Family relationships;Financial concerns  Family Stress Factors Health changes  Advance Directives (For Healthcare)  Does patient have an advance directive? No  Would patient like information on creating an advanced directive? No - patient declined information   Chaplain provided prayer and empathic listening.  AD [phone]

## 2014-10-15 NOTE — Progress Notes (Signed)
Pt continues to be hopeless, helpless. Continues to voice concerns of money and burial for sons. Isolative to room, no interaction with peers, minimal with staff. Med compliant. Does not attend group. Chaplain in for counseling. Encouraged pt to increase intake. Will  Continue to assess and monitor for safety.

## 2014-10-15 NOTE — Progress Notes (Signed)
MEDICATION RELATED CONSULT NOTE - Follow up  Pharmacy Consult for Clozaril Monitoring  Allergies  Allergen Reactions  . Ciprofloxacin Shortness Of Breath and Itching  . Levofloxacin Other (See Comments)    Reaction:  Unknown   . Morphine And Related Nausea And Vomiting  . Sulfa Antibiotics Hives and Itching  . Symbicort [Budesonide-Formoterol Fumarate] Other (See Comments)    Reaction:  Psychotic episode  . Advair Diskus [Fluticasone-Salmeterol] Anxiety    CBC  Recent Labs  10/15/14 0621 10/15/14 0622  WBC 9.5  --   HGB 14.9  --   HCT 44.2  --   PLT 189  --   CREATININE  --  0.59   ANC 6/28: 7.4 ANC 7/6: 6.7  Estimated Creatinine Clearance: 44.7 mL/min (by C-G formula based on Cr of 0.59).  Medical History: Past Medical History  Diagnosis Date  . Anxiety   . COPD (chronic obstructive pulmonary disease)   . Anxiety   . 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 ALPRAZolam  0.25 mg Oral TID WC  . cholecalciferol  400 Units Oral Q breakfast  . cloZAPine  100 mg Oral QHS  . cloZAPine  12.5 mg Oral BID WC  . docusate sodium  200 mg Oral BID  . feeding supplement (ENSURE ENLIVE)  237 mL Oral QID  . megestrol  800 mg Oral QHS  . mirtazapine  45 mg Oral QHS  . multivitamin with minerals  1 tablet Oral Q breakfast  . nicotine  21 mg Transdermal Daily  . senna  2 tablet Oral QHS  . tiotropium  18 mcg Inhalation Daily    Assessment: Pharmacy consulted to monitor and report clozapine for REMS program in this 64 year old female. Patient initiating clozapine for delusional disorder (off label). Clozapine 12.'5mg'$  QHS started 6/28. Current orders for clozapine 12.5 mg BID (breakfast and lunch) and 100 mg QHS.  Plan:  ANC: 7.4, acceptable for dispensing. Patient is enrolled in clozapine rems program.  ANC: 6.7, acceptable for dispensing  Will recheck labs 7/13   Rayna Sexton, PharmD, BCPS Clinical Pharmacist 10/15/2014,8:23 AM

## 2014-10-15 NOTE — [facility] Group Notes (Signed)
[facility] LCSW Aftercare Discharge Planning Group Note  10/15/2014 10:08 AM  Participation Quality:  Did not attend group.   [name], MSW, LCSWA 10/15/2014, 10:08 AM

## 2014-10-15 NOTE — [facility] Group Notes (Signed)
[facility] Group Notes:  (Nursing/MHT/Case Management/Adjunct)  Date:  10/15/2014  Time:  12:06 PM  Type of Therapy:  Group Therapy  Participation Level:  Did Not Attend  Participation Quality:  Summary of Progress/Problems:  Melissa George 10/15/2014, 12:06 PM

## 2014-10-15 NOTE — Tx Team (Signed)
Interdisciplinary Treatment Plan Update (Adult)  Date:  10/15/2014 Time Reviewed:  10:19 AM  Progress in Treatment: Attending groups: No. Participating in groups:  No. Taking medication as prescribed:  No. Tolerating medication:  Yes. Family/Significant othe contact made:  Yes, individual(s) contacted:  Sons, Darleene Cleaver Patient understands diagnosis:  No.Pt denial of symptoms Discussing patient identified problems/goals with staff:  Yes. Medical problems stabilized or resolved:  Yes. Denies suicidal/homicidal ideation: Yes. Issues/concerns per patient self-inventory:  Yes. and No. Other:  New problem(s) identified: No, Describe:     Discharge Plan or Barriers:Since 6/29 she has been refusing meds. She eventually takes them but needs significant encouragement from nursing. Patient was told firmly that if she started refusing medications we were going to force the medications by injection. Patient has said she won't eat as there is no pint.  Last week oral intake decreased it is now back up to 40-80%.  Patient denies consistently having auditory or visual hallucinations. She is not seen interacting to internal stimuli. However strong delusional beliefs continue. There has not much improvement with anti-psychotics.  Pt is on the waitlist for [facility].   Reason for Continuation of Hospitalization: Anxiety Delusions  Medication stabilization  Comments:  Estimated length of stay: up to 7 days  New goal(s):  Review of initial/current patient goals per problem list:   SEE PLAN OF CARE  Attendees: Patient:  Melissa George 7/6/201610:19 AM  Family:   7/6/201610:19 AM  Physician:  Jerilee Hoh 7/6/201610:19 AM  Nursing:    7/6/201610:19 AM  Case Manager:   7/6/201610:19 AM  Counselor:  Dossie Arbour, LCSW 7/6/201610:19 AM  Other:  Carmell Austria, LCSWA 7/6/201610:19 AM  Other:   7/6/201610:19 AM  Other:   7/6/201610:19 AM  Other:  7/6/201610:19 AM  Other:  7/6/201610:19 AM  Other:   7/6/201610:19 AM  Other:  7/6/201610:19 AM  Other:  7/6/201610:19 AM  Other:  7/6/201610:19 AM  Other:   7/6/201610:19 AM   Scribe for Treatment Team:   Dossie Arbour P,MSW, LCSW 10/15/2014, 10:19 AM

## 2014-10-15 NOTE — Plan of Care (Signed)
Problem: Ineffective individual coping Goal: STG: Patient will remain free from self harm Outcome: Progressing Pt has refrained from self harm and denies SI.  Problem: Alteration in mood Goal: LTG-Pt's behavior demonstrates decreased signs of depression (Patient's behavior demonstrates decreased signs of depression to the point the patient is safe to return home and continue treatment in an outpatient setting)  Outcome: Not Progressing Pt denies improvement in mood and appears despondent.

## 2014-10-15 NOTE — Progress Notes (Signed)
Adult Services Patient-Family Contact/Session  Attendees:  Dr. Jerilee Hoh, [place] student, Algis Downs, Dossie Arbour, LCSW  Goal(s):  Update on Patient progress/Barriers to progress.   Safety Concerns:  Safe discharge planning  Narrative:  Discussed option for possible bed offer at [facility]. Sons talked with one another and decided that they did not want their mom to go to [facility] at this time.  SW and Dr. Jerilee Hoh reviewed with them that once Clozaril was titrated to therapeutic level would have to be discharged regardless.  They verbalize understanding of this and that this opportunity will not likely present itself again.  SW agreed to call financial services and ask them to be in communication with Pt's family to initiate disability process.  Barriers: Continuing delusions   Interventions:  Titrating clozaril to therapeutic level.   Recommendation(s):  Team reccomends [facility] for ongoing long-term treatment.  Family refused, Continue to recommend that they obtain POA and begin working towards a long-term plan with the patient.  Follow-up Required:  Yes, will phone conference again Monday 10/20/14 at 2 pm, SW will refer to financial services.  Explanation:    August Saucer, MSW, LCSW 10/15/2014, 2:56 PM

## 2014-10-15 NOTE — [facility] Group Notes (Signed)
[facility] LCSW Group Therapy  10/15/2014 3:24 PM  Type of Therapy:  Psychotherapeutic Group  Participation Level:  Did Not Attend  Participation Quality:     Affect:     Cognitive:     Insight:     Engagement in Therapy:     Modes of Intervention:     Summary of Progress/Problems:  Dossie Arbour PMSW, LCSW 10/15/2014, 3:24 PM

## 2014-10-15 NOTE — Progress Notes (Signed)
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 dizziness.  Insomnia: Not longer having insomnia since is started on the Clozaril.  Vitamin D deficiency : Continue Vit D   Metabolic syndrome monitoring : HgA1c was 5.1 and Total cholesterol was 186.   Weight loss/Anorexia: BMI of 14.6 at admission/weight 34.9 kg/76.9lbs.  Today  weight is 40KG/89lbs. Continue mVT with minerals. Continue Ensure but will increase to qid. Melissa George has been evaluated by the dietitian.  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  Continue  megace 800 mg q day.   Baseline blood: Vitamin  B12 was in the 700 when hospitalized at [facility] in April Hemoglobin A1c is 5.1. Lipid panel shows triglycerides of 236. Folate was 17.5.  TSH was checked in April at [facility] and he was normal  COPD: The Melissa George is on Spiriva and duoneb. Continue 2 L oxygen at bedtime and when necessary shortness of breath.  Internal medicine was contacted 6/18  as Melissa George continued to have shortness of breath.  Albuterol was changed to duoned.  Prednisone taper was started/completed on 6/26.  Internal medicine was concerned about nodule lesion found on CT completed at [facility]. They involved pulmonary.  Chest CT repeated.  Pulmonology feels nodule has been w/o change since 2011. Likely benign.  ID: Family concern with possible Lyme's disease. Infectious disease evaluated the Melissa George on 6/20. They repeated Lyme serology  (results were neg)and started a trial of doxycycline for 10 days which Melissa George will complete on 6/29.  ID feels is very unlikely Melissa George is actually suffering from Lyme's disease.  Tobacco use disorder: Continue nicotine patch  Constipation and hemorrhoids: continue Colace 200 mg by mouth twice a day and Senokot to 2 tablets by mouth daily at bedtime. Prune juice ordered with every meal.Continue Preparation H 3 times a day prn.  Today we will order MiraLAX to be given every other day for constipation.  Vitals: Continue with vital signs 3 times a day. Melissa George has been tachycardic. Will observe today as Risperdal will be tapered off. Likely this tachycardia is induced by medications.  Disposition: Family is very concerned about the Melissa George's ability to maintain her stability after discharge. They are unable to afford the cost of a assisted living facility. They are unable to have the Melissa George live with them.  [place] are also out of the [place] and there is no other family that can help.  As of now our discharge plan includes: Melissa George will moving with her  friend and will continue to receive home health.  Once discharged a copy of her discharge summary will be faxed to her primary care provider and her home health agency:  Primary care :Dr.White-PCP on 08-20-14 at 10:50am [facility]. [place], [place] [address]  [phone]  Resources and Referrals  [employer] @ [phone] Spoke with [place] onsite liaison @ [phone] Referral accepted for [facility] for [facility] RN on 08/06/14 with Melissa George, SW, and Dietitian to follow [facility] referral and documentation faxed to [facility] via [employer]. Fax # [phone]  Medical Decision Making:  Review of Psycho-Social Stressors (1), Review or order clinical lab tests (1), Order AIMS Test (2), Established Problem, Worsening (2) and Review of Medication Regimen & Side Effects (2)    Hildred Priest 10/15/2014, 12:54 PM

## 2014-10-16 LAB — CLOZAPINE (CLOZARIL)
Clozapine Lvl: 67 ng/mL — ABNORMAL LOW (ref 350–650)
NorClozapine: 34 ng/mL
Total(Cloz+Norcloz): 101 ng/mL

## 2014-10-16 NOTE — Progress Notes (Signed)
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 3 times a day. Patient has been tachycardic. Likely this tachycardia is induced by medications.  Disposition: Family is very concerned about the patient's ability to maintain her stability after discharge. They are unable to afford the cost of a assisted living facility. They are unable to have the patient live with them.  [place] are also out of the [place] and there is no other family that can help.  As of now our discharge plan includes: Patient will moving with her friend and will continue to receive home health.  Once discharged a copy of her discharge summary will be faxed to her primary care provider and her home health agency:  Primary care :Dr.White-PCP on 08-20-14 at 10:50am [facility]. [place], [place] [address]  [phone]  Resources and Referrals  [facility] @ [phone] Spoke with [facility] onsite liaison @ [phone] Referral accepted for [facility] for [facility] RN on 08/06/14 with PT, SW, and Dietitian to follow [facility] referral and documentation faxed to [facility] via [employer] [facility].  Fax # [phone]  Medical Decision Making:  Review of Psycho-Social Stressors (1), Review or order clinical lab tests (1), Order AIMS Test (2), Established Problem, Worsening (2) and Review of Medication Regimen & Side Effects (2)    Hernandez-Gonzalez,  Tabbitha Janvrin 10/16/2014, 10:10 AM

## 2014-10-16 NOTE — Plan of Care (Signed)
Problem: Ineffective individual coping Goal: STG: Patient will remain free from self harm Outcome: Progressing Patient denies SI/HI, 15 minutes checks maintained.

## 2014-10-16 NOTE — [facility] Group Notes (Signed)
[facility] Group Notes:  (Nursing/MHT/Case Management/Adjunct)  Date:  10/16/2014  Time:  8:49 AM  Type of Therapy:  Goals   Participation Level:  Did Not Attend   Melissa George 10/16/2014, 8:49 AM

## 2014-10-16 NOTE — Progress Notes (Signed)
D: Pt denies SI/HI/AVH. Patient appears anxious, apprehensive and worried. Patient is not  interacting with peers, minimal communication with staff members.  A: Pt was offered support and encouragement. Pt was given scheduled medications. Pt was encouraged to attend groups. Q 15 minute checks were done for safety.  R: Patient did not attend groups this evening.  Pt is compliant with medication. Pt receptive to treatment and safety maintained on unit.

## 2014-10-16 NOTE — [facility] Group Notes (Signed)
[facility] LCSW Group Therapy  10/16/2014 3:58 PM  Type of Therapy:  Group Therapy  Participation Level:  Did Not Attend   [name], MSW, LCSWA 10/16/2014, 3:58 PM

## 2014-10-16 NOTE — [facility] Group Notes (Signed)
[facility] Group Notes:  (Nursing/MHT/Case Management/Adjunct)  Date:  10/16/2014  Time:  9:19 PM  Type of Therapy:  Group Therapy  Participation Level:  Did Not Attend  Marylynn Pearson 10/16/2014, 9:19 PM

## 2014-10-16 NOTE — [facility] Group Notes (Signed)
[facility] Group Notes:  (Nursing/MHT/Case Management/Adjunct)  Date:  10/16/2014  Time:  1:39 PM  Type of Therapy:  Group Therapy  Participation Level:  Did Not Attend  Summary of Progress/Problems:  Melissa George 10/16/2014, 1:39 PM

## 2014-10-16 NOTE — Progress Notes (Signed)
Patient has been isolative to her room today and needs much prompting to leave her room for meds and meals. Affect is flat. Mood anxious. Drinking Ensure between meals. She indicated she was having passive SI today but agreed to let staff know if she feels she may hurt herself. Denies HI or AVH. Continue to monitor.

## 2014-10-16 NOTE — Plan of Care (Signed)
Problem: Ineffective individual coping Goal: STG:Pt. will utilize relaxation techniques to reduce stress STG: Patient will utilize relaxation techniques to reduce stress levels  Outcome: Not Progressing Patient has been given instruction on deep breathing techniques but she will not practice them.

## 2014-10-17 MED ORDER — CLOZAPINE 25 MG PO TABS
12.5000 mg | ORAL_TABLET | Freq: Three times a day (TID) | ORAL | Status: DC
Start: 2014-10-17 — End: 2014-10-21
  Administered 2014-10-17 – 2014-10-21 (×13): 12.5 mg via ORAL
  Filled 2014-10-17 (×13): qty 1

## 2014-10-17 NOTE — Progress Notes (Signed)
Patient has been alert and oriented today. She has a blunted sad affect.  Patient is medication compliant.  Remained in room most of the day but did get up for meals. EKG performed.   Will cont to monitor for safety.

## 2014-10-17 NOTE — [facility] Group Notes (Signed)
[facility] Group Notes:  (Nursing/MHT/Case Management/Adjunct)  Date:  10/17/2014  Time:  11:27 AM  Type of Therapy:  Movement Therapy  Participation Level:  Did Not Attend  Summary of Progress/Problems:  Melissa George 10/17/2014, 11:27 AM
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
## 2014-10-17 NOTE — [facility] Group Notes (Signed)
[place] LCSW Group Therapy  10/17/2014 2:59 PM  Type of Therapy:  Group Therapy  Participation Level:  Did Not Attend   Modes of Intervention:  Discussion, Education, Socialization and Support  Summary of Progress/Problems:Feelings around Relapse. Group members discussed the meaning of relapse and shared personal stories of relapse, how it affected them and others, and how they perceived themselves during this time. Group members were encouraged to identify triggers, warning signs and coping skills used when facing the possibility of relapse. Social supports were discussed and explored in detail.  [place] MSW, [place]  10/17/2014, 2:59 PM

## 2014-10-18 MED ORDER — CLOZAPINE 25 MG PO TABS
112.5000 mg | ORAL_TABLET | Freq: Every day | ORAL | Status: DC
  Administered 2014-10-18 – 2014-10-19 (×2): 125 mg via ORAL
  Filled 2014-10-18 (×2): qty 1

## 2014-10-18 NOTE — Progress Notes (Signed)
D:   SI- contracts for safety. Pt denies HI/AVH. Pt is pleasant and cooperative. Pt complained that her anxiety was getting worse. Pt appears to be very somatic complaining of various things from anxiety to SOB.   A: Pt was offered support and encouragement. Pt was given scheduled medications. Pt was encourage to attend groups. Q 15 minute checks were done for safety.   R:Pt attends groups and interacts well with peers and staff. Pt is taking medication. Pt receptive to treatment and safety maintained on unit.

## 2014-10-18 NOTE — Progress Notes (Signed)
Patient has been alert and oriented today. She has a blunted sad affect. Patient is medication compliant. Remained in room most of the day but did get up for meals. . Will cont to monitor for safety.

## 2014-10-18 NOTE — [facility] Group Notes (Signed)
[place] Group Notes:  (Nursing/MHT/Case Management/Adjunct)  Date:  10/18/2014  Time:  10:20 AM  Type of Therapy:  Goals Setting  Participation Level:  Did Not Attend  Cel[place] Amy 10/18/2014, 10:20 AM

## 2014-10-18 NOTE — [facility] Group Notes (Signed)
[place] LCSW Group Therapy  10/18/2014 4:46 PM  Type of Therapy:  Group Therapy  Participation Level:  Did Not Attend  Modes of Intervention:  Discussion, Education, Socialization and Support  Summary of Progress/Problems:Pt will identify unhealthy thoughts and how they impact their emotions and behavior. Pt will be encouraged to discuss these thoughts, emotions and behaviors with the group.  Wray Kearns ,MSW, LCSWA  10/18/2014, 4:46 PM

## 2014-10-18 NOTE — Progress Notes (Signed)
Pt with sitter at bedside for O2 use at night.
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
## 2014-10-18 NOTE — Progress Notes (Signed)
MEDICATION RELATED CONSULT NOTE - INITIAL   Pharmacy Consult for Clozapine Monitoring   Allergies  Allergen Reactions  . Ciprofloxacin Shortness Of Breath and Itching  . Levofloxacin Other (See Comments)    Reaction:  Unknown   . Morphine And Related Nausea And Vomiting  . Sulfa Antibiotics Hives and Itching  . Symbicort [Budesonide-Formoterol Fumarate] Other (See Comments)    Reaction:  Psychotic episode  . Advair Diskus [Fluticasone-Salmeterol] Anxiety    Patient Measurements: Height: '5\' 1"'$  (154.9 cm) Weight: 92 lb (41.731 kg) IBW/kg (Calculated) : 47.8  Vital Signs: Temp: 98.5 F (36.9 C) (07/09 0703) Temp Source: Oral (07/09 0703) BP: 119/77 mmHg (07/09 0703) Pulse Rate: 106 (07/09 0703) Intake/Output from previous day: 07/08 0701 - 07/09 0700 In: 480 [P.O.:480] Out: -  Intake/Output from this shift: Total I/O In: 240 [P.O.:240] Out: -   Labs: No results for input(s): WBC, HGB, HCT, PLT, APTT, CREATININE, LABCREA, CREATININE, CREAT24HRUR, MG, PHOS, ALBUMIN, PROT, ALBUMIN, AST, ALT, ALKPHOS, BILITOT, BILIDIR, IBILI in the last 72 hours. Estimated Creatinine Clearance: 46.2 mL/min (by C-G formula based on Cr of 0.59).   Microbiology: No results found for this or any previous visit (from the past 720 hour(s)).  Medical History: Past Medical History  Diagnosis Date  . Anxiety   . COPD (chronic obstructive pulmonary disease)   . Anxiety   . 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 ALPRAZolam  0.25 mg Oral TID WC  . cholecalciferol  400 Units Oral Q breakfast  . cloZAPine  112.5 mg Oral QHS  . cloZAPine  12.5 mg Oral TID WC  . docusate sodium  200 mg Oral BID  . feeding supplement (ENSURE ENLIVE)  237 mL Oral QID  . megestrol  800 mg Oral QHS  . mirtazapine  45 mg Oral QHS  . multivitamin with minerals  1 tablet Oral Q breakfast  . nicotine  21 mg Transdermal Daily  . polyethylene glycol  17 g Oral Q M,W,F,Su-1800  . senna  2 tablet  Oral QHS  . tiotropium  18 mcg Inhalation Daily    Assessment: Patient ordered Clozapine 112.5 mg qhs and 12.5 mg TID.    ANC:  6.7 on 7/6  Plan:  Monitor ANC on weekly basis.  F/U labs ordered for 7/13.  Edythe Riches G 10/18/2014,12:15 PM

## 2014-10-18 NOTE — Plan of Care (Signed)
Problem: Ineffective individual coping Goal: STG: Patient will remain free from self harm Outcome: Progressing Pt safe on the unit

## 2014-10-19 NOTE — Progress Notes (Signed)
Pt isolates to her room. Says she is no better. Did not attend group. Med compliant. Sitter at bedside for O2 at night.

## 2014-10-19 NOTE — Progress Notes (Signed)
D-Patient remains isolative, tense and nervous. Needs several prompts to go to meals. Not eating much, but she is drinking Ensure supplement. States she is going to lose everything she has, and no one can help her. Endorses feeling hopeless and having SI but agrees to let staff know before she would harm herself.  A-Offered emotional support. Encouraged participation in milieu. Instruction offered on relaxation techniques. Given meds.   R-Patient too anxious to follow instructions on stress reduction techniques. Unable to see any alternative outcomes to delusion that she has broken the law, and that she will end up homeless, jobless.
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
## 2014-10-19 NOTE — Plan of Care (Signed)
Problem: Alteration in mood Goal: STG-Patient is able to discuss feelings and issues (Patient is able to discuss feelings and issues leading to depression)  Outcome: Not Progressing Patient is superficial and guarded. She rarely speaks, and when she does it's in a whisper. She has resigned herself to believing there is no hope.

## 2014-10-19 NOTE — [facility] Group Notes (Signed)
[facility] LCSW Group Therapy  10/19/2014 8:39 PM  Type of Therapy:  Group Therapy  Participation Level:  Did Not Attend  Modes of Intervention:  Discussion, Education, Problem-solving, Socialization and Support  Summary of Progress/Problems:Overcoming obstacles: Pt was challenged to identify obstacles faced currently and processed barriers involved in overcoming these obstacles. Group members were asked to process the steps necessary for overcoming obstacles and explored motivation (external and internal) for facing these difficulties head on. Pt was asked to identify at least one area of concern and choose a skill of focus pulled from their "tool box."   [place] MSW, [place]  10/19/2014, 8:39 PM

## 2014-10-20 MED ORDER — FLUVOXAMINE MALEATE 50 MG PO TABS
25.0000 mg | ORAL_TABLET | Freq: Every day | ORAL | Status: DC
  Administered 2014-10-20 – 2014-10-22 (×3): 25 mg via ORAL
  Filled 2014-10-20 (×3): qty 1

## 2014-10-20 MED ORDER — CLOZAPINE 100 MG PO TABS
100.0000 mg | ORAL_TABLET | Freq: Every day | ORAL | Status: DC
  Administered 2014-10-20: 100 mg via ORAL
  Filled 2014-10-20: qty 1

## 2014-10-20 MED ORDER — CLOZAPINE 25 MG PO TABS: 125.0000 mg | ORAL_TABLET | Freq: Every day | ORAL | Status: DC

## 2014-10-20 NOTE — Progress Notes (Signed)
D--Patient very lethargic and will not even sit up on her bed to take meds without much prompting. She reports passive SI but contracts for safety on the unit. Denies HI or AVH. Remains with fixed delusions.  A--Reality orientation reinforced with patient. Offered emotional support. Given meds. Continued q 15 minute checks. R--Patient not receptive to encouragement by staff. Remains sad, depressed and wishing to die.

## 2014-10-20 NOTE — [facility] Group Notes (Signed)
[facility] LCSW Group Therapy  10/20/2014 2:31 PM  Type of Therapy:  Group Therapy  Participation Level:  Did Not Attend   [name], MSW, LCSWA 10/20/2014, 2:31 PM

## 2014-10-20 NOTE — Progress Notes (Signed)
NUTRITION FOLLOW-UP  INTERVENTION:  Meals and Snacks: encourage menu completion to best meet pt preferences Medical Food Supplement Therapy: continue Ensure Plus QID with meals as per order (Ensure Enlive: each supplement provides 350 kcal and 20 grams of protein)  NUTRITION DIAGNOSIS:   No PES statement at this time  GOAL:   Patient will meet greater than or equal to 90% of their needs; ongoing  MONITOR:  Energy Intake, Anthropometrics  ASSESSMENT:  Diet Order: Regular  Current Nutrition: recorded po intake 45-50% of meals since last follow-up. Per documentation pt taking Ensure supplements, 100% of Ensure last night. Pt meeting nutritional needs with po intake and atleast one Ensure per day at this time.  Anthropometrics: Pt continues to show weight gain since admission. Filed Weights   10/18/14 0500 10/19/14 0556 10/20/14 0500  Weight: 92 lb (41.731 kg) 93 lb (42.185 kg) 92 lb 8 oz (41.958 kg)    Labs: reviewed, no new labs  Meds: reviewed  After review will sign off. Please reconsult RD if change in nutritional status.   Dwyane Luo, [place], LDN Pager [phone]

## 2014-10-20 NOTE — Progress Notes (Signed)
Adult Services Patient-Family Contact/Session  Attendees:  Dr. Jerilee Hoh, Dossie Arbour, LCSW, [place] and Dell Ponto, Pt's sons  Goal(s):  Update on progress, concerns, financial planning contacts   Safety Concerns:  None  Narrative:  Dr. Jerilee Hoh updated family on some minimal improvements in Pt's weight.  Family noted some difference in content of delusions and some improvement in intensity. They pointed out that Pt able to reluctantly say that gaining weight has "been one good thing that came out of being here".  Family was contacted by [facility], Elliot Gault and will follow up with them by email. Expressed continuing concerns regarding inability to pay bills on Pt behalf as she has refused to cooperate with sons to pay them. Notified family that Dr. Jerilee Hoh will be on vacation for next 2 weeks and Dr. Bary Leriche will be covering.  Barrier(s):  None   Interventions:  Continuing to titrate clozaril, facilitated contact with financial services  Recommendation(s):    Follow-up Required:  Yes, will likely talk next Monday.  Beginning Luvox to augment Clozaril and will check Clozaril level Friday.  Explanation:    August Saucer, MSW, LCSW 10/20/2014, 3:21 PM

## 2014-10-20 NOTE — Plan of Care (Signed)
Problem: Ineffective individual coping Goal: LTG: Patient will report a decrease in negative feelings Outcome: Not Progressing Patient continues to report feeling sad and apathetic.

## 2014-10-20 NOTE — [facility] Group Notes (Signed)
[facility] Group Notes:  (Nursing/MHT/Case Management/Adjunct)  Date:  10/20/2014  Time:  1:10 PM  Type of Therapy:  Psychoeducational Skills  Participation Level:  Did Not Attend   Melissa George 10/20/2014, 1:10 PM

## 2014-10-20 NOTE — Progress Notes (Addendum)
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 11:52 AM

## 2014-10-20 NOTE — [facility] Group Notes (Signed)
[place] LCSW Group Therapy  10/20/2014 3:43 PM  Type of Therapy:  Group Therapy  Participation Level:  Did Not Attend  Participation Quality:     Affect:     Cognitive:     Insight:     Engagement in Therapy:     Modes of Intervention:     Summary of Progress/Problems:  August Saucer, MSW, LCSW 10/20/2014, 3:43 PM

## 2014-10-21 MED ORDER — CLOZAPINE 25 MG PO TABS
75.0000 mg | ORAL_TABLET | Freq: Every day | ORAL | Status: DC
  Administered 2014-10-21 – 2014-10-22 (×2): 75 mg via ORAL
  Filled 2014-10-21 (×2): qty 3

## 2014-10-21 NOTE — Tx Team (Signed)
Interdisciplinary Treatment Plan Update (Adult)  Date:  10/21/2014 Time Reviewed:  4:34 PM  Progress in Treatment: Attending groups: No. Participating in groups:  No. Taking medication as prescribed:  Yes. Tolerating medication:  Yes. Family/Significant othe contact made:  Yes, individual(s) contacted:  sons Patient understands diagnosis:  No. Discussing patient identified problems/goals with staff:  Yes. Medical problems stabilized or resolved:  Yes. Denies suicidal/homicidal ideation: No. Issues/concerns per patient self-inventory:  Yes. Other:  New problem(s) identified: No, Describe:     Discharge Plan or Barriers:  Reason for Continuation of Hospitalization: Anxiety Delusions  Depression Other; describe After care planning  Comments:Since 6/29 she has been refusing meds. She eventually takes them but needs significant encouragement from nursing.Ptient denies consistently having auditory or visual hallucinations. She is not seen interacting to internal stimuli. However strong delusional beliefs continue. There has not much improvement with anti-psychotics. [facility] bed declined on 09/15/2014, SW made new referral on 10/21/2014, called and confirmed received with intake staff, Janett Billow.  Estimated length of stay:7 days  New goal(s):  Review of initial/current patient goals per problem list:   See Plan of Care  Attendees: Patient:  Melissa George 7/12/20164:34 PM  Family:   7/12/20164:34 PM  Physician:  Jerilee Hoh 7/12/20164:34 PM  Nursing:    7/12/20164:34 PM  Case Manager:   7/12/20164:34 PM  Counselor:   7/12/20164:34 PM  Other:  Dossie Arbour, LCSW 7/12/20164:34 PM  Other:  Carmell Austria, [place] 7/12/20164:34 PM  Other:  Everitt Amber, LRT 7/12/20164:34 PM  Other:  7/12/20164:34 PM  Other:  7/12/20164:34 PM  Other:  7/12/20164:34 PM  Other:  7/12/20164:34 PM  Other:  7/12/20164:34 PM  Other:  7/12/20164:34 PM  Other:   7/12/20164:34 PM   Scribe for Treatment Team:    Dossie Arbour P,10/21/2014, 4:34 PM

## 2014-10-21 NOTE — Progress Notes (Addendum)
D: Patient endorses passive SI but agrees to contract.  Patient affect and mood are depressed.  Patient did NOT attend evening group. No distress noted. A: Support and encouragement offered. Scheduled medications given to pt. 1:1 observation continued for patient safety. R: Patient receptive. Patient remains safe on the unit.

## 2014-10-21 NOTE — Progress Notes (Signed)
Patient refused breakfast this  Am shift and lunch. Patient remains on bed in afetal position . Voice soft and low . Minimal eye contact . Remains to have fixed delusions about her finances. Patient drank both bottles of supplements. Instructed to get out of bed  And sit in chair  Compliant with this . No group participation  this shift . affect flat  With depressed  Mood . No interaction with staff or peers . Physical therapy on unit walked patient . Denies suicidal ideation and hallucinations . Denies pain concerns . Body odor noted .

## 2014-10-21 NOTE — Progress Notes (Signed)
Recreation Therapy Notes  Date: 07.12.16 Time: 3:15 Location: Craft Room  Group Topic: Self-expression  Goal Area(s) Addresses:  Patient will identify one color per emotion listed on wheel. Patient will verbalize benefit of using art as a means of self-expression. Patient will verbalize one emotion experienced during session. Patient will be educated on other forms of self-expression.  Behavioral Response: Did not attend  Intervention: Emotion Wheel  Activity: Patients were given a worksheet with 7 emotions on it. Patients were instructed to pick a color for each emotion.  Education: LRT educated patients on different forms of self-expression.  Education Outcome: Patient did not attend group.  Clinical Observations/Feedback: Patient did not attend group.  Leonette Monarch, LRT/CTRS 10/21/2014 4:12 PM

## 2014-10-21 NOTE — [facility] Group Notes (Signed)
[place] Group Notes:  (Nursing/MHT/Case Management/Adjunct)  Date:  10/21/2014  Time:  2:21 PM  Type of Therapy:  Psychoeducational Skills  Participation Level:  Did Not Attend   Adela Lank [facility] 10/21/2014, 2:21 PM

## 2014-10-21 NOTE — Progress Notes (Addendum)
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 Referrals  [place] @ [phone] Spoke with [facility] onsite liaison @ [phone] Referral accepted for [facility] for [facility] RN on 08/06/14 with PT, SW, and Dietitian to follow [facility] referral and documentation faxed to [facility] via [employer] [facility]. Fax # [phone]  Medical Decision Making:  Review of Psycho-Social Stressors (1), Review or order clinical lab tests (1), Order AIMS Test (2), Established Problem, Worsening (2) and Review of Medication Regimen & Side Effects (2)    Hildred Priest 10/21/2014, 11:03 AM

## 2014-10-21 NOTE — Evaluation (Signed)
Physical Therapy Evaluation Patient Details Name: Melissa George MRN: [id number] DOB: [birth year] Today's Date: 10/21/2014   History of Present Illness  Pt is a 64 y.o. female admitted from ED 09/13/14 for paranoid and delusional thoughts and worsening depressive symptoms.  Per notes, passive suicidality still present.  Clinical Impression  Currently pt demonstrates impairments with activity tolerance, balance, strength, and limitations with functional mobility.  Prior to admission, pt was independent without AD.  Pt lives alone in 1 level home with 4 steps to enter (no railing).  Pt required encouragement to participate in PT session (pt laying in bed upon therapist entering room).  Currently pt is CGA with ambulation (no AD) for safety; no loss of balance noted with ambulation but gait mechanic impairments noted and distance limited d/t SOB and pt c/o fatigue and needing to lay back down.  Pt would benefit from skilled PT to address above noted impairments and functional limitations.  Recommend pt discharge to home with HHPT and supervision for safety when medically appropriate.     Follow Up Recommendations Home health PT; 24/7 supervision for safety    Equipment Recommendations  None recommended by PT    Recommendations for Other Services       Precautions / Restrictions Precautions Precautions: Fall Restrictions Weight Bearing Restrictions: No      Mobility  Bed Mobility Overal bed mobility: Independent                Transfers Overall transfer level: Needs assistance Equipment used: None Transfers: Sit to/from Stand Sit to Stand: Supervision            Ambulation/Gait Ambulation/Gait assistance: Min guard (for safety) Ambulation Distance (Feet): 50 Feet (limited d/t SOB/fatigue per pt report) Assistive device: None       General Gait Details: crouched gait (flexed trunk, flexed hips and knees); decreased B step length/foot clearance/heelstrike; CGA for  safety but no loss of balance during ambulation  Stairs            Wheelchair Mobility    Modified Rankin (Stroke Patients Only)       Balance Overall balance assessment: Needs assistance Sitting-balance support: Bilateral upper extremity supported Sitting balance-Leahy Scale: Fair     Standing balance support: No upper extremity supported Standing balance-Leahy Scale: Fair                               Pertinent Vitals/Pain Pain Assessment: 0-10 Pain Score: 3  Pain Location: low back pain Pain Descriptors / Indicators: Sore Pain Intervention(s): Limited activity within patient's tolerance;Monitored during session;Repositioned  At rest pt's HR 100 bpm and with activity pt's HR 113 bpm. At rest pt's O2 93% on room air and with activity pt's O2 90% on room air (quickly recovers to 92% on room air).    Home Living Family/patient expects to be discharged to:: Private residence (per notes, may move in with a friend) Living Arrangements: Alone Available Help at Discharge:  (per notes, pt's friend able to assist and also will continue Home Health services)   Home Access: Stairs to enter Entrance Stairs-Rails: None Entrance Stairs-Number of Steps: 4 Home Layout: One level Home Equipment: None      Prior Function Level of Independence: Independent               Hand Dominance        Extremity/Trunk Assessment   Upper Extremity Assessment: Generalized weakness  Lower Extremity Assessment: Generalized weakness      Cervical / Trunk Assessment: Kyphotic  Communication   Communication: No difficulties  Cognition Arousal/Alertness: Awake/alert Behavior During Therapy: Flat affect (decreased mood) Overall Cognitive Status: Within Functional Limits for tasks assessed                      General Comments  Nursing cleared pt for participation in physical therapy.  Pt requiring encouragement to participate.  Pt reporting  that she is unable to have her drink because she can't open the bottle; after therapist opened pt's Ensure, pt required encouragement to sit up to drink it but pt sat up and drank the entire Ensure (nursing notified).  Nursing reports pt has been ambulating independently to meals and no falls reported.    Exercises  Pt declined d/t fatigue.      Assessment/Plan    PT Assessment Patient needs continued PT services  PT Diagnosis Difficulty walking;Abnormality of gait;Generalized weakness   PT Problem List Decreased strength;Decreased activity tolerance;Decreased range of motion;Decreased balance;Decreased mobility;Cardiopulmonary status limiting activity  PT Treatment Interventions DME instruction;Gait training;Stair training;Functional mobility training;Therapeutic activities;Therapeutic exercise;Balance training;Patient/family education   PT Goals (Current goals can be found in the Care Plan section) Acute Rehab PT Goals Patient Stated Goal: To walk like she did when she first came to [facility] PT Goal Formulation: With patient Time For Goal Achievement: 11/04/14 Potential to Achieve Goals: Fair    Frequency Min 2X/week   Barriers to discharge        Co-evaluation               End of Session Equipment Utilized During Treatment: Gait belt Activity Tolerance: Patient limited by fatigue Patient left: in bed Nurse Communication: Mobility status         Time: 0850-0910 PT Time Calculation (min) (ACUTE ONLY): 20 min   Charges:   PT Evaluation $Initial PT Evaluation Tier I: 1 Procedure     PT G CodesLeitha George 11-03-2014, 9:33 AM Melissa George, [place]

## 2014-10-21 NOTE — Plan of Care (Signed)
Problem: Alteration in mood Goal: LTG-Pt's behavior demonstrates decreased signs of depression (Patient's behavior demonstrates decreased signs of depression to the point the patient is safe to return home and continue treatment in an outpatient setting)  Outcome: Not Progressing Isolators to room

## 2014-10-21 NOTE — Progress Notes (Signed)
[place] LCSW Group Therapy  10/21/2014 1:01 PM  Type of Therapy:  Group Therapy  Participation Level:  Did Not Attend  Participation Quality:    Affect:    Cognitive:    Insight:    Engagement in Therapy:    Modes of Intervention:    Summary of Progress/Problems:  Melissa George 10/21/2014, 1:01 PM

## 2014-10-22 LAB — CBC WITH DIFFERENTIAL/PLATELET
Basophils Absolute: 0 10*3/uL (ref 0–0.1)
Basophils Relative: 0 %
EOS ABS: 0.3 10*3/uL (ref 0–0.7)
EOS PCT: 3 %
HCT: 42.1 % (ref 35.0–47.0)
HEMOGLOBIN: 14.3 g/dL (ref 12.0–16.0)
LYMPHS ABS: 1 10*3/uL (ref 1.0–3.6)
LYMPHS PCT: 11 %
MCH: 32.1 pg (ref 26.0–34.0)
MCHC: 34 g/dL (ref 32.0–36.0)
MCV: 94.5 fL (ref 80.0–100.0)
Monocytes Absolute: 1.1 10*3/uL — ABNORMAL HIGH (ref 0.2–0.9)
Monocytes Relative: 12 %
Neutro Abs: 6.4 10*3/uL (ref 1.4–6.5)
Neutrophils Relative %: 74 %
PLATELETS: 168 10*3/uL (ref 150–440)
RBC: 4.45 MIL/uL (ref 3.80–5.20)
RDW: 13.4 % (ref 11.5–14.5)
WBC: 8.9 10*3/uL (ref 3.6–11.0)

## 2014-10-22 NOTE — Plan of Care (Signed)
Problem: Ineffective individual coping Goal: STG: Patient will remain free from self harm Outcome: Progressing No self harm reported or observed

## 2014-10-22 NOTE — Progress Notes (Signed)
Recreation Therapy Notes  Date: 07.12.16 Time: 3:00 pm Location: Craft Room  Group Topic: Self-esteem  Goal Area(s) Addresses:  Patient will write at least one positive trait about self. Patient will verbalize benefit of having a good self-esteem.  Behavioral Response: Did not attend  Intervention: I Am  Activity: Patients were given a worksheet with the letter I and instructed to fill the letter with positive traits about themselves.  Education: LRT educated patients on different ways to increase self-esteem   Education Outcome: Patient did not attend group.  Clinical Observations/Feedback: Patient did not attend group.  Leonette Monarch, LRT/CTRS 10/22/2014 3:51 PM

## 2014-10-22 NOTE — Progress Notes (Signed)
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 Family is very concerned about the patient's ability to maintain her stability after discharge. They are unable to afford the cost of a assisted living facility. They are unable to have the patient live with them.  [place] are also out of the [place] and there is no other family that can help.  As of now our discharge plan includes: Patient will moving with her friend and will continue to receive home health.  Once discharged a copy of her discharge summary will be faxed to her primary care provider and her home health agency:  Primary care :Dr.White-PCP on 08-20-14 at 10:50am [facility] [address] [place], [place] [address]  [phone]  Resources and Referrals  Acacia Villas @ [phone] Spoke with [place] onsite liaison @ [phone] Referral accepted for [facility] for [facility] RN on 08/06/14 with PT, SW, and Dietitian to follow [facility] referral and documentation faxed to [facility] via [employer]. Fax # [phone]  Medical Decision Making:  Review of  Psycho-Social Stressors (1), Review or order clinical lab tests (1), Order AIMS Test (2), Established Problem, Worsening (2) and Review of Medication Regimen & Side Effects (2)    Hildred Priest 10/22/2014, 9:16 AM

## 2014-10-22 NOTE — Progress Notes (Signed)
D. Patient in room most of shift. Offers minimal conversation. Pt eating and drinking small amounts of food and fluids. Pt is med compliant but does not attend group. A. Encouraged pt to come out of room. Pt given Ensure to drink, ensured pt drinking supplements before leaving room. Encouraged pt to verbalize feelings. R. Pt receptive, drank ensure. Did not go to group. Pt remains safe on unit q15 min safety rounds.

## 2014-10-22 NOTE — Progress Notes (Signed)
MEDICATION RELATED CONSULT NOTE - INITIAL   Pharmacy Consult for Clozapine Monitoring   Allergies  Allergen Reactions  . Ciprofloxacin Shortness Of Breath and Itching  . Levofloxacin Other (See Comments)    Reaction:  Unknown   . Morphine And Related Nausea And Vomiting  . Sulfa Antibiotics Hives and Itching  . Symbicort [Budesonide-Formoterol Fumarate] Other (See Comments)    Reaction:  Psychotic episode  . Advair Diskus [Fluticasone-Salmeterol] Anxiety    Labs:  Recent Labs  10/22/14 0629  WBC 8.9  HGB 14.3  HCT 42.1  PLT 168   Estimated Creatinine Clearance: 44.7 mL/min (by C-G formula based on Cr of 0.59).  Medical History: Past Medical History  Diagnosis Date  . Anxiety   . COPD (chronic obstructive pulmonary disease)   . Anxiety   . 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 ALPRAZolam  0.25 mg Oral TID WC  . cholecalciferol  400 Units Oral Q breakfast  . cloZAPine  75 mg Oral QHS  . docusate sodium  200 mg Oral BID  . feeding supplement (ENSURE ENLIVE)  237 mL Oral QID  . fluvoxaMINE  25 mg Oral QHS  . megestrol  800 mg Oral QHS  . mirtazapine  45 mg Oral QHS  . multivitamin with minerals  1 tablet Oral Q breakfast  . nicotine  21 mg Transdermal Daily  . polyethylene glycol  17 g Oral Q M,W,F,Su-1800  . senna  2 tablet Oral QHS  . tiotropium  18 mcg Inhalation Daily    Assessment: Patient ordered Clozapine '75mg'$  QHS  ANC:  6.4 on 7/13  Plan:  Monitor ANC on weekly basis.  F/U labs ordered for 7/20  Marielis Samara C 10/22/2014,8:22 AM

## 2014-10-22 NOTE — [facility] Group Notes (Signed)
[facility] LCSW Group Therapy  10/22/2014 2:10 PM  Type of Therapy:  Group Therapy  Participation Level:  Did Not Attend  Participation Quality:    Affect:    Cognitive:    Insight:    Engagement in Therapy:    Modes of Intervention:    Summary of Progress/Problems:  Melissa George 10/22/2014, 2:10 PM

## 2014-10-22 NOTE — [facility] Group Notes (Signed)
[facility] LCSW Aftercare Discharge Planning Group Note  10/22/2014 2:11 PM  Participation Quality:  Did not attend  Affect:    Cognitive:    Insight:    Engagement in Group:    Modes of Intervention:    Summary of Progress/Problems:  Melissa George 10/22/2014, 2:11 PM

## 2014-10-22 NOTE — [facility] Group Notes (Signed)
[facility] Group Notes:  (Nursing/MHT/Case Management/Adjunct)  Date:  10/22/2014  Time:  3:45 PM  Type of Therapy:  Psychoeducational Skills  Participation Level:  Did Not Attend    Adela Lank [facility] 10/22/2014, 3:45 PM

## 2014-10-22 NOTE — Progress Notes (Signed)
Patient endorses passive SI but agrees to contract. Patient affect and mood are depressed. Patient did NOT attend evening group. No distress noted. A: Support and encouragement offered. Scheduled medications given to pt. 1:1 observation continued for patient safety. R: Patient receptive. Patient remains safe on the unit.

## 2014-10-23 LAB — COMPREHENSIVE METABOLIC PANEL
ALBUMIN: 3 g/dL — AB (ref 3.5–5.0)
ALT: 47 U/L (ref 14–54)
AST: 33 U/L (ref 15–41)
Alkaline Phosphatase: 43 U/L (ref 38–126)
Anion gap: 13 (ref 5–15)
BILIRUBIN TOTAL: 0.3 mg/dL (ref 0.3–1.2)
BUN: 42 mg/dL — AB (ref 6–20)
CO2: 28 mmol/L (ref 22–32)
Calcium: 8.9 mg/dL (ref 8.9–10.3)
Chloride: 100 mmol/L — ABNORMAL LOW (ref 101–111)
Creatinine, Ser: 0.67 mg/dL (ref 0.44–1.00)
GFR calc non Af Amer: >60 mL/min (ref >60)
GLUCOSE: 206 mg/dL — AB (ref 65–99)
POTASSIUM: 3.6 mmol/L (ref 3.5–5.1)
SODIUM: 141 mmol/L (ref 135–145)
Total Protein: 6.5 g/dL (ref 6.5–8.1)

## 2014-10-23 NOTE — Progress Notes (Signed)
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 Senokot to 2 tablets by mouth daily at bedtime. Prune juice ordered with every meal.  Continue miralax every other day. Continue Preparation H 3 times a day prn.    Vitals: Continue with vital signs q 12 hours  Labs: I will order Clozaril level this evening, a comprehensive metabolic panel, I will do. The patient's EKG today.  Disposition: Family is very concerned about the patient's ability to maintain her stability after discharge. They are unable to afford the cost of a assisted living facility. They are unable to have the patient live with them.  [place] are also out of the [place] and there is no other family that can help.  As of now our discharge plan includes: Patient will moving with her friend and will continue to receive home health.  Once discharged a copy of her discharge summary will be faxed to her primary care provider and her home health agency:  Primary care :Dr.White-PCP on 08-20-14 at 10:50am [facility]. [place], [place] [address]  [phone]  Resources and Referrals  Alexis @  [phone] Spoke with [place] onsite liaison @ [phone] Referral accepted for [facility] for [facility] RN on 08/06/14 with PT, SW, and Dietitian to follow [facility] referral and documentation faxed to [facility] via [employer] [facility]. Fax # [phone]  Medical Decision Making:  Review of Psycho-Social Stressors (1), Review or order clinical lab tests (1), Order AIMS Test (2), Established Problem, Worsening (2) and Review of Medication Regimen & Side Effects (2)    Hildred Priest 10/23/2014, 11:47 AM

## 2014-10-23 NOTE — Progress Notes (Signed)
D: Pt denies SI/HI/AVH. Patient's mood is irritable, affect is flat and sad, expressed hopelessness and helplessness. Pt   appears anxious and restless, was given medication for anxiety.   A: Pt was offered support and encouragement. Pt was given scheduled medications. Pt was encouraged to attend groups. Q 15 minute checks were done for safety.  R: Patient did not attend group, isolates herself to the room, Pt is compliant with medication. Pt receptive to treatment and safety maintained on unit.

## 2014-10-23 NOTE — BHH Group Notes (Signed)
[place] Group Notes:  (Nursing/MHT/Case Management/Adjunct)  Date:  10/23/2014  Time:  9:56 PM  Type of Therapy:  Group Therapy  Participation Level:  Did Not Attend   Ivy Meriwether [name] Marquarius Lofton 10/23/2014, 9:56 PM

## 2014-10-23 NOTE — Plan of Care (Signed)
Problem: Alteration in mood Goal: STG-Patient reports thoughts of self-harm to staff Outcome: Progressing Patient denies SI/HI, 15 minutes checks maintained.

## 2014-10-23 NOTE — Plan of Care (Signed)
Problem: Ineffective individual coping Goal: LTG: Patient will report a decrease in negative feelings Outcome: Not Progressing Continue to to be negative

## 2014-10-23 NOTE — Progress Notes (Signed)
Patient remains isolated to her room . No participation with unit programming . Compliant with medication . No hygiene efforts made noted odorous . Patient walked in hall  With staff . When she was walked with  phisical therapy heart rate increased and O2 states went down to 88. Patient remain slow , whispered voice. Patient able to speak to son this afternoon unable to stand seated at the phones

## 2014-10-23 NOTE — Progress Notes (Signed)
Physical Therapy Treatment Patient Details Name: Melissa George MRN: [id number] DOB: 1950/05/14 Today's Date: 10/23/2014    History of Present Illness Pt is a 64 y.o. female admitted from ED 09/13/14 for paranoid and delusional thoughts and worsening depressive symptoms.    PT Comments    Pt's ambulating distance limited d/t fatigue and mild SOB noted end of ambulation.  Pt's HR 103 bpm and O2 93% on room air pre-activity and post ambulation vitals noted to be 133 bpm for HR and 88% O2 on room air (within a minute pt's O2 increased >92% on room air); nursing notified regarding pt's vitals and nursing reported she would notify MD.  Pt appears deconditioned with decreased activity tolerance.  Nursing reports they walked pt earlier today and will walk pt later today too.  Will continue to work with pt to improve activity tolerance, strength, balance, and improve functional mobility.  Follow Up Recommendations  Supervision/Assistance - 24 hour     Equipment Recommendations  Rolling walker with 5" wheels    Recommendations for Other Services       Precautions / Restrictions Precautions Precautions: Fall Restrictions Weight Bearing Restrictions: No    Mobility  Bed Mobility Overal bed mobility: Needs Assistance Bed Mobility: Supine to Sit     Supine to sit: Mod assist     General bed mobility comments: assist for trunk  Transfers Overall transfer level: Needs assistance Equipment used: Rolling walker (2 wheeled) Transfers: Sit to/from Stand Sit to Stand: Min assist         General transfer comment: assist to initiate stand  Ambulation/Gait Ambulation/Gait assistance: Min guard Ambulation Distance (Feet): 120 Feet Assistive device: Rolling walker (2 wheeled)       General Gait Details: mild crouched gait (flexed trunk, flexed hips and knees); decreased B step length/foot clearance/heelstrike; CGA for safety but no loss of balance during ambulation; limited distance  d/t fatigue   Stairs            Wheelchair Mobility    Modified Rankin (Stroke Patients Only)       Balance Overall balance assessment: Needs assistance   Sitting balance-Leahy Scale: Fair     Standing balance support: Bilateral upper extremity supported Standing balance-Leahy Scale: Fair                      Cognition Arousal/Alertness: Awake/alert Behavior During Therapy: Flat affect (decreased mood) Overall Cognitive Status: Within Functional Limits for tasks assessed                      Exercises      General Comments  Nursing cleared pt for participation in physical therapy.  Pt agreeable to PT session with encouragement.      Pertinent Vitals/Pain Pain Assessment: No/denies pain    Home Living                      Prior Function            PT Goals (current goals can now be found in the care plan section) Acute Rehab PT Goals Patient Stated Goal: To walk like she did when she first came to [facility] The PT Goal Formulation: With patient Time For Goal Achievement: 11/04/14 Potential to Achieve Goals: Fair Additional Goals Additional Goal #1: Perform objective balance assessment as appropriate. Progress towards PT goals: Progressing toward goals    Frequency  Min 2X/week    PT Plan Current plan remains  appropriate    Co-evaluation             End of Session Equipment Utilized During Treatment: Gait belt Activity Tolerance: Patient limited by fatigue Patient left: in bed     Time: 1445-1455 PT Time Calculation (min) (ACUTE ONLY): 10 min  Charges:  $Therapeutic Exercise: 8-22 mins                    G CodesLeitha Bleak 2014-11-13, 3:11 PM Leitha Bleak, [place]

## 2014-10-23 NOTE — BHH Counselor (Signed)
LCSW met with patient briefly today to check in with patient she was encouraged to come to group and LCSW volunteered to take patient outside this afternoon if she wished. Patient declined, she only whispered her answers and remained slumped on her side in bed. She was polite but annoyed.

## 2014-10-23 NOTE — BHH Group Notes (Signed)
[place] Group Notes:  (Nursing/MHT/Case Management/Adjunct)  Date:  10/23/2014  Time:  4:14 AM  Type of Therapy:  Group Therapy  Participation Level:  Did Not Attend    Summary of Progress/Problems:  Marylynn Pearson 10/23/2014, 4:14 AM

## 2014-10-23 NOTE — Care Management Note (Signed)
Case Management Note  Patient Details  Name: Melissa George MRN: [id number] Date of Birth: 28-Jan-1951  Subjective/Objective:                    Action/Plan:   Expected Discharge Date:                  Expected Discharge Plan:     In-House Referral:     Discharge planning Services     Post Acute Care Choice:    Choice offered to:     DME Arranged:    DME Agency:     HH Arranged:    [place] Agency:     Status of Service:     Medicare Important Message Given:    Date Medicare IM Given:    Medicare IM give by:    Date Additional Medicare IM Given:    Additional Medicare Important Message give by:     If discussed at [place] of Stay Meetings, dates discussed:    Additional Comments:  Orlean Bradford, RN 10/23/2014, 2:01 PM

## 2014-10-23 NOTE — BHH Group Notes (Signed)
[place] Group Notes:  (Nursing/MHT/Case Management/Adjunct)  Date:  10/23/2014  Time:  2:51 PM  Type of Therapy:  Psychoeducational Skills  Participation Level:  Did Not Attend   Adela Lank [facility] 10/23/2014, 2:51 PM

## 2014-10-23 NOTE — BHH Group Notes (Signed)
[place] LCSW Group Therapy  10/23/2014 12:36 PM  Type of Therapy:  Group Therapy  Participation Level:  Did Not Attend  Participation Quality:    Affect:    Cognitive:    Insight:    Engagement in Therapy:    Modes of Intervention:    Summary of Progress/Problems:  Joana Reamer 10/23/2014, 12:36 PM

## 2014-10-23 NOTE — Progress Notes (Signed)
Recreation Therapy Notes  Date: 07.14.16 Time: 3:00 pm Location: Craft Room  Group Topic: Leisure Education  Goal Area(s) Addresses:  Patient will identify activities for each letter of the alphabet. Patient will verbalize ability to integrate positive leisure into life post d/c. Patient will verbalize ability to use leisure as a coping mechanism.  Behavioral Response: Did not attend  Intervention: Leisure Alphabet  Activity: Patients were given a Leisure Air traffic controller and instructed to list a positive leisure activity for each letter of the alphabet.   Education: LRT educated patients on what is needed to participate in leisure activities.  Education Outcome: Patient did not attend group.   Clinical Observations/Feedback: Patient did not attend group.   Leonette Monarch, LRT/CTRS 10/23/2014 4:10 PM

## 2014-10-24 DIAGNOSIS — F061 Catatonic disorder due to known physiological condition: Secondary | ICD-10-CM

## 2014-10-24 DIAGNOSIS — F332 Major depressive disorder, recurrent severe without psychotic features: Secondary | ICD-10-CM

## 2014-10-24 LAB — URINALYSIS COMPLETE WITH MICROSCOPIC (ARMC ONLY)
Bacteria, UA: NONE SEEN
Bilirubin Urine: NEGATIVE
Glucose, UA: NEGATIVE mg/dL
Hgb urine dipstick: NEGATIVE
Ketones, ur: NEGATIVE mg/dL
Leukocytes, UA: NEGATIVE
Nitrite: NEGATIVE
Protein, ur: NEGATIVE mg/dL
Specific Gravity, Urine: 1.02 (ref 1.005–1.030)
Squamous Epithelial / HPF: NONE SEEN
WBC, UA: NONE SEEN WBC/hpf (ref 0–5)
pH: 8 (ref 5.0–8.0)

## 2014-10-24 LAB — CKMB (ARMC ONLY): CK, MB: 4.5 ng/mL (ref 0.5–5.0)

## 2014-10-24 LAB — GLUCOSE, CAPILLARY: Glucose-Capillary: 109 mg/dL — ABNORMAL HIGH (ref 65–99)

## 2014-10-24 LAB — TROPONIN I: Troponin I: <0.03 ng/mL (ref <0.031)

## 2014-10-24 LAB — HEMOGLOBIN A1C
Hgb A1c MFr Bld: 5.6 % (ref 4.0–6.0)
Hgb A1c MFr Bld: 5.4 % (ref 4.0–6.0)

## 2014-10-24 MED ORDER — FLUVOXAMINE MALEATE 50 MG PO TABS: 50.0000 mg | ORAL_TABLET | Freq: Every day | ORAL | Status: DC

## 2014-10-24 MED ORDER — DOCUSATE SODIUM 100 MG PO CAPS: 200.0000 mg | ORAL_CAPSULE | Freq: Two times a day (BID) | ORAL | Status: DC

## 2014-10-24 MED ORDER — ALPRAZOLAM 0.25 MG PO TABS: 0.2500 mg | ORAL_TABLET | Freq: Three times a day (TID) | ORAL | Status: DC

## 2014-10-24 MED ORDER — MAGNESIUM CITRATE PO SOLN
1.0000 | ORAL | Status: DC
  Administered 2014-10-24 – 2014-10-27 (×2): 1 via ORAL
  Filled 2014-10-24 (×3): qty 296

## 2014-10-24 MED ORDER — CLOZAPINE 25 MG PO TABS: 25.0000 mg | ORAL_TABLET | Freq: Every day | ORAL | Status: DC

## 2014-10-24 MED ORDER — FLUVOXAMINE MALEATE 50 MG PO TABS
50.0000 mg | ORAL_TABLET | Freq: Every day | ORAL | Status: DC
  Administered 2014-10-24 – 2014-10-28 (×5): 50 mg via ORAL
  Filled 2014-10-24 (×5): qty 1

## 2014-10-24 MED ORDER — MAGNESIUM CITRATE PO SOLN: 1.0000 | ORAL | Status: DC

## 2014-10-24 MED ORDER — CHOLECALCIFEROL 400 UNIT/ML PO LIQD: 400.0000 [IU] | Freq: Every day | ORAL | Status: DC

## 2014-10-24 MED ORDER — CLOZAPINE 25 MG PO TABS
25.0000 mg | ORAL_TABLET | Freq: Every day | ORAL | Status: DC
  Administered 2014-10-24 – 2014-10-28 (×5): 25 mg via ORAL
  Filled 2014-10-24 (×5): qty 1

## 2014-10-24 NOTE — Progress Notes (Signed)
Patient is alert and oriented x 2- 3 with periods of confusion to time and situation, reoriented as needed. Patient appears anxious, frail, and restless. Vital signs WNL,Patient was encouraged to ambulate from her room to medication room no dyspnea on exertion noted, oxygen saturation was 95% on RA. 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 Patient is complaint with medication, not atending  unit group meeting. Verbalization of feelings encouraged, 15 minutes checks maintained, will continue to monitor.

## 2014-10-24 NOTE — BHH Group Notes (Signed)
[place] Group Notes:  (Nursing/MHT/Case Management/Adjunct)  Date:  10/24/2014  Time:  11:51 AM  Type of Therapy:  Psychoeducational Skills  Participation Level:  Did Not Attend   Celso Amy 10/24/2014, 11:51 AM

## 2014-10-24 NOTE — Plan of Care (Signed)
Problem: Ineffective individual coping Goal: STG: Patient will participate in after care plan Outcome: Progressing Patient encouraged to participate in treatment plan and medication regimen.   Problem: Alteration in mood Goal: LTG-Patient reports reduction in suicidal thoughts (Patient reports reduction in suicidal thoughts and is able to verbalize a safety plan for whenever patient is feeling suicidal)  Outcome: Progressing Patient denies SI/HI,1:1 safety sitter @ bedside.

## 2014-10-24 NOTE — BHH Group Notes (Signed)
[facility] LCSW Aftercare Discharge Planning Group Note  10/24/2014 3:50 PM  Participation Quality:  did not attend group    [name], MSW, LCSWA 10/24/2014, 3:50 PM

## 2014-10-24 NOTE — Progress Notes (Addendum)
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 I will not order another Clozaril level at this point because the Clozaril and glucose were held last evening so it will not be a reliable level. My plan for now will be to restartthe Luvox and Clozaril however I will decrease the Clozaril dose to only 25 mg and will increase the Luvox dose to 50.  -ECT option has been discussed with patient (in several occacions) but she does not agree with this treatment option.  Unable to do this treatment as family does not have a healthcare power of attorney and cannot consent for patient. Family is also not in favor of ECT.   GAD: continue xanax  0.25 mg tid with meals   Insomnia: no issues over the last week  Vitamin D deficiency : Continue Vit D   Metabolic syndrome monitoring : HgA1c was 5.1 and Total cholesterol was 186.   Another hemoglobin A1c was ordered today as glucose  level last night was about 200.  Will also order fingerstick blood sugar 3 times a day.  Deconditioning: Physical therapy was consulted on July 11 for deconditioning as patient has not been getting out of bed much.  Orders  given for staff to work with the patient twice today for 15 minutes.  Weight loss/Anorexia: BMI of 14.6 at admission/weight 34.9 kg/76.9lbs.  Today  weight is 85 pounds. Continue mVT with minerals. Continue Ensure  qid. Patient has been evaluated by the dietitian.  Anorexia appears to be secondary to  severe anxiety and psychosis.  Continue  megace 800 mg q day.   COPD: Respiratory status is at baseline. Patient has severe COPD. At home she uses oxygen when necessary and at bedtime. Here she is only using the oxygen at night. Her saturations for the last 2 weeks have been above 90% . The patient is on Spiriva and duoneb. Continue 2 L oxygen at bedtime and when necessary shortness of breath. Prednisone taper was started/completed on 6/26.  COPD appears to be at baseline. Patient is only using oxygen at bedtime. Oxygen saturation remained above 90%.  Tachycardia: EKG shows HR of 111. Sinus rhythm.  Spoke with hospitalist who recommeded  low dose of coreg in case of worsening tachycardia.  Likely secondary to anxiety and treatment with clozaril.  ID: Family concern with possible Lyme's disease. Infectious disease evaluated the patient on 6/20. They repeated Lyme serology  (results were neg)and started a trial of doxycycline for 10 days which she will complete on 6/29.  ID feels is very unlikely patient is actually suffering from Lyme's disease.  Tobacco use disorder: Continue nicotine patch  Constipation: continue Colace 200 mg by mouth twice a day and Senokot to 2 tablets by mouth daily at bedtime.  I will order magnesium citrate twice per week. Vitals: Continue with vital signs q 12 hours  Labs: CBC from July 13 was within the normal limits (hemoglobin and hematocrit were within the normal limits. ANC was 6.4). )n 7/14 Compressive metabolic panel ordered  only shows a mildly decreased albumin.  EKG shows sinus tachycardia with a heart rate of 111. CK-MB and troponin were not elevated.  UA was clear.   Hemoglobin A1c was ordered today as yesterday her group as was about 200. Hemoglobin A1c is still pending   Disposition: Family is very concerned about the patient's ability to maintain her stability after discharge. They are unable to afford the cost of a assisted living facility. They are unable to have the patient live with them.  They are also out of the state and there is no other family that can help.  As of now our discharge plan includes: Patient will moving with her friend and will continue to receive home health.  Once discharged a copy of her discharge summary will be faxed to her primary care provider and her home health agency:  Primary care :Dr.White-PCP on 08-20-14 at 10:50am [facility] [address] [place], [place] [address]  [phone]  Resources and Referrals  [place] @ [phone] Spoke with [place] onsite liaison @ [phone] Referral accepted for [facility] for [facility] RN on 08/06/14 with PT, SW, and Dietitian to follow [facility] referral and documentation faxed to [facility] via canopy connect. Fax # [phone]  Medical Decision Making:  Review of Psycho-Social Stressors (1), Review or order clinical lab tests (1), Order AIMS Test (2), Established Problem, Worsening (2) and Review of Medication Regimen & Side Effects (2)    Hildred Priest 10/24/2014, 4:20 PM

## 2014-10-24 NOTE — Plan of Care (Signed)
Problem: Alteration in mood Goal: STG-Patient is able to discuss feelings and issues (Patient is able to discuss feelings and issues leading to depression)  Outcome: Progressing Patient is discussing her concerns and feeling with staff.

## 2014-10-24 NOTE — Progress Notes (Signed)
D: Pt denies SI/HI/AVH. Pt is sad and depressed, patient expresses hopelessness and helplessness.  Pt stated she feels tired assessed VS WNL,she appears less anxious and  is interacting with peers and staff appropriately.  A: Pt was offered support and encouragement. Pt was given scheduled medications. Placed on oxygen 2L/min via [place], Pt was encouraged to attend groups. Q 15 minute checks were done for safety.  R: Pt did not attend group meeting, Patient is compliant with medication. Pt receptive to treatment and safety maintained on unit.

## 2014-10-24 NOTE — Progress Notes (Signed)
Patient alert and oriented.  Has a flat sad affect.  Patient was assisted to bathroom and UA collected. She was also assisted with a shower.  Abd appears distended.  Patient reports that she had a "small bowel movement" today but pt is also a poor historian.  Patient walked in the hallway with Dr. Jerilee Hoh.  New orders obtained.  She ate a small amount of lunch with coaxing.   Will cont to monitor and encourage movement and interaction.

## 2014-10-25 DIAGNOSIS — F332 Major depressive disorder, recurrent severe without psychotic features: Secondary | ICD-10-CM

## 2014-10-25 LAB — GLUCOSE, CAPILLARY
GLUCOSE-CAPILLARY: 94 mg/dL (ref 65–99)
Glucose-Capillary: 111 mg/dL — ABNORMAL HIGH (ref 65–99)
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
## 2014-10-25 NOTE — Progress Notes (Signed)
Pt has been pleasant and cooperative. Pt continues to endorse having fleeting thoughts of suicide and hopelessness .Pt has been seclusive to her  room . Pt did not attend any  unit activities. Pt needs lots of encouragements to eat and take po meds. Will continue to observe and maintain a safe environment.

## 2014-10-25 NOTE — BHH Group Notes (Signed)
[facility] LCSW Group Therapy  10/25/2014 3:26 PM  Type of Therapy:  Group Therapy  Participation Level:  Did Not Attend  Modes of Intervention:  Discussion, Education, Socialization and Support  Summary of Progress/Problems: Patients identify obstacles, self-sabotaging and enabling behaviors. Patients explore aspects of self sabotage and enabling and how to limit these self-destructive behaviors in everyday life.  [place] MSW, [place]  10/25/2014, 3:26 PM

## 2014-10-25 NOTE — BHH Group Notes (Signed)
[facility] Group Notes:  (Nursing/MHT/Case Management/Adjunct)  Date:  10/25/2014  Time:  10:08 AM  Type of Therapy:  Group Therapy  Participation Level:  Did Not Attend  Summary of Progress/Problems:  Melissa George De'Chelle Blayne Garlick 10/25/2014, 10:08 AM

## 2014-10-25 NOTE — BHH Group Notes (Signed)
[facility] Group Notes:  (Nursing/MHT/Case Management/Adjunct)  Date:  10/25/2014  Time:  2:51 AM  Type of Therapy:  Group Therapy  Participation Level:  Did Not Attend    Melissa George 10/25/2014, 2:51 AM

## 2014-10-25 NOTE — BHH Group Notes (Signed)
[facility] LCSW Group Therapy  10/24/2014 9:15 AM  Type of Therapy:  Group Therapy  Participation Level:  Did Not Attend  Modes of Intervention:  Discussion, Education, Socialization and Support   Summary of Progress/Problems:Feelings around Relapse. Group members discussed the meaning of relapse and shared personal stories of relapse, how it affected them and others, and how they perceived themselves during this time. Group members were encouraged to identify triggers, warning signs and coping skills used when facing the possibility of relapse. Social supports were discussed and explored in detail.  Wray Kearns MSW, LCSWA  10/24/2014 4:15PM

## 2014-10-26 LAB — GLUCOSE, CAPILLARY
Glucose-Capillary: 95 mg/dL (ref 65–99)
Glucose-Capillary: 93 mg/dL (ref 65–99)

## 2014-10-26 MED ORDER — ALPRAZOLAM 0.25 MG PO TABS
0.2500 mg | ORAL_TABLET | Freq: Every day | ORAL | Status: DC
  Administered 2014-10-26 – 2014-10-28 (×3): 0.25 mg via ORAL
  Filled 2014-10-26 (×3): qty 1

## 2014-10-26 NOTE — BHH Group Notes (Signed)
[facility] Group Notes:  (Nursing/MHT/Case Management/Adjunct)  Date:  10/26/2014  Time:  9:02 AM  Type of Therapy:  goal setting   Participation Level:  Did Not Attend   Cel[place] Amy 10/26/2014, 9:02 AM

## 2014-10-26 NOTE — Plan of Care (Signed)
Problem: Ineffective individual coping Goal: LTG: Patient will report a decrease in negative feelings Outcome: Not Progressing Patient continues to state hopelessness and feelings of doom.  Goal: STG: Pt will be able to identify effective and ineffective STG: Pt will be able to identify effective and ineffective coping patterns  Outcome: Not Progressing Pt cannot identify any coping mechanisms.  Goal: STG: Patient will remain free from self harm Outcome: Progressing No self harm.  Goal: STG:Pt. will utilize relaxation techniques to reduce stress STG: Patient will utilize relaxation techniques to reduce stress levels  Outcome: Not Progressing Patient cannot note any relaxation techniques.  Goal: STG: Patient will participate in after care plan Outcome: Not Progressing Patient notes she is doomed and nothing will help, ever.

## 2014-10-26 NOTE — BHH Group Notes (Signed)
[facility] LCSW Group Therapy  10/26/2014 6:47 PM  Type of Therapy:  Group Therapy  Participation Level:  Did Not Attend  Modes of Intervention:  Discussion, Education, Role-play, Socialization and Support  Summary of Progress/Problems: Communications: Patients identify how individuals communicate with one another appropriately and inappropriately. Patients will be guided to discuss their thoughts, feelings, and behaviors related to barriers when communicating. The group will process together ways to execute positive and appropriate communications.   [place] MSW, [place]  10/26/2014, 6:47 PM
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
## 2014-10-26 NOTE — Progress Notes (Signed)
Pt continues to be seclusive to her room,staying in all day. Pt's needs lots of encouragement to eat and then she only eats about 5-10 %. Pt continues to endorse  having low energy and feeling hopeless. Pt continues to be compliant with taking po meds. Pt denies A/V hallucinations.

## 2014-10-26 NOTE — [facility] Group Notes (Signed)
[facility] Group Notes:  (Nursing/MHT/Case Management/Adjunct)  Date:  10/26/2014  Time:  10:24 PM  Type of Therapy:  Group Therapy  Participation Level:  Did Not Attend   Melissa George Melissa George 10/26/2014, 10:24 PM

## 2014-10-27 LAB — GLUCOSE, CAPILLARY: GLUCOSE-CAPILLARY: 99 mg/dL (ref 65–99)

## 2014-10-27 NOTE — Progress Notes (Addendum)
[facility] [place] NAME: Melissa George    MR#:  [id number]  DATE OF BIRTH:  08-20-1950  SUBJECTIVE:  CHIEF COMPLAINT:   "I don't feel well" Asked to evaluate patient by psychiatry for concerns about dehydration. Patient states "I don't feel well" she is however unable to further elaborate on this She does attest to having dyspnea on exertion which is fairly stable for her, denies any orthostatic symptoms or further medical symptomatology  REVIEW OF SYSTEMS:  CONSTITUTIONAL: No fever, positive fatigue or weakness.  EYES: No blurred or double vision.  EARS, NOSE, AND THROAT: No tinnitus or ear pain.  RESPIRATORY: No cough, positive shortness of breath, denies wheezing or hemoptysis.  CARDIOVASCULAR: No chest pain, orthopnea, edema.  GASTROINTESTINAL: No nausea, vomiting, diarrhea or abdominal pain.  GENITOURINARY: No dysuria, hematuria.  ENDOCRINE: No polyuria, nocturia,  HEMATOLOGY: No anemia, easy bruising or bleeding SKIN: No rash or lesion. MUSCULOSKELETAL: No joint pain or arthritis.   NEUROLOGIC: No tingling, numbness, weakness.  PSYCHIATRY: No anxiety positive depression.   PAST MEDICAL HISTORY:   Past Medical History  Diagnosis Date  . Anxiety   . COPD (chronic obstructive pulmonary disease)   . Anxiety   . 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 Hand surgery    . Appendectomy    . Abdominal hysterectomy    . Cesarean section      SOCIAL HISTORY:   History  Substance Use Topics  . Smoking status: Former Research scientist (life sciences)  . Smokeless tobacco: Not on file  . Alcohol Use: No    FAMILY HISTORY:   Family History  Problem Relation Age of Onset  . CAD Mother   . Thyroid cancer Other   . Lung cancer Other     DRUG ALLERGIES:                                                DRUG ALLERGIES:   Allergies  Allergen Reactions  . Ciprofloxacin Shortness Of Breath and Itching  . Levofloxacin Other (See Comments)    Reaction:  Unknown   . Morphine And Related Nausea And Vomiting  . Sulfa Antibiotics Hives and Itching  . Symbicort [Budesonide-Formoterol Fumarate] Other (See Comments)    Reaction:  Psychotic episode  . Advair Diskus [Fluticasone-Salmeterol] Anxiety    VITALS:  Blood pressure 119/76, pulse 130, temperature 98.2 F (36.8 C), temperature source Oral, resp. rate 18, height '5\' 1"'$  (1.549 m), weight 89 lb (40.37 kg), SpO2 91 %. had elevated heart rates during ambulation as noted above  PHYSICAL EXAMINATION:  VITAL SIGNS: Filed Vitals:   10/27/14 1419  BP:   Pulse: 130  Temp:   Resp:    GENERAL:64 y.o.female currently in no acute distress. Disheveled HEAD: Normocephalic, atraumatic.  EYES: Pupils equal, round, reactive to light. Extraocular muscles intact. No scleral icterus.  MOUTH: Moist mucosal membrane. Dentition intact. No abscess noted.  EAR, NOSE, THROAT: Clear without exudates. No external lesions.  NECK: Supple. No thyromegaly. No nodules. No JVD.  PULMONARY: Clear to ascultation, without wheeze rails or rhonci. No use of accessory muscles, poor respiratory effort. good air entry bilaterally CHEST: Nontender to palpation.  CARDIOVASCULAR: S1 and S2. Regular rate and rhythm. No murmurs, rubs, or  gallops. No edema. Pedal pulses 2+ bilaterally.  GASTROINTESTINAL: Soft, nontender, nondistended. No masses. Positive bowel sounds. No hepatosplenomegaly.  MUSCULOSKELETAL: No swelling, clubbing, or edema. Range of motion full in all extremities.  NEUROLOGIC: Cranial nerves II through XII are intact. No gross focal neurological deficits. Sensation intact. Reflexes intact.  SKIN: No ulceration, lesions, rashes, or cyanosis. Skin warm and dry. Turgor intact.  PSYCHIATRIC: Mood, affect flattened. The patient is awake,  alert and oriented x 3. Insight, judgment intact.      LABORATORY PANEL:   CBC  Recent Labs Lab 10/22/14 0629  WBC 8.9  HGB 14.3  HCT 42.1  PLT 168   ------------------------------------------------------------------------------------------------------------------  Chemistries   Recent Labs Lab 10/23/14 1808  NA 141  K 3.6  CL 100*  CO2 28  GLUCOSE 206*  BUN 42*  CREATININE 0.67  CALCIUM 8.9  AST 33  ALT 47  ALKPHOS 43  BILITOT 0.3   ------------------------------------------------------------------------------------------------------------------  Cardiac Enzymes  Recent Labs Lab 10/24/14 0940  TROPONINI <0.03   ------------------------------------------------------------------------------------------------------------------  RADIOLOGY:  No results found.  EKG:   Orders placed or performed during the hospital encounter of 09/13/14  . EKG 12-Lead  . EKG 12-Lead  . EKG 12-Lead  . EKG 12-Lead  . EKG 12-Lead  . EKG 12-Lead    ASSESSMENT AND PLAN:   64 year old Caucasian female who was originally admitted for paranoid and delusional thoughts with worsening depressive symptoms, asked to reevaluate the patient given concerns for potential dehydration.  1. Tachycardia: This appears to be the only tangible evidence of dehydration at this point, she does have minimal by mouth intake which is been waxing and waning throughout her admission. Despite this she does not have any further evidence on exam or with vital signs consistent with dehydration chief works I does not have any orthostatic symptoms as well. Her tachycardia occurred with ambulation and likely the setting of her COPD. As previously stated she should be on oxygen as needed for shortness of breath.  2. COPD, unspecified: Continue with DuoNeb treatments as needed, supplemental oxygen as needed, Spiriva, she has a documented allergy to inhaled steroids though she is not on these. She is actually  been evaluated by pulmonary during this admission as well  3. Constipation: Currently Senokot 2 tabs by mouth daily at bedtime with Colace 200 mg by mouth twice a day as well as the addition of MiraLAX every other day. Limited oral intake likely plays a role, however if remains an issue can increase MiraLAX to daily, and also increase to 2 tablets by mouth twice a day if required.  4. Major depressive disorder with severe/delusional disorder: Continue care per primary services (psychiatry)  All the records are reviewed and case discussed with Care Management/Social Workerr. Management plans discussed with the patient, family and they are in agreement.  CODE STATfull TOTAL TIME TAKING CARE OF THIS PATIENT:35 minutes.   POSSIBLE D/C IN 1-2 DAYS, DEPENDING ON CLINICAL CONDITION.   Yared Susan,  Karenann Cai.D on 10/27/2014 at 10:31 PM  Between 7am to 6pm - Pager - [phone]  After 6pm: House Pager: - [phone]  [facility]  Office  [phone]  CC: Primary care physician; No PCP Per Patient

## 2014-10-27 NOTE — Progress Notes (Signed)
Patient alert and oriented. Has a flat sad affect. Patient was assisted to bathroom and dayroom.  Abd appears less distended. Patient reports that she had a "small bowel movement" today but pt is also a poor historian. Patient walked in the hallway with PT. New orders obtained. She ate a small amount of dinner with coaxing. Will cont to monitor and encourage movement and interaction.

## 2014-10-27 NOTE — Progress Notes (Signed)
Physical Therapy Treatment Patient Details Name: Melissa George MRN: [id number] DOB: 1950-06-11 Today's Date: 10/27/2014    History of Present Illness Pt is a 64 y.o. female admitted from ED 09/13/14 for paranoid and delusional thoughts and worsening depressive symptoms.    PT Comments    Pt requiring encouragement to ambulate during session and requiring rest breaks after both ambulation trials d/t fatigue and SOB (O2 decreased to 91% at most during session on room air but HR increased from 110 bpm to 130 bpm with session activities); pt asking multiple times during session "can I lay down now".  Encouraged pt to go to group but pt declined.  Follow Up Recommendations  Supervision/Assistance - 24 hour     Equipment Recommendations  Rolling walker with 5" wheels    Recommendations for Other Services       Precautions / Restrictions Precautions Precautions: Fall Restrictions Weight Bearing Restrictions: No    Mobility  Bed Mobility Overal bed mobility: Needs Assistance Bed Mobility: Supine to Sit;Sit to Supine     Supine to sit: Min assist (x2 trials d/t pt laying down as soon as she sat up) Sit to supine: Independent   General bed mobility comments: assist for trunk  Transfers Overall transfer level: Needs assistance Equipment used: Rolling walker (2 wheeled) Transfers: Sit to/from [facility] Sit to Stand: Supervision Stand pivot transfers: Supervision          Ambulation/Gait Ambulation/Gait assistance: Supervision;Min guard Ambulation Distance (Feet):  (100 feet; 200 feet) Assistive device: Rolling walker (2 wheeled)       General Gait Details: mild crouched gait (flexed trunk, flexed hips and knees); decreased B step length/foot clearance/heelstrike; CGA to SBA for safety but no loss of balance during ambulation; limited distance d/t fatigue and SOB   Stairs            Wheelchair Mobility    Modified Rankin (Stroke Patients  Only)       Balance Overall balance assessment: Needs assistance Sitting-balance support: No upper extremity supported;Feet supported Sitting balance-Leahy Scale: Good     Standing balance support: Bilateral upper extremity supported Standing balance-Leahy Scale: Good                      Cognition Arousal/Alertness: Awake/alert Behavior During Therapy: Flat affect (decreased mood) Overall Cognitive Status: Within Functional Limits for tasks assessed                      Exercises  Pt declined any LE or UE specific ex's.    General Comments  Nursing cleared pt for participation in PT.  Pt agreeable to PT session with encouragement.      Pertinent Vitals/Pain Pain Score: 3  Pain Location: chronic low back pain Pain Descriptors / Indicators: Sore Pain Intervention(s): Limited activity within patient's tolerance;Monitored during session    Home Living                      Prior Function            PT Goals (current goals can now be found in the care plan section) Acute Rehab PT Goals Patient Stated Goal: To walk like she did when she first came to [facility] PT Goal Formulation: With patient Time For Goal Achievement: 11/04/14 Potential to Achieve Goals: Fair Additional Goals Additional Goal #1: Perform objective balance assessment as appropriate. Progress towards PT goals: Progressing toward goals    Frequency  Min 2X/week    PT Plan Current plan remains appropriate    Co-evaluation             End of Session Equipment Utilized During Treatment: Gait belt Activity Tolerance: Patient limited by fatigue (also limited d/t SOB) Patient left: in bed     Time: 1347-1410 PT Time Calculation (min) (ACUTE ONLY): 23 min  Charges:  $Therapeutic Exercise: 23-37 mins                    G CodesLeitha Bleak 11-07-2014, 2:27 PM Leitha Bleak, [place]
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
## 2014-10-27 NOTE — [facility] Group Notes (Signed)
[place] LCSW Group Therapy  10/27/2014 2:09 PM  Type of Therapy:  Group Therapy  Participation Level:  Did Not Attend  Participation Quality:    Affect:    Cognitive:    Insight:    Engagement in Therapy:    Modes of Intervention:    Summary of Progress/Problems:  Melissa George 10/27/2014, 2:09 PM

## 2014-10-27 NOTE — [facility] Group Notes (Signed)
[place] Group Notes:  (Nursing/MHT/Case Management/Adjunct)  Date:  10/27/2014  Time:  2:01 PM  Type of Therapy:  Psychoeducational Skills  Participation Level:  Did Not Attend  Summary of Progress/Problems:  Kathi Ludwig 10/27/2014, 2:01 PM

## 2014-10-27 NOTE — Progress Notes (Signed)
D: Patient endorses passive SI but agrees to contract. Patient affect and mood are depressed.  Patient did NOT attend evening group. Patient secluded in her room throughout the shift. No distress noted. A: Support and encouragement offered. Scheduled medications given to pt. Q 15 min checks continued for patient safety. 1:1 safety observation at bedtime for oxygen usage. R: Patient receptive. Patient remains safe on the unit.

## 2014-10-27 NOTE — [facility] Group Notes (Signed)
[facility] LCSW Aftercare Discharge Planning Group Note  10/27/2014 10:11 AM  Participation Quality:  DID NOT ATTEND  Affect:    Cognitive:    Insight:    Engagement in Group:    Modes of Intervention:    Summary of Progress/Problems:  Joana Reamer 10/27/2014, 10:11 AM

## 2014-10-28 LAB — BASIC METABOLIC PANEL
ANION GAP: 9 (ref 5–15)
BUN: 38 mg/dL — ABNORMAL HIGH (ref 6–20)
CALCIUM: 9 mg/dL (ref 8.9–10.3)
CO2: 27 mmol/L (ref 22–32)
Chloride: 104 mmol/L (ref 101–111)
Creatinine, Ser: 0.5 mg/dL (ref 0.44–1.00)
GFR calc Af Amer: >60 mL/min (ref >60)
GLUCOSE: 131 mg/dL — AB (ref 65–99)
POTASSIUM: 4.5 mmol/L (ref 3.5–5.1)
SODIUM: 140 mmol/L (ref 135–145)

## 2014-10-28 LAB — TROPONIN I
Troponin I: <0.03 ng/mL (ref <0.031)
Troponin I: 0.28 ng/mL — ABNORMAL HIGH (ref <0.031)

## 2014-10-28 LAB — CBC
HCT: 41.9 % (ref 35.0–47.0)
Hemoglobin: 13.9 g/dL (ref 12.0–16.0)
MCH: 31.3 pg (ref 26.0–34.0)
MCHC: 33.1 g/dL (ref 32.0–36.0)
MCV: 94.6 fL (ref 80.0–100.0)
Platelets: 306 10*3/uL (ref 150–440)
RBC: 4.43 MIL/uL (ref 3.80–5.20)
RDW: 13.4 % (ref 11.5–14.5)
WBC: 13.4 10*3/uL — AB (ref 3.6–11.0)

## 2014-10-28 LAB — GLUCOSE, CAPILLARY: Glucose-Capillary: 95 mg/dL (ref 65–99)

## 2014-10-28 NOTE — Progress Notes (Signed)
Patient very lethargic and not responsive initially to conversation this morning. VSS. Rapid response nurse called to evaluate patient due to difficulty to engage and having a blank stare. She came around [place] and assessed patient but no further action taken. Patient assisted to breakfast via wheelchair. She rapidly ate her breakfast and drank Ensure. She reports SI but contracts for safety. Denies HI or AVH. Will continue to monitor.

## 2014-10-28 NOTE — Progress Notes (Signed)
D: Patient endorses passive SI but agrees to contract. Patient affect and mood are depressed.  Patient did NOT attend evening group. Patient secluded in her room throughout the shift. No distress noted. A: Support and encouragement offered. Scheduled medications given to pt. Q 15 min checks continued for patient safety. 1:1 safety observation at bedtime for oxygen usage. R: Patient receptive. Patient remains safe on the unit.
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
## 2014-10-28 NOTE — [facility] Group Notes (Signed)
[facility] LCSW Group Therapy  10/28/2014 1:08 PM  Type of Therapy:  Group Therapy  Participation Level:  Did Not Attend  Participation Quality:    Affect:    Cognitive:    Insight:    Engagement in Therapy:    Modes of Intervention:    Summary of Progress/Problems:  Joana Reamer 10/28/2014, 1:08 PM

## 2014-10-28 NOTE — Progress Notes (Signed)
Patient c/o chest pain radiating to left arm at about 1830. VS obtained and Dr. Jimmye Norman notified. Ordered medical consult per Dr. Jimmye Norman. Continue to monitor.

## 2014-10-28 NOTE — Consult Note (Addendum)
I was contacted as on call psychiatrist was contacted as patient was having complaint, of chest pain. Vital signs were temp 98.3, pulse 117, respirations 20 and blood pressure 138/83. Nursing and wirer have spoke with hospitalist Dr. Anselm Jungling. I inquired if he wanted me to order any test/labs at this time. He indicated he was reviewing her chart and would evaluate her.   11:39PM Informed of elevated troponin of .28. Discussed case with hospitalist now on service, Dr. Lavetta Nielsen. We agree she should have additional monitoring and will be transferred to medicine at this time.

## 2014-10-28 NOTE — Progress Notes (Signed)
[facility] [place] NAME: Melissa George    MR#:  [id number]  DATE OF BIRTH:  [birth year]  SUBJECTIVE:  CHIEF COMPLAINT: have chest pain today. Said for 1 hour had pain 10/10, central and in left arm,continuous,  Worse with deep breath- now better 2-3/10.  REVIEW OF SYSTEMS:  CONSTITUTIONAL: No fever, fatigue or weakness.  EYES: No blurred or double vision.  EARS, NOSE, AND THROAT: No tinnitus or ear pain.  RESPIRATORY: No cough, shortness of breath, wheezing or hemoptysis.  CARDIOVASCULAR: positive for chest pain,No orthopnea, edema.  GASTROINTESTINAL: No nausea, vomiting, diarrhea or abdominal pain.  GENITOURINARY: No dysuria, hematuria.  ENDOCRINE: No polyuria, nocturia,  HEMATOLOGY: No anemia, easy bruising or bleeding SKIN: No rash or lesion. MUSCULOSKELETAL: No joint pain or arthritis.   NEUROLOGIC: No tingling, numbness, weakness.  PSYCHIATRY: No anxiety or depression.   ROS  DRUG ALLERGIES:   Allergies  Allergen Reactions  . Ciprofloxacin Shortness Of Breath and Itching  . Levofloxacin Other (See Comments)    Reaction:  Unknown   . Morphine And Related Nausea And Vomiting  . Sulfa Antibiotics Hives and Itching  . Symbicort [Budesonide-Formoterol Fumarate] Other (See Comments)    Reaction:  Psychotic episode  . Advair Diskus [Fluticasone-Salmeterol] Anxiety    VITALS:  Blood pressure 138/83, pulse 117, temperature 98.3 F (36.8 C), temperature source Oral, resp. rate 20, height '5\' 1"'$  (1.549 m), weight 40.37 kg (89 lb), SpO2 94 %.  PHYSICAL EXAMINATION:  GENERAL:  64 y.o.-year-old thin patient lying in the bed with no acute distress.  EYES: Pupils equal, round, reactive to light and accommodation. No scleral icterus. Extraocular muscles intact.  HEENT: Head atraumatic, normocephalic. Oropharynx and nasopharynx clear.  NECK:  Supple, no jugular venous distention. No thyroid enlargement, no tenderness.  LUNGS:  Normal breath sounds bilaterally, no wheezing, rales,rhonchi or crepitation. No use of accessory muscles of respiration.  CARDIOVASCULAR: S1, S2 normal, some tachycardia. No murmurs, rubs, or gallops.  ABDOMEN: Soft, nontender, nondistended. Bowel sounds present. No organomegaly or mass.  EXTREMITIES: No pedal edema, cyanosis, or clubbing.  NEUROLOGIC: Cranial nerves II through XII are intact. Muscle strength 5/5 in all extremities. Sensation intact. Gait not checked.  PSYCHIATRIC: The patient is alert and oriented x 3.  SKIN: No obvious rash, lesion, or ulcer.   Physical Exam LABORATORY PANEL:   CBC  Recent Labs Lab 10/28/14 1639  WBC 13.4*  HGB 13.9  HCT 41.9  PLT 306   ------------------------------------------------------------------------------------------------------------------  Chemistries   Recent Labs Lab 10/23/14 1808 10/28/14 1639  NA 141 140  K 3.6 4.5  CL 100* 104  CO2 28 27  GLUCOSE 206* 131*  BUN 42* 38*  CREATININE 0.67 0.50  CALCIUM 8.9 9.0  AST 33  --   ALT 47  --   ALKPHOS 43  --   BILITOT 0.3  --    ------------------------------------------------------------------------------------------------------------------  Cardiac Enzymes  Recent Labs Lab 10/24/14 0940 10/28/14 1639  TROPONINI <0.03 <0.03   ------------------------------------------------------------------------------------------------------------------  RADIOLOGY:  No results found.  ASSESSMENT AND PLAN:   * Chest pain  pain is tapering now.   Likely non cardiac - as troponin negative 4 hrs ago, will repeat one more now, and EKG.   Likely might be muscular or psychiatric reason.    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   She took some medicine for a while but then stopped.  * Tachycardia-    Will  add cardizem tablet for rate control.   Mainly it seems anxiety.  * COPD, unspecified: Continue with DuoNeb treatments as needed, supplemental oxygen as needed, Spiriva, she has a documented allergy to inhaled steroids though she is not on these. She is actually been evaluated by pulmonary during this admission as well  * Constipation: Currently Senokot 2 tabs by mouth daily at bedtime with Colace 200 mg by mouth twice a day as well as the addition of MiraLAX every other day. Limited oral intake likely plays a role, however if remains an issue can increase MiraLAX to daily, and also increase to 2 tablets by mouth twice a day if required.  * Major depressive disorder with severe/delusional disorder: Continue care per primary services (psychiatry)  All the records are reviewed and case discussed with Care Management/Social Workerr. Management plans discussed with the patient, family and they are in agreement.  CODE STATUS: Full  TOTAL TIME TAKING CARE OF THIS PATIENT:50 minutes.     Vaughan Basta M.D on 10/28/2014   Between 7am to 6pm - Pager - [phone]  After 6pm go to [email or web address] - password [email or web address] [facility]  [facility]  Office  [phone]  CC: Primary care physician; No PCP Per Patient

## 2014-10-29 ENCOUNTER — Encounter: Disposition: A | Discharge status: Other Acute Inpatient Hospital | Payer: Self-pay | Attending: Internal Medicine

## 2014-10-29 ENCOUNTER — Inpatient Hospital Stay
Admit: 2014-10-29 | Discharge: 2014-11-01 | DRG: 280 | Disposition: A | Discharge status: Other Acute Inpatient Hospital | Payer: Medicare Other | Attending: Internal Medicine | Admitting: Internal Medicine

## 2014-10-29 DIAGNOSIS — R630 Anorexia: Secondary | ICD-10-CM | POA: Diagnosis present

## 2014-10-29 DIAGNOSIS — K5900 Constipation, unspecified: Secondary | ICD-10-CM | POA: Diagnosis present

## 2014-10-29 DIAGNOSIS — F22 Delusional disorders: Secondary | ICD-10-CM | POA: Diagnosis present

## 2014-10-29 DIAGNOSIS — Z87891 Personal history of nicotine dependence: Secondary | ICD-10-CM | POA: Diagnosis not present

## 2014-10-29 DIAGNOSIS — F202 Catatonic schizophrenia: Secondary | ICD-10-CM | POA: Diagnosis present

## 2014-10-29 DIAGNOSIS — E43 Unspecified severe protein-calorie malnutrition: Secondary | ICD-10-CM | POA: Diagnosis present

## 2014-10-29 DIAGNOSIS — Z7982 Long term (current) use of aspirin: Secondary | ICD-10-CM

## 2014-10-29 DIAGNOSIS — Z888 Allergy status to other drugs, medicaments and biological substances status: Secondary | ICD-10-CM

## 2014-10-29 DIAGNOSIS — I214 Non-ST elevation (NSTEMI) myocardial infarction: Secondary | ICD-10-CM | POA: Diagnosis present

## 2014-10-29 DIAGNOSIS — F411 Generalized anxiety disorder: Secondary | ICD-10-CM | POA: Diagnosis present

## 2014-10-29 DIAGNOSIS — Z882 Allergy status to sulfonamides status: Secondary | ICD-10-CM

## 2014-10-29 DIAGNOSIS — F419 Anxiety disorder, unspecified: Secondary | ICD-10-CM | POA: Diagnosis present

## 2014-10-29 DIAGNOSIS — Z881 Allergy status to other antibiotic agents status: Secondary | ICD-10-CM

## 2014-10-29 DIAGNOSIS — Z9049 Acquired absence of other specified parts of digestive tract: Secondary | ICD-10-CM | POA: Diagnosis present

## 2014-10-29 DIAGNOSIS — Z8249 Family history of ischemic heart disease and other diseases of the circulatory system: Secondary | ICD-10-CM | POA: Diagnosis not present

## 2014-10-29 DIAGNOSIS — Z681 Body mass index (BMI) 19 or less, adult: Secondary | ICD-10-CM

## 2014-10-29 DIAGNOSIS — T380X5A Adverse effect of glucocorticoids and synthetic analogues, initial encounter: Secondary | ICD-10-CM | POA: Diagnosis present

## 2014-10-29 DIAGNOSIS — Z79818 Long term (current) use of other agents affecting estrogen receptors and estrogen levels: Secondary | ICD-10-CM | POA: Diagnosis not present

## 2014-10-29 DIAGNOSIS — Z79899 Other long term (current) drug therapy: Secondary | ICD-10-CM | POA: Diagnosis not present

## 2014-10-29 DIAGNOSIS — J449 Chronic obstructive pulmonary disease, unspecified: Secondary | ICD-10-CM | POA: Diagnosis present

## 2014-10-29 DIAGNOSIS — Z811 Family history of alcohol abuse and dependence: Secondary | ICD-10-CM

## 2014-10-29 DIAGNOSIS — Z808 Family history of malignant neoplasm of other organs or systems: Secondary | ICD-10-CM

## 2014-10-29 DIAGNOSIS — Z886 Allergy status to analgesic agent status: Secondary | ICD-10-CM

## 2014-10-29 DIAGNOSIS — F29 Unspecified psychosis not due to a substance or known physiological condition: Secondary | ICD-10-CM | POA: Diagnosis present

## 2014-10-29 DIAGNOSIS — Z7951 Long term (current) use of inhaled steroids: Secondary | ICD-10-CM

## 2014-10-29 DIAGNOSIS — I2510 Atherosclerotic heart disease of native coronary artery without angina pectoris: Secondary | ICD-10-CM | POA: Diagnosis present

## 2014-10-29 DIAGNOSIS — F339 Major depressive disorder, recurrent, unspecified: Secondary | ICD-10-CM | POA: Diagnosis present

## 2014-10-29 DIAGNOSIS — Z801 Family history of malignant neoplasm of trachea, bronchus and lung: Secondary | ICD-10-CM

## 2014-10-29 DIAGNOSIS — R079 Chest pain, unspecified: Secondary | ICD-10-CM

## 2014-10-29 DIAGNOSIS — F061 Catatonic disorder due to known physiological condition: Secondary | ICD-10-CM | POA: Diagnosis present

## 2014-10-29 DIAGNOSIS — F332 Major depressive disorder, recurrent severe without psychotic features: Secondary | ICD-10-CM | POA: Diagnosis present

## 2014-10-29 LAB — BASIC METABOLIC PANEL
ANION GAP: 8 (ref 5–15)
BUN: 42 mg/dL — ABNORMAL HIGH (ref 6–20)
CO2: 28 mmol/L (ref 22–32)
CREATININE: 0.54 mg/dL (ref 0.44–1.00)
Calcium: 8.7 mg/dL — ABNORMAL LOW (ref 8.9–10.3)
Chloride: 104 mmol/L (ref 101–111)
GFR calc Af Amer: >60 mL/min (ref >60)
GFR calc non Af Amer: >60 mL/min (ref >60)
Glucose, Bld: 116 mg/dL — ABNORMAL HIGH (ref 65–99)
Potassium: 4.4 mmol/L (ref 3.5–5.1)
SODIUM: 140 mmol/L (ref 135–145)

## 2014-10-29 LAB — CBC WITH DIFFERENTIAL/PLATELET
BAND NEUTROPHILS: 0 % (ref 0–10)
BLASTS: 0 %
Basophils Absolute: 0 10*3/uL (ref 0.0–0.1)
Basophils Relative: 0 % (ref 0–1)
EOS ABS: 0.2 10*3/uL (ref 0.0–0.7)
EOS PCT: 2 % (ref 0–5)
HCT: 41.1 % (ref 35.0–47.0)
Hemoglobin: 13.5 g/dL (ref 12.0–16.0)
LYMPHS ABS: 2.1 10*3/uL (ref 0.7–4.0)
Lymphocytes Relative: 18 % (ref 12–46)
MCH: 31.1 pg (ref 26.0–34.0)
MCHC: 32.9 g/dL (ref 32.0–36.0)
MCV: 94.5 fL (ref 80.0–100.0)
METAMYELOCYTES PCT: 4 %
MONOS PCT: 8 % (ref 3–12)
MYELOCYTES: 0 %
Monocytes Absolute: 0.9 10*3/uL (ref 0.1–1.0)
Neutro Abs: 8.2 10*3/uL — ABNORMAL HIGH (ref 1.7–7.7)
Neutrophils Relative %: 68 % (ref 43–77)
PLATELETS: 318 10*3/uL (ref 150–440)
Promyelocytes Absolute: 0 %
RBC: 4.35 MIL/uL (ref 3.80–5.20)
RDW: 13.7 % (ref 11.5–14.5)
WBC: 11.4 10*3/uL — ABNORMAL HIGH (ref 3.6–11.0)
nRBC: 0 /100 WBC

## 2014-10-29 LAB — CBC
HCT: 40.5 % (ref 35.0–47.0)
Hemoglobin: 13.7 g/dL (ref 12.0–16.0)
MCH: 32.3 pg (ref 26.0–34.0)
MCHC: 33.7 g/dL (ref 32.0–36.0)
MCV: 95.6 fL (ref 80.0–100.0)
PLATELETS: 300 10*3/uL (ref 150–440)
RBC: 4.24 MIL/uL (ref 3.80–5.20)
RDW: 13.4 % (ref 11.5–14.5)
WBC: 13.3 10*3/uL — AB (ref 3.6–11.0)

## 2014-10-29 LAB — TROPONIN I
TROPONIN I: 1.48 ng/mL — AB (ref <0.031)
Troponin I: 1.55 ng/mL — ABNORMAL HIGH (ref <0.031)
Troponin I: 1.93 ng/mL — ABNORMAL HIGH (ref <0.031)

## 2014-10-29 LAB — C-REACTIVE PROTEIN

## 2014-10-29 SURGERY — LEFT HEART CATH AND CORONARY ANGIOGRAPHY
Anesthesia: Moderate Sedation

## 2014-10-29 MED ORDER — NITROGLYCERIN 0.4 MG SL SUBL: 0.4000 mg | SUBLINGUAL_TABLET | SUBLINGUAL | Status: DC | PRN

## 2014-10-29 MED ORDER — ONDANSETRON HCL 4 MG PO TABS: 4.0000 mg | ORAL_TABLET | Freq: Four times a day (QID) | ORAL | Status: DC | PRN

## 2014-10-29 MED ORDER — ACETAMINOPHEN 325 MG PO TABS: 650.0000 mg | ORAL_TABLET | Freq: Four times a day (QID) | ORAL | Status: DC | PRN

## 2014-10-29 MED ORDER — ACETAMINOPHEN 650 MG RE SUPP: 650.0000 mg | Freq: Four times a day (QID) | RECTAL | Status: DC | PRN

## 2014-10-29 MED ORDER — ENOXAPARIN SODIUM 40 MG/0.4ML ~~LOC~~ SOLN
1.0000 mg/kg | Freq: Two times a day (BID) | SUBCUTANEOUS | Status: DC
  Filled 2014-10-29 (×2): qty 0.4

## 2014-10-29 MED ORDER — ENOXAPARIN SODIUM 40 MG/0.4ML ~~LOC~~ SOLN
1.0000 mg/kg | Freq: Two times a day (BID) | SUBCUTANEOUS | Status: DC
  Administered 2014-10-29 – 2014-11-01 (×5): 40 mg via SUBCUTANEOUS
  Filled 2014-10-29 (×5): qty 0.4

## 2014-10-29 MED ORDER — ONDANSETRON HCL 4 MG/2ML IJ SOLN: 4.0000 mg | Freq: Four times a day (QID) | INTRAMUSCULAR | Status: DC | PRN

## 2014-10-29 MED ORDER — DOCUSATE SODIUM 100 MG PO CAPS
200.0000 mg | ORAL_CAPSULE | Freq: Two times a day (BID) | ORAL | Status: DC
  Administered 2014-10-29 – 2014-10-31 (×5): 200 mg via ORAL
  Filled 2014-10-29 (×6): qty 2

## 2014-10-29 MED ORDER — PANTOPRAZOLE SODIUM 40 MG PO TBEC
40.0000 mg | DELAYED_RELEASE_TABLET | Freq: Every day | ORAL | Status: DC
Start: 2014-10-29 — End: 2014-11-01
  Administered 2014-10-29 – 2014-11-01 (×4): 40 mg via ORAL
  Filled 2014-10-29 (×4): qty 1

## 2014-10-29 MED ORDER — CHOLECALCIFEROL 400 UNIT/ML PO LIQD
400.0000 [IU] | Freq: Every day | ORAL | Status: DC
  Administered 2014-10-29 – 2014-11-01 (×4): 400 [IU] via ORAL
  Filled 2014-10-29 (×4): qty 1

## 2014-10-29 MED ORDER — MEGESTROL ACETATE 400 MG/10ML PO SUSP
800.0000 mg | Freq: Every day | ORAL | Status: DC
  Administered 2014-10-29 – 2014-11-01 (×4): 800 mg via ORAL
  Filled 2014-10-29 (×4): qty 20

## 2014-10-29 MED ORDER — MIRTAZAPINE 30 MG PO TABS
45.0000 mg | ORAL_TABLET | Freq: Every day | ORAL | Status: DC
  Administered 2014-10-29 – 2014-10-31 (×3): 45 mg via ORAL
  Filled 2014-10-29 (×3): qty 1

## 2014-10-29 MED ORDER — CHLORHEXIDINE GLUCONATE 0.12 % MT SOLN
15.0000 mL | Freq: Two times a day (BID) | OROMUCOSAL | Status: DC
  Administered 2014-10-29 – 2014-11-01 (×3): 15 mL via OROMUCOSAL

## 2014-10-29 MED ORDER — ASPIRIN EC 81 MG PO TBEC
81.0000 mg | DELAYED_RELEASE_TABLET | Freq: Every day | ORAL | Status: DC
  Administered 2014-10-29 – 2014-11-01 (×4): 81 mg via ORAL
  Filled 2014-10-29 (×4): qty 1

## 2014-10-29 MED ORDER — ADULT MULTIVITAMIN W/MINERALS CH
1.0000 | ORAL_TABLET | Freq: Every day | ORAL | Status: DC
  Administered 2014-10-29 – 2014-10-30 (×2): 1 via ORAL
  Filled 2014-10-29 (×3): qty 1

## 2014-10-29 MED ORDER — CLOZAPINE 25 MG PO TABS
25.0000 mg | ORAL_TABLET | Freq: Every day | ORAL | Status: DC
  Administered 2014-10-29 – 2014-10-31 (×3): 25 mg via ORAL
  Filled 2014-10-29 (×4): qty 1

## 2014-10-29 MED ORDER — IPRATROPIUM-ALBUTEROL 0.5-2.5 (3) MG/3ML IN SOLN: 3.0000 mL | RESPIRATORY_TRACT | Status: DC | PRN

## 2014-10-29 MED ORDER — FLUVOXAMINE MALEATE 50 MG PO TABS
50.0000 mg | ORAL_TABLET | Freq: Every day | ORAL | Status: DC
  Administered 2014-10-29 – 2014-10-31 (×3): 50 mg via ORAL
  Filled 2014-10-29 (×4): qty 1

## 2014-10-29 MED ORDER — ASPIRIN 81 MG PO CHEW
324.0000 mg | CHEWABLE_TABLET | Freq: Once | ORAL | Status: AC
  Administered 2014-10-29: 324 mg via ORAL
  Filled 2014-10-29: qty 4

## 2014-10-29 MED ORDER — SODIUM CHLORIDE 0.9 % IJ SOLN
3.0000 mL | Freq: Two times a day (BID) | INTRAMUSCULAR | Status: DC
  Administered 2014-10-29 (×2): 3 mL via INTRAVENOUS

## 2014-10-29 MED ORDER — SENNA 8.6 MG PO TABS: 2.0000 | ORAL_TABLET | Freq: Every day | ORAL | Status: DC | PRN

## 2014-10-29 MED ORDER — ENSURE ENLIVE PO LIQD
237.0000 mL | Freq: Three times a day (TID) | ORAL | Status: DC
  Administered 2014-10-29 – 2014-11-01 (×6): 237 mL via ORAL

## 2014-10-29 MED ORDER — METOPROLOL TARTRATE 25 MG PO TABS
12.5000 mg | ORAL_TABLET | Freq: Two times a day (BID) | ORAL | Status: DC
  Administered 2014-10-29 – 2014-11-01 (×5): 12.5 mg via ORAL
  Filled 2014-10-29 (×7): qty 1

## 2014-10-29 MED ORDER — ALPRAZOLAM 0.25 MG PO TABS
0.2500 mg | ORAL_TABLET | Freq: Three times a day (TID) | ORAL | Status: DC
  Administered 2014-10-29 – 2014-11-01 (×11): 0.25 mg via ORAL
  Filled 2014-10-29 (×11): qty 1

## 2014-10-29 MED ORDER — CETYLPYRIDINIUM CHLORIDE 0.05 % MT LIQD
7.0000 mL | Freq: Two times a day (BID) | OROMUCOSAL | Status: DC
  Administered 2014-10-29 – 2014-11-01 (×4): 7 mL via OROMUCOSAL

## 2014-10-29 MED ORDER — SODIUM CHLORIDE 0.9 % IJ SOLN: 3.0000 mL | INTRAMUSCULAR | Status: DC | PRN

## 2014-10-29 NOTE — Care Management (Signed)
It appears that patient was admitted to 2A from behavorial med unit for chest pain.  

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
## 2014-10-29 NOTE — Progress Notes (Signed)
Spoke with Dr Jimmye Norman concerning patient's elevated troponin level.  Patient will be transferred to telemetry.

## 2014-10-29 NOTE — Progress Notes (Signed)
Patient ID: Melissa George, female   DOB: [birth year], 64 y.o.   MRN: [id number] Pt remains on [facility].  They requested additional labs yesterday and consult with hopsitalist.  These were ordered.  Pt has been discharged from [facility] to Medical Floor, 2a overnight.  I did not see that anyone had contacted her family.  LVM for son Melissa George, [phone].  Also called and spoke with her son Melissa George [phone].  Notified him of Pt transfer which appeared to be due to complaints of chest pain and  Elevated triponin levels.  SW provided 2aSW name and contact number to provide further updates.    Notified 2a, LCSW Tilford Pillar of Pt's history here on unit, goal of increasing clozaril, history of [facility] referral and that she is presently on list.  SW notified her that family is not aware of this referral as they were given the option if she should be transferred there in past and will not be able to make that decision again.  Pt had planned to live with her friend Melissa George at discharge as no other options are available at this time. She can be reached at [phone] (home), [phone] (cell)  10/29/2014 9:34 AM Dossie Arbour, MSW, LCSW

## 2014-10-29 NOTE — Progress Notes (Signed)
MEDICATION RELATED CONSULT NOTE - INITIAL   Pharmacy Consult for Clozapine  Indication: Clozapine monitoring   Allergies  Allergen Reactions  . Ciprofloxacin Shortness Of Breath and Itching  . Levofloxacin Other (See Comments)    Reaction:  Unknown   . Morphine And Related Nausea And Vomiting  . Sulfa Antibiotics Hives and Itching  . Symbicort [Budesonide-Formoterol Fumarate] Other (See Comments)    Reaction:  Psychotic episode  . Advair Diskus [Fluticasone-Salmeterol] Anxiety    Patient Measurements: Weight: 88 lb 11.2 oz (40.234 kg) Adjusted Body Weight:   Vital Signs: Temp: 98.4 F (36.9 C) (07/20 1127) Temp Source: Oral (07/20 1127) BP: 106/58 mmHg (07/20 1127) Pulse Rate: 92 (07/20 1127) Intake/Output from previous day: 07/19 0701 - 07/20 0700 In: -  Out: 400 [Urine:400] Intake/Output from this shift:    Labs:  Recent Labs  10/28/14 1639 10/29/14 0431 10/29/14 1233  WBC 13.4* 13.3* 11.4*  HGB 13.9 13.7 13.5  HCT 41.9 40.5 41.1  PLT 306 300 318  CREATININE 0.50 0.54  --    Estimated Creatinine Clearance: 44.5 mL/min (by C-G formula based on Cr of 0.54).   Microbiology: No results found for this or any previous visit (from the past 720 hour(s)).  Medical History: Past Medical History  Diagnosis Date  . Anxiety   . COPD (chronic obstructive pulmonary disease)   . Anxiety   . 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 ALPRAZolam  0.25 mg Oral TID  . antiseptic oral rinse  7 mL Mouth Rinse q12n4p  . aspirin EC  81 mg Oral Daily  . chlorhexidine  15 mL Mouth Rinse BID  . cholecalciferol  400 Units Oral Daily  . cloZAPine  25 mg Oral QHS  . docusate sodium  200 mg Oral BID  . enoxaparin (LOVENOX) injection  1 mg/kg Subcutaneous Q12H  . feeding supplement (ENSURE ENLIVE)  237 mL Oral TID  . fluvoxaMINE  50 mg Oral QHS  . megestrol  800 mg Oral Daily  . metoprolol tartrate  12.5 mg Oral BID  . mirtazapine  45 mg Oral QHS  .  multivitamin with minerals  1 tablet Oral Daily  . pantoprazole  40 mg Oral Daily  . sodium chloride  3 mL Intravenous Q12H    Assessment: Patient chronically on clozapine   ANC= 8200. Submitted labs to registry   Next lab check 7/27 . Jerlene Rockers D 10/29/2014,2:12 PM

## 2014-10-29 NOTE — H&P (Signed)
[facility] at [place] NAME: Melissa George    MR#:  [id number]  DATE OF BIRTH:  1951-01-15   DATE OF ADMISSION:  10/29/2014  PRIMARY CARE PHYSICIAN: No PCP Per Patient   REQUESTING/REFERRING PHYSICIAN: Psychiatry-behavior health unit  CHIEF COMPLAINT:  Chest pain  HISTORY OF PRESENT ILLNESS:  Melissa George  is a 64 y.o. female with a known history of COPD, coronary artery disease, major depressive disorder who was originally admitted approximately one month ago to the behavioral health unit for issues with depression. Her condition has been somewhat improving. however, she complained of an episode of chest pain, acute onset, retrosternal location, radiation to left arm, 10/10 intensity, pressure in quality without relieving or worsening factors. This episode lasted approximately 1 hour in total duration she was evaluated by the hospitalist service at that time who ordered EKG as well as troponins. Upon trending troponins noted that an upward trend. She denies any further symptomatology. Discussed the case with the on-call psychiatrist and recommended transfer to receive more medical appropriate treatment.  PAST MEDICAL HISTORY:   Past Medical History  Diagnosis Date  . Anxiety   . COPD (chronic obstructive pulmonary disease)   . Anxiety   . 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 Hand surgery    . Appendectomy    . Abdominal hysterectomy    . Cesarean section      SOCIAL HISTORY:   History  Substance Use Topics  . Smoking status: Former Research scientist (life sciences)  . Smokeless tobacco: Not on file  . Alcohol Use: No    FAMILY HISTORY:   Family History  Problem Relation Age of Onset  . CAD Mother   . Thyroid cancer Other   . Lung cancer Other     DRUG ALLERGIES:   Allergies  Allergen Reactions  . Ciprofloxacin Shortness Of Breath and Itching  .  Levofloxacin Other (See Comments)    Reaction:  Unknown   . Morphine And Related Nausea And Vomiting  . Sulfa Antibiotics Hives and Itching  . Symbicort [Budesonide-Formoterol Fumarate] Other (See Comments)    Reaction:  Psychotic episode  . Advair Diskus [Fluticasone-Salmeterol] Anxiety    REVIEW OF SYSTEMS:  REVIEW OF SYSTEMS:  CONSTITUTIONAL: Denies fevers, chills, positive fatigue, weakness.  EYES: Denies blurred vision, double vision, or eye pain.  EARS, NOSE, THROAT: Denies tinnitus, ear pain, hearing loss.  RESPIRATORY: denies cough, positive shortness of breath, denies wheezing  CARDIOVASCULAR: Denies current chest pain, palpitations, edema.  GASTROINTESTINAL: Denies nausea, vomiting, diarrhea, abdominal pain.  GENITOURINARY: Denies dysuria, hematuria.  ENDOCRINE: Denies nocturia or thyroid problems. HEMATOLOGIC AND LYMPHATIC: Denies easy bruising or bleeding.  SKIN: Denies rash or lesions.  MUSCULOSKELETAL: Denies pain in neck, back, shoulder, knees, hips, or further arthritic symptoms.  NEUROLOGIC: Denies paralysis, paresthesias.  PSYCHIATRIC: Denies anxiety or positive depressive symptoms. Denies homicidal/suicidal ideation Otherwise full review of systems performed by me is negative.   MEDICATIONS AT HOME:   Prior to Admission medications   Medication Sig Start Date End Date Taking? Authorizing Provider  ALPRAZolam (XANAX) 0.25 MG tablet Take 1 tablet (0.25 mg total) by mouth 3 (three) times daily with meals. 10/24/14   Hildred Priest, MD  cholecalciferol (D-VI-SOL) 400 UNIT/ML LIQD Take 1 mL (400 Units total) by mouth daily with breakfast. 10/24/14   Hildred Priest, MD  cloZAPine (CLOZARIL) 25 MG tablet Take  1 tablet (25 mg total) by mouth at bedtime. 10/24/14   Hildred Priest, MD  docusate sodium (COLACE) 100 MG capsule Take 2 capsules (200 mg total) by mouth 2 (two) times daily. 10/24/14   Hildred Priest, MD  feeding  supplement, ENSURE ENLIVE, (ENSURE ENLIVE) LIQD Take 237 mLs by mouth 4 (four) times daily. 10/15/14   Hildred Priest, MD  fluvoxaMINE (LUVOX) 50 MG tablet Take 1 tablet (50 mg total) by mouth at bedtime. 10/24/14   Hildred Priest, MD  ipratropium-albuterol (DUONEB) 0.5-2.5 (3) MG/3ML SOLN Take 3 mLs by nebulization every 6 (six) hours as needed. 09/29/14   Hildred Priest, MD  magnesium citrate SOLN Take 296 mLs (1 Bottle total) by mouth 2 (two) times a week. 10/24/14   Hildred Priest, MD  megestrol (MEGACE) 400 MG/10ML suspension Take 20 mLs (800 mg total) by mouth at bedtime. 09/26/14   Hildred Priest, MD  mirtazapine (REMERON) 45 MG tablet Take 1 tablet (45 mg total) by mouth at bedtime. 09/26/14   Hildred Priest, MD  Multiple Vitamin (MULTIVITAMIN WITH MINERALS) TABS tablet Take 1 tablet by mouth daily with breakfast. 10/15/14   Hildred Priest, MD  senna (SENOKOT) 8.6 MG TABS tablet Take 2 tablets (17.2 mg total) by mouth daily. 09/26/14   Hildred Priest, MD  tiotropium (SPIRIVA) 18 MCG inhalation capsule Place 18 mcg into inhaler and inhale daily.    Historical Provider, MD      VITAL SIGNS:  Blood pressure 125/84, pulse 82, temperature 98.3 F (36.8 C), temperature source Oral, resp. rate 17, weight 88 lb 11.2 oz (40.234 kg), SpO2 98 %.  PHYSICAL EXAMINATION:  VITAL SIGNS: Filed Vitals:   10/29/14 0331  BP: 125/84  Pulse: 82  Temp: 98.3 F (36.8 C)  Resp: 76   GENERAL:63 y.o.female currently in no acute distress. Weak appearing HEAD: Normocephalic, atraumatic.  EYES: Pupils equal, round, reactive to light. Extraocular muscles intact. No scleral icterus.  MOUTH: Moist mucosal membrane. Dentition intact. No abscess noted.  EAR, NOSE, THROAT: Clear without exudates. No external lesions.  NECK: Supple. No thyromegaly. No nodules. No JVD.  PULMONARY: Clear to ascultation, without wheeze rails or rhonci. No  use of accessory muscles, Good respiratory effort. good air entry bilaterally CHEST: Nontender to palpation.  CARDIOVASCULAR: S1 and S2. Regular rate and rhythm. No murmurs, rubs, or gallops. No edema. Pedal pulses 2+ bilaterally.  GASTROINTESTINAL: Soft, nontender, nondistended. No masses. Positive bowel sounds. No hepatosplenomegaly.  MUSCULOSKELETAL: No swelling, clubbing, or edema. Range of motion full in all extremities.  NEUROLOGIC: Cranial nerves II through XII are intact. No gross focal neurological deficits. Sensation intact. Reflexes intact.  SKIN: No ulceration, lesions, rashes, or cyanosis. Skin warm and dry. Turgor intact.  PSYCHIATRIC: Mood, affect blunted. The patient is awake, alert and oriented x 3. Insight, judgment intact.    LABORATORY PANEL:   CBC  Recent Labs Lab 10/28/14 1639  WBC 13.4*  HGB 13.9  HCT 41.9  PLT 306   ------------------------------------------------------------------------------------------------------------------  Chemistries   Recent Labs Lab 10/23/14 1808 10/28/14 1639  NA 141 140  K 3.6 4.5  CL 100* 104  CO2 28 27  GLUCOSE 206* 131*  BUN 42* 38*  CREATININE 0.67 0.50  CALCIUM 8.9 9.0  AST 33  --   ALT 47  --   ALKPHOS 43  --   BILITOT 0.3  --    ------------------------------------------------------------------------------------------------------------------  Cardiac Enzymes  Recent Labs Lab 10/28/14 2248  TROPONINI 0.28*   ------------------------------------------------------------------------------------------------------------------  RADIOLOGY:  No  results found.  EKG:   Orders placed or performed during the hospital encounter of 09/13/14  . EKG 12-Lead  . EKG 12-Lead  . EKG 12-Lead  . EKG 12-Lead  . EKG 12-Lead  . EKG 12-Lead  . ED EKG  . ED EKG    IMPRESSION AND PLAN:   64 year old Caucasian female history of COPD, coronary artery disease presenting with chest pain.  1. NSTEMI: Initiate aspirin  statin therapy, place on telemetry, trend cardiac enzymes, consult cardiology, therapeutic Lovenox 2. COPD, unspecified: Not acute exacerbation, continue supplemental oxygen as required, DuoNeb treatments as required 3. Constipation initiate bowel regiment 4. Major depressive disorder: Consult psychiatry for continuation of care 5. Venous thrombosis prophylactic: Therapeutic Lovenox    All the records are reviewed and case discussed with ED provider. Management plans discussed with the patient, family and they are in agreement.  CODE STATUS: Full  TOTAL TIME TAKING CARE OF THIS PATIENT: 35 minutes.    Gianah Batt,  Karenann Cai.D on 10/29/2014 at 3:45 AM  Between 7am to 6pm - Pager - [phone]  After 6pm: House Pager: - [phone]  [facility]  Office  [phone]  CC: Primary care physician; No PCP Per Patient

## 2014-10-29 NOTE — Progress Notes (Signed)
Report called to [employer], RN on 2A.

## 2014-10-29 NOTE — Progress Notes (Signed)
D: Patient denies SI/HI/AVH. Patient affect and mood are depressed  Patient did NOT attend evening group. Patient stayed in her room stating she did not have the strength to get up.  Patient was given a bed bath by her 1:1 sitter.  Patient denies any chest pain.  A: Support and encouragement offered. Scheduled medications given to pt. Q 15 min checks continued for patient safety. R: Patient receptive. Patient remains safe on the unit.

## 2014-10-29 NOTE — Progress Notes (Signed)
Initial Nutrition Assessment  DOCUMENTATION CODES:   Severe malnutrition in context of chronic illness  INTERVENTION:   Meals and snacks: Cater to pt preferences Nutritional Supplement Therapy: Recommend Ensure Enlive po TID, each supplement provides 350 kcal and 20 grams of protein   NUTRITION DIAGNOSIS:   Inadequate oral intake related to chronic illness as evidenced by severe depletion of body fat, severe depletion of muscle mass.    GOAL:   Patient will meet greater than or equal to 90% of their needs    MONITOR:    (Energy intake, Anthropometrics)  REASON FOR ASSESSMENT:   Consult Assessment of nutrition requirement/status  ASSESSMENT:   Pt admitted with NSTEMI  Past Medical History  Diagnosis Date  . Anxiety   . COPD (chronic obstructive pulmonary disease)   . Anxiety   . 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     Current Nutrition: ate few bites of mashed potatoes for lunch today, nothing else, tray observed  Food/Nutrition-Related History: Pt not giving much information to this writer regarding intake prior to admission Reports has not been drinking ensure as outpatient  Medications: colace, remeron, megace  Electrolyte/Renal Profile and Glucose Profile:   Recent Labs Lab 10/23/14 1808 10/28/14 1639 10/29/14 0431  NA 141 140 140  K 3.6 4.5 4.4  CL 100* 104 104  CO2 '28 27 28  '$ BUN 42* 38* 42*  CREATININE 0.67 0.50 0.54  CALCIUM 8.9 9.0 8.7*  GLUCOSE 206* 131* 116*   Protein Profile:  Recent Labs Lab 10/23/14 1808  ALBUMIN 3.0*     Last BM:7/19   Nutrition-Focused Physical Exam Findings: Nutrition-Focused physical exam completed. Findings are severe fat depletion, severe muscle depletion, and no edema.      Weight Change: Noted wt gain recently from past wt encounters.  Pt reports wt gain as well.     Diet Order:  Diet Heart Room service appropriate?: Yes; Fluid consistency:: Thin  Skin:  Reviewed, no issues   Height:   Ht  Readings from Last 1 Encounters:  09/13/14 '5\' 1"'$  (1.549 m)    Weight:   Wt Readings from Last 1 Encounters:  10/29/14 88 lb 11.2 oz (40.234 kg)    Ideal Body Weight:   105 pounds  Wt Readings from Last 10 Encounters:  10/29/14 88 lb 11.2 oz (40.234 kg)  10/25/14 89 lb (40.37 kg)  09/11/14 77 lb (34.927 kg)  07/10/14 75 lb 8 oz (34.247 kg)  03/11/14 82 lb 5 oz (37.337 kg)    BMI:  Body mass index is 16.77 kg/(m^2).  Estimated Nutritional Needs:   Kcal:  BEE 882 kcals (IF 1.0-1.2, AF 1.3) 9417-4081 kcals/d.   Protein:  (1.0-1.2 g/kg) 40-48 g/d  Fluid:  (30-41m/kg) 1200-14070md  EDUCATION NEEDS:   No education needs identified at this time  HIGH Care Level Marlyne Totaro B. AlZenia ResidesRDCragsmoorLDWinnebagopager)

## 2014-10-29 NOTE — Progress Notes (Signed)
Patient  Discharge to medical floor no information faxed

## 2014-10-29 NOTE — Progress Notes (Signed)
[facility] at [place] NAME: Melissa George    MR#:  [id number]  DATE OF BIRTH:  03/17/1951  SUBJECTIVE:  CHIEF COMPLAINT:  No chief complaint on file.  Fatigue, chest pain is improved, no shortness of breath  REVIEW OF SYSTEMS:   Review of Systems  Constitutional: Positive for weight loss and malaise/fatigue. Negative for fever.  Respiratory: Negative for shortness of breath.   Cardiovascular: Positive for chest pain and palpitations.  Gastrointestinal: Negative for nausea, vomiting and abdominal pain.  Genitourinary: Negative for dysuria.  Neurological: Positive for weakness.    DRUG ALLERGIES:   Allergies  Allergen Reactions  . Ciprofloxacin Shortness Of Breath and Itching  . Levofloxacin Other (See Comments)    Reaction:  Unknown   . Morphine And Related Nausea And Vomiting  . Sulfa Antibiotics Hives and Itching  . Symbicort [Budesonide-Formoterol Fumarate] Other (See Comments)    Reaction:  Psychotic episode  . Advair Diskus [Fluticasone-Salmeterol] Anxiety    VITALS:  Blood pressure 106/58, pulse 92, temperature 98.4 F (36.9 C), temperature source Oral, resp. rate 18, weight 40.234 kg (88 lb 11.2 oz), SpO2 96 %.  PHYSICAL EXAMINATION:  GENERAL:  64 y.o.-year-old patient lying in the bed, fatigued, thin EYES: Pupils equal, round, reactive to light and accommodation. No scleral icterus. Extraocular muscles intact.  HEENT: Head atraumatic, normocephalic. Oropharynx and nasopharynx clear. MMdry NECK:  Supple, no jugular venous distention. No thyroid enlargement, no tenderness.  LUNGS: Normal breath sounds bilaterally, no wheezing, rales, rhonchi or crepitation. No use of accessory muscles of respiration.  CARDIOVASCULAR: S1, S2 normal. No murmurs, rubs, or gallops.  ABDOMEN: Soft, nontender, nondistended. Bowel sounds present. No organomegaly or mass.  EXTREMITIES: No pedal edema, cyanosis, or clubbing.  NEUROLOGIC:  Cranial nerves II through XII are intact. Muscle strength 5/5 in all extremities. Sensation intact. PSYCHIATRIC: The patient is groggy and oriented x 3.  SKIN: No obvious rash, lesion, or ulcer.    LABORATORY PANEL:   CBC  Recent Labs Lab 10/29/14 0431  WBC 13.3*  HGB 13.7  HCT 40.5  PLT 300   ------------------------------------------------------------------------------------------------------------------  Chemistries   Recent Labs Lab 10/23/14 1808  10/29/14 0431  NA 141  < > 140  K 3.6  < > 4.4  CL 100*  < > 104  CO2 28  < > 28  GLUCOSE 206*  < > 116*  BUN 42*  < > 42*  CREATININE 0.67  < > 0.54  CALCIUM 8.9  < > 8.7*  AST 33  --   --   ALT 47  --   --   ALKPHOS 43  --   --   BILITOT 0.3  --   --   < > = values in this interval not displayed. ------------------------------------------------------------------------------------------------------------------  Cardiac Enzymes  Recent Labs Lab 10/29/14 1043  TROPONINI 1.93*   ------------------------------------------------------------------------------------------------------------------  RADIOLOGY:  No results found.  EKG:   Orders placed or performed during the hospital encounter of 09/13/14  . EKG 12-Lead  . EKG 12-Lead  . EKG 12-Lead  . EKG 12-Lead  . EKG 12-Lead  . EKG 12-Lead  . ED EKG  . ED EKG    ASSESSMENT AND PLAN:   1) NSTEMI:  0.28> 1.55> 1.93 - chest pain improved - appreciate cardiology consultation and plan for cath - continue therapeutic lovenox, start BB  2) COPD: no exacerbation, Duonebs as needed  3) depression: Per psychiatry. Once medically cleared she will be  transferred back to the inpatient psychiatric unit. The family has requested that the social worker from psychiatry contact them regarding disposition planning.  CODE STATUS: Full  TOTAL TIME TAKING CARE OF THIS PATIENT: 38 minutes.  Greater than 50% of time spent in care coordination and counseling.All the  records are reviewed and case discussed with Care Management/Social Worker. Management plans discussed with the patient, family and they are in agreement. I have called and spoke with her son Fredonia Highland who will communicate with her other son [name]. I have also spoken with Dr. Weber Cooks.  Contact numbers: Fredonia Highland [phone] [name] [phone]  POSSIBLE D/C IN 2 DAYS, DEPENDING ON CLINICAL CONDITION.   Myrtis Ser M.D on 10/29/2014 at 1:07 PM  Between 7am to 6pm - Pager - [phone]  After 6pm go to [email or web address] - password [email or web address] [facility]  [facility]  Office  [phone]  CC: Primary care physician; No PCP Per Patient

## 2014-10-30 LAB — CBC
HCT: 39.2 % (ref 35.0–47.0)
HEMOGLOBIN: 13.3 g/dL (ref 12.0–16.0)
MCH: 32.2 pg (ref 26.0–34.0)
MCHC: 33.9 g/dL (ref 32.0–36.0)
MCV: 94.9 fL (ref 80.0–100.0)
Platelets: 303 10*3/uL (ref 150–440)
RBC: 4.13 MIL/uL (ref 3.80–5.20)
RDW: 13.1 % (ref 11.5–14.5)
WBC: 12 10*3/uL — ABNORMAL HIGH (ref 3.6–11.0)

## 2014-10-30 LAB — HEPARIN LEVEL (UNFRACTIONATED): HEPARIN UNFRACTIONATED: 0.71 [IU]/mL — AB (ref 0.30–0.70)

## 2014-10-30 LAB — BASIC METABOLIC PANEL
ANION GAP: 6 (ref 5–15)
BUN: 30 mg/dL — AB (ref 6–20)
CO2: 28 mmol/L (ref 22–32)
Calcium: 8.4 mg/dL — ABNORMAL LOW (ref 8.9–10.3)
Chloride: 106 mmol/L (ref 101–111)
Creatinine, Ser: 0.61 mg/dL (ref 0.44–1.00)
GFR calc Af Amer: >60 mL/min (ref >60)
GFR calc non Af Amer: >60 mL/min (ref >60)
Glucose, Bld: 103 mg/dL — ABNORMAL HIGH (ref 65–99)
Potassium: 4 mmol/L (ref 3.5–5.1)
Sodium: 140 mmol/L (ref 135–145)

## 2014-10-30 NOTE — Progress Notes (Signed)
[place] [facility] at [place] NAME: Melissa George    MR#:  [id number]  DATE OF BIRTH:  [birth year]  SUBJECTIVE:  CHIEF COMPLAINT:  No chief complaint on file.  No chest pain this morning, still very short of breath with activity. Tired.    REVIEW OF SYSTEMS:   Review of Systems  Constitutional: Positive for weight loss and malaise/fatigue. Negative for fever.  Respiratory: Negative for shortness of breath.   Cardiovascular: Positive for chest pain and palpitations.  Gastrointestinal: Negative for nausea, vomiting and abdominal pain.  Genitourinary: Negative for dysuria.  Neurological: Positive for weakness.    DRUG ALLERGIES:   Allergies  Allergen Reactions  . Ciprofloxacin Shortness Of Breath and Itching  . Levofloxacin Other (See Comments)    Reaction:  Unknown   . Morphine And Related Nausea And Vomiting  . Sulfa Antibiotics Hives and Itching  . Symbicort [Budesonide-Formoterol Fumarate] Other (See Comments)    Reaction:  Psychotic episode  . Advair Diskus [Fluticasone-Salmeterol] Anxiety    VITALS:  Blood pressure 112/58, pulse 87, temperature 98.3 F (36.8 C), temperature source Oral, resp. rate 18, height '5\' 1"'$  (1.549 m), weight 41.413 kg (91 lb 4.8 oz), SpO2 96 %.  PHYSICAL EXAMINATION:  GENERAL:  64 y.o.-year-old patient lying in the bed, fatigued, thin EYES: Pupils equal, round, reactive to light and accommodation. No scleral icterus. Extraocular muscles intact.  HEENT: Head atraumatic, normocephalic. Oropharynx and nasopharynx clear. MMdry NECK:  Supple, no jugular venous distention. No thyroid enlargement, no tenderness.  LUNGS: Normal breath sounds bilaterally, no wheezing, rales, rhonchi or crepitation. No use of accessory muscles of respiration.  CARDIOVASCULAR: S1, S2 normal. No murmurs, rubs, or gallops.  ABDOMEN: Soft, nontender, nondistended. Bowel sounds present. No organomegaly or mass.  EXTREMITIES: No  pedal edema, cyanosis, or clubbing.  NEUROLOGIC: Cranial nerves II through XII are intact. Muscle strength 5/5 in all extremities. Sensation intact. PSYCHIATRIC: The patient is groggy and oriented x 3.  SKIN: No obvious rash, lesion, or ulcer.    LABORATORY PANEL:   CBC  Recent Labs Lab 10/30/14 0446  WBC 12.0*  HGB 13.3  HCT 39.2  PLT 303   ------------------------------------------------------------------------------------------------------------------  Chemistries   Recent Labs Lab 10/23/14 1808  10/30/14 0446  NA 141  < > 140  K 3.6  < > 4.0  CL 100*  < > 106  CO2 28  < > 28  GLUCOSE 206*  < > 103*  BUN 42*  < > 30*  CREATININE 0.67  < > 0.61  CALCIUM 8.9  < > 8.4*  AST 33  --   --   ALT 47  --   --   ALKPHOS 43  --   --   BILITOT 0.3  --   --   < > = values in this interval not displayed. ------------------------------------------------------------------------------------------------------------------  Cardiac Enzymes  Recent Labs Lab 10/29/14 1546  TROPONINI 1.48*   ------------------------------------------------------------------------------------------------------------------  RADIOLOGY:  No results found.  EKG:   Orders placed or performed during the hospital encounter of 09/13/14  . EKG 12-Lead  . EKG 12-Lead  . EKG 12-Lead  . EKG 12-Lead  . EKG 12-Lead  . EKG 12-Lead  . ED EKG  . ED EKG  . EKG    ASSESSMENT AND PLAN:   1) NSTEMI:  0.28> 1.55> 1.93 - chest pain improved - continue therapeutic lovenox, start BB - She has declined cardiac catheterization this morning. I have discussed this with  radiology. I've also discussed it with the patient and she is reconsidering. We'll check back with her this afternoon and hopefully plan for catheter in the morning.  2) COPD: no exacerbation, Duonebs as needed  3) depression: Per psychiatry. Once medically cleared she will be transferred back to the inpatient psychiatric unit.    CODE  STATUS: Full  TOTAL TIME TAKING CARE OF THIS PATIENT: 38 minutes.  Greater than 50% of time spent in care coordination and counseling.All the records are reviewed and case discussed with Care Management/Social Worker. Management plans discussed with the patient. Plan is for family meeting this afternoon. Contact numbers: Fredonia Highland [phone] [name] [phone]  POSSIBLE D/C IN 2 DAYS, DEPENDING ON CLINICAL CONDITION.   Myrtis Ser M.D on 10/30/2014 at 1:37 PM  Between 7am to 6pm - Pager - [phone]  After 6pm go to [email or web address] - password [email or web address] [facility]  [facility] [place] [facility]  Office  [phone]  CC: Primary care physician; No PCP Per Patient

## 2014-10-30 NOTE — Plan of Care (Signed)
Problem: Phase I Progression Outcomes Goal: Other Phase I Outcomes/Goals Outcome: Completed/Met Date Met:  10/30/14 Pt is alert and oriented x 4, denies pain, denies chest pain, withdrawn with flat affect, on 2 L oxygen, fair appetite, remains IVC with sitter at bedside, pt is anxious, recieves scheduled xanax, WBC up slightly, vital signs stable, son at bedside, pt is considering the possibility of cath in the am, pt will be NPO after mn in preparation for the possibility.

## 2014-10-30 NOTE — Clinical Social Work Note (Addendum)
CSW was updated by team lead about pt's current care.  Plan is still to keep pt on [facility] waiting list as she is still under IVC.  However pt will continue to be assessed for medical stability.  Currently refusing cath.  SNF placement may be necessary.  If IVC is discontinued then she may be well enough to DC home where she has a house mate that will provide 24hr care.  CSW will continue to follow.  Contacts Son [name] Cherlynn [phone]
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
## 2014-10-30 NOTE — Progress Notes (Signed)
[facility] Encounter Note  Patient: Melissa George / Admit Date: 10/29/2014 / Date of Encounter: 10/30/2014, 8:27 AM   Subjective: No chest pain or shortness of breath. Patient hemodynamically stable  Review of Systems: Positive for: None Negative for: Vision change, hearing change, syncope, dizziness, nausea, vomiting,diarrhea, bloody stool, stomach pain, cough, congestion, diaphoresis, urinary frequency, urinary pain,skin lesions, skin rashes Others previously listed  Objective: Telemetry: Normal sinus rhythm Physical Exam: Blood pressure 115/69, pulse 81, temperature 99.2 F (37.3 C), temperature source Oral, resp. rate 20, height '5\' 1"'$  (1.549 m), weight 91 lb 4.8 oz (41.413 kg), SpO2 99 %. Body mass index is 17.26 kg/(m^2). General: Well developed, well nourished, in no acute distress. Head: Normocephalic, atraumatic, sclera non-icteric, no xanthomas, nares are without discharge. Neck: No apparent masses Lungs: Normal respirations with no wheezes, no rhonchi, no rales , few crackles   Heart: Regular rate and rhythm, normal S1 S2, no murmur, no rub, no gallop, PMI is normal size and placement, carotid upstroke normal without bruit, jugular venous pressure normal Abdomen: Soft, non-tender, non-distended with normoactive bowel sounds. No hepatosplenomegaly. Abdominal aorta is normal size without bruit Extremities: No edema, no clubbing, no cyanosis, no ulcers,  Peripheral: 2+ radial, 2+ femoral, 2+ dorsal pedal pulses Neuro: Alert and oriented. Moves all extremities spontaneously. Psych:  Responds to questions appropriately with a normal affect.   Intake/Output Summary (Last 24 hours) at 10/30/14 0827 Last data filed at 10/30/14 0554  Gross per 24 hour  Intake      0 ml  Output   1100 ml  Net  -1100 ml    Inpatient Medications:  . ALPRAZolam  0.25 mg Oral TID  . antiseptic oral rinse  7 mL Mouth Rinse q12n4p  . aspirin EC  81 mg Oral Daily  .  chlorhexidine  15 mL Mouth Rinse BID  . cholecalciferol  400 Units Oral Daily  . cloZAPine  25 mg Oral QHS  . docusate sodium  200 mg Oral BID  . enoxaparin (LOVENOX) injection  1 mg/kg Subcutaneous Q12H  . feeding supplement (ENSURE ENLIVE)  237 mL Oral TID  . fluvoxaMINE  50 mg Oral QHS  . megestrol  800 mg Oral Daily  . metoprolol tartrate  12.5 mg Oral BID  . mirtazapine  45 mg Oral QHS  . multivitamin with minerals  1 tablet Oral Daily  . pantoprazole  40 mg Oral Daily   Infusions:    Labs:  Recent Labs  10/29/14 0431 10/30/14 0446  NA 140 140  K 4.4 4.0  CL 104 106  CO2 28 28  GLUCOSE 116* 103*  BUN 42* 30*  CREATININE 0.54 0.61  CALCIUM 8.7* 8.4*   No results for input(s): AST, ALT, ALKPHOS, BILITOT, PROT, ALBUMIN in the last 72 hours.  Recent Labs  10/29/14 1233 10/30/14 0446  WBC 11.4* 12.0*  NEUTROABS 8.2*  --   HGB 13.5 13.3  HCT 41.1 39.2  MCV 94.5 94.9  PLT 318 303    Recent Labs  10/28/14 2248 10/29/14 0431 10/29/14 1043 10/29/14 1546  TROPONINI 0.28* 1.55* 1.93* 1.48*   Invalid input(s): POCBNP No results for input(s): HGBA1C in the last 72 hours.   Weights: Filed Weights   10/29/14 0331 10/30/14 0345  Weight: 88 lb 11.2 oz (40.234 kg) 91 lb 4.8 oz (41.413 kg)     Radiology/Studies:  Ct Chest Wo Contrast  10/02/2014   CLINICAL DATA:  Follow-up chest radiograph, lung nodule  EXAM: CT  CHEST WITHOUT CONTRAST  TECHNIQUE: Multidetector CT imaging of the chest was performed following the standard protocol without IV contrast.  COMPARISON:  Chest radiographs dated 09/28/2014, 01/21/2014, and 06/22/2009. CT chest dated 01/22/2014.  FINDINGS: Mediastinum/Nodes: The heart is normal in size. No pericardial effusion.  No mediastinal lymphadenopathy.  Visualized thyroid is unremarkable  Lungs/Pleura: 12 x 7 mm calcified nodule in the lateral right upper lobe (series 3/image 15), unchanged since 2011, likely reflecting benign nodular scarring related  to prior granulomatous disease.  Moderate to severe centrilobular and paraseptal emphysematous changes.  No focal consolidation.  No pleural effusion or pneumothorax.  Upper abdomen: Visualized upper abdomen is grossly unremarkable.  Musculoskeletal: Mild degenerative changes of the visualized thoracolumbar spine.  IMPRESSION: 12 x 7 mm calcified nodule in the lateral right upper lobe, unchanged since 2011, benign.  No evidence of acute cardiopulmonary disease.  Underlying moderate to severe emphysematous changes.   Electronically Signed   By: Julian Hy M.D.   On: 10/02/2014 12:32     Assessment and Recommendation  64 y.o. female with known chronic obstructive pulmonary disease with a non-ST elevation myocardial infarction without further symptoms at this time. Patient was recommended for cardiac catheterization but wishes not to pursue cardiac catheterization therefore medication management which would be appropriate 1. Continue aspirin and Plavix 75 mg each day for further risk reduction cardiovascular event 2. Beta blocker and ACE inhibitor if able depending on heart rate and blood pressure control and potential side effects 3. Cardiac rehabilitation has been recommended and discussed 4. Shin and follow for further any significant symptoms and further adjustments of medications and her further intervention 5. Call if her further questions  Signed, Serafina Royals M.D. FACC

## 2014-10-31 ENCOUNTER — Encounter: Payer: Self-pay | Admitting: Cardiology

## 2014-10-31 ENCOUNTER — Inpatient Hospital Stay
Admit: 2014-10-31 | Discharge: 2014-10-31 | Disposition: A | Discharge status: Home / Self Care | Payer: Medicare Other | Attending: Internal Medicine | Admitting: Internal Medicine

## 2014-10-31 ENCOUNTER — Inpatient Hospital Stay: Admit: 2014-10-31 | Payer: Medicare Other | Admitting: Internal Medicine

## 2014-10-31 ENCOUNTER — Encounter: Disposition: A | Discharge status: Other Acute Inpatient Hospital | Payer: Self-pay | Attending: Internal Medicine

## 2014-10-31 HISTORY — PX: CARDIAC CATHETERIZATION: SHX172

## 2014-10-31 LAB — CBC
HCT: 40.1 % (ref 35.0–47.0)
Hemoglobin: 13.6 g/dL (ref 12.0–16.0)
MCH: 31.9 pg (ref 26.0–34.0)
MCHC: 33.9 g/dL (ref 32.0–36.0)
MCV: 94.2 fL (ref 80.0–100.0)
PLATELETS: 295 10*3/uL (ref 150–440)
RBC: 4.26 MIL/uL (ref 3.80–5.20)
RDW: 13.2 % (ref 11.5–14.5)
WBC: 12.9 10*3/uL — ABNORMAL HIGH (ref 3.6–11.0)

## 2014-10-31 LAB — BASIC METABOLIC PANEL
Anion gap: 7 (ref 5–15)
BUN: 23 mg/dL — AB (ref 6–20)
CALCIUM: 8.7 mg/dL — AB (ref 8.9–10.3)
CO2: 27 mmol/L (ref 22–32)
CREATININE: 0.57 mg/dL (ref 0.44–1.00)
Chloride: 106 mmol/L (ref 101–111)
GFR calc Af Amer: >60 mL/min (ref >60)
Glucose, Bld: 104 mg/dL — ABNORMAL HIGH (ref 65–99)
POTASSIUM: 4.2 mmol/L (ref 3.5–5.1)
SODIUM: 140 mmol/L (ref 135–145)

## 2014-10-31 LAB — TROPONIN I: TROPONIN I: 0.64 ng/mL — AB (ref <0.031)

## 2014-10-31 SURGERY — LEFT HEART CATH AND CORONARY ANGIOGRAPHY
Anesthesia: Moderate Sedation

## 2014-10-31 MED ORDER — SODIUM CHLORIDE 0.9 % WEIGHT BASED INFUSION: 1.0000 mL/kg/h | INTRAVENOUS | Status: DC

## 2014-10-31 MED ORDER — IOHEXOL 300 MG/ML  SOLN
INTRAMUSCULAR | Status: DC | PRN
  Administered 2014-10-31: 30 mL via INTRA_ARTERIAL
  Administered 2014-10-31: 100 mL via INTRA_ARTERIAL
  Administered 2014-10-31: 60 mL via INTRA_ARTERIAL

## 2014-10-31 MED ORDER — SODIUM CHLORIDE 0.9 % IJ SOLN
3.0000 mL | Freq: Two times a day (BID) | INTRAMUSCULAR | Status: DC
  Administered 2014-10-31: 3 mL via INTRAVENOUS

## 2014-10-31 MED ORDER — HEPARIN (PORCINE) IN NACL 2-0.9 UNIT/ML-% IJ SOLN
INTRAMUSCULAR | Status: AC
  Filled 2014-10-31: qty 1000

## 2014-10-31 MED ORDER — SODIUM CHLORIDE 0.9 % IV SOLN
INTRAVENOUS | Status: DC | PRN
  Administered 2014-10-31: 200 mL/h via INTRAVENOUS

## 2014-10-31 MED ORDER — SODIUM CHLORIDE 0.9 % WEIGHT BASED INFUSION: 3.0000 mL/kg/h | INTRAVENOUS | Status: AC

## 2014-10-31 MED ORDER — SODIUM CHLORIDE 0.9 % IJ SOLN: 3.0000 mL | INTRAMUSCULAR | Status: DC | PRN

## 2014-10-31 MED ORDER — HALOPERIDOL LACTATE 5 MG/ML IJ SOLN
5.0000 mg | Freq: Once | INTRAMUSCULAR | Status: AC
  Administered 2014-10-31: 5 mg via INTRAVENOUS
  Filled 2014-10-31: qty 1

## 2014-10-31 MED ORDER — FENTANYL CITRATE (PF) 100 MCG/2ML IJ SOLN
INTRAMUSCULAR | Status: DC | PRN
  Administered 2014-10-31: 25 ug via INTRAVENOUS

## 2014-10-31 MED ORDER — MIDAZOLAM HCL 2 MG/2ML IJ SOLN
INTRAMUSCULAR | Status: DC | PRN
  Administered 2014-10-31: 1 mg via INTRAVENOUS

## 2014-10-31 MED ORDER — SODIUM CHLORIDE 0.9 % WEIGHT BASED INFUSION
3.0000 mL/kg/h | INTRAVENOUS | Status: AC
  Administered 2014-10-31: 3 mL/kg/h via INTRAVENOUS

## 2014-10-31 MED ORDER — ASPIRIN 81 MG PO CHEW: 81.0000 mg | CHEWABLE_TABLET | ORAL | Status: DC

## 2014-10-31 MED ORDER — SODIUM CHLORIDE 0.9 % IV SOLN: 250.0000 mL | INTRAVENOUS | Status: DC | PRN

## 2014-10-31 MED ORDER — ASPIRIN 81 MG PO CHEW
81.0000 mg | CHEWABLE_TABLET | ORAL | Status: DC
Start: 2014-11-01 — End: 2014-11-01

## 2014-10-31 MED ORDER — FENTANYL CITRATE (PF) 100 MCG/2ML IJ SOLN
INTRAMUSCULAR | Status: AC
  Filled 2014-10-31: qty 2

## 2014-10-31 MED ORDER — MIDAZOLAM HCL 2 MG/2ML IJ SOLN
INTRAMUSCULAR | Status: AC
  Filled 2014-10-31: qty 2

## 2014-10-31 SURGICAL SUPPLY — 8 items
CATH INFINITI 5FR ANG PIGTAIL (CATHETERS) ×2 IMPLANT
CATH INFINITI 5FR JL4 (CATHETERS) ×2 IMPLANT
CATH INFINITI JR4 5F (CATHETERS) ×2 IMPLANT
KIT MANI 3VAL PERCEP (MISCELLANEOUS) ×2 IMPLANT
NEEDLE PERC 18GX7CM (NEEDLE) ×2 IMPLANT
PACK CARDIAC CATH (CUSTOM PROCEDURE TRAY) ×2 IMPLANT
SHEATH AVANTI 5FR X 11CM (SHEATH) ×2 IMPLANT
WIRE EMERALD 3MM-J .035X150CM (WIRE) ×2 IMPLANT

## 2014-10-31 NOTE — Progress Notes (Signed)
Nutrition Follow-up  DOCUMENTATION CODES:   Severe malnutrition in context of chronic illness  INTERVENTION:   Medical Food Supplement Therapy: Continue Ensure Enlive po TID, each supplement provides 350 kcal and 20 grams of protein   NUTRITION DIAGNOSIS:   Inadequate oral intake related to chronic illness as evidenced by severe depletion of body fat, severe depletion of muscle mass.   GOAL:   Patient will meet greater than or equal to 90% of their needs   MONITOR:    (Energy intake, Anthropometrics)   ASSESSMENT:   Pt down for cardiac cath on visit today; per [place] RN, pt is alert, flat affect  Diet Order:  Diet NPO time specified   Energy Intake:  Recorded po intake 43% of meals; NPO today for procedure; pt is drinking some of the Ensure per documentation  Skin:  Reviewed, no issues  Electrolyte and Renal Profile:  Recent Labs Lab 10/29/14 0431 10/30/14 0446 10/31/14 0438  BUN 42* 30* 23*  CREATININE 0.54 0.61 0.57  NA 140 140 140  K 4.4 4.0 4.2   Glucose Profile: No results for input(s): GLUCAP in the last 72 hours. Protein Profile: No results for input(s): ALBUMIN in the last 168 hours. .la  Height:   Ht Readings from Last 1 Encounters:  10/29/14 '5\' 1"'$  (1.549 m)    Weight:   Wt Readings from Last 1 Encounters:  10/31/14 98 lb 7 oz (44.651 kg)    BMI:  Body mass index is 18.61 kg/(m^2).  Estimated Nutritional Needs:   Kcal:  BEE 882 kcals (IF 1.0-1.2, AF 1.3) 2993-7169 kcals/d.   Protein:  (1.0-1.2 g/kg) 40-48 g/d  Fluid:  (30-48m/kg) 1200-14045md  EDUCATION NEEDS:   No education needs identified at this time  MOStoningtonRD, LDN ([phone]

## 2014-10-31 NOTE — Care Management (Signed)
Cardiac cath did not reveal any findings that will require intervention.  Anticipate patient to discharge back down to beh med

## 2014-10-31 NOTE — Progress Notes (Signed)
10/31/14 1600  Clinical Encounter Type  Visited With Patient and family together  Visit Type Initial;Other (Comment) (AD Education)  Referral From Social work  Consult/Referral To Chaplain  Recommendations Chaplain will complete AD 7/23 with notary.  Spiritual Encounters  Spiritual Needs Literature;Emotional  Stress Factors  Patient Stress Factors Exhausted;Family relationships;Health changes;Major life changes  Family Stress Factors Family relationships;Health changes;Major life changes  Met w/patient & family. Patient was lucid and capable of explaining purpose of health power of attorney and expressing personal desire to designate sons as the same. No notary was available to complete the POA. Patient & family was informed the forms will be completed & notarized in the morning of 11/01/14. Chap.  G. , ext. [phone] 

## 2014-10-31 NOTE — Care Management Important Message (Signed)
Important Message  Patient Details  Name: Melissa George MRN: [id number] Date of Birth: 07-Dec-1950   Medicare Important Message Given:  Yes-second notification given    Juliann Pulse A Allmond 10/31/2014, 11:08 AM

## 2014-10-31 NOTE — Progress Notes (Signed)
*  PRELIMINARY RESULTS* Echocardiogram 2D Echocardiogram has been performed.  Lavell Luster Stills 10/31/2014, 5:59 PM
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
## 2014-10-31 NOTE — Clinical Social Work Note (Signed)
Clinical Social Work Assessment  Patient Details  Name: Melissa George MRN: [id number] Date of Birth: [birth year]  Date of referral:  10/31/14               Reason for consult:  Other (Comment Required), Facility Placement (pt was transfered from BMU for cath placement.  May need SNF placement after procedure.)                Permission sought to share information with:  Family Supports, Psychiatrist (pt is currently under IVC) Permission granted to share information::  Yes, Verbal Permission Granted (pt was able to give verbal concent to CSW to speak to her sons)  Name::        Agency::     Relationship::     Contact Information:     Housing/Transportation Living arrangements for the past 2 months:  Single Family Home (pt has been in BMU at hospital ) Source of Information:  Patient, Medical Team, Adult Children Patient Interpreter Needed:  None Criminal Activity/Legal Involvement Pertinent to Current Situation/Hospitalization:  No - Comment as needed Significant Relationships:  Adult Children Lives with:  Self Do you feel safe going back to the place where you live?  No Need for family participation in patient care:  Yes (Comment)  Care giving concerns:  Current care giver concerns are obtaining an HCPOA while pt's son [name] is here.   Social Worker assessment / plan:  CSW spoke to pt.  She was able to answer orientations, however still seems confused about her medical situation.  CSW spoke to pt's son [name] and he expressed that he wanted to be HCPOA for the pt.  Pt nodded her head in agreement with her son's request.  CSW also attempted to assess what her home was like.  She stated that it was a one story home and she lived alone.  When asked if she was able to take her medication by herself at home she stated yes.  Per the pt's son, pt's friend will be living with her once she returns home.  CSW also discussed HCPOA with pt's son and what that means in terms of making decisions  about her health care plan.  Pt's son verbalized that he understood this and pt nodded her head to indicate that she understood as well.  CSW is currently trying to contact pastoral services for [facility] paperwork.  Pt's son it only here until the end of today.    Employment status:  Disabled (Comment on whether or not currently receiving Disability) Insurance information:  Medicare PT Recommendations:    Information / Referral to community resources:     Patient/Family's Response to care: pt's son stated that he would prefer pt not to go to [facility], however he understands that due to pt's IVC status the may not be another option for a facility that can accomodate her treatment.   Patient/Family's Understanding of and Emotional Response to Diagnosis, Current Treatment, and Prognosis: pt verbalized understanding.  Emotional Assessment Appearance:  Appears stated age Attitude/Demeanor/Rapport:  Avoidant, Guarded Affect (typically observed):   (it was difficult to assess pt's affect as she would know give 1 to 2 word responses.) Orientation:  Oriented to Self, Oriented to Place, Oriented to  Time Alcohol / Substance use:    Psych involvement (Current and /or in the community):  Yes (Comment)  Discharge Needs  Concerns to be addressed:  Care Coordination Readmission within the last 30 days:  No Current discharge risk:  Psychiatric Illness Barriers to Discharge:  Requiring sitter/restraints (pt is currently on IVC)   Mathews Argyle, LCSW 10/31/2014, 11:55 AM

## 2014-10-31 NOTE — Progress Notes (Signed)
[facility] NAME: Melissa George    MR#:  [id number]  DATE OF BIRTH:  12-Dec-1950  SUBJECTIVE:  CHIEF COMPLAINT:  No chief complaint on file.  No specific complaints. No chest pain or shortness of breath. Thin, flat affect does not want to talk much   REVIEW OF SYSTEMS:   Review of Systems  Constitutional: Positive for weight loss and malaise/fatigue. Negative for fever.  Respiratory: Negative for shortness of breath.   Cardiovascular: Positive for chest pain and palpitations.  Gastrointestinal: Negative for nausea, vomiting and abdominal pain.  Genitourinary: Negative for dysuria.  Neurological: Positive for weakness.    DRUG ALLERGIES:   Allergies  Allergen Reactions  . Ciprofloxacin Shortness Of Breath and Itching  . Levofloxacin Other (See Comments)    Reaction:  Unknown   . Morphine And Related Nausea And Vomiting  . Sulfa Antibiotics Hives and Itching  . Symbicort [Budesonide-Formoterol Fumarate] Other (See Comments)    Reaction:  Psychotic episode  . Advair Diskus [Fluticasone-Salmeterol] Anxiety    VITALS:  Blood pressure 103/63, pulse 80, temperature 98.4 F (36.9 C), temperature source Oral, resp. rate 17, height '5\' 1"'  (1.549 m), weight 44.651 kg (98 lb 7 oz), SpO2 98 %.  PHYSICAL EXAMINATION:  GENERAL:  64 y.o.-year-old patient lying in the bed, fatigued, thin EYES: Pupils equal, round, reactive to light and accommodation. No scleral icterus. Extraocular muscles intact.  HEENT: Head atraumatic, normocephalic. Oropharynx and nasopharynx clear. MMdry NECK:  Supple, no jugular venous distention. No thyroid enlargement, no tenderness.  LUNGS: Normal breath sounds bilaterally, no wheezing, rales, rhonchi or crepitation. No use of accessory muscles of respiration.  CARDIOVASCULAR: S1, S2 normal. No murmurs, rubs, or gallops.  ABDOMEN: Soft, nontender, nondistended. Bowel sounds present. No organomegaly or  mass.  EXTREMITIES: No pedal edema, cyanosis, or clubbing.  NEUROLOGIC: Cranial nerves II through XII are intact. Muscle strength 5/5 in all extremities. Sensation intact. PSYCHIATRIC: The patient is groggy and oriented x 3.  SKIN: No obvious rash, lesion, or ulcer.    LABORATORY PANEL:   CBC  Recent Labs Lab 10/31/14 0438  WBC 12.9*  HGB 13.6  HCT 40.1  PLT 295   ------------------------------------------------------------------------------------------------------------------  Chemistries   Recent Labs Lab 10/31/14 0438  NA 140  K 4.2  CL 106  CO2 27  GLUCOSE 104*  BUN 23*  CREATININE 0.57  CALCIUM 8.7*   ------------------------------------------------------------------------------------------------------------------  Cardiac Enzymes  Recent Labs Lab 10/31/14 0438  TROPONINI 0.64*   ------------------------------------------------------------------------------------------------------------------  RADIOLOGY:  No results found.  EKG:   Orders placed or performed during the hospital encounter of 09/13/14  . EKG 12-Lead  . EKG 12-Lead  . EKG 12-Lead  . EKG 12-Lead  . EKG 12-Lead  . EKG 12-Lead  . ED EKG  . ED EKG  . EKG    ASSESSMENT AND PLAN:   1) NSTEMI:  0.28> 1.55> 1.93 - chest pain improved at this time. She was treated with therapeutic Lovenox prior to catheterization. She'll continue on aspirin and metoprolol. She had cardiac catheterization today showing 20% stenosis at the mid LAD. No comments on ejection fraction. We'll get a 2-D echocardiogram. Further recommendations per cardiology.  2) COPD: no exacerbation, Duonebs as needed  3) depression: Per psychiatry. Once medically cleared she will be transferred back to the inpatient psychiatric unit. I have met with her sons [name] and [facility] Highland on 7/21. They are against transfer to state mental health facility. They would like  her to be stabilized at this BHU if needed and then discharged to home.  Defer to psychiatry service for long-term disposition.  #4 deconditioning: We'll get physical therapy consultation  CODE STATUS: Full  TOTAL TIME TAKING CARE OF THIS PATIENT: 38 minutes.  Greater than 50% of time spent in care coordination and counseling.All the records are reviewed and case discussed with Care Management/Social Worker. Management plans discussed with the patient. Plan is for family meeting this afternoon. Contact numbers: [facility] [phone] [name] [phone]  POSSIBLE D/C IN 2 DAYS, DEPENDING ON CLINICAL CONDITION.   Myrtis Ser M.D on 10/31/2014 at 3:57 PM  Between 7am to 6pm - Pager - [phone]  After 6pm go to [email or web address] - password [email or web address] [facility]  [facility]  Office  [phone]  CC: Primary care physician; No PCP Per Patient

## 2014-11-01 ENCOUNTER — Encounter: Payer: Self-pay | Admitting: General Practice

## 2014-11-01 ENCOUNTER — Inpatient Hospital Stay
Admission: AD | Admit: 2014-11-01 | Discharge: 2014-11-05 | DRG: 885 | Disposition: A | Discharge status: Home / Self Care | Payer: Medicare Other | Source: Ambulatory Visit | Attending: Psychiatry | Admitting: Psychiatry

## 2014-11-01 DIAGNOSIS — J449 Chronic obstructive pulmonary disease, unspecified: Secondary | ICD-10-CM | POA: Diagnosis present

## 2014-11-01 DIAGNOSIS — F22 Delusional disorders: Secondary | ICD-10-CM | POA: Diagnosis present

## 2014-11-01 DIAGNOSIS — Z811 Family history of alcohol abuse and dependence: Secondary | ICD-10-CM | POA: Diagnosis not present

## 2014-11-01 DIAGNOSIS — K5900 Constipation, unspecified: Secondary | ICD-10-CM | POA: Diagnosis present

## 2014-11-01 DIAGNOSIS — Z90710 Acquired absence of both cervix and uterus: Secondary | ICD-10-CM

## 2014-11-01 DIAGNOSIS — Z9889 Other specified postprocedural states: Secondary | ICD-10-CM | POA: Diagnosis not present

## 2014-11-01 DIAGNOSIS — F333 Major depressive disorder, recurrent, severe with psychotic symptoms: Secondary | ICD-10-CM | POA: Diagnosis not present

## 2014-11-01 DIAGNOSIS — Z9049 Acquired absence of other specified parts of digestive tract: Secondary | ICD-10-CM | POA: Diagnosis present

## 2014-11-01 DIAGNOSIS — F411 Generalized anxiety disorder: Secondary | ICD-10-CM | POA: Diagnosis present

## 2014-11-01 DIAGNOSIS — R079 Chest pain, unspecified: Secondary | ICD-10-CM | POA: Diagnosis present

## 2014-11-01 DIAGNOSIS — Z87891 Personal history of nicotine dependence: Secondary | ICD-10-CM | POA: Diagnosis not present

## 2014-11-01 DIAGNOSIS — I252 Old myocardial infarction: Secondary | ICD-10-CM | POA: Diagnosis present

## 2014-11-01 DIAGNOSIS — E785 Hyperlipidemia, unspecified: Secondary | ICD-10-CM | POA: Diagnosis present

## 2014-11-01 DIAGNOSIS — K219 Gastro-esophageal reflux disease without esophagitis: Secondary | ICD-10-CM | POA: Diagnosis present

## 2014-11-01 DIAGNOSIS — Z882 Allergy status to sulfonamides status: Secondary | ICD-10-CM | POA: Diagnosis not present

## 2014-11-01 DIAGNOSIS — Z801 Family history of malignant neoplasm of trachea, bronchus and lung: Secondary | ICD-10-CM

## 2014-11-01 DIAGNOSIS — R627 Adult failure to thrive: Secondary | ICD-10-CM | POA: Diagnosis present

## 2014-11-01 DIAGNOSIS — Z7982 Long term (current) use of aspirin: Secondary | ICD-10-CM

## 2014-11-01 DIAGNOSIS — F332 Major depressive disorder, recurrent severe without psychotic features: Secondary | ICD-10-CM

## 2014-11-01 DIAGNOSIS — Z8249 Family history of ischemic heart disease and other diseases of the circulatory system: Secondary | ICD-10-CM | POA: Diagnosis not present

## 2014-11-01 DIAGNOSIS — F1310 Sedative, hypnotic or anxiolytic abuse, uncomplicated: Secondary | ICD-10-CM | POA: Diagnosis not present

## 2014-11-01 DIAGNOSIS — Z008 Encounter for other general examination: Secondary | ICD-10-CM | POA: Diagnosis present

## 2014-11-01 DIAGNOSIS — Z79899 Other long term (current) drug therapy: Secondary | ICD-10-CM

## 2014-11-01 DIAGNOSIS — G4700 Insomnia, unspecified: Secondary | ICD-10-CM | POA: Diagnosis present

## 2014-11-01 DIAGNOSIS — Z888 Allergy status to other drugs, medicaments and biological substances status: Secondary | ICD-10-CM | POA: Diagnosis not present

## 2014-11-01 DIAGNOSIS — F061 Catatonic disorder due to known physiological condition: Secondary | ICD-10-CM | POA: Diagnosis present

## 2014-11-01 DIAGNOSIS — F329 Major depressive disorder, single episode, unspecified: Secondary | ICD-10-CM | POA: Diagnosis not present

## 2014-11-01 DIAGNOSIS — E43 Unspecified severe protein-calorie malnutrition: Secondary | ICD-10-CM | POA: Diagnosis present

## 2014-11-01 DIAGNOSIS — Z808 Family history of malignant neoplasm of other organs or systems: Secondary | ICD-10-CM | POA: Diagnosis not present

## 2014-11-01 DIAGNOSIS — I214 Non-ST elevation (NSTEMI) myocardial infarction: Secondary | ICD-10-CM | POA: Diagnosis not present

## 2014-11-01 LAB — BASIC METABOLIC PANEL
ANION GAP: 7 (ref 5–15)
BUN: 24 mg/dL — ABNORMAL HIGH (ref 6–20)
CALCIUM: 8.5 mg/dL — AB (ref 8.9–10.3)
CHLORIDE: 106 mmol/L (ref 101–111)
CO2: 27 mmol/L (ref 22–32)
Creatinine, Ser: 0.59 mg/dL (ref 0.44–1.00)
GFR calc Af Amer: >60 mL/min (ref >60)
GFR calc non Af Amer: >60 mL/min (ref >60)
Glucose, Bld: 126 mg/dL — ABNORMAL HIGH (ref 65–99)
Potassium: 3.9 mmol/L (ref 3.5–5.1)
Sodium: 140 mmol/L (ref 135–145)

## 2014-11-01 LAB — CBC
HEMATOCRIT: 38.7 % (ref 35.0–47.0)
Hemoglobin: 12.9 g/dL (ref 12.0–16.0)
MCH: 31.7 pg (ref 26.0–34.0)
MCHC: 33.4 g/dL (ref 32.0–36.0)
MCV: 94.8 fL (ref 80.0–100.0)
Platelets: 283 10*3/uL (ref 150–440)
RBC: 4.08 MIL/uL (ref 3.80–5.20)
RDW: 13.6 % (ref 11.5–14.5)
WBC: 14.4 10*3/uL — ABNORMAL HIGH (ref 3.6–11.0)

## 2014-11-01 LAB — LIPID PANEL
CHOLESTEROL: 127 mg/dL (ref 0–200)
HDL: 36 mg/dL — ABNORMAL LOW (ref >40)
LDL CALC: 77 mg/dL (ref 0–99)
TRIGLYCERIDES: 71 mg/dL (ref <150)
Total CHOL/HDL Ratio: 3.5 RATIO
VLDL: 14 mg/dL (ref 0–40)

## 2014-11-01 MED ORDER — METOPROLOL TARTRATE 25 MG PO TABS
12.5000 mg | ORAL_TABLET | Freq: Two times a day (BID) | ORAL | Status: DC
  Administered 2014-11-01 – 2014-11-05 (×7): 12.5 mg via ORAL
  Filled 2014-11-01 (×8): qty 1

## 2014-11-01 MED ORDER — IPRATROPIUM-ALBUTEROL 0.5-2.5 (3) MG/3ML IN SOLN
3.0000 mL | RESPIRATORY_TRACT | Status: DC | PRN
  Administered 2014-11-02: 3 mL via RESPIRATORY_TRACT
  Filled 2014-11-01: qty 3

## 2014-11-01 MED ORDER — ALPRAZOLAM 0.25 MG PO TABS
0.2500 mg | ORAL_TABLET | Freq: Three times a day (TID) | ORAL | Status: DC
  Administered 2014-11-01 – 2014-11-05 (×12): 0.25 mg via ORAL
  Filled 2014-11-01 (×12): qty 1

## 2014-11-01 MED ORDER — CHOLECALCIFEROL 400 UNIT/ML PO LIQD: 400.0000 [IU] | Freq: Every day | ORAL | Status: DC

## 2014-11-01 MED ORDER — NITROGLYCERIN 0.4 MG SL SUBL: 0.4000 mg | SUBLINGUAL_TABLET | SUBLINGUAL | Status: DC | PRN

## 2014-11-01 MED ORDER — METOPROLOL TARTRATE 25 MG PO TABS: 12.5000 mg | ORAL_TABLET | Freq: Two times a day (BID) | ORAL | Status: DC

## 2014-11-01 MED ORDER — CHLORHEXIDINE GLUCONATE 0.12 % MT SOLN
15.0000 mL | Freq: Two times a day (BID) | OROMUCOSAL | Status: DC
  Administered 2014-11-04 – 2014-11-05 (×3): 15 mL via OROMUCOSAL

## 2014-11-01 MED ORDER — MAGNESIUM HYDROXIDE 400 MG/5ML PO SUSP: 30.0000 mL | Freq: Every day | ORAL | Status: DC | PRN

## 2014-11-01 MED ORDER — ACETAMINOPHEN 650 MG RE SUPP
650.0000 mg | Freq: Four times a day (QID) | RECTAL | Status: DC | PRN
  Filled 2014-11-01: qty 1

## 2014-11-01 MED ORDER — ONDANSETRON HCL 4 MG PO TABS: 4.0000 mg | ORAL_TABLET | Freq: Four times a day (QID) | ORAL | Status: DC | PRN

## 2014-11-01 MED ORDER — ACETAMINOPHEN 325 MG PO TABS
650.0000 mg | ORAL_TABLET | Freq: Four times a day (QID) | ORAL | Status: DC | PRN
  Administered 2014-11-02 – 2014-11-03 (×2): 650 mg via ORAL

## 2014-11-01 MED ORDER — ASPIRIN EC 81 MG PO TBEC
81.0000 mg | DELAYED_RELEASE_TABLET | Freq: Every day | ORAL | Status: DC
  Administered 2014-11-02 – 2014-11-05 (×4): 81 mg via ORAL
  Filled 2014-11-01 (×4): qty 1

## 2014-11-01 MED ORDER — DOCUSATE SODIUM 100 MG PO CAPS
200.0000 mg | ORAL_CAPSULE | Freq: Two times a day (BID) | ORAL | Status: DC
  Administered 2014-11-01 – 2014-11-05 (×8): 200 mg via ORAL
  Filled 2014-11-01 (×8): qty 2

## 2014-11-01 MED ORDER — SENNA 8.6 MG PO TABS: 2.0000 | ORAL_TABLET | Freq: Every day | ORAL | Status: DC | PRN

## 2014-11-01 MED ORDER — FLUVOXAMINE MALEATE 50 MG PO TABS
50.0000 mg | ORAL_TABLET | Freq: Every day | ORAL | Status: DC
  Administered 2014-11-01 – 2014-11-04 (×4): 50 mg via ORAL
  Filled 2014-11-01 (×4): qty 1

## 2014-11-01 MED ORDER — ATORVASTATIN CALCIUM 20 MG PO TABS: 20.0000 mg | ORAL_TABLET | Freq: Every day | ORAL | Status: DC

## 2014-11-01 MED ORDER — CETYLPYRIDINIUM CHLORIDE 0.05 % MT LIQD
7.0000 mL | Freq: Two times a day (BID) | OROMUCOSAL | Status: DC
  Administered 2014-11-02 – 2014-11-05 (×6): 7 mL via OROMUCOSAL

## 2014-11-01 MED ORDER — MEGESTROL ACETATE 400 MG/10ML PO SUSP
800.0000 mg | Freq: Every day | ORAL | Status: DC
  Administered 2014-11-02 – 2014-11-05 (×4): 800 mg via ORAL
  Filled 2014-11-01 (×5): qty 20

## 2014-11-01 MED ORDER — MIRTAZAPINE 15 MG PO TABS
45.0000 mg | ORAL_TABLET | Freq: Every day | ORAL | Status: DC
  Administered 2014-11-01 – 2014-11-04 (×4): 45 mg via ORAL
  Filled 2014-11-01 (×4): qty 3

## 2014-11-01 MED ORDER — ENSURE ENLIVE PO LIQD
237.0000 mL | Freq: Three times a day (TID) | ORAL | Status: DC
  Administered 2014-11-02 – 2014-11-05 (×12): 237 mL via ORAL

## 2014-11-01 MED ORDER — ONDANSETRON HCL 4 MG/2ML IJ SOLN
4.0000 mg | Freq: Four times a day (QID) | INTRAMUSCULAR | Status: DC | PRN
  Filled 2014-11-01: qty 2

## 2014-11-01 MED ORDER — CHOLECALCIFEROL 10 MCG (400 UNIT) PO TABS
400.0000 [IU] | ORAL_TABLET | Freq: Every day | ORAL | Status: DC
  Administered 2014-11-02 – 2014-11-05 (×4): 400 [IU] via ORAL
  Filled 2014-11-01 (×5): qty 1

## 2014-11-01 MED ORDER — ALUM & MAG HYDROXIDE-SIMETH 200-200-20 MG/5ML PO SUSP: 30.0000 mL | ORAL | Status: DC | PRN

## 2014-11-01 MED ORDER — ASPIRIN 81 MG PO CHEW: 81.0000 mg | CHEWABLE_TABLET | ORAL | Status: DC

## 2014-11-01 MED ORDER — ACETAMINOPHEN 325 MG PO TABS
650.0000 mg | ORAL_TABLET | Freq: Four times a day (QID) | ORAL | Status: DC | PRN
  Filled 2014-11-01 (×3): qty 2

## 2014-11-01 MED ORDER — ADULT MULTIVITAMIN W/MINERALS CH
1.0000 | ORAL_TABLET | Freq: Every day | ORAL | Status: DC
  Administered 2014-11-02 – 2014-11-05 (×4): 1 via ORAL
  Filled 2014-11-01 (×4): qty 1

## 2014-11-01 MED ORDER — ATORVASTATIN CALCIUM 20 MG PO TABS
20.0000 mg | ORAL_TABLET | Freq: Every day | ORAL | Status: DC
  Administered 2014-11-02 – 2014-11-04 (×3): 20 mg via ORAL
  Filled 2014-11-01 (×3): qty 1

## 2014-11-01 MED ORDER — CLOZAPINE 25 MG PO TABS
25.0000 mg | ORAL_TABLET | Freq: Every day | ORAL | Status: DC
  Administered 2014-11-01 – 2014-11-04 (×4): 25 mg via ORAL
  Filled 2014-11-01 (×4): qty 1

## 2014-11-01 MED ORDER — PANTOPRAZOLE SODIUM 40 MG PO TBEC
40.0000 mg | DELAYED_RELEASE_TABLET | Freq: Every day | ORAL | Status: DC
  Administered 2014-11-02 – 2014-11-05 (×4): 40 mg via ORAL
  Filled 2014-11-01 (×4): qty 1

## 2014-11-01 NOTE — Progress Notes (Addendum)
Pt admitted to behavior med from medical floor. Pt states that she is still hopeless with thoughts of suicide. Pt denies having a plan but refuses to contract for safety . Pt has been pleasant and cooperative. Pt states she wants to go home.

## 2014-11-01 NOTE — Progress Notes (Signed)
Per Dr. Weber Cooks patient accepted to the [facility] for inpatient treatment from the medical floor.  Per Ozzie Hoyle, RN Charge patient is accepted to Bed#309 and attending physician is Dr. Bary Leriche.  Ozzie Hoyle, RN and [place] was provided contact information to complete report for transfer. This Probation officer provided transfer information to Management consultant.     Chesley Noon, MSW, LCSW, [place] Clinical Social Worker [phone]

## 2014-11-01 NOTE — Progress Notes (Signed)
Pt remains somewhat agitated.  Pt refusing to have MD called for further instruction.  Pt worried about hospital keeping her if she cannot pay hospital bill.  Pt reassured with therapeutic communication techniques.   Lynnda Shields, RN
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
## 2014-11-01 NOTE — Progress Notes (Signed)
MEDICATION RELATED CONSULT NOTE - INITIAL   Pharmacy Consult for Clozapine  Indication: Clozapine monitoring   Allergies  Allergen Reactions  . Ciprofloxacin Shortness Of Breath and Itching  . Levofloxacin Other (See Comments)    Reaction:  Unknown   . Morphine And Related Nausea And Vomiting  . Sulfa Antibiotics Hives and Itching  . Symbicort [Budesonide-Formoterol Fumarate] Other (See Comments)    Reaction:  Psychotic episode  . Advair Diskus [Fluticasone-Salmeterol] Anxiety    Patient Measurements: Height: '5\' 1"'$  (154.9 cm) Weight: 98 lb 7 oz (44.651 kg) IBW/kg (Calculated) : 47.8 Adjusted Body Weight:   Vital Signs: Temp: 97.6 F (36.4 C) (07/23 0909) Temp Source: Oral (07/23 0909) BP: 138/66 mmHg (07/23 0909) Pulse Rate: 111 (07/23 0909) Intake/Output from previous day: 07/22 0701 - 07/23 0700 In: 730 [P.O.:600; I.V.:130] Out: 2325 [Urine:2325] Intake/Output from this shift: Total I/O In: 240 [P.O.:240] Out: 0   Labs:  Recent Labs  10/30/14 0446 10/31/14 0438 11/01/14 0414  WBC 12.0* 12.9* 14.4*  HGB 13.3 13.6 12.9  HCT 39.2 40.1 38.7  PLT 303 295 283  CREATININE 0.61 0.57 0.59   Estimated Creatinine Clearance: 49.5 mL/min (by C-G formula based on Cr of 0.59).   Microbiology: No results found for this or any previous visit (from the past 720 hour(s)).  Medical History: Past Medical History  Diagnosis Date  . Anxiety   . COPD (chronic obstructive pulmonary disease)   . Anxiety   . 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 ALPRAZolam  0.25 mg Oral TID  . antiseptic oral rinse  7 mL Mouth Rinse q12n4p  . aspirin  81 mg Oral Pre-Cath  . aspirin EC  81 mg Oral Daily  . atorvastatin  20 mg Oral q1800  . chlorhexidine  15 mL Mouth Rinse BID  . cholecalciferol  400 Units Oral Daily  . cloZAPine  25 mg Oral QHS  . docusate sodium  200 mg Oral BID  . enoxaparin (LOVENOX) injection  1 mg/kg Subcutaneous Q12H  . feeding  supplement (ENSURE ENLIVE)  237 mL Oral TID  . fluvoxaMINE  50 mg Oral QHS  . megestrol  800 mg Oral Daily  . metoprolol tartrate  12.5 mg Oral BID  . mirtazapine  45 mg Oral QHS  . multivitamin with minerals  1 tablet Oral Daily  . pantoprazole  40 mg Oral Daily    Assessment: Patient chronically on clozapine, current orders for 25 mg PO qHS  ANC= 8200. Submitted labs to registry   Next lab check 7/27 .   Rayna Sexton L 11/01/2014,10:39 AM

## 2014-11-01 NOTE — Progress Notes (Signed)
Report called to [facility] in [place]. Currently awaiting security to escort pt down with sitter.

## 2014-11-01 NOTE — Progress Notes (Signed)
Spoke with Dr Marcille Blanco regarding pt being agitated and anxious.  Ordered medication.  Medicated per MAR.

## 2014-11-01 NOTE — Discharge Summary (Signed)
[facility] [place] NAME: Melissa George    MR#:  [id number]  DATE OF BIRTH:  [birth year]  DATE OF ADMISSION:  10/29/2014 ADMITTING PHYSICIAN: Lytle Butte, MD  DATE OF DISCHARGE: 11/01/14  PRIMARY CARE PHYSICIAN: No PCP Per Patient    ADMISSION DIAGNOSIS:  Chest Pain  DISCHARGE DIAGNOSIS:  Acute NSTEMI (medical management)  SECONDARY DIAGNOSIS:   Past Medical History  Diagnosis Date  . Anxiety   . COPD (chronic obstructive pulmonary disease)   . Anxiety   . 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 female with a known history of COPD, coronary artery disease, major depressive disorder who was originally admitted approximately one month ago to the behavioral health unit for issues with depression. Her condition has been somewhat improving. however, she complained of an episode of chest pain, acute onset, retrosternal location and was found to have  1) Acute NSTEMI: 0.28> 1.55> 1.93 - chest pain improved at this time. She was treated with therapeutic Lovenox prior to catheterization.  - continue on aspirin and metoprolol. She had cardiac catheterization today showing 20% stenosis at the mid LAD. No comments on ejection fraction. -no  Further recommendations per cardiology other than continuing medical rx -added low dose statins  2) COPD: no exacerbation, Duonebs as needed  3) depression: Per psychiatry. medically cleared she will be transferred back to the inpatient psychiatric unit.  Pt is now on room air with stas 96% on RA -spoke with dr clapacs for transferring her to inpt psych unit  Overall stable. D/c to beh inpt unit  DISCHARGE CONDITIONS:   fair CONSULTS OBTAINED:  Treatment Team:  Corey Skains, MD Gonzella Lex, MD Isaias Cowman, MD  DRUG ALLERGIES:   Allergies  Allergen Reactions  . Ciprofloxacin Shortness Of Breath and Itching  . Levofloxacin Other (See Comments)     Reaction:  Unknown   . Morphine And Related Nausea And Vomiting  . Sulfa Antibiotics Hives and Itching  . Symbicort [Budesonide-Formoterol Fumarate] Other (See Comments)    Reaction:  Psychotic episode  . Advair Diskus [Fluticasone-Salmeterol] Anxiety    DISCHARGE MEDICATIONS:   Current Discharge Medication List    START taking these medications   Details  aspirin 81 MG chewable tablet Chew 1 tablet (81 mg total) by mouth before cath procedure. Qty: 30 tablet, Refills: 0    atorvastatin (LIPITOR) 20 MG tablet Take 1 tablet (20 mg total) by mouth daily at 6 PM. Qty: 30 tablet, Refills: 0    !! cholecalciferol (D-VI-SOL) 400 UNIT/ML LIQD Take 1 mL (400 Units total) by mouth daily. Qty: 30 mL, Refills: 1    metoprolol tartrate (LOPRESSOR) 25 MG tablet Take 0.5 tablets (12.5 mg total) by mouth 2 (two) times daily. Qty: 60 tablet, Refills: 0    nitroGLYCERIN (NITROSTAT) 0.4 MG SL tablet Place 1 tablet (0.4 mg total) under the tongue every 5 (five) minutes as needed for chest pain. Qty: 20 tablet, Refills: 2     !! - Potential duplicate medications found. Please discuss with provider.    CONTINUE these medications which have NOT CHANGED   Details  ALPRAZolam (XANAX) 0.25 MG tablet Take 1 tablet (0.25 mg total) by mouth 3 (three) times daily with meals. Qty: 90 tablet, Refills: 0    !! cholecalciferol (D-VI-SOL) 400 UNIT/ML LIQD Take 1 mL (400 Units total) by mouth daily with breakfast. Qty: 30 mL  cloZAPine (CLOZARIL) 25 MG tablet Take 1 tablet (25 mg total) by mouth at bedtime. Qty: 30 tablet, Refills: 0    docusate sodium (COLACE) 100 MG capsule Take 2 capsules (200 mg total) by mouth 2 (two) times daily. Qty: 120 capsule, Refills: 0    feeding supplement, ENSURE ENLIVE, (ENSURE ENLIVE) LIQD Take 237 mLs by mouth 4 (four) times daily. Qty: 237 mL, Refills: 12    fluvoxaMINE (LUVOX) 50 MG tablet Take 1 tablet (50 mg total) by mouth at bedtime. Qty: 30 tablet, Refills: 0     ipratropium-albuterol (DUONEB) 0.5-2.5 (3) MG/3ML SOLN Take 3 mLs by nebulization every 6 (six) hours as needed. Qty: 360 mL, Refills: 0    magnesium citrate SOLN Take 296 mLs (1 Bottle total) by mouth 2 (two) times a week. Qty: 195 mL    megestrol (MEGACE) 400 MG/10ML suspension Take 20 mLs (800 mg total) by mouth at bedtime. Qty: 240 mL, Refills: 0    mirtazapine (REMERON) 45 MG tablet Take 1 tablet (45 mg total) by mouth at bedtime. Qty: 30 tablet, Refills: 0    Multiple Vitamin (MULTIVITAMIN WITH MINERALS) TABS tablet Take 1 tablet by mouth daily with breakfast.    senna (SENOKOT) 8.6 MG TABS tablet Take 2 tablets (17.2 mg total) by mouth daily. Qty: 60 each, Refills: 0    tiotropium (SPIRIVA) 18 MCG inhalation capsule Place 18 mcg into inhaler and inhale daily.     !! - Potential duplicate medications found. Please discuss with provider.      If you experience worsening of your admission symptoms, develop shortness of breath, life threatening emergency, suicidal or homicidal thoughts you must seek medical attention immediately by calling 911 or calling your MD immediately  if symptoms less severe.  You Must read complete instructions/literature along with all the possible adverse reactions/side effects for all the Medicines you take and that have been prescribed to you. Take any new Medicines after you have completely understood and accept all the possible adverse reactions/side effects.   Please note  You were cared for by a hospitalist during your hospital stay. If you have any questions about your discharge medications or the care you received while you were in the hospital after you are discharged, you can call the unit and asked to speak with the hospitalist on call if the hospitalist that took care of you is not available. Once you are discharged, your primary care physician will handle any further medical issues. Please note that NO REFILLS for any discharge medications  will be authorized once you are discharged, as it is imperative that you return to your primary care physician (or establish a relationship with a primary care physician if you do not have one) for your aftercare needs so that they can reassess your need for medications and monitor your lab values. Today   SUBJECTIVE   i am worried about my bills. Denies cp  VITAL SIGNS:  Blood pressure 138/66, pulse 111, temperature 97.6 F (36.4 C), temperature source Oral, resp. rate 16, height '5\' 1"'$  (1.549 m), weight 44.651 kg (98 lb 7 oz), SpO2 96 %.  I/O:   Intake/Output Summary (Last 24 hours) at 11/01/14 0941 Last data filed at 11/01/14 0933  Gross per 24 hour  Intake    970 ml  Output   2325 ml  Net  -1355 ml    PHYSICAL EXAMINATION:  GENERAL:  64 y.o.-year-old patient lying in the bed with no acute distress. Thin EYES: Pupils equal,  round, reactive to light and accommodation. No scleral icterus. Extraocular muscles intact.  HEENT: Head atraumatic, normocephalic. Oropharynx and nasopharynx clear.  NECK:  Supple, no jugular venous distention. No thyroid enlargement, no tenderness.  LUNGS:decreased sounds bilaterally, no wheezing, rales,rhonchi or crepitation. No use of accessory muscles of respiration.  CARDIOVASCULAR: S1, S2 normal. No murmurs, rubs, or gallops.  ABDOMEN: Soft, non-tender, non-distended. Bowel sounds present. No organomegaly or mass.  EXTREMITIES: No pedal edema, cyanosis, or clubbing.  NEUROLOGIC: Cranial nerves II through XII are intact. Muscle strength 5/5 in all extremities. Sensation intact. Gait not checked.  PSYCHIATRIC: The patient is alert and oriented x 3.  SKIN: No obvious rash, lesion, or ulcer.   DATA REVIEW:   CBC   Recent Labs Lab 11/01/14 0414  WBC 14.4*  HGB 12.9  HCT 38.7  PLT 283    Chemistries   Recent Labs Lab 11/01/14 0414  NA 140  K 3.9  CL 106  CO2 27  GLUCOSE 126*  BUN 24*  CREATININE 0.59  CALCIUM 8.5*    Microbiology  Results   No results found for this or any previous visit (from the past 240 hour(s)).  RADIOLOGY:  No results found.   Management plans discussed with the patient, family and they are in agreement.  CODE STATUS:     Code Status Orders        Start     Ordered   10/29/14 0226  Full code   Continuous     10/29/14 0225      TOTAL TIME TAKING CARE OF THIS PATIENT: 40 minutes.    Olon Russ M.D on 11/01/2014 at 9:41 AM  Between 7am to 6pm - Pager - [phone] After 6pm go to [email or web address] - password [email or web address] [facility]  [facility]  Office  [phone]  CC: Primary care physician; No PCP Per Patient

## 2014-11-01 NOTE — Progress Notes (Signed)
Pt is a&o, VSS, NSR on tele with no complaints of pain or discomfort. Cath site clear of bleeding or hematoma and pt weaned from 2L to room air. Pt medically cleared for discharge to [facility]. Currently awaiting Psychiatrist evaluation for admission to [facility]. Sitter remaining at bedside for IVC status.

## 2014-11-01 NOTE — Progress Notes (Signed)
   11/01/14 1000  Clinical Encounter Type  Visited With Patient  Visit Type Follow-up  Referral From Chaplain  Consult/Referral To [facility] (For Healthcare)  Does patient have an advance directive? Yes  Chaplain followed up with patient to complete HPOA. Forms have been completed and filed by nurse, patient has original and a voice mail was left for son notifying him of the completion as requested. Chaplain Ginny Loomer Ext. [phone]

## 2014-11-01 NOTE — Progress Notes (Signed)
Physical Therapy Evaluation Patient Details Name: Melissa George MRN: [id number] DOB: Aug 07, 1950 Today's Date: 11/01/2014   History of Present Illness  Patient is a 64 y.o. female admitted with Non-ST elevated MI. Had cardiac catheterization on 22 July.  Clinical Impression  Patient demonstrated ability to perform bed mobility and normal household ambulation without difficulty which is her baseline. Stairs were not assessed on evaluation, but patient has 4 steps without railing to enter home. Patient may benefit from practicing prior to d/c but is not requiring further follow up from PT at this time.    Follow Up Recommendations No PT follow up    Equipment Recommendations  None recommended by PT    Recommendations for Other Services       Precautions / Restrictions Precautions Precautions: Fall Restrictions Weight Bearing Restrictions: No      Mobility  Bed Mobility Overal bed mobility: Independent             General bed mobility comments: Patient demonstrated good safety awareness.  Transfers Overall transfer level: Independent Equipment used: Rolling walker (2 wheeled);1 person hand held assist             General transfer comment: Patient utilized RW initially. Was able to perform stand to sit with hand held assist.  Ambulation/Gait Ambulation/Gait assistance: Modified independent (Device/Increase time);Min assist Ambulation Distance (Feet): 100 Feet (50 feet with RW, 50 feet with HHA) Assistive device: Rolling walker (2 wheeled);1 person hand held assist Gait Pattern/deviations: Step-through pattern (Minor staggering with HHA)   Gait velocity interpretation: <1.8 ft/sec, indicative of risk for recurrent falls General Gait Details: Patient ambulated 39' with RW, having tendency to push too far in front of her. When ambulating with HHA, patient experienced no LOB/falls. Did have occasional deviations with slight stagger.  Stairs             Wheelchair Mobility    Modified Rankin (Stroke Patients Only)       Balance Overall balance assessment: Independent                                           Pertinent Vitals/Pain Pain Assessment: 0-10 Pain Score: 8  Pain Location: Low back Pain Descriptors / Indicators: Aching Pain Intervention(s): Limited activity within patient's tolerance;Monitored during session;Repositioned    Home Living Family/patient expects to be discharged to:: Private residence Living Arrangements: Alone Available Help at Discharge: Friend(s);Other (Comment) (Friend is available by phone. Helps with groceries.) Type of Home: House Home Access: Stairs to enter Entrance Stairs-Rails: None Entrance Stairs-Number of Steps: 4 Home Layout: One level Home Equipment: None      Prior Function Level of Independence: Independent               Hand Dominance        Extremity/Trunk Assessment   Upper Extremity Assessment: Overall WFL for tasks assessed           Lower Extremity Assessment: Overall WFL for tasks assessed         Communication   Communication: No difficulties  Cognition Arousal/Alertness: Lethargic Behavior During Therapy: WFL for tasks assessed/performed Overall Cognitive Status: Within Functional Limits for tasks assessed                      General Comments      Exercises        Assessment/Plan  PT Assessment Patent does not need any further PT services  PT Diagnosis Acute pain   PT Problem List    PT Treatment Interventions     PT Goals (Current goals can be found in the Care Plan section) Acute Rehab PT Goals Patient Stated Goal: "I want to go home." PT Goal Formulation: With patient Time For Goal Achievement: 11/15/14 Potential to Achieve Goals: Good    Frequency     Barriers to discharge        Co-evaluation               End of Session Equipment Utilized During Treatment: Gait belt Activity  Tolerance: Patient tolerated treatment well;Other (comment) (Slight SOB, O2 saturations 93%) Patient left: in chair;with nursing/sitter in room Nurse Communication: Mobility status         Time: 1610-9604 PT Time Calculation (min) (ACUTE ONLY): 17 min   Charges:   PT Evaluation $Initial PT Evaluation Tier I: 1 Procedure     PT G Codes:        Dorice Lamas, PT, DPT 11/01/2014, 1:19 PM

## 2014-11-02 ENCOUNTER — Encounter: Payer: Self-pay | Admitting: General Practice

## 2014-11-02 DIAGNOSIS — F333 Major depressive disorder, recurrent, severe with psychotic symptoms: Principal | ICD-10-CM

## 2014-11-02 NOTE — Progress Notes (Signed)
Patient ID: Melissa George, female   DOB: [birth year], 64 y.o.   MRN: [id number]  CSW spoke with [name] this morning. Pt asked about discharge and if she had to pay the bill before she can be discharged. She was ensured that this was not the case. However, she was able to express that she was feeling anxious and has left her room with encouragement.   [place] MSW, [place]  11/02/2014 10:08 AM

## 2014-11-02 NOTE — Plan of Care (Signed)
Problem: Diagnosis: Increased Risk For Suicide Attempt Goal: LTG-Patient Will Report Improved Mood and Deny Suicidal LTG (by discharge) Patient will report improved mood and deny suicidal ideation.  Outcome: Not Progressing Patient remains anxious and depressed, hopeless.
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
## 2014-11-02 NOTE — Plan of Care (Signed)
Problem: Diagnosis: Increased Risk For Suicide Attempt Goal: STG-Patient Will Attend All Groups On The Unit Outcome: Progressing Patient isolative in room, sad and hopeless.staff continue to encourage

## 2014-11-02 NOTE — Plan of Care (Signed)
Problem: Diagnosis: Increased Risk For Suicide Attempt Goal: STG-Patient Will Comply With Medication Regime Outcome: Progressing Patient taking her medications as scheduled

## 2014-11-02 NOTE — [facility] Counselor (Signed)
[place] LCSW Group Therapy  11/02/2014 2:33 PM  Type of Therapy:  Group Therapy  Participation Level:  Did Not Attend  Modes of Intervention:  Discussion, Education, Socialization and Support  Summary of Progress/Problems:Pts were asked to identify what balance means to them. They were encouraged to identify what throws them off balance and how to regain balance.   [place] MSW, [place]  11/02/2014, 2:33 PM
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
## 2014-11-02 NOTE — Progress Notes (Signed)
D: Pt is awake in bed this evening. Pt mood is depressed and her affect is sad. Pt is isolating to her room ans states that she "just wants to die, and doesn't have the energy to do anything." Pt states that she does not plan to harm herself, and a safety sitter is at the bedside while she is on O2 during sleep. Pt V/S are WNL and her pulse is strong and regular. Pt denies chest pain and her skin is warm and dry.  A: Writer instructed pt to inform staff in the event of chest pain. Writer provided emotional support and administered medications as prescribed. Writer also encouraged proper nutrition as a means to improve outcomes.  R: Pt continues to express hopelessness/helplessness, and wants to be discharged as soon as possible. Pt would like to go and live with her friend, who has already agreed to have her live there.

## 2014-11-02 NOTE — Progress Notes (Addendum)
20:00: Patient currently in bed awake. Looks sad and depressed but denies suicidal/homicidal thoughts and hallucinations. No sign of distress noted. Staff continue to monitor for safety and other needs, and emotional support offered.  2300: patient has had her bed time medications along with ensure as recommended. O2 Sat administered @ 2L as ordered. Currently in room awake, sitter at bedside. Safety and security maintained.

## 2014-11-02 NOTE — BHH Suicide Risk Assessment (Signed)
[facility] Admission Suicide Risk Assessment   Nursing information obtained from:    Demographic factors:    Current Mental Status:    Loss Factors:    Historical Factors:    Risk Reduction Factors:    Total Time spent with patient: 45 minutes Principal Problem: Recurrent major depression-severe Diagnosis:   Patient Active Problem List   Diagnosis Date Noted  . Recurrent major depression-severe [F33.2] 11/01/2014  . NSTEMI (non-ST elevated myocardial infarction) [I21.4] 10/29/2014  . Protein-calorie malnutrition, severe [E43] 10/29/2014  . 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 Fatigue [R53.83] 09/28/2014  . 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 Anorexia [R63.0] 09/12/2014  . COPD (chronic obstructive pulmonary disease) [J44.9] 09/12/2014  . GAD (generalized anxiety disorder) [F41.1]      Continued Clinical Symptoms:  Alcohol Use Disorder Identification Test Final Score (AUDIT): 0 The "Alcohol Use Disorders Identification Test", Guidelines for Use in Primary Care, Second Edition.  World Pharmacologist [facility]). Score between 0-7:  no or low risk or alcohol related problems. Score between 8-15:  moderate risk of alcohol related problems. Score between 16-19:  high risk of alcohol related problems. Score 20 or above:  warrants further diagnostic evaluation for alcohol dependence and treatment.   CLINICAL FACTORS:   Severe Anxiety and/or Agitation Anorexia Nervosa Depression:   Anhedonia Delusional Hopelessness Insomnia More than one psychiatric diagnosis Unstable or Poor Therapeutic Relationship Medical Diagnoses and Treatments/Surgeries   Musculoskeletal: Strength & Muscle Tone: decreased Gait & Station: unsteady Patient leans: N/A  Psychiatric Specialty Exam: Physical Exam  ROS  Blood pressure 102/42, pulse 91, temperature 97.8 F (36.6 C), temperature source Oral, resp. rate 20, height '5\' 1"'$   (1.549 m), weight 40.824 kg (90 lb), SpO2 95 %.Body mass index is 17.01 kg/(m^2).  General Appearance: Casual  Eye Contact::  Minimal  Speech:  Slow  Volume:  Decreased  Mood:  Dysphoric  Affect:  Depressed  Thought Process:  Disorganized  Orientation:  Full (Time, Place, and Person)  Thought Content:  Rumination  Suicidal Thoughts:  No  Homicidal Thoughts:  No  Memory:  Immediate;   Fair Recent;   Fair Remote;   Fair  Judgement:  Impaired  Insight:  Lacking  Psychomotor Activity:  Decreased  Concentration:  Poor  Recall:  Poor  Fund of Knowledge:Poor  Language: Fair  Akathisia:  No  Handed:  Right  AIMS (if indicated):     Assets:  Social Support  Sleep:     Cognition: Impaired,  Mild  ADL's:  Intact     COGNITIVE FEATURES THAT CONTRIBUTE TO RISK:  Closed-mindedness and Thought constriction (tunnel vision)    SUICIDE RISK:   Moderate:  Frequent suicidal ideation with limited intensity, and duration, some specificity in terms of plans, no associated intent, good self-control, limited dysphoria/symptomatology, some risk factors present, and identifiable protective factors, including available and accessible social support.  PLAN OF CARE: Patient admitted back to psychiatry ward. Continue current medications. She is very familiar to the staff there and will continue being evaluated and monitored for a safe continued treatment plan. 15 minute checks in place. Staff aware of the patient's condition.  Medical Decision Making:  Review of Psycho-Social Stressors (1), Established Problem, Worsening (2), Review or order medicine tests (1) and Review of Medication Regimen & Side Effects (2)  I certify that inpatient services furnished can reasonably be expected to improve the patient's condition.   Melissa George 11/02/2014, 3:26 PM

## 2014-11-03 DIAGNOSIS — F332 Major depressive disorder, recurrent severe without psychotic features: Secondary | ICD-10-CM
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
## 2014-11-03 NOTE — BHH Group Notes (Signed)
[facility] LCSW Group Therapy  11/03/2014 3:32 PM  Type of Therapy:  Group Therapy  Participation Level:  Did Not Attend    [name], MSW, LCSWA 11/03/2014, 3:32 PM

## 2014-11-03 NOTE — Progress Notes (Signed)
Recreation Therapy Notes  INPATIENT RECREATION THERAPY ASSESSMENT  Patient Details Name: Melissa George MRN: [id number] DOB: 10-Aug-1950 Today's Date: 11/03/2014  Patient Stressors: Family, Friends, Other (Comment) (Lack of friends; hospital bill)  Coping Skills:   Isolate, Avoidance, Music  Personal Challenges: Anger, Communication, Concentration, Decision-Making, Expressing Yourself, Problem-Solving, Relationships, Self-Esteem/Confidence, Social Interaction, Stress Management, Time Management, Trusting Others  Leisure Interests (2+):   (Patient shook her head 'no')  Awareness of Community Resources:  No  Community Resources:     Current Use:    If no, Barriers?:    Patient Strengths:   (Patient shook her head 'no')  Patient Identified Areas of Improvement:  Everything  Current Recreation Participation:  Nothing  Patient Goal for Hospitalization:  TO get better  [place] of Residence:  [place] of Residence:  [place]   Current SI (including self-harm):  Yes ("I think about it.")  Current HI:  No  Consent to Intern Participation: N/A   Leonette Monarch, LRT/CTRS 11/03/2014, 1:56 PM

## 2014-11-03 NOTE — Plan of Care (Signed)
Problem: Consults Goal: Suicide Risk Patient Education (See Patient Education module for education specifics)  Outcome: Progressing Denies SI ideations.

## 2014-11-03 NOTE — Progress Notes (Signed)
Recreation Therapy Notes  Date: 07.25.16 Time: 3:00 pm Location: Craft Room  Group Topic: Self-expression  Goal Area(s) Addresses:  Patient will effectively use art as a means of self-expression. Patient will recognize positive benefit of self-expression. Patient will be able to identify one emotion experienced during group session. Patient will identify use of art/self-expression as a coping skill.  Behavioral Response: Reserved, Withdrawn  Intervention: Two Faces of Me  Activity: Patients were given a blank face worksheet and instructed to draw a line down the middle. On one side they were instructed to draw or write how they felt when they were admitted to the hospital. On the other side they were instructed to draw or write how they want to feel when they are d/c from the hospital.  Education: LRT educated patients on different forms of self-expression.  Education Outcome: In group clarification offered  Clinical Observations/Feedback: Patient did not participate in group activity. Patient did not contribute to group discussion.  Leonette Monarch, LRT/CTRS 11/03/2014 4:07 PM

## 2014-11-03 NOTE — Tx Team (Signed)
Initial Interdisciplinary Treatment Plan   PATIENT STRESSORS: Financial difficulties Health problems   PATIENT STRENGTHS: Average or above average intelligence Supportive family/friends   PROBLEM LIST: Problem List/Patient Goals Date to be addressed Date deferred Reason deferred Estimated date of resolution  Delusional thinking      Eating disorder                                                 DISCHARGE CRITERIA:  Adequate post-discharge living arrangements Safe-care adequate arrangements made  PRELIMINARY DISCHARGE PLAN: Outpatient therapy Placement in alternative living arrangements  PATIENT/FAMIILY INVOLVEMENT: This treatment plan has been presented to and reviewed with the patient, Melissa George, and/or family member.  The patient and family have been given the opportunity to ask questions and make suggestions.  [name] 11/03/2014, 4:16 PM

## 2014-11-03 NOTE — Progress Notes (Signed)
Patient has been attending some groups today despite initial resistance. Given positive reinforcement.

## 2014-11-03 NOTE — BHH Group Notes (Signed)
[place] Group Notes:  (Nursing/MHT/Case Management/Adjunct)  Date:  11/03/2014  Time:  12:28 PM  Type of Therapy:  Psychoeducational Skills  Participation Level:  Active  Participation Quality:  Appropriate  Affect:  Appropriate and Flat  Cognitive:  Alert and Lacking  Insight:  Lacking  Engagement in Group:  Engaged and Improving  Modes of Intervention:  Discussion, Education and Support  Summary of Progress/Problems:  Melissa George 11/03/2014, 12:28 PM

## 2014-11-03 NOTE — Progress Notes (Addendum)
Patient is alert and oriented. Her mood is apathetic, with blunted affect. Passive suicidal thoughts persist but patient admits to being safe in the hospital. Delusional thinking regarding personal economic issues remain unchanged. Speech is soft, slow. Thoughts are concrete. Patient has no interest in attending therapeutic groups. She says she needs to stay in bed. Plan - monitor mood, safety. Monitor respiratory, cardiac, and nutritional status. Encourage walking on the unit. Encourage participation in program activities. Maintain 15 minute checks for safety.

## 2014-11-03 NOTE — Plan of Care (Signed)
Problem: Diagnosis: Increased Risk For Suicide Attempt Goal: LTG-Patient Will Show Positive Response to Medication LTG (by discharge) : Patient will show positive response to medication and will participate in the development of the discharge plan.  Outcome: Not Progressing C/O continued depression

## 2014-11-03 NOTE — Progress Notes (Signed)
Initial Nutrition Assessment     INTERVENTION:   Meals and Snacks: Cater to patient preferences; may benefit from liberalizing diet to Regular to promote po intake; already on megace and remeron, MVI Medical Food Supplement Therapy: continue Ensure Enlive po TID, each supplement provides 350 kcal and 20 grams of protein  NUTRITION DIAGNOSIS:   Inadequate oral intake related to poor appetite, chronic illness as evidenced by meal completion < 50%.   GOAL:   Patient will meet greater than or equal to 90% of their needs   MONITOR:    (Energy Intake, Anthropometrics,Digestive system)  REASON FOR ASSESSMENT:   Malnutrition Screening Tool    ASSESSMENT:    Pt transferred back to behavioral after being on medical floor for chest pain/MI; pt being treated for depression  Past Medical History  Diagnosis Date  . Anxiety   . COPD (chronic obstructive pulmonary disease)   . Anxiety   . 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 Protein Profile: No results for input(s): ALBUMIN in the last 168 hours.   Height:   Ht Readings from Last 1 Encounters:  11/01/14 '5\' 1"'$  (1.549 m)    Weight:   Wt Readings from Last 1 Encounters:  11/03/14 91 lb (41.277 kg)    Filed Weights   11/01/14 1700 11/03/14 0632  Weight: 90 lb (40.824 kg) 91 lb (41.277 kg)     Wt Readings from Last 10 Encounters:  11/03/14 91 lb (41.277 kg)  10/31/14 98 lb 7 oz (44.651 kg)  10/25/14 89 lb (40.37 kg)  09/11/14 77 lb (34.927 kg)  07/10/14  75 lb 8 oz (34.247 kg)  03/11/14 82 lb 5 oz (37.337 kg)    BMI:  Body mass index is 17.2 kg/(m^2).  LOW Care Level  Kerman Passey MS, [place], LDN [phone] Pager

## 2014-11-03 NOTE — Plan of Care (Signed)
Problem: Diagnosis: Increased Risk For Suicide Attempt Goal: STG-Patient Will Report Suicidal Feelings to Staff Outcome: Progressing Denies SI

## 2014-11-04 NOTE — Progress Notes (Signed)
Recreation Therapy Notes  Date: 07.26.16 Time: 3:00 pm Location: Craft Room  Group Topic: Coping Skills  Goal Area(s) Addresses:  Patient will verbalize an emotion experienced during group. Patient will verbalize benefit of using art as a coping skill.  Behavioral Response: Did not attend  Intervention: Coloring  Activity: Patients were given coloring sheets and instructed to color.   Education: LRT educated patients on benefit of using art as a Technical sales engineer.  Education Outcome: Patient did not attend group.   Clinical Observations/Feedback: Patient did not attend group.  Leonette Monarch, LRT/CTRS 11/04/2014 4:17 PM

## 2014-11-04 NOTE — BHH Group Notes (Signed)
[facility] LCSW Aftercare Discharge Planning Group Note  11/04/2014 7:41 AM  Participation Quality:  Appropriate  Affect:  Flat  Cognitive:  Appropriate  Insight:  Developing/Improving  Engagement in Group:  Developing/Improving  Modes of Intervention:  Discussion, Education and Support  Summary of Progress/Problems:Patients goal today was to come to group and speak to [name], Melissa George 11/04/2014, 7:41 AM
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
## 2014-11-04 NOTE — Plan of Care (Signed)
Problem: Diagnosis: Increased Risk For Suicide Attempt Goal: LTG-Patient Will Show Positive Response to Medication LTG (by discharge) : Patient will show positive response to medication and will participate in the development of the discharge plan.  Outcome: Progressing Verbalizing feeling on discharge

## 2014-11-04 NOTE — BHH Group Notes (Signed)
[facility] Group Notes:  (Nursing/MHT/Case Management/Adjunct)  Date:  11/04/2014  Time:  2:07 PM  Type of Therapy:  Psychoeducational Skills  Participation Level:  Did Not Attend    Leonie Douglas 11/04/2014, 2:07 PM

## 2014-11-04 NOTE — Progress Notes (Addendum)
[facility] LCSW Group Therapy  11/04/2014 2:38 PM  Type of Therapy:  Group Therapy  Participation Level:  Did Not Attend  Participation Quality:    Affect:    Cognitive:    Insight:    Engagement in Therapy:    Modes of Intervention:    Summary of Progress/Problems:  Joana Reamer 11/04/2014, 2:38 PM

## 2014-11-04 NOTE — Progress Notes (Signed)
D: Patient stated slept poor last night .Stated appetite is good and energy level  low. Stated concentration is good . Stated on Depression scale 10 , hopeless 10 and anxiety10  .( low 0-10 high) Denies suicidal  homicidal ideations  .  No auditory hallucinations  No pain concerns . Appropriate ADL'S. No  Interacting with peers and staff. Feels she still owes money A: Encourage patient participation with unit programming . Instruction  Given on  Medication , verbalize understanding. R: Voice no other concerns. Staff continue to monitor

## 2014-11-04 NOTE — Progress Notes (Signed)
Has isolated self to room for majority of evening. Appears sad and depressed. Rates depression at 10. Denies SI, HI and AVH. Did c/o back pain and was given PO Tylenol with relief. Was placed on 2L oxygen by nasal cannula at bedtime with 1:1 sitter.

## 2014-11-04 NOTE — Plan of Care (Signed)
Problem: Ineffective individual coping Goal: LTG: Patient will report a decrease in negative feelings Outcome: Progressing Thought process positive thinking

## 2014-11-05 LAB — CBC WITH DIFFERENTIAL/PLATELET
Basophils Absolute: 0.1 10*3/uL (ref 0–0.1)
Basophils Relative: 1 %
Eosinophils Absolute: 0.5 10*3/uL (ref 0–0.7)
Eosinophils Relative: 4 %
HEMATOCRIT: 43.7 % (ref 35.0–47.0)
Hemoglobin: 14.5 g/dL (ref 12.0–16.0)
LYMPHS ABS: 2.5 10*3/uL (ref 1.0–3.6)
LYMPHS PCT: 20 %
MCH: 31.6 pg (ref 26.0–34.0)
MCHC: 33.1 g/dL (ref 32.0–36.0)
MCV: 95.3 fL (ref 80.0–100.0)
MONO ABS: 1.3 10*3/uL — AB (ref 0.2–0.9)
Monocytes Relative: 10 %
NEUTROS ABS: 8.2 10*3/uL — AB (ref 1.4–6.5)
Neutrophils Relative %: 65 %
Platelets: 276 10*3/uL (ref 150–440)
RBC: 4.58 MIL/uL (ref 3.80–5.20)
RDW: 13.4 % (ref 11.5–14.5)
WBC: 12.6 10*3/uL — ABNORMAL HIGH (ref 3.6–11.0)

## 2014-11-05 LAB — CLOZAPINE (CLOZARIL)
Clozapine Lvl: 131 ng/mL — ABNORMAL LOW (ref 350–650)
NorClozapine: 64 ng/mL
TOTAL(CLOZ+ NORCLOZ): 195 ng/mL

## 2014-11-05 MED ORDER — MEGESTROL ACETATE 400 MG/10ML PO SUSP: 800.0000 mg | Freq: Every day | ORAL | Status: DC

## 2014-11-05 MED ORDER — ADULT MULTIVITAMIN W/MINERALS CH: 1.0000 | ORAL_TABLET | Freq: Every day | ORAL | Status: DC

## 2014-11-05 MED ORDER — CLOZAPINE 25 MG PO TABS: 25.0000 mg | ORAL_TABLET | Freq: Every day | ORAL | Status: DC

## 2014-11-05 MED ORDER — PANTOPRAZOLE SODIUM 40 MG PO TBEC: 40.0000 mg | DELAYED_RELEASE_TABLET | Freq: Every day | ORAL | Status: DC

## 2014-11-05 MED ORDER — SENNA 8.6 MG PO TABS: 2.0000 | ORAL_TABLET | Freq: Every day | ORAL | Status: DC

## 2014-11-05 MED ORDER — MIRTAZAPINE 45 MG PO TABS: 45.0000 mg | ORAL_TABLET | Freq: Every day | ORAL | Status: DC

## 2014-11-05 MED ORDER — ATORVASTATIN CALCIUM 20 MG PO TABS: 20.0000 mg | ORAL_TABLET | Freq: Every day | ORAL | Status: DC

## 2014-11-05 MED ORDER — ALPRAZOLAM 0.25 MG PO TABS: 0.2500 mg | ORAL_TABLET | Freq: Three times a day (TID) | ORAL | Status: DC

## 2014-11-05 MED ORDER — TIOTROPIUM BROMIDE MONOHYDRATE 18 MCG IN CAPS: 18.0000 ug | ORAL_CAPSULE | Freq: Every day | RESPIRATORY_TRACT | Status: DC

## 2014-11-05 MED ORDER — CLOZAPINE 25 MG PO TABS: 50.0000 mg | ORAL_TABLET | Freq: Every day | ORAL | Status: DC

## 2014-11-05 MED ORDER — FLUVOXAMINE MALEATE 50 MG PO TABS: 50.0000 mg | ORAL_TABLET | Freq: Every day | ORAL | Status: DC

## 2014-11-05 MED ORDER — METOPROLOL TARTRATE 25 MG PO TABS: 12.5000 mg | ORAL_TABLET | Freq: Two times a day (BID) | ORAL | Status: DC

## 2014-11-05 MED ORDER — ASPIRIN 81 MG PO CHEW: 81.0000 mg | CHEWABLE_TABLET | ORAL | Status: DC

## 2014-11-05 MED ORDER — CHOLECALCIFEROL 400 UNIT/ML PO LIQD: 400.0000 [IU] | Freq: Every day | ORAL | Status: DC

## 2014-11-05 MED ORDER — NITROGLYCERIN 0.4 MG SL SUBL: 0.4000 mg | SUBLINGUAL_TABLET | SUBLINGUAL | Status: DC | PRN

## 2014-11-05 MED ORDER — DOCUSATE SODIUM 100 MG PO CAPS: 200.0000 mg | ORAL_CAPSULE | Freq: Two times a day (BID) | ORAL | Status: DC

## 2014-11-05 NOTE — Progress Notes (Signed)
Recreation Therapy Notes  Date: 07.27.16 Time: 3:00 pm Location: Craft Room  Group Topic: Self-esteem  Goal Area(s) Addresses:  Patient will identify positive attributes about self. Patient will identify at least one coping skill.  Behavioral Response: Did not attend  Intervention: All About Me  Activity: Patients were instructed to make an "All About Me" pamphlet with their life's motto, positive traits, coping skills, and their healthy support system.  Education: LRT educated patients on ways to increase their self-esteem.   Education Outcome: Patient did not attend group.  Clinical Observations/Feedback: Patient did not attend group.  Leonette Monarch, LRT/CTRS 11/05/2014 4:12 PM

## 2014-11-05 NOTE — BHH Group Notes (Signed)
[place] LCSW Group Therapy  11/05/2014 4:05 PM  Type of Therapy:  Group Therapy  Participation Level:  Did Not Attend  Modes of Intervention:  Discussion, Education, Socialization and Support  Summary of Progress/Problems: Emotional Regulation: Patients will identify both negative and positive emotions. They will discuss emotions they have difficulty regulating and how they impact their lives. Patients will be asked to identify healthy coping skills to combat unhealthy reactions to negative emotions.     [place] MSW, [place]  11/05/2014, 4:05 PM

## 2014-11-05 NOTE — BHH Suicide Risk Assessment (Signed)
[facility] Discharge Suicide Risk Assessment   Demographic Factors:  Age 66 or older, Divorced or widowed and Caucasian  Total Time spent with patient: 30 minutes  Musculoskeletal: Strength & Muscle Tone: within normal limits Gait & Station: normal Patient leans: N/A  Psychiatric Specialty Exam: Physical Exam  Nursing note and vitals reviewed.   Review of Systems  All other systems reviewed and are negative.   Blood pressure 113/71, pulse 99, temperature 99 F (37.2 C), temperature source Oral, resp. rate 20, height '5\' 1"'$  (1.549 m), weight 39.463 kg (87 lb), SpO2 97 %.Body mass index is 16.45 kg/(m^2).  General Appearance: Fairly Groomed  Engineer, water::  Fair  Speech:  Clear and [id number]  Volume:  Decreased  Mood:  Euthymic  Affect:  Appropriate  Thought Process:  Goal Directed  Orientation:  Full (Time, Place, and Person)  Thought Content:  Delusions and Paranoid Ideation  Suicidal Thoughts:  No  Homicidal Thoughts:  No  Memory:  Immediate;   Fair Recent;   Fair Remote;   Fair  Judgement:  Fair  Insight:  Fair  Psychomotor Activity:  Normal  Concentration:  Fair  Recall:  [employer] [place]  Language: Fair  Akathisia:  No  Handed:  Right  AIMS (if indicated):     Assets:  Desire for Improvement Financial Resources/Insurance Housing Social Support  Sleep:  Number of Hours: 7  Cognition: WNL  ADL's:  Intact      Has this patient used any form of tobacco in the last 30 days? (Cigarettes, Smokeless Tobacco, Cigars, and/or Pipes) No  Mental Status Per Nursing Assessment::   On Admission:     Current Mental Status by Physician: NA  Loss Factors: Decline in physical health and Financial problems/change in socioeconomic status  Historical Factors: NA  Risk Reduction Factors:   Sense of responsibility to family, Living with another person, especially a relative and Positive social support  Continued Clinical Symptoms:  Depression:    Severe Currently Psychotic  Cognitive Features That Contribute To Risk:  None    Suicide Risk:  Minimal: No identifiable suicidal ideation.  Patients presenting with no risk factors but with morbid ruminations; may be classified as minimal risk based on the severity of the depressive symptoms  Principal Problem: Major depressive disorder, recurrent episode, severe with catatonia Discharge Diagnoses:  Patient Active Problem List   Diagnosis Date Noted  . NSTEMI (non-ST elevated myocardial infarction) [I21.4] 10/29/2014  . Protein-calorie malnutrition, severe [E43] 10/29/2014  . 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 Fatigue [R53.83] 09/28/2014  . 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 Anorexia [R63.0] 09/12/2014  . COPD (chronic obstructive pulmonary disease) [J44.9] 09/12/2014  . GAD (generalized anxiety disorder) [F41.1]     Follow-up Information    Follow up with Ms. Juanita Craver, Therapist. Go in 7 days.   Why:  For follow-up care/ hospital followup for therapy on Tuesday 11/11/14 at 4:00pm   Contact information:   [place], [place] [address] Ph [phone] Fax [phone]      Follow up with [facility]-Dr. Jimmye Norman. Go in 9 days.   Why:  For follow-up care; hospital followup with Dr. Jimmye Norman on Thursday 11/13/14 at 1:30pm   Contact information:   [address] [place], [place] [address] Ph [phone] Fax [phone]       Follow up with [facility].   Why:  home health referral for nursing   Contact  information:   [address],  [place], [place] [address] Ph [phone] Fax [phone]      Plan Of Care/Follow-up recommendations:  Activity:  as tol;erated. Diet:  low sodium heart healthy. Other:  keep follow up appointments.  Is patient on multiple antipsychotic therapies at discharge:  No   Has Patient had three or more failed trials of  antipsychotic monotherapy by history:  No  Recommended Plan for Multiple Antipsychotic Therapies: NA    Jolanta Pucilowska 11/05/2014, 12:03 PM

## 2014-11-05 NOTE — Plan of Care (Signed)
Problem: Diagnosis: Increased Risk For Suicide Attempt Goal: LTG-Patient Will Report Improved Mood and Deny Suicidal LTG (by discharge) Patient will report improved mood and deny suicidal ideation.  Outcome: Progressing Patient denies SI/HI, interacting appropriately with peers and staff.

## 2014-11-05 NOTE — BHH Group Notes (Signed)
[facility] Group Notes:  (Nursing/MHT/Case Management/Adjunct)  Date:  11/05/2014  Time:  12:38 PM  Type of Therapy:  Psychoeducational Skills  Participation Level:  Did Not Attend   Adela Lank [facility] 11/05/2014, 12:38 PM
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
## 2014-11-05 NOTE — Progress Notes (Signed)
D: Pt denies SI/HI/AVH. Pt is pleasant and cooperative. Pt stated she feels better from taking her medication and interacting with peers, appears less anxious  A: Pt was offered support and encouragement. Pt was given scheduled medications. Pt was encouraged to attend groups. Q 15 minute checks were done for safety.  R:Pt did not attend group however was present for snack and interacted well with peers and staff. Pt is taking medication. Pt has no complaints.Pt receptive to treatment and safety maintained on unit.

## 2014-11-05 NOTE — Progress Notes (Signed)
D: Patient discharged with Family support person, all discharge instruction reviewed with patient and support person and signed by patieo.  Reviewed instruction for support person to dispose of old medication, new Rx's called into the pharmacy of choice.  Patient appeared to be very excited about going home. Keep asking staff "am I  really going to leave"," am I really going to go outside and get in the car".  A: Escorted patient in wheelchair to car, encouraged to keep follow-up appointments, and compliance with medications and treatments. R: Patient discharged

## 2014-11-05 NOTE — Progress Notes (Signed)
[facility] LCSW Aftercare Discharge Planning Group Note  11/05/2014 1:35 PM  Participation Quality:  DID NOT ATTEND  Affect:    Cognitive:    Insight:    Engagement in Group:    Modes of Intervention:    Summary of Progress/Problems:  Joana Reamer 11/05/2014, 1:35 PM

## 2014-11-05 NOTE — Plan of Care (Signed)
Problem: Consults Goal: Suicide Risk Patient Education (See Patient Education module for education specifics)  Outcome: Progressing Patient denies SI , and remains flat

## 2014-11-05 NOTE — Tx Team (Addendum)
Interdisciplinary Treatment Plan Update (Adult)  Date:  11/05/2014 Time Reviewed:  1:23 PM  Progress in Treatment: Attending groups: No. Participating in groups:  No. Taking medication as prescribed:  Yes. Tolerating medication:  Yes. Family/Significant othe contact made:  Yes, individual(s) contacted:  sons Patient understands diagnosis:  No. Discussing patient identified problems/goals with staff:  Yes. Medical problems stabilized or resolved:  Yes. Denies suicidal/homicidal ideation: Yes. Issues/concerns per patient self-inventory:  Yes. Other:  New problem(s) identified: No, Describe:     Discharge Plan or Barriers: Patient will discharge home with a friend [name] as discussed with her son, Fredonia Highland on 11/04/14. CSW attempted to contact patient's other son, [name] on 11/04/14 and left VM but unable to contact. Fredonia Highland was agreeable with plan for patient to discharge as early as tomorrow but at least within the week with home health and mental health services in place.   Reason for Continuation of Hospitalization: Anxiety Delusions  Depression Other; describe After care planning  Comments:Since 6/29 she has been refusing meds. She eventually takes them but needs significant encouragement from nursing.Ptient denies consistently having auditory or visual hallucinations. She is not seen interacting to internal stimuli. However strong delusional beliefs continue. There has not much improvement with anti-psychotics. [facility] bed declined on 09/15/2014, SW made new referral on 10/21/2014, called and confirmed received with intake staff, Janett Billow.  Estimated length of stay: 1 day  New goal(s):  Review of initial/current patient goals per problem list:   See Plan of Care  Attendees: Physician:  Orson Slick, MD  7/27/20161:23 PM  Nursing:   Floyde Parkins, RN 7/27/20161:23 PM  Other:  Dossie Arbour, LCSW 7/27/20161:23 PM  Other:  Carmell Austria, LCSWA 7/27/20161:23 PM  Other:  Everitt Amber, LRT  7/27/20161:23 PM  Other:  7/27/20161:23 PM  Other:  7/27/20161:23 PM  Other:  7/27/20161:23 PM  Other:  7/27/20161:23 PM  Other:  7/27/20161:23 PM  Other:  7/27/20161:23 PM  Other:   7/27/20161:23 PM   Scribe for Treatment Team:   [name], MSW, LCSWA 11/05/2014, 1:23 PM

## 2014-11-06 ENCOUNTER — Encounter: Payer: Self-pay | Admitting: *Deleted

## 2014-11-06 ENCOUNTER — Emergency Department
Admission: EM | Admit: 2014-11-06 | Discharge: 2014-11-06 | Disposition: A | Discharge status: Other Acute Inpatient Hospital | Payer: Medicare Other | Attending: Student | Admitting: Student

## 2014-11-06 ENCOUNTER — Inpatient Hospital Stay (HOSPITAL_COMMUNITY)
Admission: EM | Admit: 2014-11-06 | Discharge: 2014-11-15 | Disposition: A | Discharge status: Home / Self Care | Payer: Medicare Other | Source: Intra-hospital | Attending: Psychiatry | Admitting: Psychiatry

## 2014-11-06 DIAGNOSIS — Z811 Family history of alcohol abuse and dependence: Secondary | ICD-10-CM

## 2014-11-06 DIAGNOSIS — E785 Hyperlipidemia, unspecified: Secondary | ICD-10-CM

## 2014-11-06 DIAGNOSIS — E43 Unspecified severe protein-calorie malnutrition: Secondary | ICD-10-CM | POA: Diagnosis present

## 2014-11-06 DIAGNOSIS — Z955 Presence of coronary angioplasty implant and graft: Secondary | ICD-10-CM

## 2014-11-06 DIAGNOSIS — J029 Acute pharyngitis, unspecified: Secondary | ICD-10-CM

## 2014-11-06 DIAGNOSIS — I214 Non-ST elevation (NSTEMI) myocardial infarction: Secondary | ICD-10-CM | POA: Diagnosis present

## 2014-11-06 DIAGNOSIS — F411 Generalized anxiety disorder: Secondary | ICD-10-CM | POA: Diagnosis present

## 2014-11-06 DIAGNOSIS — G4700 Insomnia, unspecified: Secondary | ICD-10-CM | POA: Diagnosis present

## 2014-11-06 DIAGNOSIS — F332 Major depressive disorder, recurrent severe without psychotic features: Secondary | ICD-10-CM

## 2014-11-06 DIAGNOSIS — Z720 Tobacco use: Secondary | ICD-10-CM

## 2014-11-06 DIAGNOSIS — Z8249 Family history of ischemic heart disease and other diseases of the circulatory system: Secondary | ICD-10-CM

## 2014-11-06 DIAGNOSIS — F329 Major depressive disorder, single episode, unspecified: Secondary | ICD-10-CM | POA: Diagnosis not present

## 2014-11-06 DIAGNOSIS — Z9049 Acquired absence of other specified parts of digestive tract: Secondary | ICD-10-CM | POA: Diagnosis present

## 2014-11-06 DIAGNOSIS — Z801 Family history of malignant neoplasm of trachea, bronchus and lung: Secondary | ICD-10-CM

## 2014-11-06 DIAGNOSIS — F061 Catatonic disorder due to known physiological condition: Secondary | ICD-10-CM | POA: Diagnosis present

## 2014-11-06 DIAGNOSIS — K5900 Constipation, unspecified: Secondary | ICD-10-CM | POA: Diagnosis present

## 2014-11-06 DIAGNOSIS — I2510 Atherosclerotic heart disease of native coronary artery without angina pectoris: Secondary | ICD-10-CM | POA: Diagnosis present

## 2014-11-06 DIAGNOSIS — I252 Old myocardial infarction: Secondary | ICD-10-CM

## 2014-11-06 DIAGNOSIS — Z7982 Long term (current) use of aspirin: Secondary | ICD-10-CM | POA: Insufficient documentation

## 2014-11-06 DIAGNOSIS — R45851 Suicidal ideations: Secondary | ICD-10-CM

## 2014-11-06 DIAGNOSIS — Z808 Family history of malignant neoplasm of other organs or systems: Secondary | ICD-10-CM

## 2014-11-06 DIAGNOSIS — F1310 Sedative, hypnotic or anxiolytic abuse, uncomplicated: Secondary | ICD-10-CM | POA: Insufficient documentation

## 2014-11-06 DIAGNOSIS — K219 Gastro-esophageal reflux disease without esophagitis: Secondary | ICD-10-CM | POA: Diagnosis present

## 2014-11-06 DIAGNOSIS — F333 Major depressive disorder, recurrent, severe with psychotic symptoms: Secondary | ICD-10-CM | POA: Diagnosis present

## 2014-11-06 DIAGNOSIS — Z681 Body mass index (BMI) 19 or less, adult: Secondary | ICD-10-CM

## 2014-11-06 DIAGNOSIS — R51 Headache: Secondary | ICD-10-CM | POA: Diagnosis present

## 2014-11-06 DIAGNOSIS — Z90710 Acquired absence of both cervix and uterus: Secondary | ICD-10-CM

## 2014-11-06 DIAGNOSIS — F22 Delusional disorders: Secondary | ICD-10-CM | POA: Diagnosis present

## 2014-11-06 DIAGNOSIS — Z79899 Other long term (current) drug therapy: Secondary | ICD-10-CM | POA: Insufficient documentation

## 2014-11-06 DIAGNOSIS — Z87891 Personal history of nicotine dependence: Secondary | ICD-10-CM | POA: Insufficient documentation

## 2014-11-06 DIAGNOSIS — J449 Chronic obstructive pulmonary disease, unspecified: Secondary | ICD-10-CM | POA: Diagnosis present

## 2014-11-06 DIAGNOSIS — R64 Cachexia: Secondary | ICD-10-CM | POA: Diagnosis present

## 2014-11-06 DIAGNOSIS — R630 Anorexia: Secondary | ICD-10-CM

## 2014-11-06 LAB — CBC
HEMATOCRIT: 43.6 % (ref 35.0–47.0)
Hemoglobin: 14.5 g/dL (ref 12.0–16.0)
MCH: 31.6 pg (ref 26.0–34.0)
MCHC: 33.3 g/dL (ref 32.0–36.0)
MCV: 94.8 fL (ref 80.0–100.0)
Platelets: 294 10*3/uL (ref 150–440)
RBC: 4.6 MIL/uL (ref 3.80–5.20)
RDW: 13.4 % (ref 11.5–14.5)
WBC: 13 10*3/uL — ABNORMAL HIGH (ref 3.6–11.0)

## 2014-11-06 LAB — COMPREHENSIVE METABOLIC PANEL
ALT: 71 U/L — AB (ref 14–54)
AST: 30 U/L (ref 15–41)
Albumin: 4 g/dL (ref 3.5–5.0)
Alkaline Phosphatase: 54 U/L (ref 38–126)
Anion gap: 9 (ref 5–15)
BUN: 25 mg/dL — ABNORMAL HIGH (ref 6–20)
CALCIUM: 9.5 mg/dL (ref 8.9–10.3)
CO2: 27 mmol/L (ref 22–32)
Chloride: 103 mmol/L (ref 101–111)
Creatinine, Ser: 0.63 mg/dL (ref 0.44–1.00)
GFR calc Af Amer: >60 mL/min (ref >60)
GLUCOSE: 113 mg/dL — AB (ref 65–99)
POTASSIUM: 3.9 mmol/L (ref 3.5–5.1)
Sodium: 139 mmol/L (ref 135–145)
Total Bilirubin: 0.4 mg/dL (ref 0.3–1.2)
Total Protein: 7.4 g/dL (ref 6.5–8.1)

## 2014-11-06 LAB — SALICYLATE LEVEL

## 2014-11-06 LAB — ACETAMINOPHEN LEVEL

## 2014-11-06 LAB — URINE DRUG SCREEN, QUALITATIVE (ARMC ONLY)
Amphetamines, Ur Screen: NOT DETECTED
Barbiturates, Ur Screen: NOT DETECTED
Benzodiazepine, Ur Scrn: POSITIVE — AB
COCAINE METABOLITE, UR ~~LOC~~: NOT DETECTED
Cannabinoid 50 Ng, Ur ~~LOC~~: NOT DETECTED
MDMA (Ecstasy)Ur Screen: NOT DETECTED
METHADONE SCREEN, URINE: NOT DETECTED
Opiate, Ur Screen: NOT DETECTED
Phencyclidine (PCP) Ur S: NOT DETECTED
TRICYCLIC, UR SCREEN: NOT DETECTED

## 2014-11-06 LAB — ETHANOL

## 2014-11-06 MED ORDER — BACITRACIN ZINC 500 UNIT/GM EX OINT
TOPICAL_OINTMENT | Freq: Two times a day (BID) | CUTANEOUS | Status: DC
  Administered 2014-11-06: 20:00:00 via TOPICAL
  Filled 2014-11-06: qty 28.35

## 2014-11-06 MED ORDER — DOCUSATE SODIUM 100 MG PO CAPS
200.0000 mg | ORAL_CAPSULE | Freq: Two times a day (BID) | ORAL | Status: DC
  Administered 2014-11-07 – 2014-11-14 (×16): 200 mg via ORAL
  Filled 2014-11-06 (×17): qty 2

## 2014-11-06 MED ORDER — FLUVOXAMINE MALEATE 50 MG PO TABS
50.0000 mg | ORAL_TABLET | Freq: Every day | ORAL | Status: DC
  Administered 2014-11-06: 50 mg via ORAL
  Filled 2014-11-06: qty 1

## 2014-11-06 MED ORDER — ALPRAZOLAM 0.25 MG PO TABS
ORAL_TABLET | ORAL | Status: AC
  Administered 2014-11-06: 0.25 mg via ORAL
  Filled 2014-11-06: qty 1

## 2014-11-06 MED ORDER — MIRTAZAPINE 30 MG PO TABS
45.0000 mg | ORAL_TABLET | Freq: Every day | ORAL | Status: DC
  Administered 2014-11-06 – 2014-11-14 (×9): 45 mg via ORAL
  Filled 2014-11-06 (×9): qty 1

## 2014-11-06 MED ORDER — BACITRACIN ZINC 500 UNIT/GM EX OINT
TOPICAL_OINTMENT | Freq: Two times a day (BID) | CUTANEOUS | Status: DC
  Administered 2014-11-07 – 2014-11-10 (×4): 15.5556 via TOPICAL
  Administered 2014-11-10: 22:00:00 via TOPICAL
  Administered 2014-11-11: 15.5556 via TOPICAL
  Administered 2014-11-12: 22:00:00 via TOPICAL
  Administered 2014-11-12 – 2014-11-14 (×5): 15.5556 via TOPICAL
  Filled 2014-11-06 (×3): qty 28.35

## 2014-11-06 MED ORDER — ALPRAZOLAM 0.25 MG PO TABS
0.2500 mg | ORAL_TABLET | Freq: Once | ORAL | Status: AC
  Administered 2014-11-06: 0.25 mg via ORAL

## 2014-11-06 MED ORDER — MAGNESIUM HYDROXIDE 400 MG/5ML PO SUSP: 30.0000 mL | Freq: Every day | ORAL | Status: DC | PRN

## 2014-11-06 MED ORDER — PANTOPRAZOLE SODIUM 40 MG PO TBEC
40.0000 mg | DELAYED_RELEASE_TABLET | Freq: Every day | ORAL | Status: DC
  Administered 2014-11-06 – 2014-11-14 (×9): 40 mg via ORAL
  Filled 2014-11-06 (×8): qty 1

## 2014-11-06 MED ORDER — ACETAMINOPHEN 325 MG PO TABS
650.0000 mg | ORAL_TABLET | Freq: Four times a day (QID) | ORAL | Status: DC | PRN
  Administered 2014-11-11: 650 mg via ORAL
  Filled 2014-11-06: qty 2

## 2014-11-06 MED ORDER — ALUM & MAG HYDROXIDE-SIMETH 200-200-20 MG/5ML PO SUSP: 30.0000 mL | ORAL | Status: DC | PRN

## 2014-11-06 MED ORDER — ASPIRIN EC 81 MG PO TBEC
81.0000 mg | DELAYED_RELEASE_TABLET | Freq: Every day | ORAL | Status: DC
  Administered 2014-11-06 – 2014-11-14 (×9): 81 mg via ORAL
  Filled 2014-11-06 (×9): qty 1

## 2014-11-06 MED ORDER — METOPROLOL TARTRATE 25 MG PO TABS
12.5000 mg | ORAL_TABLET | Freq: Two times a day (BID) | ORAL | Status: DC
  Administered 2014-11-06 – 2014-11-14 (×16): 12.5 mg via ORAL
  Administered 2014-11-14: 25 mg via ORAL
  Filled 2014-11-06 (×17): qty 1

## 2014-11-06 MED ORDER — ALPRAZOLAM 0.25 MG PO TABS
0.2500 mg | ORAL_TABLET | Freq: Three times a day (TID) | ORAL | Status: DC
  Administered 2014-11-06 – 2014-11-14 (×25): 0.25 mg via ORAL
  Filled 2014-11-06 (×25): qty 1

## 2014-11-06 MED ORDER — ADULT MULTIVITAMIN W/MINERALS CH
1.0000 | ORAL_TABLET | Freq: Every day | ORAL | Status: DC
  Administered 2014-11-06 – 2014-11-14 (×9): 1 via ORAL
  Filled 2014-11-06 (×8): qty 1

## 2014-11-06 MED ORDER — CLOZAPINE 25 MG PO TABS
75.0000 mg | ORAL_TABLET | Freq: Every day | ORAL | Status: DC
  Administered 2014-11-06 – 2014-11-09 (×4): 75 mg via ORAL
  Filled 2014-11-06 (×4): qty 3

## 2014-11-06 MED ORDER — NITROGLYCERIN 0.4 MG SL SUBL: 0.4000 mg | SUBLINGUAL_TABLET | SUBLINGUAL | Status: DC | PRN

## 2014-11-06 MED ORDER — ATORVASTATIN CALCIUM 20 MG PO TABS
20.0000 mg | ORAL_TABLET | Freq: Every day | ORAL | Status: DC
  Administered 2014-11-07 – 2014-11-14 (×8): 20 mg via ORAL
  Filled 2014-11-06 (×8): qty 1

## 2014-11-06 NOTE — BH Assessment (Addendum)
Tele Assessment Note   Melissa George is an 64 y.o. female who presents to [facility] ED after being discharged from [place] yesterday (11/05/2014). Patient states that she was dropped off by her friend Melissa George "because she said she just couldn't handle me," I need around the clock care." Patient states that she is depressed but was unable to provide specific symptoms of depression. When asked, patient states that she isolates herself from others, she sleeps "about a half hour" per night, her apetite is "alright" and she has trouble concentrating. Patient states that she feels hopeless. Patient states that she has never had a Psychiatrist but has an appointment scheduled with Dr. Jimmye Norman "next week." Counselor confirmed patient has an appointment scheduled with Dr. Jimmye Norman at [facility] on November 13, 2014 at 1330. Patient states that she previously had an outpatient therapist by the name of "Katha Cabal" that she saw until recently. Patient did not provide an explanation for discontinuing outpatient therapy services. When asked about her recent discharge patient was unable to identify what changed overnight that made her feel that she needed to return to the hospital. Patient denies violent history and history of trauma/abuse. Patient denies access to weapons/firearms.  Patient denies self injurious behavior and SI/HI and Psychosis.   Patient was alert and oriented to time, person, place, and situation. Patient was unable to recall the exact date but was aware of the month and year. Patient was calm during the assessment and spoke in a calm voice. Patient was cooperative. Patient states "I don't know much of anything" and was prompted to answer several questions. Patient made poor eye contact and appeared to have compulsive behavior with rubbing her face. Patient rubbed her face throughout the assessment. Patient states that she slept about "a half hour last night" and did not note any  changes in her apetite. Patient presented with a flat affect and her mood and affect were congruent.    Counselor contacted patient support Melissa George at [phone] for collateral information. Melissa George states that she brought the patient back to the hospital due to the patient refusing to pick up her prescriptions. Melissa George states that the patient had "hot flashes" and "wanted to be up under me" and states that she "can't take it." Melissa George states that patient declined to take her medications due to not having the money but Melissa George "knows she has the money." Melissa George states that the patient is unable to return and she feels that she needs "around the clock care." Melissa George states that she has all of the patient belongings and will return them when she comes to visit the patient.    Patient disposition pending psychiatry consult.   Axis I: Major Depression, Recurrent severe Axis II: Deferred Axis III:  Past Medical History  Diagnosis Date  . Anxiety   . COPD (chronic obstructive pulmonary disease)   . Anxiety   . 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 Anxiety   . COPD (chronic obstructive pulmonary disease)   . Anxiety   . 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 Hand surgery    . Appendectomy    . Abdominal hysterectomy    . Cesarean section    . Cardiac catheterization N/A 10/31/2014  Procedure: Left Heart Cath and Coronary Angiography;  Surgeon: Isaias Cowman, MD;  Location: [facility];  Service: Cardiovascular;  Laterality: N/A;    Family History:  Family History  Problem Relation Age of Onset  . CAD Mother   . Thyroid cancer Other   . Lung cancer Other     Social History:  reports that she quit smoking about 7 weeks ago. Her smoking  use included Cigarettes. She has a 3.75 pack-year smoking history. She has never used smokeless tobacco. She reports that she does not drink alcohol or use illicit drugs.  Additional Social History:  Alcohol / Drug Use Pain Medications: See PTA Prescriptions: See PTA Over the Counter: See PTA History of alcohol / drug use?: No history of alcohol / drug abuse  CIWA: CIWA-Ar BP: (!) 146/100 mmHg Pulse Rate: (!) 105 COWS:    PATIENT STRENGTHS: (choose at least two) Armed forces logistics/support/administrative officer Motivation for treatment/growth  Allergies:  Allergies  Allergen Reactions  . Ciprofloxacin Shortness Of Breath and Itching  . Levofloxacin Other (See Comments)    Reaction:  Unknown   . Morphine And Related Nausea And Vomiting  . Sulfa Antibiotics Hives and Itching  . Symbicort [Budesonide-Formoterol Fumarate] Other (See Comments)    Reaction:  Psychotic episode  . Advair Diskus [Fluticasone-Salmeterol] Anxiety    Home Medications:  (Not in a hospital admission)  OB/GYN Status:  No LMP recorded. Patient is postmenopausal.  General Assessment Data Location of Assessment: [facility] ED TTS Assessment: In system Is this a Tele or Face-to-Face Assessment?: Tele Assessment Is this an Initial Assessment or a Re-assessment for this encounter?: Initial Assessment Marital status: Divorced [place] name: Melissa George Is patient pregnant?: No Pregnancy Status: No Living Arrangements: Non-relatives/Friends Artist) Can pt return to current living arrangement?: Yes Admission Status: Voluntary Is patient capable of signing voluntary admission?: Yes Referral Source: Self/Family/Friend Insurance type: [place] Living Arrangements: Non-relatives/Friends Artist) Name of Psychiatrist: Dr. Jimmye Norman  Education Status Is patient currently in school?: No Highest grade of school patient has completed: 12th  Risk to self with the past 6 months Suicidal Ideation: No Has patient been a risk to self  within the past 6 months prior to admission? : Yes Suicidal Intent: No Has patient had any suicidal intent within the past 6 months prior to admission? : No Is patient at risk for suicide?: No Suicidal Plan?: No Has patient had any suicidal plan within the past 6 months prior to admission? : No Specify Current Suicidal Plan: Denies Access to Means: No Specify Access to Suicidal Means: Denies What has been your use of drugs/alcohol within the last 12 months?: Denies Previous Attempts/Gestures: No How many times?: 0 Other Self Harm Risks: Denies Triggers for Past Attempts: None known Intentional Self Injurious Behavior: None Family Suicide History: No Persecutory voices/beliefs?: No Depression: Yes Depression Symptoms: Insomnia, Isolating, Fatigue, Loss of interest in usual pleasures Substance abuse history and/or treatment for substance abuse?: No  Risk to Others within the past 6 months Homicidal Ideation: No Does patient have any lifetime risk of violence toward others beyond the six months prior to admission? : No Thoughts of Harm to Others: No Current Homicidal Intent: No Current Homicidal Plan: No Access to Homicidal Means: No Identified Victim: Denies History of harm to others?: No Assessment of Violence: None Noted Violent Behavior Description: Denies Does patient have access to weapons?: No Criminal Charges Pending?: No Does patient have a court date: No Is patient on  probation?: No  Psychosis Hallucinations: None noted Delusions: None noted  Mental Status Report Appearance/Hygiene: In scrubs Eye Contact: Poor Motor Activity: Unable to assess (Sitting down) Speech: Logical/coherent, Soft, Slow Level of Consciousness: Alert Mood: Depressed Affect: Flat Anxiety Level: None Thought Processes: Coherent, Relevant Judgement: Unimpaired Orientation: Person, Place, Time, Situation Obsessive Compulsive Thoughts/Behaviors: Moderate (patient continued to touch  face)  Cognitive Functioning Concentration: Decreased Memory: Recent Intact, Remote Intact IQ: Average Insight: Poor Impulse Control: Fair Appetite: Fair Weight Loss: 0 Weight Gain: 0 Sleep: Decreased Total Hours of Sleep: 1 Vegetative Symptoms: Not bathing, Decreased grooming  ADLScreening [facility] Assessment Services) Patient's cognitive ability adequate to safely complete daily activities?: Yes Patient able to express need for assistance with ADLs?: Yes Independently performs ADLs?: Yes (appropriate for developmental age)  Prior Inpatient Therapy Prior Inpatient Therapy: Yes Prior Therapy Dates: 10/2014-, 07/2014 Prior Therapy Facilty/Provider(s): [facility], [facility],  Reason for Treatment: Depression  Prior Outpatient Therapy Prior Outpatient Therapy: No Prior Therapy Dates: N/A Prior Therapy Facilty/Provider(s): N/A Reason for Treatment: N/A Does patient have an ACCT team?: No Does patient have Intensive In-House Services?  : No Does patient have [facility] services? : No Does patient have P4CC services?: No  ADL Screening (condition at time of admission) Patient's cognitive ability adequate to safely complete daily activities?: Yes Patient able to express need for assistance with ADLs?: Yes Independently performs ADLs?: Yes (appropriate for developmental age)       Abuse/Neglect Assessment (Assessment to be complete while patient is alone) Physical Abuse: Denies Verbal Abuse: Denies Sexual Abuse: Denies Exploitation of patient/patient's resources: Denies Self-Neglect: Denies Values / Beliefs Cultural Requests During Hospitalization: None Spiritual Requests During Hospitalization: None Consults Spiritual Care Consult Needed: No Social Work Consult Needed: No Regulatory affairs officer (For Healthcare) Does patient have an advance directive?: Yes    Additional Information 1:1 In Past 12 Months?: No CIRT Risk: No Elopement Risk: No Does patient have medical clearance?: Yes      Disposition:  Disposition Initial Assessment Completed for this Encounter: Yes Disposition of Patient: Other dispositions Other disposition(s): Other (Comment) Patient referred to:  (Pending psych consult)  Majestic Brister 11/06/2014 4:08 PM

## 2014-11-06 NOTE — ED Notes (Signed)
BEHAVIORAL HEALTH ROUNDING Patient sleeping: No. Patient alert and oriented: no Behavior appropriate: Yes.  ; If no, describe:  Nutrition and fluids offered: Yes  Toileting and hygiene offered: Yes  Sitter present: not applicable Law enforcement present: Yes

## 2014-11-06 NOTE — ED Notes (Signed)
Pt was discharged yesterday from the Behavioral Med Unit for depression and paranoia, pt refuses to take medications

## 2014-11-06 NOTE — ED Notes (Signed)
Pt in 20 hallway until discharges allow pt to move into 20.  Pt is sitting on the side of the stretcher, drinking water.  She is alert and oriented.  Interactive with staff in the hallway.  Pt continuously rubs her left eyebrow area.  Area is red.  RN suggested a glove to help pt stop rubbing her skin.  Pt has stopped.

## 2014-11-06 NOTE — Clinical Social Work Note (Signed)
CSW met with patient, her friend [name] whom she will discharge home to, and included patient's sons in a conference call for the family meeting, Fredonia [place] [phone] and [name] [phone]. [name] will support patient and take her to appointments and discharge plan is discussed with family that patient will see Dr. Jimmye Norman at [facility]. and her therapist Ms. Juanita Craver next week and is referred to [facility] health at [facility]. Patient's sons were concerned about medication schedule and MD explained this schedule. Patient states she is ready to go home as she has been in the hospital long enough. Family felt better about the discharge after the meeting. Patient's son Fredonia Highland has been supportive but [name] has been concerned and questioned patient's discharge but is now ok with the plan. Patient's friend wants what patient wants and will support her anyway she can.

## 2014-11-06 NOTE — ED Notes (Signed)
Pt's son, Miroslava Santellan [phone], called to check on his mother.   [name] lives in [place] and his brother lives in [place].  The two brothers are medical POAs for the pt.  [name] has requested that he be kept informed of status changes/disposition with his mother.  He and his brother are trying to find an assisted living facility for their mother.   [name] said that this behavior of his mother's just started about nine months ago.  Prior to that, his mother's behavior was normal.

## 2014-11-06 NOTE — ED Provider Notes (Signed)
[facility] Emergency Department Provider Note  ____________________________________________  Time seen: 2:20 PM  I have reviewed the triage vital signs and the nursing notes.   HISTORY  Chief Complaint Psychiatric Evaluation    HPI Melissa George is a 64 y.o. female who complains of suicidal ideation. She notes that she was recently admitted to inpatient psychiatry, discharge yesterday, but she was not able to fill her prescriptions. When asked why she is unable to state why, but when prompted with recent such as transportation or lack of money, she agrees with both of these issues. She denies any other acute symptoms. No chest pain shortness of breath fever chills nausea vomiting. She does feel very depressed and anxious. No plan to harm and herself at this time, no homicidal ideations or hallucinations.     Past Medical History  Diagnosis Date  . Anxiety   . COPD (chronic obstructive pulmonary disease)   . Anxiety   . 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 NSTEMI (non-ST elevated myocardial infarction) 10/29/2014  . Protein-calorie malnutrition, severe 10/29/2014  . Major depressive disorder, recurrent episode, severe with catatonia 10/24/2014  . Fatigue 09/28/2014  . Delusional disorder, persecutory type 09/24/2014  . Anorexia 09/12/2014  . COPD (chronic obstructive pulmonary disease) 09/12/2014  . GAD (generalized anxiety disorder)     Past Surgical History  Procedure Laterality Date  . Hand surgery    . Appendectomy    . Abdominal hysterectomy    . Cesarean section    . Cardiac catheterization N/A 10/31/2014    Procedure: Left Heart Cath and Coronary Angiography;  Surgeon: Isaias Cowman, MD;  Location: [place] [facility];  Service: Cardiovascular;  Laterality: N/A;    Current Outpatient Rx  Name  Route  Sig  Dispense  Refill  . ALPRAZolam (XANAX) 0.25 MG tablet   Oral   Take 1  tablet (0.25 mg total) by mouth 3 (three) times daily with meals.   90 tablet   0   . aspirin 81 MG chewable tablet   Oral   Chew 1 tablet (81 mg total) by mouth before cath procedure.   30 tablet   0   . atorvastatin (LIPITOR) 20 MG tablet   Oral   Take 1 tablet (20 mg total) by mouth daily at 6 PM.   30 tablet   0   . cholecalciferol (D-VI-SOL) 400 UNIT/ML LIQD   Oral   Take 1 mL (400 Units total) by mouth daily with breakfast.   30 mL   0   . cloZAPine (CLOZARIL) 25 MG tablet   Oral   Take 2 tablets (50 mg total) by mouth at bedtime.   60 tablet   0   . docusate sodium (COLACE) 100 MG capsule   Oral   Take 2 capsules (200 mg total) by mouth 2 (two) times daily.   120 capsule   0   . fluvoxaMINE (LUVOX) 50 MG tablet   Oral   Take 1 tablet (50 mg total) by mouth at bedtime.   30 tablet   0   . megestrol (MEGACE) 400 MG/10ML suspension   Oral   Take 20 mLs (800 mg total) by mouth at bedtime.   240 mL   0   . metoprolol tartrate (LOPRESSOR) 25 MG tablet   Oral   Take 0.5 tablets (12.5 mg total) by mouth 2 (two) times daily.  60 tablet   0   . mirtazapine (REMERON) 45 MG tablet   Oral   Take 1 tablet (45 mg total) by mouth at bedtime.   30 tablet   0   . Multiple Vitamin (MULTIVITAMIN WITH MINERALS) TABS tablet   Oral   Take 1 tablet by mouth daily with breakfast.   30 tablet   0   . nitroGLYCERIN (NITROSTAT) 0.4 MG SL tablet   Sublingual   Place 1 tablet (0.4 mg total) under the tongue every 5 (five) minutes as needed for chest pain.   20 tablet   2   . pantoprazole (PROTONIX) 40 MG tablet   Oral   Take 1 tablet (40 mg total) by mouth daily.   30 tablet   0   . senna (SENOKOT) 8.6 MG TABS tablet   Oral   Take 2 tablets (17.2 mg total) by mouth daily.   60 each   0   . tiotropium (SPIRIVA) 18 MCG inhalation capsule   Inhalation   Place 1 capsule (18 mcg total) into inhaler and inhale daily.   30 capsule   12      Allergies Ciprofloxacin; Levofloxacin; Morphine and related; Sulfa antibiotics; Symbicort; and Advair diskus  Family History  Problem Relation Age of Onset  . CAD Mother   . Thyroid cancer Other   . Lung cancer Other     Social History History  Substance Use Topics  . Smoking status: Former Smoker -- 0.25 packs/day for 15 years    Types: Cigarettes    Quit date: 09/13/2014  . Smokeless tobacco: Never Used  . Alcohol Use: No    Review of Systems  Constitutional: No fever or chills. No weight changes Eyes:No blurry vision or double vision.  ENT: No sore throat. Cardiovascular: No chest pain. Respiratory: No dyspnea or cough. Gastrointestinal: Negative for abdominal pain, vomiting and diarrhea.  No BRBPR or melena. Genitourinary: Negative for dysuria, urinary retention, bloody urine, or difficulty urinating. Musculoskeletal: Negative for back pain. No joint swelling or pain. Skin: Negative for rash. Neurological: Negative for headaches, focal weakness or numbness. Psychiatric: Depressed mood and suicidal ideation.   Endocrine:No hot/cold intolerance, changes in energy, or sleep difficulty.  10-point ROS otherwise negative.  ____________________________________________   PHYSICAL EXAM:  VITAL SIGNS: ED Triage Vitals  Enc Vitals Group     BP 11/06/14 1256 146/100 mmHg     Pulse Rate 11/06/14 1256 105     Resp 11/06/14 1256 20     Temp 11/06/14 1256 97.7 F (36.5 C)     Temp Source 11/06/14 1256 Oral     SpO2 11/06/14 1256 93 %     Weight 11/06/14 1256 90 lb (40.824 kg)     Height 11/06/14 1256 '5\' 1"'$  (1.549 m)     Head Cir --      Peak Flow --      Pain Score --      Pain Loc --      Pain Edu? --      Excl. in [place]? --      Constitutional: Alert and oriented. Well appearing and in no distress. Eyes: No scleral icterus. No conjunctival pallor. PERRL. EOMI ENT   Head: Normocephalic and atraumatic.   Nose: No congestion/rhinnorhea. No septal  hematoma   Mouth/Throat: MMM, no pharyngeal erythema. No peritonsillar mass. No uvula shift.   Neck: No stridor. No SubQ emphysema. No meningismus. Hematological/Lymphatic/Immunilogical: No cervical lymphadenopathy. Cardiovascular: RRR. Normal and symmetric distal pulses  are present in all extremities. No murmurs, rubs, or gallops. Respiratory: Normal respiratory effort without tachypnea nor retractions. Breath sounds are clear and equal bilaterally. No wheezes/rales/rhonchi. Gastrointestinal: Soft and nontender. No distention. There is no CVA tenderness.  No rebound, rigidity, or guarding. Genitourinary: deferred Musculoskeletal: Nontender with normal range of motion in all extremities. No joint effusions.  No lower extremity tenderness.  No edema. Neurologic:   Normal speech and language.  CN 2-10 normal. Motor grossly intact. No pronator drift.  Normal gait. No gross focal neurologic deficits are appreciated.  Skin:  Skin is warm, dry and intact. No rash noted.  No petechiae, purpura, or bullae. Psychiatric: Depressed mood, suicidal ideation..  ____________________________________________    LABS (pertinent positives/negatives) (all labs ordered are listed, but only abnormal results are displayed) Labs Reviewed  COMPREHENSIVE METABOLIC PANEL - Abnormal; Notable for the following:    Glucose, Bld 113 (*)    BUN 25 (*)    ALT 71 (*)    All other components within normal limits  ACETAMINOPHEN LEVEL - Abnormal; Notable for the following:    Acetaminophen (Tylenol), Serum <10 (*)    All other components within normal limits  CBC - Abnormal; Notable for the following:    WBC 13.0 (*)    All other components within normal limits  ETHANOL  SALICYLATE LEVEL  URINE DRUG SCREEN, QUALITATIVE ([facility] ONLY)  CLOZAPINE (CLOZARIL)   ____________________________________________   EKG    ____________________________________________     RADIOLOGY    ____________________________________________   PROCEDURES  ____________________________________________   INITIAL IMPRESSION / ASSESSMENT AND PLAN / ED COURSE  Pertinent labs & imaging results that were available during my care of the patient were reviewed by me and considered in my medical decision making (see chart for details).  Patient well appearing no acute distress. Medically stable. No current criteria for involuntary commitment and not a danger to herself or others at the moment. I suspect that her presentation is due to difficulty obtaining her medications and that she would like assistance obtaining her medications. We'll arrange for evaluation by behavioral medicine and psychiatry. Keep the patient in the ED voluntarily.  ____________________________________________   FINAL CLINICAL IMPRESSION(S) / ED DIAGNOSES  Final diagnoses:  Suicidal ideation      Carrie Mew, MD 11/06/14 [phone]

## 2014-11-06 NOTE — BHH Counselor (Signed)
Contacted Ian Malkin at [phone] to inform him that assessment is complete.

## 2014-11-06 NOTE — Progress Notes (Signed)
AVS H&P Discharge Summary faxed to Ms.Juanita Craver Animator)

## 2014-11-06 NOTE — ED Notes (Signed)
Transfer to [facility]

## 2014-11-06 NOTE — ED Notes (Signed)
Report given to gina on bh unit. Pt to go to 302

## 2014-11-06 NOTE — BH Assessment (Signed)
Counselor contacted patient friend [name] at [phone]. [name] states that the patient was "terrible" and "wouldn't take her medicine." [name] states that she told the patient that if she did not take her medications she would take her back to the hospital and patients states "okay." [name] states that "she just got money to get her medicine and won't take it." [name] states that she "used half a tank of gas" taking the patient to [facility] for her medications and she decided to take her back to the hospital. [name] states that the patient wanted to be close to her when she was discharged and [name] states that she needs "around the clock care." [name] states that she "wants me right up under her all the time and I got to [place], my yard looks terrible."   [name] states that she is unable to care for the patient because she is unable to care for her. [name] states that she wanted to give it a try and it is not working out at all. [name] states that the patient is having "severe hot flashes."   [name] states that patient reports that she does not want to take her medicine because she "can't afford it" but [name] knows that she has a debit card with the money on it to pay for the medications.    [name] states that the patient did not threaten to hurt herself or anyone else.  [name] states that patient is not hearing or seeing things that she cannot hear or see but she is afraid to stay in the room by herself.    [name] states that the patient states that she is going to jail. [name] states that the patient wanted to stop at places "so she could not have to get back in the car, that's the reason she wanted to stop by her house so she wouldn't have to get back in the car. [name] states that after that the patient tried to open the car door but she told the patient to close the door and she closed the door.    Relayed this information to Ian Malkin, TTS Counselor  Rosalin Hawking, LCSW Therapeutic Triage Specialist [place] 11/06/2014 4:26 PM

## 2014-11-06 NOTE — ED Notes (Signed)
Pt moved to 20.  TV adjusted for her.  Oriented to area.  Pt repeats questions numerous times.

## 2014-11-06 NOTE — ED Notes (Signed)
Pt brought over to bhu while awaiting bed in beh med unti

## 2014-11-06 NOTE — ED Notes (Addendum)
Malachy Mood, [facility], called about pt.  [facility] is the home health agency that cares for pt.  Malachy Mood was scheduled to visit pt in her home today, but learned that pt was back at [facility].  She requests that she be called [phone] when pt is to be released and informed when and where pt is going so that she might follow-through with pt care.

## 2014-11-06 NOTE — Progress Notes (Signed)
  [facility] Adult Case Management Discharge Plan :  Will you be returning to the same living situation after discharge:  No. Patient will be living with her friend in [place] temporarily. At discharge, do you have transportation home?: Yes,  patient's friend, [name], will take patient home after family meeting Do you have the ability to pay for your medications: Yes,  patient has insurance  Release of information consent forms completed and in the chart;  Patient's signature needed at discharge.  Patient to Follow up at: Follow-up Information    Follow up with Ms. Juanita Craver, Therapist. Go in 7 days.   Why:  For follow-up care/ hospital followup for therapy on Tuesday 11/11/14 at 4:00pm   Contact information:   [place], [place] [address] Ph [phone] Fax [phone]      Follow up with [facility]-Dr. Jimmye Norman. Go in 9 days.   Why:  For follow-up care; hospital followup with Dr. Jimmye Norman on Thursday 11/13/14 at 1:30pm   Contact information:   [address], [place] [address] Ph [phone] Fax [phone]       Follow up with [facility].   Why:  home health referral for nursing   Contact information:   [place],  [place], [place] [address] Ph [phone] Fax [phone]      Patient denies SI/HI: Yes,  patient denies SI/HI    Safety Planning and Suicide Prevention discussed: Yes,  SPE discussed with patient and her friend [name] as well as her sons, [name] and Fredonia Highland     Has patient been referred to the Quitline?: N/A patient is not a smoker  [name], MSW, LCSWA  11/06/2014, 9:19 AM

## 2014-11-06 NOTE — ED Notes (Signed)

## 2014-11-06 NOTE — ED Notes (Signed)
Pt had two iv sites listed from last week - verified they were not in pt and d/c'd them from the computer.

## 2014-11-06 NOTE — BHH Counselor (Signed)
Counselor attempted to contact patients son Tyrianna Lightle at [phone] to inform him that [name] is unable to care for the patient and asked if there was another person who could care for the patient. [name] states that the patient does not have any other supports and will have to "be released to her own care." [name] states that the patient would not have transportation to get home.  [name] states that he "cannot just jump on a plane tomorrow" to come and get the patient and he lives in [place] and his brother lives in [place] and they do not have the means to pick the patient up from the hospital.  [name] states that he was hopeful that the "hospital team" would make arrangements for his mother to be discharged "somewhere" and has requested that the psychiatrist call him with a disposition.This Probation officer informed [name] that she was calling for collateral information and no disposition has been made at this time. This Probation officer informed [name] that once the Psychiatrist sees the patient he/she will make a final disposition for the patient.    [name] requested that someone call him at [phone] once a decision has been made and he be updated.   Counselor relayed this information to [employer] in TTS in [place].   Rosalin Hawking, LCSW Therapeutic Triage Specialist [place] 11/06/2014 4:57 PM

## 2014-11-06 NOTE — BHH Suicide Risk Assessment (Signed)
[facility] INPATIENT:  Family/Significant Other Suicide Prevention Education  Suicide Prevention Education:  Education Completed; Chad Cordial (friend) [phone] has been identified by the patient as the family member/significant other with whom the patient will be residing, and identified as the person(s) who will aid the patient in the event of a mental health crisis (suicidal ideations/suicide attempt).  With written consent from the patient, the family member/significant other has been provided the following suicide prevention education, prior to the and/or following the discharge of the patient.  The suicide prevention education provided includes the following:  Suicide risk factors  Suicide prevention and interventions  National Suicide Hotline telephone number  [facility] assessment telephone number  [facility] and/or Residential Mobile Crisis Unit telephone number  Request made of family/significant other to:  Remove weapons (e.g., guns, rifles, knives), all items previously/currently identified as safety concern.    Remove drugs/medications (over-the-counter, prescriptions, illicit drugs), all items previously/currently identified as a safety concern.  The family member/significant other verbalizes understanding of the suicide prevention education information provided.  The family member/significant other agrees to remove the items of safety concern listed above.  [name], MSW, LCSWA 11/06/2014, 9:13 AM

## 2014-11-06 NOTE — ED Notes (Signed)
BEHAVIORAL HEALTH ROUNDING Patient sleeping: No. Patient alert and oriented: yes Behavior appropriate: Yes.  ; If no, describe:  Nutrition and fluids offered: Yes  Toileting and hygiene offered: Yes  Sitter present: not applicable Law enforcement present: Yes  

## 2014-11-06 NOTE — BHH Counselor (Signed)
Pt. is to be admitted to [facility] by Dr. Weber Cooks. Attending Physician will be Dr. Franchot Mimes.  Pt. has been assigned to room 302, by [facility] Charge Nurse Berea F.  Intake Paper Work has been signed and placed on pt. chart. ER staff Lattie Haw, ER Sect.; Dr. Edd Fabian, ER MD; Candida Peeling., Patient's Nurse & Debroah Baller, Patient Access) have been made aware of the admission.
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
## 2014-11-07 LAB — CLOZAPINE (CLOZARIL)
Clozapine Lvl: 51 ng/mL — ABNORMAL LOW (ref 350–650)
NORCLOZAPINE: 37 ng/mL
Total(Cloz+Norcloz): 88 ng/mL

## 2014-11-07 MED ORDER — FLUVOXAMINE MALEATE 50 MG PO TABS
100.0000 mg | ORAL_TABLET | Freq: Every day | ORAL | Status: DC
  Administered 2014-11-07 – 2014-11-09 (×3): 100 mg via ORAL
  Filled 2014-11-07 (×3): qty 2

## 2014-11-07 NOTE — BHH Group Notes (Signed)
[facility] LCSW Group Therapy  11/07/2014 4:40 PM  Type of Therapy:  Group Therapy  Participation Level:  Minimal  Participation Quality:  Attentive  Affect:  Anxious  Cognitive:  Alert  Insight:  Limited  Engagement in Therapy:  Limited  Modes of Intervention:  Discussion, Education, Socialization and Support  Summary of Progress/Problems: Feelings around Relapse. Group members discussed the meaning of relapse and shared personal stories of relapse, how it affected them and others, and how they perceived themselves during this time. Group members were encouraged to identify triggers, warning signs and coping skills used when facing the possibility of relapse. Social supports were discussed and explored in detail. Keith attended group. She sat quietly and listened to other group members. She continuously rubbed her eyebrow through out group.   [place] MSW, [place]  11/07/2014, 4:40 PM

## 2014-11-07 NOTE — BHH Group Notes (Signed)
[facility] Group Notes:  (Nursing/MHT/Case Management/Adjunct)  Date:  11/07/2014  Time:  12:30 PM  Type of Therapy:  Group Therapy  Participation Level:  Active  Participation Quality:  Appropriate, Attentive, Sharing and Supportive  Affect:  Appropriate  Cognitive:  Alert, Appropriate and Oriented  Insight:  Appropriate  Engagement in Group:  Engaged  Modes of Intervention:  Discussion  Summary of Progress/Problems:  Melissa George [name] 11/07/2014, 12:30 PM

## 2014-11-07 NOTE — BHH Group Notes (Signed)
[facility] LCSW Aftercare Discharge Planning Group Note  11/07/2014 1:40 PM  Participation Quality:  Appropriate and Attentive  Affect:  Depressed  Cognitive:  Alert  Insight:  Improving  Engagement in Group:  Improving  Modes of Intervention:  Socialization and Support  Summary of Progress/Problems: Patient attended group and participated appropriately sharing her SMART goal is to "get well but hasn't happened yet".   [name], MSW, LCSWA 11/07/2014, 1:40 PM
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
## 2014-11-07 NOTE — Progress Notes (Signed)
Patient ID: Melissa George, female   DOB: [birth year], 64 y.o.   MRN: [id number] PER STATE REGULATIONS 482.30  THIS CHART WAS REVIEWED FOR MEDICAL NECESSITY WITH RESPECT TO THE PATIENT'S ADMISSION/DURATION OF STAY.  NEXT REVIEW DATE:11/10/14  Roma Schanz, RN, BSN CASE MANAGER

## 2014-11-07 NOTE — Plan of Care (Signed)
Problem: Alteration in mood Goal: LTG-Pt's behavior demonstrates decreased signs of depression (Patient's behavior demonstrates decreased signs of depression to the point the patient is safe to return home and continue treatment in an outpatient setting) Outcome: Not Met (add Reason) Monitor and document verbal and behavioral cues of escalating behaviors each shift until discharge.

## 2014-11-07 NOTE — [facility] Suicide Risk Assessment (Signed)
[facility] Admission Suicide Risk Assessment   Nursing information obtained from:    Demographic factors:    Current Mental Status:    Loss Factors:    Historical Factors:    Risk Reduction Factors:    Total Time spent with patient: 1 hour Principal Problem: <principal problem not specified> Diagnosis:   Patient Active Problem List   Diagnosis Date Noted  . Coronary artery disease [I25.10] 11/06/2014  . 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 NSTEMI (non-ST elevated myocardial infarction) [I21.4] 10/29/2014  . Protein-calorie malnutrition, severe [E43] 10/29/2014  . 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 Fatigue [R53.83] 09/28/2014  . 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 Anorexia [R63.0] 09/12/2014  . COPD (chronic obstructive pulmonary disease) [J44.9] 09/12/2014  . GAD (generalized anxiety disorder) [F41.1]      Continued Clinical Symptoms:  Alcohol Use Disorder Identification Test Final Score (AUDIT): 0 The "Alcohol Use Disorders Identification Test", Guidelines for Use in Primary Care, Second Edition.  World Pharmacologist [facility]). Score between 0-7:  no or low risk or alcohol related problems. Score between 8-15:  moderate risk of alcohol related problems. Score between 16-19:  high risk of alcohol related problems. Score 20 or above:  warrants further diagnostic evaluation for alcohol dependence and treatment.   CLINICAL FACTORS:   Previous Psychiatric Diagnoses and Treatments   Musculoskeletal: Strength & Muscle Tone: within normal limits Gait & Station: normal Patient leans: N/A  Psychiatric Specialty Exam: Physical Exam  Nursing note and vitals reviewed.   ROS  Blood pressure 101/63, pulse 92, temperature 97.9 F (36.6 C), temperature source Oral, resp. rate 20, height '5\' 1"'$  (1.549 m), weight 41.277 kg (91 lb).Body mass index is 17.2 kg/(m^2).  General  Appearance: Casual  Eye Contact::  Fair  Speech:  Slow  Volume:  Decreased  Mood:  Anxious, Depressed, Dysphoric and low and down  Affect:  Constricted  Thought Process:  Circumstantial  Orientation:  Full (Time, Place, and Person)  Thought Content:  Rumination  Suicidal Thoughts:  Yes.  without intent/plan  Homicidal Thoughts:  No  Memory:  Immediate;   Fair Recent;   Fair Remote;   Fair guarded  Judgement:  Poor  Insight:  Lacking  Psychomotor Activity:  Normal  Concentration:  Fair  Recall:  [employer] of Knowledge:Fair  Language: Fair  Akathisia:  No  Handed:  Right  AIMS (if indicated):     Assets:  Communication Skills  Sleep:  Number of Hours: 6.45  Cognition: WNL  ADL's:  Intact     COGNITIVE FEATURES THAT CONTRIBUTE TO RISK:  None    SUICIDE RISK:   Minimal: No identifiable suicidal ideation.  Patients presenting with no risk factors but with morbid ruminations; may be classified as minimal risk based on the severity of the depressive symptoms  PLAN OF CARE: Will start pt back on her meds and adjust the dose so that she will do better.  Medical Decision Making:  Review of New Medication or Change in Dosage (2)  I certify that inpatient services furnished can reasonably be expected to improve the patient's condition.   Camie Patience K 11/07/2014, 3:35 PM

## 2014-11-07 NOTE — Progress Notes (Signed)
Recreation Therapy Notes  Date: 07.29.16 Time: 3:20 pm Location: Craft Room  Group Topic: Coping Skills  Goal Area(s) Addresses:  Patients will verbalize emotion experienced while coloring. Patients will verbalize benefit of coloring.  Behavioral Response: Reserved/Withdrawn  Intervention: [place]  Activity: Patients were given a mandala and instructed to color.  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 Patient did not contribute to group discussion.  Leonette Monarch, LRT/CTRS 11/07/2014 4:09 PM

## 2014-11-07 NOTE — Progress Notes (Signed)
Pt admitted to unit from the ED for depression. Pt states she was dropped off by friend states she couldn't handle me. Per documentation, pt she stopped taking her medication and need around the clock care. Pt presents with anxiety and unsteady gait. Speech soft and slow.  Pt continues to rub her left eyebrow area. Site +reddness, no open areas noted. Bactroban applied to affect area. tol PO bedtime medications well. Denies SI/HI. No AV/H noted. No c/o pain/discomfort noted.

## 2014-11-07 NOTE — Progress Notes (Signed)
Patient is alert and oriented. Flat sad affect. Patient did get up and take a shower on her own without prompting.  She also was medication compliant.  Patient was encouraged to get out of bed and attend group.  As of this writing pt has not done that.  Will cont to encourage and monitor for safety.

## 2014-11-07 NOTE — Tx Team (Signed)
Initial Interdisciplinary Treatment Plan   PATIENT STRESSORS: Health problems Medication change or noncompliance   PATIENT STRENGTHS: Average or above average intelligence Financial means General fund of knowledge   PROBLEM LIST: Problem List/Patient Goals Date to be addressed Date deferred Reason deferred Estimated date of resolution  depression                                                       DISCHARGE CRITERIA:  Improved stabilization in mood, thinking, and/or behavior Verbal commitment to aftercare and medication complianced  PRELIMINARY DISCHARGE PLAN: Placement in alternative living arrangements  PATIENT/FAMIILY INVOLVEMENT: This treatment plan has been presented to and reviewed with the patient, [name], and/or family member.  The patient and family have been given the opportunity to ask questions and make suggestions.  Aleen Campi 11/07/2014, 12:01 AM

## 2014-11-08 DIAGNOSIS — F333 Major depressive disorder, recurrent, severe with psychotic symptoms: Principal | ICD-10-CM

## 2014-11-08 MED ORDER — ENSURE ENLIVE PO LIQD
237.0000 mL | Freq: Three times a day (TID) | ORAL | Status: DC
  Administered 2014-11-08 – 2014-11-13 (×16): 237 mL via ORAL

## 2014-11-08 NOTE — Progress Notes (Addendum)
Patient is alert and oriented x 4. Her mood is sad with congruent affect. Denies SI and feels safe in the hospital. Patient has a fixed delusion regarding economic status and this has been unchanged through her hospital stay. Her speech is soft, coherent and her thoughts are organized and linear.  Patient is taking care of ADLs and is taking adequate nutrition and hydration. She is spending time in bed but was encouraged to go to groups.  Plan - continue current treatment plan. Monitor mood and behaviors. Encourage participation in discharge planning with treatment team. Maintain on 15 minute checks for safety.

## 2014-11-08 NOTE — BHH Group Notes (Signed)
[facility] Group Notes:  (Nursing/MHT/Case Management/Adjunct)  Date:  11/08/2014  Time:  8:45 AM  Type of Therapy:  Goals  Participation Level:  Did Not Attend  Pa ummary of Progress/Problems:  Drake Leach 11/08/2014, 8:45 AM

## 2014-11-08 NOTE — B[facility] Suicide Risk Assessment (Signed)
[place] INPATIENT:  Family/Significant Other Suicide Prevention Education  Suicide Prevention Education:  Education Completed;Travis Thackston (Son) has been identified by the patient as the family member/significant other with whom the patient will be residing, and identified as the person(s) who will aid the patient in the event of a mental health crisis (suicidal ideations/suicide attempt).  With written consent from the patient, the family member/significant other has been provided the following suicide prevention education, prior to the and/or following the discharge of the patient.  The suicide prevention education provided includes the following:  Suicide risk factors  Suicide prevention and interventions  National Suicide Hotline telephone number  [facility] assessment telephone number  [facility] Emergency Assistance [place] and/or Residential Mobile Crisis Unit telephone number  Request made of family/significant other to:  Remove weapons (e.g., guns, rifles, knives), all items previously/currently identified as safety concern.    Remove drugs/medications (over-the-counter, prescriptions, illicit drugs), all items previously/currently identified as a safety concern.  The family member/significant other verbalizes understanding of the suicide prevention education information provided.  The family member/significant other agrees to remove the items of safety concern listed above.  [place] MSW, [place]  11/08/2014, 9:41 AM

## 2014-11-08 NOTE — Plan of Care (Signed)
Problem: Consults Goal: Anxiety Disorder Patient Education See Patient Education Module for eduction specifics. Outcome: Progressing Patient continues to worry excessively about her hospital bills, "how am I going to pay the hospital bills .." Pt was encouraged to calm down, was distracted to tell me about her sons Fredonia Highland and [name]), patient able to remember the writer from the last admission.  Problem: Alteration in mood; excessive anxiety as evidenced by: Goal: LTG-Patient's behavior demonstrates decreased anxiety (Patient's behavior demonstrates anxiety and he/she is utilizing learned coping skills to deal with anxiety-producing situations)  Outcome: Progressing Nursing staffs will continue to encourage deep breathing exercise and engage patient in group activities  Problem: Ineffective individual coping Goal: STG: Patient will remain free from self harm Outcome: Progressing Patient has remained free from falls, injury and harm; self-propelled WC, remains on 15 minute check for safety during the day and on 1:1 and continuous safety monitor at bedtime while on Oxygen 2 liters per minute. Nursing staff present at bedside.

## 2014-11-08 NOTE — Plan of Care (Signed)
Problem: Alteration in mood Goal: LTG-Pt's behavior demonstrates decreased signs of depression (Patient's behavior demonstrates decreased signs of depression to the point the patient is safe to return home and continue treatment in an outpatient setting)  Outcome: Progressing Patient denies suicidal thoughts and she is able to contract for safety.

## 2014-11-08 NOTE — Progress Notes (Addendum)
Initial Nutrition Assessment  DOCUMENTATION CODES:  Severe malnutrition in the context of chronic illness on 10/29/2014  INTERVENTION:   Meals and Snacks: Cater to patient preferences Medical Food Supplement Therapy: Recommend continuing Ensure Enlive po BID, each supplement provides 350 kcal and 20 grams of protein, as Melissa George was receiving and drinking inpatient.    NUTRITION DIAGNOSIS:   Inadequate oral intake related to poor appetite, chronic illness as evidenced by meal completion < 50%.  GOAL:   Patient will meet greater than or equal to 90% of their needs  MONITOR:    (Energy Intake, Anthropometrics, Digestive System)  REASON FOR ASSESSMENT:   Malnutrition Screening Tool    ASSESSMENT:   Melissa George admitted to [facility] inpatient from [facility] [facility] on 10/29/2014 secondary to NSTEMI. Melissa George was readmitted to [facility] Unit  11/07/2014.  Past Medical History  Diagnosis Date  . Anxiety   . COPD (chronic obstructive pulmonary disease)   . Anxiety   . 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  Food/Nutrition-Related History: Melissa George eating 35% of meals on average yesterday. From 7/24-7/28 Melissa George eating on average roughly 50% of meals and drinking some Ensures, followed by inpatient RDs.   Medications: Remeron, MVI, Protonix, Colace  Electrolyte/Renal Profile and Glucose Profile:   Recent Labs Lab 11/06/14 1307  NA 139  K 3.9  CL 103  CO2 27  BUN 25*  CREATININE 0.63  CALCIUM 9.5  GLUCOSE 113*   Protein Profile:  Recent Labs Lab 11/06/14 1307  ALBUMIN 4.0    Gastrointestinal Profile: Last BM: 11/06/2014   Nutrition-Focused Physical Exam Findings:  Unable to complete Nutrition-Focused physical exam at this time.  However RD notes Melissa George physical exam was documented as severe fat depletion and severe muscle depletion with  no edema on 10/29/2014.   Weight Change: Melissa George weight has been relatively stable throughout multiple admissions this month.   Skin:  Reviewed, no issues  Last BM:  11/06/2014  Height:   Ht Readings from Last 1 Encounters:  11/06/14 '5\' 1"'$  (1.549 m)    Weight:   Wt Readings from Last 1 Encounters:  11/06/14 91 lb (41.277 kg)   Wt Readings from Last 10 Encounters:  11/06/14  91 lb (41.277 kg)  11/06/14   90 lb (40.824 kg)  11/05/14   87 lb (39.463 kg)  10/31/14   98 lb 7 oz (44.651 kg)  10/25/14   89 lb (40.37 kg)  09/11/14   77 lb (34.927 kg)   Ideal Body Weight:  47.7 kg  BMI:  Body mass index is 17.2 kg/(m^2).  Estimated Nutritional Needs:   Kcal:  1372-1621kcals, BEE: 960kcals, TEE: (IF 1.1-1.3)(AF 1.3) using IBW of 47.7kg  Protein:  38-53g protein (0.8-1.0g/kg) using IBW of 47.7kg  Fluid:  1193-1440m of fluid (25-380mkg) using IBW of 47.7kg  EDUCATION NEEDS:   No education needs identified at this time    MOAlmaRD, LDN Pager [phone]
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
## 2014-11-08 NOTE — Progress Notes (Signed)
   11/07/14 2042 11/08/14 0028  Vital Signs  Pulse Rate (!) 106 83  Pulse Rate Source Dinamap Dinamap  Resp --  20  BP (!) 173/83 mmHg (!) 91/57 mmHg  BP Location Right Arm Right Arm  BP Method Automatic Automatic  Patient Position (if appropriate) Sitting Lying  Patient responded well to Bedtime medications given, will re-assess BP in the morning.

## 2014-11-08 NOTE — Progress Notes (Addendum)
Patient slept for 6.45 hours
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
## 2014-11-08 NOTE — BHH Group Notes (Signed)
[facility] LCSW Group Therapy  11/08/2014 4:18 PM  Type of Therapy:  Group Therapy  Participation Level:  Did Not Attend  Modes of Intervention:  Discussion, Education, Socialization and Support  Summary of Progress/Problems: Pts were asked to identify what balance means to them. They were encouraged to identify what throws them off balance and how to regain balance.    [place] MSW, [place]  11/08/2014, 4:18 PM

## 2014-11-08 NOTE — Progress Notes (Signed)
Pt seen in bed at the start of the shift, awake, quiet, sad, no light, just the light from the window; A&Ox3, remembered the writer from her previous admission; "I came back because I just couldn't cope, I moved in with a friend .Marland Kitchen...it did not work out."

## 2014-11-09 NOTE — Progress Notes (Signed)
Pt continues to be seclusive to her room except for meals. Pt's mood and affect has been depressed and sad. Limited verbal interactions noted. Pt continues to need lots of encouragements to complete ADLs.

## 2014-11-09 NOTE — BHH Group Notes (Signed)
[facility] LCSW Group Therapy  11/09/2014 3:21 PM  Type of Therapy:  Group Therapy  Participation Level:  Did Not Attend  Modes of Intervention:  Discussion, Education, Socialization and Support  Summary of Progress/Problems: Patients identify obstacles, self-sabotaging and enabling behaviors. Patients explore aspects of self sabotage and enabling and how to limit these self-destructive behaviors in everyday life.   [place] MSW, [place]  11/09/2014, 3:21 PM

## 2014-11-09 NOTE — Progress Notes (Signed)
Patient in bed resting at shift change. She got up for a snack and to receive medicines. She remains quiet, sad and depressed.  She had a sitter at her bedside while she received 2 liters of oxygen via nasal cannula. She seemed to rest well through out the night without any complaints.
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
## 2014-11-10 DIAGNOSIS — F332 Major depressive disorder, recurrent severe without psychotic features: Secondary | ICD-10-CM

## 2014-11-10 MED ORDER — FLUVOXAMINE MALEATE 50 MG PO TABS
50.0000 mg | ORAL_TABLET | Freq: Two times a day (BID) | ORAL | Status: DC
  Administered 2014-11-10 – 2014-11-14 (×9): 50 mg via ORAL
  Filled 2014-11-10 (×9): qty 1

## 2014-11-10 MED ORDER — IBUPROFEN 600 MG PO TABS
600.0000 mg | ORAL_TABLET | ORAL | Status: AC
  Administered 2014-11-10: 600 mg via ORAL
  Filled 2014-11-10: qty 1

## 2014-11-10 MED ORDER — CLOZAPINE 25 MG PO TABS
75.0000 mg | ORAL_TABLET | Freq: Every day | ORAL | Status: DC
  Administered 2014-11-10 – 2014-11-14 (×5): 75 mg via ORAL
  Filled 2014-11-10 (×5): qty 3

## 2014-11-10 MED ORDER — CLOZAPINE 100 MG PO TABS: 100.0000 mg | ORAL_TABLET | Freq: Every day | ORAL | Status: DC

## 2014-11-10 MED ORDER — TIOTROPIUM BROMIDE MONOHYDRATE 18 MCG IN CAPS
18.0000 ug | ORAL_CAPSULE | Freq: Every day | RESPIRATORY_TRACT | Status: DC
Start: 2014-11-10 — End: 2014-11-15
  Administered 2014-11-10 – 2014-11-15 (×6): 18 ug via RESPIRATORY_TRACT
  Filled 2014-11-10: qty 5

## 2014-11-10 MED ORDER — IPRATROPIUM-ALBUTEROL 0.5-2.5 (3) MG/3ML IN SOLN: 3.0000 mL | Freq: Four times a day (QID) | RESPIRATORY_TRACT | Status: DC | PRN

## 2014-11-10 MED ORDER — SENNA 8.6 MG PO TABS
2.0000 | ORAL_TABLET | Freq: Every day | ORAL | Status: DC
  Administered 2014-11-10 – 2014-11-14 (×5): 17.2 mg via ORAL
  Filled 2014-11-10 (×5): qty 2

## 2014-11-10 NOTE — Progress Notes (Signed)
Pt remains seclusive to her room. States she still has many worries that cont be resolved. Concerned she may be discharged tomorrow and feels she is not ready to go. Pt med complaint. O2 at bedtime with sitter at bedside.

## 2014-11-10 NOTE — BHH Group Notes (Signed)
[facility] LCSW Aftercare Discharge Planning Group Note  11/10/2014 11:33 AM  Participation Quality:  Did not attend  Affect:    Cognitive:    Insight:    Engagement in Group:    Modes of Intervention:    Summary of Progress/Problems:  Joana Reamer 11/10/2014, 11:33 AM

## 2014-11-10 NOTE — BHH Group Notes (Signed)
[facility] Group Notes:  (Nursing/MHT/Case Management/Adjunct)  Date:  11/10/2014  Time:  1:38 PM  Type of Therapy:  Psychoeducational Skills  Participation Level:  Did Not Attend  Summary of Progress/Problems:  Kathi Ludwig 11/10/2014, 1:38 PM

## 2014-11-10 NOTE — Progress Notes (Signed)
MEDICATION RELATED CONSULT NOTE - INITIAL   Pharmacy Consult for Clozapine  Indication: Clozapine monitoring   Allergies  Allergen Reactions  . Ciprofloxacin Shortness Of Breath and Itching  . Levofloxacin Other (See Comments)    Reaction:  Unknown   . Morphine And Related Nausea And Vomiting  . Sulfa Antibiotics Hives and Itching  . Symbicort [Budesonide-Formoterol Fumarate] Other (See Comments)    Reaction:  Psychotic episode  . Advair Diskus [Fluticasone-Salmeterol] Anxiety    Patient Measurements: Height: '5\' 1"'$  (154.9 cm) Weight: 91 lb (41.277 kg) IBW/kg (Calculated) : 47.8 Adjusted Body Weight:   Vital Signs: Temp: 98.2 F (36.8 C) (08/01 0702) Temp Source: Oral (08/01 0702) BP: 131/79 mmHg (08/01 0702) Pulse Rate: 83 (08/01 0702) Intake/Output from previous day: 07/31 0701 - 08/01 0700 In: 600 [P.O.:600] Out: -  Intake/Output from this shift:    Labs: No results for input(s): WBC, HGB, HCT, PLT, APTT, CREATININE, LABCREA, CREATININE, CREAT24HRUR, MG, PHOS, ALBUMIN, PROT, ALBUMIN, AST, ALT, ALKPHOS, BILITOT, BILIDIR, IBILI in the last 72 hours. Estimated Creatinine Clearance: 45.7 mL/min (by C-G formula based on Cr of 0.63).   Microbiology: No results found for this or any previous visit (from the past 720 hour(s)).  Medical History: Past Medical History  Diagnosis Date  . Anxiety   . COPD (chronic obstructive pulmonary disease)   . Anxiety   . 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 ALPRAZolam  0.25 mg Oral TID  . aspirin EC  81 mg Oral Daily  . atorvastatin  20 mg Oral q1800  . bacitracin   Topical BID  . cloZAPine  75 mg Oral QHS  . docusate sodium  200 mg Oral BID  . feeding supplement (ENSURE ENLIVE)  237 mL Oral TID WC  . fluvoxaMINE  100 mg Oral QHS  . metoprolol tartrate  12.5 mg Oral BID  . mirtazapine  45 mg Oral QHS  . multivitamin with minerals  1 tablet Oral Daily  . pantoprazole  40 mg Oral Daily     Assessment: Patient chronically on clozapine, current orders for 75 mg PO qHS  7/20: ANC= 8200. Submitted labs to registry  7/27: [place] = 8200 Submitted labs to registry on 8/1  Next lab check 8/3   Rocky Morel 11/10/2014,7:29 AM

## 2014-11-10 NOTE — Care Management Utilization Note (Signed)
   Per State Regulation 482.30  This chart was reviewed for necessity with respect to the patient's Admission/ Duration of stay.  Next review date:  11/13/14  Skipper Cliche RN, BSN
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
## 2014-11-10 NOTE — Progress Notes (Signed)
D: Patient out of room for meals appetite poor , Affect flat with depressed mood . Noted Short Of Breath with exertion. No group programming this shift . After eating patient would lye on bed.  No ADL's this shift . Isolates. Limited  Interaction with staff and peers A: Encourage participation, ADL'S Instructions on medications needs re instructions Instruc R: No response or eye contact. Remains flat

## 2014-11-11 MED ORDER — METOPROLOL TARTRATE 25 MG PO TABS: 12.5000 mg | ORAL_TABLET | Freq: Two times a day (BID) | ORAL | Status: DC

## 2014-11-11 MED ORDER — ENSURE ENLIVE PO LIQD: 237.0000 mL | Freq: Three times a day (TID) | ORAL | Status: DC

## 2014-11-11 MED ORDER — SENNA 8.6 MG PO TABS: 2.0000 | ORAL_TABLET | Freq: Every day | ORAL | Status: DC

## 2014-11-11 MED ORDER — ADULT MULTIVITAMIN W/MINERALS CH: 1.0000 | ORAL_TABLET | Freq: Every day | ORAL | Status: DC

## 2014-11-11 MED ORDER — FLUVOXAMINE MALEATE 50 MG PO TABS: 50.0000 mg | ORAL_TABLET | Freq: Two times a day (BID) | ORAL | Status: DC

## 2014-11-11 MED ORDER — MEGESTROL ACETATE 400 MG/10ML PO SUSP: 400.0000 mg | Freq: Every day | ORAL | Status: DC

## 2014-11-11 MED ORDER — ALPRAZOLAM 0.25 MG PO TABS: 0.2500 mg | ORAL_TABLET | Freq: Three times a day (TID) | ORAL | Status: DC

## 2014-11-11 MED ORDER — IPRATROPIUM-ALBUTEROL 0.5-2.5 (3) MG/3ML IN SOLN: 3.0000 mL | Freq: Four times a day (QID) | RESPIRATORY_TRACT | Status: DC | PRN

## 2014-11-11 MED ORDER — TIOTROPIUM BROMIDE MONOHYDRATE 18 MCG IN CAPS: 18.0000 ug | ORAL_CAPSULE | Freq: Every day | RESPIRATORY_TRACT | Status: DC

## 2014-11-11 MED ORDER — CHOLECALCIFEROL 400 UNIT/ML PO LIQD: 400.0000 [IU] | Freq: Every day | ORAL | Status: DC

## 2014-11-11 MED ORDER — PANTOPRAZOLE SODIUM 40 MG PO TBEC: 40.0000 mg | DELAYED_RELEASE_TABLET | Freq: Every day | ORAL | Status: DC

## 2014-11-11 MED ORDER — ATORVASTATIN CALCIUM 20 MG PO TABS: 20.0000 mg | ORAL_TABLET | Freq: Every day | ORAL | Status: DC

## 2014-11-11 MED ORDER — CLOZAPINE 25 MG PO TABS: 75.0000 mg | ORAL_TABLET | Freq: Every day | ORAL | Status: DC

## 2014-11-11 MED ORDER — AMBULATORY NON FORMULARY MEDICATION: 1.0000 mL | Status: DC

## 2014-11-11 MED ORDER — MEGESTROL ACETATE 400 MG/10ML PO SUSP
400.0000 mg | Freq: Every day | ORAL | Status: DC
  Administered 2014-11-11 – 2014-11-14 (×4): 400 mg via ORAL
  Filled 2014-11-11 (×5): qty 10

## 2014-11-11 MED ORDER — ALBUTEROL SULFATE HFA 108 (90 BASE) MCG/ACT IN AERS: 2.0000 | INHALATION_SPRAY | Freq: Four times a day (QID) | RESPIRATORY_TRACT | Status: AC | PRN

## 2014-11-11 MED ORDER — MIRTAZAPINE 45 MG PO TABS: 45.0000 mg | ORAL_TABLET | Freq: Every day | ORAL | Status: DC

## 2014-11-11 MED ORDER — NITROGLYCERIN 0.4 MG SL SUBL: 0.4000 mg | SUBLINGUAL_TABLET | SUBLINGUAL | Status: DC | PRN
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
## 2014-11-11 NOTE — Progress Notes (Signed)
Pt behavior remains the same. seclusive to her room. Did come out for her vital signs. Pt states she is "exhausted" and that is why she doesn't want to get up.Med compliant. Sitter at bedside during the night for use of O2.

## 2014-11-11 NOTE — Evaluation (Signed)
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 Examination reveals that pt performs bed mobility at min assist, transfers at min assist, and ambulation at [facility]. Pt demonstrates generalized weakness and deconditioning, and her performance is limited by her own motivation. With encouragement she will give effort to complete task. She will benefit from skilled PT in order to address the above deficits in order for her to return safely to her home environment    Follow Up Recommendations Home health PT    Equipment Recommendations  Rolling walker with 5" wheels    Recommendations for Other Services       Precautions / Restrictions Precautions Precautions: Fall Restrictions Weight Bearing Restrictions: No      Mobility  Bed Mobility Overal bed mobility: Needs Assistance Bed Mobility: Supine to Sit     Supine to sit: Min assist Sit to supine: Independent   General bed mobility comments: Pt requires trunk support assist to rise into sitting.   Transfers Overall transfer level: Needs assistance Equipment used: None   Sit to Stand: Min assist         General transfer comment: Pt requires minor trunk support to rise into standing. First attempt to stand unsuccessful. It was successful after scooting to EOB  Ambulation/Gait     Assistive device: None   Gait velocity: decreased   General Gait Details: Pt expresses fatigue after 12f of ambulation on room air. Immediately following ambulation her O2 sat was 92% and HR was 102  Stairs            Wheelchair Mobility    Modified Rankin (Stroke Patients Only)       Balance                                              Pertinent Vitals/Pain      Home Living Family/patient expects to be discharged to:: Unsure Living Arrangements:  (Pt wishes to return to her home, not her friend's house) Available Help at Discharge: Friend(s) Type of Home: House Home Access: Stairs to enter Entrance Stairs-Rails: None Entrance Stairs-Number of Steps: 4 Home Layout: One level Home Equipment: None      Prior Function Level of Independence: Independent         Comments: Pt was independent with ambulation and ADLs     Hand Dominance        Extremity/Trunk Assessment                         Communication   Communication: No difficulties  Cognition Arousal/Alertness: Lethargic Behavior During Therapy: Flat affect Overall Cognitive Status: Within Functional Limits for tasks assessed                      General Comments      Exercises        Assessment/Plan    PT Assessment Patient needs continued PT services  PT Diagnosis Generalized weakness;Difficulty walking   PT Problem List Decreased strength;Decreased range of motion;Decreased activity tolerance;Cardiopulmonary status limiting activity  PT Treatment Interventions  DME instruction;Gait training;Stair training;Functional mobility training;Therapeutic activities;Balance training;Therapeutic exercise;Neuromuscular re-education;Cognitive remediation;Patient/family education;Wheelchair mobility training   PT Goals (Current goals can be found in the Care Plan section) Acute Rehab PT Goals Patient Stated Goal: to go home PT Goal Formulation: With patient Time For Goal Achievement: 11/15/14 Potential to Achieve Goals: Fair    Frequency Min 2X/week   Barriers to discharge        Co-evaluation               End of Session Equipment Utilized During Treatment: Gait belt Activity Tolerance: Patient limited by fatigue (Possibly limited by motivation to participate ) Patient left:  (in wheelchair with social  group) Nurse Communication: Mobility status         Time: 1114-1130 PT Time Calculation (min) (ACUTE ONLY): 16 min   Charges:         PT G Codes:        Berry Godsey 12/04/14, 5:20 PM  This note has been reviewed and this clinician agrees with information provided.  Greggory Stallion, PT, DPT [phone]

## 2014-11-11 NOTE — Progress Notes (Signed)
D: Pt denies SI/HI/AVH. Pt is pleasant and cooperative. Patient appears less anxious and she is interacting with peers and staff appropriately.  A: Pt was offered support and encouragement. Pt was given scheduled medications. Pt was encouraged to attend groups. Q 15 minute checks were done for safety.  R:Pt did not attend groups this evening, no SOB on exertion to medication room. Pt is taking medication. Pt has no complaints.Pt receptive to treatment and safety maintained on unit.

## 2014-11-11 NOTE — Tx Team (Signed)
Interdisciplinary Treatment Plan Update (Adult)  Date:  11/11/2014 Time Reviewed:  3:42 PM  Progress in Treatment: Attending groups: Yes. Participating in groups:  Yes. Taking medication as prescribed:  Yes. Tolerating medication:  Yes. Family/Significant othe contact made:  Yes, individual(s) contacted:  sons, [name] and Fredonia Highland Patient understands diagnosis:  Yes. Discussing patient identified problems/goals with staff:  Yes. Medical problems stabilized or resolved:  Yes. Denies suicidal/homicidal ideation: Yes. Issues/concerns per patient self-inventory:  No. Other:  New problem(s) identified: No, Describe:  none reported  Discharge Plan or Barriers: Patient will discharge to [place] to an assisted-living facility. [name] will make arrangements to fly in to pick her up on Friday to transport her there. Patient will need followup care arranged at discharge in [place].  Reason for Continuation of Hospitalization: Anxiety Delusions  Depression Other; describe After care planning  Comments:  Estimated length of stay: up to 3 days, estimated discharge Friday 11/14/14  New goal(s):  Review of initial/current patient goals per problem list:   See Plan of Care  Attendees: Physician:  Orson Slick, MD  8/2/20163:42 PM  Nursing:   Tami Lin, RN 8/2/20163:42 PM  Other:  Carmell Austria, LCSWA 8/2/20163:42 PM   Scribe for Treatment Team:   [name], MSW, LCSWA 11/11/2014, 3:42 PM

## 2014-11-11 NOTE — Progress Notes (Signed)
D:Patient remained in bed most of shift . Up for meals drinking her supplement. Limited interaction with peers  Patient  Remained in fetal position in bed. Verbally patient noted stronger  With response.  To comments Physical Therapy walked patient on unit . No unit participation with programming .  A: Instructed on medications  Verbalize understanding . Encourage patient participations  , ADL's . Patient did shower this am   R: Patient receptive , voice no other concerns

## 2014-11-11 NOTE — Plan of Care (Signed)
Problem: Alteration in mood Goal: LTG-Patient reports reduction in suicidal thoughts (Patient reports reduction in suicidal thoughts and is able to verbalize a safety plan for whenever patient is feeling suicidal)  Outcome: Progressing Denies suicidal ideation, has not hurt self in 2 days.

## 2014-11-11 NOTE — BHH Group Notes (Signed)
[facility] LCSW Group Therapy  11/11/2014 12:54 PM  Type of Therapy:  Group Therapy  Participation Level:  Active  Participation Quality:  Attentive  Affect:  Appropriate  Cognitive:  Appropriate  Insight:  Engaged  Engagement in Therapy:  Engaged  Modes of Intervention:  Discussion, Problem-solving and Support  Summary of Progress/Problems:LCSW introduced and had to enforce group rules today. Patients were very emotional/loud and today's topic was changed to emotional regulation and problem solving. Group members were asked to participate in expressing positive and negative emotions they experience. Several examples where emotions could run high were discussed and then we explored ways to step back- STOP- and diffuse. This patient was asked to share her perception of what happened and when Corine spoke everybody stopped and listened. She stated the unit was insane yesterday night and she just wants it to be CALM. Her group members heard what she requested and they will all keep calm and in control today. Some apologies were made to group members.  Enis Slipper M 11/11/2014, 12:54 PM

## 2014-11-11 NOTE — BHH Group Notes (Signed)
[facility] Group Notes:  (Nursing/MHT/Case Management/Adjunct)  Date:  11/11/2014  Time:  5:21 PM  Type of Therapy:  Psychoeducational Skills  Participation Level:  Did Not Attend  Summary of Progress/Problems:  Kathi Ludwig 11/11/2014, 5:21 PM

## 2014-11-11 NOTE — BHH Group Notes (Signed)
[facility] LCSW Group Therapy  11/11/2014 7:49 AM  Type of Therapy:  Group Therapy  Participation Level:  Did Not Attend  Participation Quality:    Affect:    Cognitive:    Insight:    Engagement in Therapy:    Modes of Intervention:    Summary of Progress/Problems:  Joana Reamer 11/11/2014, 7:49 AM

## 2014-11-11 NOTE — Evaluation (Signed)
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 Examination reveals that pt performs bed mobility at min assist, transfers at min assist, and ambulation at [facility]. Pt demonstrates generalized weakness and deconditioning, and her performance is limited by her own motivation. With encouragement she will give effort to complete task. She will benefit from skilled PT in order to address the above deficits in order for her to return safely to her home environment.     Follow Up Recommendations Home health PT    Equipment Recommendations  Rolling walker with 5" wheels    Recommendations for Other Services       Precautions / Restrictions Precautions Precautions: Fall Restrictions Weight Bearing Restrictions: No      Mobility  Bed Mobility Overal bed mobility: Needs Assistance Bed Mobility: Supine to Sit     Supine to sit: Min assist Sit to supine: Independent   General bed mobility comments: Pt requires trunk support assist to rise into sitting.   Transfers Overall transfer level: Needs assistance Equipment used: None Transfers: Sit to/from Stand Sit to Stand: Min assist         General transfer comment: Pt requires minor trunk support to rise into standing. First attempt to stand unsuccessful. It was successful after scooting to EOB  Ambulation/Gait Ambulation/Gait assistance: Min guard Ambulation Distance (Feet): 30 Feet Assistive device: None Gait Pattern/deviations: Decreased step length - right;Decreased step length - left;Steppage;Decreased stride length Gait velocity: decreased   General Gait Details: Pt expresses fatigue after 83f of ambulation. Immediately following ambulation her  O2 sat was 92% and HR was 102  Stairs            Wheelchair Mobility    Modified Rankin (Stroke Patients Only)       Balance Overall balance assessment: No apparent balance deficits (not formally assessed)                                           Pertinent Vitals/Pain Pain Assessment:  (Pt c/o LBP) Pain Intervention(s): Limited activity within patient's tolerance    Home Living Family/patient expects to be discharged to:: Unsure Living Arrangements:  (Pt wishes to return to her home, not her friend's house) Available Help at Discharge: Friend(s)                  Prior Function Level of Independence: Independent         Comments: Pt was independent with ambulation and ADLs     Hand Dominance        Extremity/Trunk Assessment   Upper Extremity Assessment: Defer to OT evaluation           Lower Extremity Assessment: Difficult to assess due to impaired cognition (MMT results limited by motivation)         Communication   Communication: No difficulties  Cognition Arousal/Alertness: Lethargic Behavior During Therapy: Flat affect Overall Cognitive Status: Within Functional Limits for tasks assessed                      General Comments      Exercises  Assessment/Plan    PT Assessment Patient needs continued PT services  PT Diagnosis Generalized weakness;Difficulty walking   PT Problem List Decreased strength;Decreased range of motion;Decreased activity tolerance;Cardiopulmonary status limiting activity  PT Treatment Interventions DME instruction;Gait training;Stair training;Functional mobility training;Therapeutic activities;Balance training;Therapeutic exercise;Neuromuscular re-education;Cognitive remediation;Patient/family education;Wheelchair mobility training   PT Goals (Current goals can be found in the Care Plan section) Acute Rehab PT Goals Patient Stated Goal: to go home PT Goal Formulation: With  patient Time For Goal Achievement: 11/15/14 Potential to Achieve Goals: Fair    Frequency Min 2X/week   Barriers to discharge        Co-evaluation               End of Session   Activity Tolerance: Patient limited by fatigue (Possibly limited by motivation to participate ) Patient left:  (in wheelchair with social group) Nurse Communication: Mobility status         Time: 1114-1130 PT Time Calculation (min) (ACUTE ONLY): 16 min   Charges:         PT G CodesJanyth Contes 11/13/14, 1:17 PM  Janyth Contes, SPT. [phone]

## 2014-11-11 NOTE — Plan of Care (Signed)
Problem: Alteration in mood; excessive anxiety as evidenced by: Goal: LTG-Patient's behavior demonstrates decreased anxiety (Patient's behavior demonstrates anxiety and he/she is utilizing learned coping skills to deal with anxiety-producing situations)  Outcome: Progressing Pt not rubbing her face this shift.

## 2014-11-12 LAB — CBC WITH DIFFERENTIAL/PLATELET
BASOS PCT: 0 %
Basophils Absolute: 0 10*3/uL (ref 0–0.1)
EOS PCT: 6 %
Eosinophils Absolute: 0.5 10*3/uL (ref 0–0.7)
HEMATOCRIT: 39.7 % (ref 35.0–47.0)
Hemoglobin: 13.3 g/dL (ref 12.0–16.0)
Lymphocytes Relative: 14 %
Lymphs Abs: 1.2 10*3/uL (ref 1.0–3.6)
MCH: 32 pg (ref 26.0–34.0)
MCHC: 33.4 g/dL (ref 32.0–36.0)
MCV: 95.6 fL (ref 80.0–100.0)
Monocytes Absolute: 0.8 10*3/uL (ref 0.2–0.9)
Monocytes Relative: 8 %
NEUTROS PCT: 72 %
Neutro Abs: 6.4 10*3/uL (ref 1.4–6.5)
Platelets: 176 10*3/uL (ref 150–440)
RBC: 4.15 MIL/uL (ref 3.80–5.20)
RDW: 13.4 % (ref 11.5–14.5)
WBC: 9 10*3/uL (ref 3.6–11.0)

## 2014-11-12 MED ORDER — AMBULATORY NON FORMULARY MEDICATION: 2.0000 L | Freq: Every day | Status: DC

## 2014-11-12 MED ORDER — AMBULATORY NON FORMULARY MEDICATION: 1.0000 mL | Status: AC

## 2014-11-12 MED ORDER — AMBULATORY NON FORMULARY MEDICATION: 1.0000 | Freq: Every day | Status: DC

## 2014-11-12 MED ORDER — AMBULATORY NON FORMULARY MEDICATION: 1.0000 | Status: DC

## 2014-11-12 NOTE — BHH Group Notes (Signed)
[facility] LCSW Aftercare Discharge Planning Group Note  11/12/2014 2:17 PM  Participation Quality:  Appropriate  Affect:  Appropriate  Cognitive:  Appropriate  Insight:  Developing/Improving  Engagement in Group:  Developing/Improving  Modes of Intervention:  Discussion, Education and Support  Summary of Progress/Problems:patients goal is to come to group and decide what is going to happen today  Pauletta Browns, Franca Stakes M 11/12/2014, 2:17 PM
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
## 2014-11-12 NOTE — Progress Notes (Signed)
MEDICATION RELATED CONSULT NOTE - INITIAL   Pharmacy Consult for Clozapine  Indication: Clozapine monitoring   Allergies  Allergen Reactions  . Ciprofloxacin Shortness Of Breath and Itching  . Levofloxacin Other (See Comments)    Reaction:  Unknown   . Morphine And Related Nausea And Vomiting  . Sulfa Antibiotics Hives and Itching  . Symbicort [Budesonide-Formoterol Fumarate] Other (See Comments)    Reaction:  Psychotic episode  . Advair Diskus [Fluticasone-Salmeterol] Anxiety    Patient Measurements: Height: '5\' 1"'$  (154.9 cm) Weight: 90 lb (40.824 kg) IBW/kg (Calculated) : 47.8 Adjusted Body Weight:   Vital Signs: BP: 135/69 mmHg (08/03 0705) Pulse Rate: 96 (08/03 0705) Intake/Output from previous day: 08/02 0701 - 08/03 0700 In: 1320 [P.O.:1320] Out: -  Intake/Output from this shift: Total I/O In: 240 [P.O.:240] Out: -   Labs:  Recent Labs  11/12/14 0845  WBC 9.0  HGB 13.3  HCT 39.7  PLT 176   Estimated Creatinine Clearance: 45.2 mL/min (by C-G formula based on Cr of 0.63).   Microbiology: No results found for this or any previous visit (from the past 720 hour(s)).  Medical History: Past Medical History  Diagnosis Date  . Anxiety   . COPD (chronic obstructive pulmonary disease)   . Anxiety   . 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 ALPRAZolam  0.25 mg Oral TID  . aspirin EC  81 mg Oral Daily  . atorvastatin  20 mg Oral q1800  . bacitracin   Topical BID  . cloZAPine  75 mg Oral QHS  . docusate sodium  200 mg Oral BID  . feeding supplement (ENSURE ENLIVE)  237 mL Oral TID WC  . fluvoxaMINE  50 mg Oral BID  . megestrol  400 mg Oral QHS  . metoprolol tartrate  12.5 mg Oral BID  . mirtazapine  45 mg Oral QHS  . multivitamin with minerals  1 tablet Oral Daily  . pantoprazole  40 mg Oral Daily  . senna  2 tablet Oral QHS  . tiotropium  18 mcg Inhalation Daily    Assessment: Patient chronically on clozapine, current orders  for 75 mg PO qHS. MD using Fluvoxamine to augment Clozapine per note.  7/20: ANC= 8200. Submitted labs to registry  7/27: [place] = 8200 Submitted labs to registry on 8/1 8/3:   ANC=[id number]. Submitted labs to registry 8/3  Next lab check [place] PharmD Clinical Pharmacist 11/12/2014

## 2014-11-12 NOTE — Plan of Care (Signed)
Problem: Alteration in mood Goal: LTG-Pt's behavior demonstrates decreased signs of depression (Patient's behavior demonstrates decreased signs of depression to the point the patient is safe to return home and continue treatment in an outpatient setting)  Outcome: Progressing Cooperative with getting out of bed  To sit in chair.

## 2014-11-12 NOTE — Clinical Social Work Note (Signed)
CSW called and spoke with patient's son Melissa George [phone] who will pick patient up Saturday monring at [place]. Patient's son asked that scripts are faxed to [facility] and CSW did fax scripts. Pateint signed consent for [facility] in [place], [place] and CSW along with MD spoke with Rosana Fret, RN [phone] at the facility who is a close friend with one of patient's sons and shared that her facility will find PT and PCP for patient as long as orders are faxed. CSW faxed referral and orders to [facility] to prepare for discharge on Saturday. Patient is agreeable to plan and has been attending groups more today and interacting well with peers since the news of her discharge plan.

## 2014-11-12 NOTE — Progress Notes (Signed)
Physical Therapy Treatment Patient Details Name: Melissa George MRN: [id number] DOB: 13-Sep-1950 Today's Date: 11/12/2014    History of Present Illness Pt is a 64 year-old female admitted to the hospital for a bout of severe depression    PT Comments    Pt making fair progress towards goal with a bit more energy today. She was able to ambulate further. The RW was trialed and she was much more stable. Transfers and bed mobility are doing better this date. She still has activity tolerance and strength deficits that will continue to benefit from skilled PT. Continue to monitor O2 sat with ambulation (reference ambulation section.) This entire session was guided, instructed, and directly supervised by Greggory Stallion, DPT.   Follow Up Recommendations  Home health PT     Equipment Recommendations  Rolling walker with 5" wheels    Recommendations for Other Services       Precautions / Restrictions Precautions Precautions: Fall Restrictions Weight Bearing Restrictions: No    Mobility  Bed Mobility Overal bed mobility: Independent             General bed mobility comments: Needs no assistance  Transfers Overall transfer level: Needs assistance Equipment used: Rolling walker (2 wheeled) Transfers: Sit to/from Stand Sit to Stand: Supervision         General transfer comment: Pt requires supervision for safety within the hospital. She also needed cues for hand placement prior to transfer  Ambulation/Gait Ambulation/Gait assistance: Min guard Ambulation Distance (Feet): 60 Feet Assistive device: Rolling walker (2 wheeled) Gait Pattern/deviations: WFL(Within Functional Limits) Gait velocity: decreased   General Gait Details: Pt ambulats at CGA, needs cues to walk within the walker and try to slow down. Gait with RW looks much smoother and secure than without RW. She agreed. O2 sat at rest on room air was 94%, following ambulation it fell to 78%. Pt needs cues to slow her  breathing down via pursed lip method. After 2 minutes of pursed lip breathing O2 sat returned to 93%     Stairs            Wheelchair Mobility    Modified Rankin (Stroke Patients Only)       Balance Overall balance assessment: No apparent balance deficits (not formally assessed)                                  Cognition Arousal/Alertness: Lethargic Behavior During Therapy: Flat affect Overall Cognitive Status: Within Functional Limits for tasks assessed                      Exercises      General Comments        Pertinent Vitals/Pain Pain Assessment: No/denies pain    Home Living                      Prior Function            PT Goals (current goals can now be found in the care plan section) Acute Rehab PT Goals Patient Stated Goal: to go home PT Goal Formulation: With patient Time For Goal Achievement: 11/15/14 Potential to Achieve Goals: Fair Progress towards PT goals: Progressing toward goals    Frequency  Min 2X/week    PT Plan      Co-evaluation             End of Session  Activity Tolerance: Patient limited by fatigue Patient left: in bed     Time: 8119-1478 PT Time Calculation (min) (ACUTE ONLY): 8 min  Charges:                       G CodesJanyth Contes 12/05/14, 5:31 PM  Janyth Contes, SPT. [phone]

## 2014-11-12 NOTE — [facility] Group Notes (Signed)
[place] LCSW Group Therapy  11/12/2014 2:12 PM  Type of Therapy:  Group Therapy  Participation Level:  Active  Participation Quality:  Attentive  Affect:  Appropriate  Cognitive:  Appropriate  Insight:  Developing/Improving  Engagement in Therapy:  Engaged  Modes of Intervention:  Discussion, Problem-solving and Support  Summary of Progress/Problems:LCSW reviewed group rules. Patients were asked to create a future goal and talk about the positives in their lives. Patients were to discuss the strengths and truth about issues they face. As a collective all members were asked to help patient problem solve. This patient was able to reflect at times her sons try to run her life and her depression is overwhelming. She was supported by her peers.  Enis Slipper M 11/12/2014, 2:12 PM

## 2014-11-12 NOTE — [facility] Group Notes (Signed)
[place] Group Notes:  (Nursing/MHT/Case Management/Adjunct)  Date:  11/12/2014  Time:  1:23 PM  Type of Therapy:  Psychoeducational Skills  Participation Level:  Did Not Attend   Adela Lank [facility] 11/12/2014, 1:23 PM

## 2014-11-12 NOTE — Progress Notes (Signed)
D: Patient  Remains in bed , fetal position. Out of bed for meals . Limited conversation with  Staff no interaction with peers . No ADL's  Or person chores  Done . Patient using wheelchair on unit. Appetite fair to poor . No attendance with unit programming . Voice of low energy. A: Encourage patient  Participations  With unit programming, and ADL'S. Instruction on medication , verbalize understanding  R: Receptive to information given , Voice no other concerrns . Affect flat and depresed

## 2014-11-13 ENCOUNTER — Ambulatory Visit: Payer: Self-pay | Admitting: Psychiatry

## 2014-11-13 MED ORDER — ENSURE ENLIVE PO LIQD
237.0000 mL | Freq: Three times a day (TID) | ORAL | Status: DC
  Administered 2014-11-13 – 2014-11-14 (×3): 237 mL via ORAL

## 2014-11-13 NOTE — Progress Notes (Signed)
   11/12/14 1724  PT Visit Information  Last PT Received On 11/12/14  Assistance Needed +1  History of Present Illness Pt is a 64 year-old female admitted to the hospital for a bout of severe depression  PT Time Calculation  PT Start Time (ACUTE ONLY) 1628  PT Stop Time (ACUTE ONLY) 1636  PT Time Calculation (min) (ACUTE ONLY) 8 min  Subjective Data  Patient Stated Goal to go home  Precautions  Precautions Fall  Restrictions  Weight Bearing Restrictions No  Pain Assessment  Pain Assessment No/denies pain  Cognition  Arousal/Alertness Lethargic  Behavior During Therapy Flat affect  Overall Cognitive Status Within Functional Limits for tasks assessed  Bed Mobility  Overal bed mobility Independent  General bed mobility comments Needs no assistance  Transfers  Overall transfer level Needs assistance  Equipment used Rolling walker (2 wheeled)  Transfers Sit to/from Stand  Sit to Stand Supervision  General transfer comment Pt requires supervision for safety within the hospital. She also needed cues for hand placement prior to transfer  Ambulation/Gait  Ambulation/Gait assistance Min guard  Ambulation Distance (Feet) 60 Feet  Assistive device Rolling walker (2 wheeled)  Gait Pattern/deviations WFL(Within Functional Limits)  General Gait Details Pt ambulats at CGA, needs cues to walk within the walker and try to slow down. Gait with RW looks much smoother and secure than without RW. She agreed. O2 sat at rest on room air was 94%, following ambulation it fell to 78%. Pt needs cues to slow her breathing down via pursed lip method. After 2 minutes of pursed lip breathing O2 sat returned to 93%    Gait velocity decreased  Balance  Overall balance assessment No apparent balance deficits (not formally assessed)  PT - End of Session  Activity Tolerance Patient limited by fatigue  Patient left in bed  PT - Assessment/Plan  PT Frequency (ACUTE ONLY) Min 2X/week  Follow Up Recommendations  Home health PT  PT equipment Rolling walker with 5" wheels  PT Goal Progression  Progress towards PT goals Progressing toward goals  Acute Rehab PT Goals  PT Goal Formulation With patient  Time For Goal Achievement 11/15/14  Potential to Achieve Goals Fair  PT General Charges  $$ ACUTE PT VISIT 1 Procedure  PT Treatments  $Gait Training 8-22 mins   Late Entry Charge for 11/12/14  Greggory Stallion, PT, DPT [phone]

## 2014-11-13 NOTE — Plan of Care (Signed)
Problem: Alteration in mood Goal: LTG-Pt's behavior demonstrates decreased signs of depression (Patient's behavior demonstrates decreased signs of depression to the point the patient is safe to return home and continue treatment in an outpatient setting)  Outcome: Not Progressing Patient still isolates in the room, not interacting with peers.

## 2014-11-13 NOTE — Plan of Care (Signed)
Problem: Alteration in mood Goal: LTG-Patient reports reduction in suicidal thoughts (Patient reports reduction in suicidal thoughts and is able to verbalize a safety plan for whenever patient is feeling suicidal)  Outcome: Progressing Pt currently expresses no SI.  Goal: LTG-Pt's behavior demonstrates decreased signs of depression (Patient's behavior demonstrates decreased signs of depression to the point the patient is safe to return home and continue treatment in an outpatient setting)  Outcome: Not Progressing Pt continues to show signs of depression.  Goal: STG-Patient is able to discuss feelings and issues (Patient is able to discuss feelings and issues leading to depression)  Outcome: Not Progressing Pt continues to state "I don't know" to most questions. When she does answer she is self-depreciating and hopeless.

## 2014-11-13 NOTE — Plan of Care (Signed)
Problem: Spiritual Needs Goal: Ability to function at adequate level Outcome: Not Progressing Pt stays in bed sleeping most of day  Problem: Alteration in mood Goal: LTG-Patient reports reduction in suicidal thoughts (Patient reports reduction in suicidal thoughts and is able to verbalize a safety plan for whenever patient is feeling suicidal)  Outcome: Progressing Denies SI, endorses depression

## 2014-11-13 NOTE — Progress Notes (Signed)
   November 23, 2014 1145  PT G-Codes **NOT FOR INPATIENT CLASS**  Functional Assessment Tool Used clinical judgement  Functional Limitation Mobility: Walking and moving around  Mobility: Walking and Moving Around Current Status (H2122) CJ  Mobility: Walking and Moving Around Goal Status (Q8250) CI  Late entry G codes for date 11/23/2014.   Greggory Stallion, PT, DPT [phone]

## 2014-11-13 NOTE — Progress Notes (Signed)
D: Pt denies SI/HI/AVH. Patient's affect is sad, flat and depressed. Pt stated" she feels better" patient appears less anxious. Patient was noted to isolates to her room and is not interacting with peers.  A: Pt was offered support and encouragement. Pt was given scheduled medications. Pt was encouraged to attend groups. Q 15 minute checks were done for safety.  R: Pt did not attend wrap up group.  Pt is taking medication. Pt receptive to treatment and safety maintained on unit.

## 2014-11-13 NOTE — Progress Notes (Signed)
D: Patient Remains in bed most of shift. Encouraged pt to get out of bed for meals and to shower and attend group. Minimal conversation withstaff, no interaction with peers .  Patient using wheelchair and walker on unit. Appetite fair to poor. A: Encouraged  patient to participate in group and to shower.  R: Receptive to information given, pt did attend group today, and is requesting clean clothes and toiletries to shower. Pt remains safe on unit with q 15 min checks

## 2014-11-13 NOTE — [facility] Group Notes (Signed)
[place] Group Notes:  (Nursing/MHT/Case Management/Adjunct)  Date:  11/13/2014  Time:  1:27 PM  Type of Therapy:  Movement Therapy  Participation Level:  Minimal  Participation Quality:  Appropriate  Affect:  Appropriate  Cognitive:  Alert, Appropriate and Oriented  Insight:  Good  Engagement in Group:  Limited  Modes of Intervention:  Activity  Summary of Progress/Problems: patient left early due to breathing.   Chriss Mannan De'Chelle Wilkie Zenon 11/13/2014, 1:27 PM

## 2014-11-13 NOTE — Progress Notes (Signed)
Recreation Therapy Notes  Date: 08.04.16 Time: 3:00 pm Location: Craft Room  Group Topic: Self-expression  Goal Area(s) Addresses:  Patient will indentify one color per emotion listed on wheel. Patient will verbalize benefit of using art as a means of self-expression. Patient will verbalize one emotion experienced during session. Patient will be educated on other forms of self-expression.  Behavioral Response: Reserved, Withdrawn  Intervention: Emotion Wheel  Activity: Patients were given an [employer] with 7 different emotions. Patients were instructed to pick a color for each emotion and color it in.  Education: LRT educated patients on different forms of self-expression.  Education Outcome: In group clarification offered   Clinical Observations/Feedback: Patient colored one emotion. Patient did not contribute to group discussion.  Leonette Monarch, LRT/CTRS 11/13/2014 3:50 PM

## 2014-11-13 NOTE — Care Management Utilization Note (Signed)
   Per State Regulation 482.30  This chart was reviewed for necessity with respect to the patient's Admission/ Duration of stay.  Next review date: 11/17/14  Skipper Cliche RN, BSN

## 2014-11-13 NOTE — Progress Notes (Signed)
Nutrition Follow-up     INTERVENTION:  Medical Food Supplement Therapy: will increase supplement frequency to QID; Ensure Enlive (each supplement provides 350 kcal and 20 grams of protein) Meals and Snacks: encourage menu completion to best meet pt preferences; pt on appetite stimulants, regular diet  NUTRITION DIAGNOSIS:   Inadequate oral intake related to poor appetite, chronic illness as evidenced by meal completion < 50%. Continues but being addressed as increasing frequency of supplement   GOAL:   Patient will meet greater than or equal to 90% of their needs   MONITOR:    (Energy Intake, Anthropometrics, Digestive System)  REASON FOR ASSESSMENT:   Malnutrition Screening Tool    ASSESSMENT:   Pt admitted to [facility] inpatient from [facility] on 10/29/2014 secondary to NSTEMI. Pt was readmitted to [facility]  11/07/2014. Pt remains depressed, weak, poor energy, poor appetite.    Diet Order:  Diet regular Room service appropriate?: Yes; Fluid consistency:: Thin   Energy Intake: pt eating only 0-30% of meals, 12.5% of meals on average. Pt is drinking Ensure supplements; receiving TID  Skin:  Reviewed, no issues  Electrolyte and Renal Profile:  Recent Labs Lab 11/06/14 1307  BUN 25*  CREATININE 0.63  NA 139  K 3.9   Meds: remeron, megace, colace, milk of mag, senokot, MVI  Height:   Ht Readings from Last 1 Encounters:  11/06/14 '5\' 1"'$  (1.549 m)    Weight:   Wt Readings from Last 1 Encounters:  11/12/14 90 lb (40.824 kg)   Filed Weights   11/06/14 2105 11/12/14 0705  Weight: 91 lb (41.277 kg) 90 lb (40.824 kg)     Ideal Body Weight:  47.7 kg  BMI:  Body mass index is 17.01 kg/(m^2).  Estimated Nutritional Needs:   Kcal:  1372-1621kcals, BEE: 960kcals, TEE: (IF 1.1-1.3)(AF 1.3) using IBW of 47.7kg  Protein:  38-53g protein (0.8-1.0g/kg) using IBW of 47.7kg  Fluid:  1193-1455m of fluid (25-336mkg) using IBW of  47.7kg  EDUCATION NEEDS:   No education needs identified at this time  MOByram CenterRD, LDN ([phone]
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
## 2014-11-13 NOTE — B[facility] Group Notes (Signed)
[facility] LCSW Group Therapy  11/13/2014 3:20 PM  Type of Therapy:  Group Therapy  Participation Level:  Active  Participation Quality:  Attentive  Affect:  Appropriate  Cognitive:  Appropriate  Insight:  Developing/Improving  Engagement in Therapy:  Developing/Improving  Modes of Intervention:  Discussion, Education, Problem-solving and Support  Summary of Progress/Problems:LCSW introduced group rules to all patients ( small group) Our topic was overcoming obstacles/challenges/ LCSW had a group members speak out to answer  Challenges they face .LCSW empowered group as a collective to be supportive to each other. This patient was able to talk about her physical issues as her biggest barrier. She listened to others in group and appeared to follow what was being stated. She stated her favorite animal is a dog- she was sad to lose her dog as she couldn't care for it anymore.  Pauletta Browns, [name] 11/13/2014, 3:20 PM

## 2014-11-14 LAB — CBC WITH DIFFERENTIAL/PLATELET
BASOS ABS: 0 10*3/uL (ref 0–0.1)
Basophils Relative: 1 %
Eosinophils Absolute: 0.5 10*3/uL (ref 0–0.7)
Eosinophils Relative: 6 %
HCT: 37.5 % (ref 35.0–47.0)
Hemoglobin: 12.7 g/dL (ref 12.0–16.0)
LYMPHS PCT: 16 %
Lymphs Abs: 1.2 10*3/uL (ref 1.0–3.6)
MCH: 32 pg (ref 26.0–34.0)
MCHC: 33.7 g/dL (ref 32.0–36.0)
MCV: 94.8 fL (ref 80.0–100.0)
MONOS PCT: 12 %
Monocytes Absolute: 0.9 10*3/uL (ref 0.2–0.9)
Neutro Abs: 4.9 10*3/uL (ref 1.4–6.5)
Neutrophils Relative %: 65 %
PLATELETS: 167 10*3/uL (ref 150–440)
RBC: 3.96 MIL/uL (ref 3.80–5.20)
RDW: 13.2 % (ref 11.5–14.5)
WBC: 7.5 10*3/uL (ref 3.6–11.0)

## 2014-11-14 LAB — CLOZAPINE (CLOZARIL)
Clozapine Lvl: 620 ng/mL (ref 350–650)
NorClozapine: 119 ng/mL
Total(Cloz+Norcloz): 739 ng/mL

## 2014-11-14 MED ORDER — ENSURE ENLIVE PO LIQD
237.0000 mL | Freq: Three times a day (TID) | ORAL | Status: DC
  Administered 2014-11-14 – 2014-11-15 (×4): 237 mL via ORAL

## 2014-11-14 NOTE — Progress Notes (Signed)
  [facility] Adult Case Management Discharge Plan :  Will you be returning to the same living situation after discharge:  No. Patient will be going to [place] to [facility] [phone] where patient's son Fredonia Highland has arranged living and Rosana Fret, RN at facility is arranging home health for nursing and PT.  At discharge, do you have transportation home?: Yes,  patient's son [name] wil pick up at discharge and drive patient to [place] you have the ability to pay for your medications: Yes,  patient has insurance and scripts are faxed to [place] of information consent forms completed and in the chart;  Patient's signature needed at discharge.  Patient to Follow up at: Follow-up Information    Follow up with [facility]. Go in 3 days.   Why:  For follow-up care; initial appointment to get registered within 7 days of hospital discharge; walk in appts M-W 8am-6pm and Th-F 8am-5pm; walk in for appointment on Tuesday 11/18/14 at 8:00am to register for mental health services   Contact information:   [place] [address] Ph [phone] Fax [phone]      Patient denies SI/HI: Yes,  patient denies SI/HI    Safety Planning and Suicide Prevention discussed: Yes,  SPE discussed with patient and her son     Has patient been referred to the Quitline?: Patient refused referral  [name], MSW, LCSWA 11/14/2014, 4:31 PM
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
## 2014-11-14 NOTE — BHH Group Notes (Signed)
[facility] Group Notes:  (Nursing/MHT/Case Management/Adjunct)  Date:  11/14/2014  Time:  4:27 PM  Type of Therapy:  Group Therapy  Participation Level:  Did Not Attend  Summary of Progress/Problems:  Melissa George 11/14/2014, 4:27 PM

## 2014-11-14 NOTE — BHH Group Notes (Signed)
[facility] LCSW Aftercare Discharge Planning Group Note   11/14/2014 11:43 AM  Participation Quality:  Did not attend.    Wolfe MSW, LCSWA

## 2014-11-14 NOTE — Progress Notes (Signed)
Recreation Therapy Notes  Date: 08.05.16 Time: 3:00 pm Location: Craft Room  Group Topic: Coping Skills  Goal Area(s) Addresses:  Patient will verbalize an emotion they experienced during group. Patient will verbalize benefit of art.  Behavioral Response:Reserved/Withdrawn  Intervention: [place]  Activity: Patients were given mandalas to color.  Education: LRT educated patients on the benefits of art.  Education Outcome: In group clarification offered   Clinical Observations/Feedback: Patient did not participate in group activity. Patient did not participate in group discussion.  Leonette Monarch, LRT/CTRS 11/14/2014 4:36 PM

## 2014-11-14 NOTE — Progress Notes (Signed)
D: Pt in room, stays in bed most of shift. Does not want to eat or sit up. Pt is med compliant, does not attend group. Denies SI, endorses depression. Pt encouraged to eat meals in day room with peers. Pt asked for 5 minutes to lay down. Staff had to return to get patient to eat meal. A: Encouragement and supervision at meal times offered. Encouraged pt to shower and attend groups. R: Pt receptive, but continues to stay in room. Pt did go to meals with much encouragement. Pt remains safe on unit q 15 min checks.

## 2014-11-14 NOTE — Plan of Care (Signed)
Problem: Alteration in mood Goal: LTG-Patient reports reduction in suicidal thoughts (Patient reports reduction in suicidal thoughts and is able to verbalize a safety plan for whenever patient is feeling suicidal)  Outcome: Progressing Denies SI     

## 2014-11-14 NOTE — BHH Suicide Risk Assessment (Signed)
[place] INPATIENT:  Family/Significant Other Suicide Prevention Education  Suicide Prevention Education:  Education Completed; Melissa George (son) [phone] has been identified by the patient as the family member/significant other with whom the patient will be residing, and identified as the person(s) who will aid the patient in the event of a mental health crisis (suicidal ideations/suicide attempt).  With written consent from the patient, the family member/significant other has been provided the following suicide prevention education, prior to the and/or following the discharge of the patient.  The suicide prevention education provided includes the following:  Suicide risk factors  Suicide prevention and interventions  National Suicide Hotline telephone number  [facility] assessment telephone number  [facility] Emergency Assistance [place] and/or Residential Mobile Crisis Unit telephone number  Request made of family/significant other to:  Remove weapons (e.g., guns, rifles, knives), all items previously/currently identified as safety concern.    Remove drugs/medications (over-the-counter, prescriptions, illicit drugs), all items previously/currently identified as a safety concern.  The family member/significant other verbalizes understanding of the suicide prevention education information provided.  The family member/significant other agrees to remove the items of safety concern listed above.  [name], MSW, LCSWA 11/14/2014, 4:30 PM

## 2014-11-14 NOTE — Discharge Summary (Addendum)
Physician Discharge Summary Note  Patient:  Melissa George is an 64 y.o., female MRN:  [id number] DOB:  07/20/1950 Patient phone:  [phone] (home)  Patient address:   [address] [place] [address],  Total Time spent with patient: 30 minutes  Date of Admission:  11/06/2014 Date of Discharge: 11/15/2014  Reason for Admission:  Severe depression, delusion, self care deficits  Principal Problem: Major depressive disorder, recurrent episode, severe with catatonia Discharge Diagnoses: Patient Active Problem List   Diagnosis Date Noted  . Coronary artery disease [I25.10] 11/06/2014  . NSTEMI (non-ST elevated myocardial infarction) [I21.4] 10/29/2014  . Protein-calorie malnutrition, severe [E43] 10/29/2014  . 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 COPD (chronic obstructive pulmonary disease) [J44.9] 09/12/2014  . GAD (generalized anxiety disorder) [F41.1]     Musculoskeletal: Strength & Muscle Tone: decreased Gait & Station: slow Patient leans: Front  Psychiatric Specialty Exam: Physical Exam  Review of Systems  HENT: Negative.   Eyes: Negative.   Respiratory: Positive for shortness of breath.   Cardiovascular: Negative.   Gastrointestinal: Negative.   Genitourinary: Negative.   Skin: Negative.   Neurological: Positive for weakness.  Endo/Heme/Allergies: Negative.   Psychiatric/Behavioral: Positive for depression.    Blood pressure 97/56, pulse 83, temperature 98.5 F (36.9 C), temperature source Oral, resp. rate 20, height 5' 1" (1.549 m), weight 40.824 kg (90 lb), SpO2 89 %.Body mass index is 17.01 kg/(m^2).  General Appearance: Fairly Groomed  Engineer, water::  Poor  Speech:  Slow  Volume:  Decreased  Mood:  Dysphoric  Affect:  Blunt  Thought Process:  Linear  Orientation:  Full (Time, Place, and Person)  Thought Content:  Delusions  Suicidal  Thoughts:  no  Homicidal Thoughts:  No  Memory:  Immediate;   Good Recent;   Good Remote;   Good  Judgement:  Fair  Insight:  Shallow  Psychomotor Activity:  Decreased  Concentration:  Fair  Recall:  NA  Fund of Knowledge:Good  Language: Good  Akathisia:  No  Handed:    AIMS (if indicated):     Assets:  Financial Resources/Insurance Housing Social Support  ADL's:  Intact  Cognition: WNL  Sleep:  Number of Hours: 8.14 September 2014 History of Present Illness:: Melissa George is a 64 year old divorced Caucasian female with a history of major depressive disorder with psychotic features followed by Dr. Jimmye Norman as an outpatient for psychotropic medication management to came to the emergency room originally on 09/12/14 secondary to paranoid and delusional thoughts as well as worsening depressive symptoms. The patient had been intermittently compliant with Seroquel, Wellbutrin, Ativan and Remeron as prescribed to her by Dr. Jimmye Norman and last saw Dr. Jimmye Norman on May 20. More recently, she has been having recurrent and obsessive thoughts that she is going to jail and that her son is also going to be arrested for not paying hospital bills. She had informed of the psychiatrist in the emergency room that she was also having thoughts that she would be arrested because her manager at work thought that she was stealing money. She denies any current active suicidal thoughts but has had some passive suicidal thoughts that she should die because she believes that she is about person. She denies any homicidal thoughts. Currently, she denies any auditory or visual hallucinations but did have problems with hallucinations prior to her admission in November.  The patient says that one of her sons has been visiting from out of town and there has been some conflict at home as well. She cannot identify any other specific stressors for recent psychotic symptoms. The patient appears cachectic and has not been eating  well. She says her appetite has been low. She does report some problems with insomnia as well. The patient is willing to be compliant with medications and outpatient treatment. She is calm and cooperative and there are no signs of agitation or aggression. She denies any history of any heavy alcohol use or illicit drug use but there is questionable overuse of benzodiazepine.  Review of records: -This patient was admitted to [facility] psychiatry from April 6 of April 26. 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 She was discharged on Ativan 0.5 mg 3 times a day vitamin D D8 100 units, Seroquel 50 mg daily at bedtime and 25 mg up to 4 times a day for anxiety. Patient receive a trial of Abilify that caused akathisia and a trial of olanzapine that caused EPS.  Test completed at [facility]: - Thyroid Studies: TSH, free T4, T3 were all within normal limits - Vitamin D level was 23 (indicating mild-moderate deficiency, which can contribute to depressed mood and impaired cognition) - Folate level was within normal limits - Vitamin B12 (in the 700s) was within normal limits - Sedimentation rate (a sign of overall inflammation) was within normal limits - Lyme Antibody Serology was negative. Lyme Disease Serology4/03/2015  -HIV was non-reactive. RPR was non reactive - Chest CT was obtained to evaluate a nodule that was found on your Chest X-ray. This nodule is not consistent with a malignancy, and there was no other evidence of malignancy throughout the chest. 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 Her discharge medications were Haldol 2 mg at bedtime, Benadryl 50 mg at bedtime, and clonazepam 0.5 mg every 8 hours and  mirtazapine 15 mg by mouth daily at bedtime. For COPD she was d/c on Proair, tiotropium and fluticasone salmeterol. At that time the patient stated that her job was conducting a investigation and she felt that they were going to try to blame her for all the mistakes. A few days prior to her presentation to the psychiatric unit she was hospitalized for COPD exacerbation and was placed on a prednisone taper patient stated that after they COPD medications were changed she is started seeing shadows and feeling paranoid. As all the psychotic symptoms started after symbicor was added , this medication was the most likely cause of psychosis. All labs and brain imaging were neg. HIV-, RPR-, B12 wnl, TH wnl, Ammonia wnl, Brain MRI wnl. She had been working at [employer] for the past 9 years. She has 2 sons. They live in [place] and [place].  Collateral info: Therapist Katha Cabal [phone]: seen her since Feb. Thinks pt has been non compliant with medications. Ms. Juanita Craver is stated that she thinks this last decompensation was triggered by the patient knowing that her son was going to leave [place] and return to [place] where he lives. Would like a d/c summary. [place].   [name] [phone]: He  reported patient did not do well on Zyprexa while at [facility]. " She looked like a zombie". Per discharge summary patient had EPS side effects to olanzapine. It appears that he was mainly discontinued due to the family insistence. Fredonia Highland (son) [phone] he feels that Seroquel has failed to help the patient. He is in agreement with retrying the Haldol. Both of her sons are concerned with the possibility of Lyme's disease. Serology for lyme's disease was completed at [facility] and was found to be negative  Collateral information w was obtained from Dr. Jimmye Norman. He also reports patient was not compliant with regimen. He recommends a higher level of care as patient needs to be  follow-up closer and he is unable to provide that level of service  Treatment plan (06/16) Ms. Renaldo is a 64 year old divorced Caucasian female with history of recurrent major depression with psychotic features who came to the emergency room with paranoid and delusional thoughts believing that she was going to be arrested. She does admit to worsening depressive symptoms, passive suicidal thoughts, low appetite and weight loss in addition to paranoid thoughts.   Major depressive disorder, recurrent, severe/delusional disorder persecutory type. -Continue Remeron 45 mg by mouth daily at bedtime -Seroquel: Patient was restarted on Seroquel in April 2016 during her hospitalization at [facility]. It has been discontinued due to lack of effectiveness and also due to the dizziness and lightheadedness patient developed after the dose was increased.  - After discontinuation of Seroquel patient was started on haloperidol as she had a positive response to this agent back in November 2015. Haldol (4 mg total dose)had to be discontinued as patient reported severe anxiety after taking the Haldol. Patient was reporting worsening of shortness of breath after taking it. He was difficult to determine whether what the patient reported was akathisia or not. No major decrease in severity of delusions with Haldol. -Risperdal trial: Patient failed to improve with 4 mg of Risperdal. Patient had a significant decline on risperidone.  After the Risperdal was discontinued the patient was restarted on a trial of Clozaril. -Clozaril option was discussed: family agrees. Side effects of Clozaril were discussed in detail with the family. Social worker was present. (Sedation, constipation orthostatic hypotension, fall, myocarditis, seizures, DVT, drooling) . ANC was 7.4 on 6/28. She was started on Clozaril on 6/28. Patient had a positive response to Clozaril however she had sedation and a started spending more time sleeping in bed. Family   noticed a improvement on delusions with the Clozaril (around 150 mg total dose).   -ECT option has been discussed with patient (in several occacions) but she does not agree with this treatment option. Unable to do this treatment as family does not have a healthcare power of attorney and cannot consent for patient.   GAD: continue xanax  0.25 mg tid with meals due to reports with sedation any dizziness. Patient seems to respond much better to alprazolam and Xanax than she does to clonazepam.  Insomnia: Not longer having insomnia since is started on the Clozaril.  Vitamin D deficiency : Continue Vit D   Metabolic syndrome monitoring : HgA1c was 5.1 and Total cholesterol was 186.   Weight loss/Anorexia: BMI of 14.6 at admission/weight 34.9 kg/76.9lbs.  Continue mVT with minerals. Continue Ensure but will increase to qid. Patient has been evaluated by the dietitian. Anorexia appears to be secondary to severe anxiety and psychosis. Continue megace 400 mg q day.   Baseline blood: Vitamin B12 was in the 700 when  hospitalized at [facility] in April Hemoglobin A1c is 5.1. Lipid panel shows triglycerides of 236. Folate was 17.5. TSH was wnl.  COPD: During her stay in the hospital patient receive a prednisone taper which she completed on June 26. She was seen by internal medicine and by pulmonology. The patient is on Spiriva and duoneb. Continue 2 L oxygen at bedtime and when necessary shortness of breath.Internal medicine was concerned about nodule lesion found on CT completed at [facility]. They involved pulmonary. Chest CT repeated. Pulmonology feels nodule has been w/o change since 2011. Likely benign.   Tobacco use disorder: Continue nicotine patch  Constipation: Patient was started on Senokot 2 tablets by mouth daily at bedtime and Colace 200 mg by mouth twice a day. Despite his taking these 2 agents patient continued to have issues with constipation. She was treated with MiraLAX every other day with  limited response. Therefore she was started on Mg citrate 3 times a week.   Phone conference with patient's family during the month of June: Spoke with both of her sons on 6/15. Social worker also spoke with both of them about discharge planning. [name] was contacted by me on June 7. Family feels frustrated when he comes to elaborating plans about discharge as they feel any hope that their mother will improve and will be able to return to live independently. They were educated about the diagnosis of delusional disorder and the poor prognosis that delusional disorder has. It was explained to them that the delusional thinking has not improved and as a result of that patient continues to have depressive symptoms and hopelessness. It is our recommendation that once patient gets discharged it will be necessary for her to be supervised. As she is likely to return to be noncompliant and stopped eating. Family is however out of the state there is no ability for them to supervise her. There is also no financial ability for the patient to move out of her home and go to an assisted living facility. Patient is planning on talking to a close friend of hers and see if the friend can provide some supervision.   On 6/17 a 78 m family meeting was held with [name], [name], the patient's son [name] [name] and this Probation officer. Patient's diagnosis and medications were reviewed and discussed in detail with the patient and her son. We discussed the recommendations for discharge which are mainly that patient requires assisted living care. At this point in time patient does not have the financial means to pay out of pocket for the cost of a assisted living facility. The patient does not have insurance that covers the cost of this type of placement. Patient is currently receiving home health unfortunately Medicare only covers a couple of hours a week. We plan to continue with home health. Social worker has  contacted a very close friend of the patient whom the patient has known since childhood. She has well, Ms. laughing into her house as long as necessary. She also agrees with driving Mrs Mckissack to appointments when necessary. Patient's son had concerns about the treatment with clonazepam. However earlier this with those concerns with the treatment with alprazolam as patient has abused this medication. Today I informed them that this medication was discontinued due to their concerns and that she was started on clonazepam. Then patient somebody is concerned about the problems with clonazepam affecting the COPD. They were sure that the patient was prescribed only with a minimal dose of  clonazepam. Patient's son also requested for the patient to be treated for Lyme's disease. They have been told the patient has been positive for Lyme's disease in the past. They feel that a couple of times that she has received anti-biotics for lung infections she has improved mentally and physically. Family is very insistent on having the patient treated for Lyme disease. During her hospitalization at [facility] family brought up this issue and patient had testing for Lyme's disease which was negative. Due to their insistence that I discussed this case today with Dr. Ola Spurr from infectious disease. He reviewed the patient's results from [facility]. After reviewing the chart he feels that there is no need for treatment for Lyme disease. He does not see any evidence of Lyme disease in this case.  On 6/20 second family meeting was held with patient, her 2 sons, Judson Roch Building control surveyor and this Probation officer. We discussed the change of medications as patient might have developed akathisia from Haldol. I have explained to the family that patient might need to stay with asked for 1 more week. Family continues to insist that patient needs to be treated with doxycycline for Lyme's disease. I explained to the family that Dr. Ola Spurr from infectious  disease had reviewed the case and felt there was no need for treatment for Lyme disease. They insisted to speak directly with Dr. Ola Spurr. I contacted Dr. Ola Spurr who completed a consult.  On 6/23 a third family meeting was held. Both of her sons, Judson Roch Building control surveyor, [name] PA student and this Probation officer were present. Family was made aware that ECT has been discussed with patient and she has not agree with the procedure. Family reports this was discussed with them while patient was at [facility] and they were not open to ECT. I brought up the possibility of trying Clozaril in this case we discussed some of the adverse side effects of Clozaril. Family reported having concerns and they wanted to read about Clozaril before agreeing with treatment. We decided to touch base again on Tuesday of next week. Family is now concerned about the possibility that the symptoms of psychosis were triggered by quinolone toxicity as patient received this medication back in November when admitted with COPD exacerbation. However in discussing this with the patient and she reports that she was having daily thoughts that she had stolen money and from her employer even prior to her admission to the hospital at that time.  June 28: Family meeting was held. Patient's 2 sons, Judson Roch Building control surveyor and myself were present. Family was updated as patient has had limited improvement since admission. They both agree with treatment with Clozaril. Careful review of the indications, currently off label for delusional disorder, and side effects was discussed in detail with the patient's family.  Consults during 06/16 Internal medicine and pulmonology: Internal medicine saw the patient due to COPD. She was started on a prednisone taper , which she completed on 6/26. They involved pulmonology as pt had a nodule on chest CT. I spoke with internal medicine 6/24. The nodule has been unchanged for several years. They don't feel that there  is any need for any procedures at this time. They signed off. Pulmonology agrees that nodule has not changed since 2011--likely benign.  ID: ID: Family had significant insistence about addressing this issue. Family concern with possible Lyme's disease. Infectious disease evaluated the patient on 6/20. They repeated Lyme serology (results were neg)and started a trial of doxycycline for 10 days which  she will complete on 6/29. ID feels is very unlikely patient is actually suffering from Lyme's disease.  No need to continue any follow-up with ID.   July 2016  Treatment Plan 07/16 Major depressive disorder, recurrent, severe/delusional disorder persecutory type.  To decrease side effects of Clozaril with having to lose efficacy due to dose reduction, it was decided to start the patient on a combination of Luvox and Clozaril. The dose of Clozaril was decreased to 5m and she was started on Luvox 25 mg. On this combination patient appeared overly sedated and is started complaining of severe weakness. She stopped eating or leaving her room. Therefore the medications were held. And then changed to Clozaril 25 mg and Luvox 50.  GAD: continue xanax 0.25 mg tid with meals   Vitamin D deficiency : Continue Vit D   COPD: The patient is on Spiriva and duoneb. Continue 2 L oxygen at bedtime and when necessary shortness of breath.  Tobacco use disorder: Continue nicotine patch  Constipation: Patient was started on Senokot 2 tablets by mouth daily at bedtime and Colace 200 mg by mouth twice a day. Despite his taking these 2 agents patient continued to have issues with constipation. She was treated with MiraLAX every other day with fair response.  Chest pain: On 7/20 patient complaining of shortness of breath. She had elevation of troponins. She was transferred to the medical floor as she had an MI with no ST elevation. A call and That there is some were completed. The patient was restarted on Lipitor,  aspirin, metoprolol and NTG prn and transferred back to psychiatry. Patient was seen again by internal medicine and by cardiology.  The patient was then discharged from our facility on July 27. The patient was said to return to a friend's house and was going to monitor her and her medications. She was set up with home health and was supposed to follow up with Dr. WJimmye Normanoutpatient. Her Clozaril level at discharge was 55 and her total Clozaril level was 88. The patient left the hospital by returned to our emergency department less than 18 hours later. The patient had not fill out any of her medications and therefore returned to the emergency room. Apparently the patient, due to her delusional thinking, stated that she did not have any money to pay for her medications. The patient was readmitted to behavioral health.  Phone conference with family (07/16) 7/6: Family meeting held with patient's's 2 sons, SValora PiccolosEducation officer, museum and myself. Family was updated about the treatment with Clozaril. They were made aware that the patient had decompensated last week but has improved since Monday.  Family was made aware that [facility].   Consults during the month of July PT: PT was consulted due to patient deconditioning  Chaplain: Spiritual care consult was requested  IM: Patient was hospitalized on the medical floor under the hospitalist service from July 20 to July 23. Patient was followed by cardiology during her stay on the medical floor.  August 2016  Treatment Plan 8/16 Major depressive disorder, recurrent, severe/delusional disorder persecutory type: Patient was continued on Luvox but the dose was changed to 50 mg by mouth twice a day. The Clozaril was increased to 75 mg by mouth twice a day. The Clozaril level was checked on August 3. This level is currently pending.  The patient's  delusional thinking is seen significantly improved the patient was not longer drinking habits to delusional thoughts. However she did have some significant concern about her finances which is likely indicated the patient was still thinking that all her assets were taken away to pay the debt from the money she thought she had stolen. Depressive symptoms however seemed to continue. The patient is withdrawn, will stay in her room and her oral intake is limited. She requires significant encouragement to get out of bed, shower, eat or drink. She will complete these task is she is encourage constantly. She denies suicidality, homicidality or psychosis. There is no evidence of side effects.   Discharge planning Family has arranged for the patient to be admitted to a assisted living facility in [place]. [name] will pick her up on HIS stated 6 and drive her there. We have contacted the assisted living facility have discussed the patient's needs with them. We discussed the need for starting encouragement in order for her to complete activities of daily living (shower, walking, eating attending activities taking medications).  All her prescriptions have been faxed to her pharmacy in [facility] where they can be picked up by family prior to discharge.    Orders for ALF:   Physical therapy: Patient will need rolling walker with 5 inches wheels  Oxygen therapy at bedtime, 2 L  Labs: Weekly CBC with differential for clozapine monitoring. Due on 8/12      Past Medical History:  Past Medical History  Diagnosis Date  . Anxiety   . COPD (chronic obstructive pulmonary disease)   . Anxiety   . 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 Hand surgery    . Appendectomy    . Abdominal hysterectomy    . Cesarean section    . Cardiac catheterization N/A 10/31/2014    Procedure: Left Heart Cath and  Coronary Angiography; Surgeon: Isaias Cowman, MD; Location: [place] [facility]; Service: Cardiovascular; Laterality: N/A;   Family History:  Family History  Problem Relation Age of Onset  . CAD Mother   . Thyroid cancer Other   . Lung cancer Other    Social History:  History  Alcohol Use No    History  Drug Use No    History   Social History  . Marital Status: Single    Spouse Name: N/A  . Number of Children: N/A  . Years of Education: N/A   Social History Main Topics  . Smoking status: Former Smoker -- 0.25 packs/day for 15 years    Types: Cigarettes    Quit date: 09/13/2014  . Smokeless tobacco: Never Used  . Alcohol Use: No  . Drug Use: No  . Sexual Activity: Not on file   Other Topics Concern  . None   Social History Narrative   The patient was born and raised in [place] by both of her biological parents. She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

## 2014-11-14 NOTE — [facility] Group Notes (Signed)
[facility] Group Notes:  (Nursing/MHT/Case Management/Adjunct)  Date:  11/14/2014  Time:  1:15 AM  Type of Therapy:  Group Therapy  Participation Level:  Did Not Attend   Fintan Grater [name] Riyana Biel 11/14/2014, 1:15 AM

## 2014-11-15 NOTE — [facility] Suicide Risk Assessment (Signed)
[facility] Discharge Suicide Risk Assessment   Demographic Factors:  Age 64 or older, Divorced or widowed and Caucasian  Total Time spent with patient: 30 minutes  Musculoskeletal: Strength & Muscle Tone: decreased Gait & Station: unsteady Patient leans: Front  Psychiatric Specialty Exam: Physical Exam  ROS   Has this patient used any form of tobacco in the last 30 days? (Cigarettes, Smokeless Tobacco, Cigars, and/or Pipes) No  Mental Status Per Nursing Assessment::   On Admission:     Current Mental Status by Physician: less depressed and less hopeless no longer voicing delusions.  No prior h/o SA.  No h/o substance abuse.  Will be d/c to a ALF.  Loss Factors: Decline in physical health  Historical Factors: NA  Risk Reduction Factors:   Sense of responsibility to family and Positive social support  Continued Clinical Symptoms:  Depression:   Anhedonia Severe More than one psychiatric diagnosis  Cognitive Features That Contribute To Risk:  Polarized thinking    Suicide Risk:  Minimal: No identifiable suicidal ideation.  Patients presenting with no risk factors but with morbid ruminations; may be classified as minimal risk based on the severity of the depressive symptoms  Principal Problem: Major depressive disorder, recurrent episode, severe with catatonia Discharge Diagnoses:  Patient Active Problem List   Diagnosis Date Noted  . Coronary artery disease [I25.10] 11/06/2014  . NSTEMI (non-ST elevated myocardial infarction) [I21.4] 10/29/2014  . Protein-calorie malnutrition, severe [E43] 10/29/2014  . 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 COPD (chronic obstructive pulmonary disease) [J44.9] 09/12/2014  . GAD (generalized anxiety disorder) [F41.1]     Follow-up Information    Follow up with [facility]. Go in 3 days.    Why:  For follow-up care; initial appointment to get registered within 7 days of hospital discharge; walk in appts M-W 8am-6pm and Th-F 8am-5pm; walk in for appointment on Tuesday 11/18/14 at 8:00am to register for mental health services   Contact [place] [address] Ph [phone] Fax [phone]        Is patient on multiple antipsychotic therapies at discharge:  No   Has Patient had three or more failed trials of antipsychotic monotherapy by history:  No  Recommended Plan for Multiple Antipsychotic Therapies: NA    Hildred Priest 11/15/2014, 7:09 AM

## 2014-11-15 NOTE — Plan of Care (Signed)
Problem: Alteration in mood Goal: LTG-Patient reports reduction in suicidal thoughts (Patient reports reduction in suicidal thoughts and is able to verbalize a safety plan for whenever patient is feeling suicidal)  Outcome: Progressing Pt denies SI.  Goal: LTG-Pt's behavior demonstrates decreased signs of depression (Patient's behavior demonstrates decreased signs of depression to the point the patient is safe to return home and continue treatment in an outpatient setting)  Outcome: Not Progressing Patient's behavior continues to indicate depression.  Goal: STG-Patient is able to discuss feelings and issues (Patient is able to discuss feelings and issues leading to depression)  Outcome: Not Progressing Patient declines conversation about feelings and issues.

## 2014-11-15 NOTE — Progress Notes (Signed)
Patient is pleasant and cooperative. She continues to c/o no energy. Although she declines extended conversation, her statements are not as hopeless as in prior conversations, i.e. No statements of being doomed or banished to hell. She is med compliant. She denies current SI, HI, and AVH. Pt will continue to monitored q 15 minutes for safety at at night when she has her oxygen tubing she is monitored continuously.

## 2014-11-15 NOTE — Progress Notes (Signed)
Pt discharged home. DC instructions provided and explained. Medications reviewed and explained to son in detail when they should be given and what the intended use. Rx given. All questions answered. Patients son was angry and cursing during discharge because moms things were not here. Security explained that when patient was admitted, a few items were kept on the unit and the others were sent back with family members. Son states, "It makes him angry, because this has made a difficult day even more difficult, Now I have to buy mom more things". This Probation officer apologized to family member for his concerns, patient left in no distress.

## 2014-11-17 NOTE — Progress Notes (Signed)
AVS H&P Discharge Summary faxed to [facility] for hospital follow-up

## 2014-11-18 LAB — VITAMIN D 1,25 DIHYDROXY
Vitamin D 1, 25 (OH)2 Total: 60 pg/mL
Vitamin D2 1, 25 (OH)2: <10 pg/mL
Vitamin D3 1, 25 (OH)2: 60 pg/mL

## 2016-01-05 IMAGING — CT CT HEAD W/O CM
1 series · 16 of 30 positions shown, 20 images · non-contrast
Comparison: 03/04/2014

CLINICAL DATA: Generalized weakness for 1 month, altered mental
status

EXAM:
CT HEAD WITHOUT CONTRAST
TECHNIQUE: Contiguous axial images were obtained from the base of the skull
through the vertex without intravenous contrast.

[Series 2: head 5.0 h30s · axial · 0.41mm/px · z∈[-177,-42]mm · 16 of 31 slices shown, 20 images]
[im 2/31  brain]
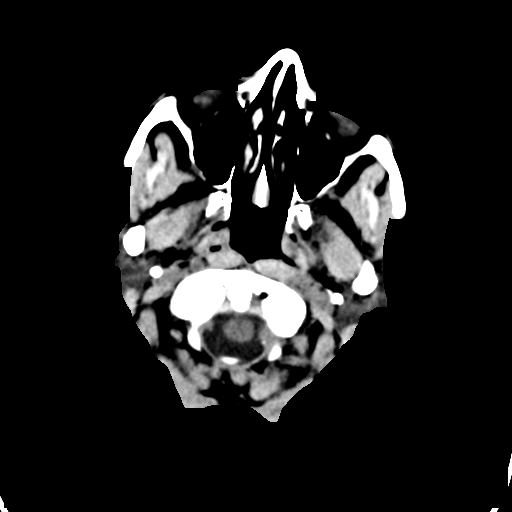
[im 2/31  bone]
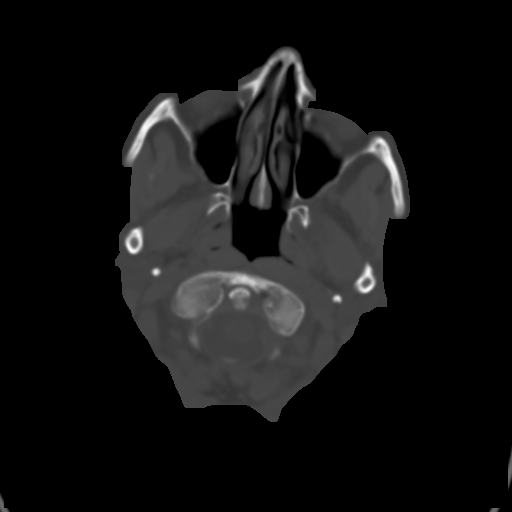
[im 4/31  brain]
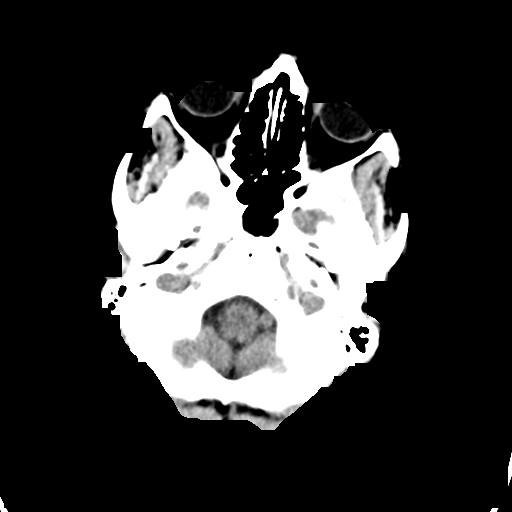
[im 6/31  brain]
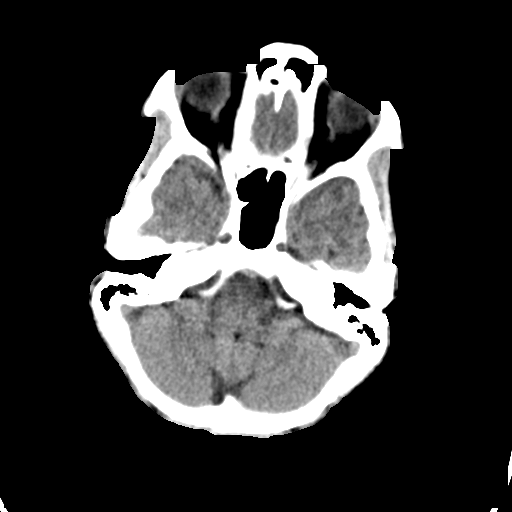
[im 8/31  brain]
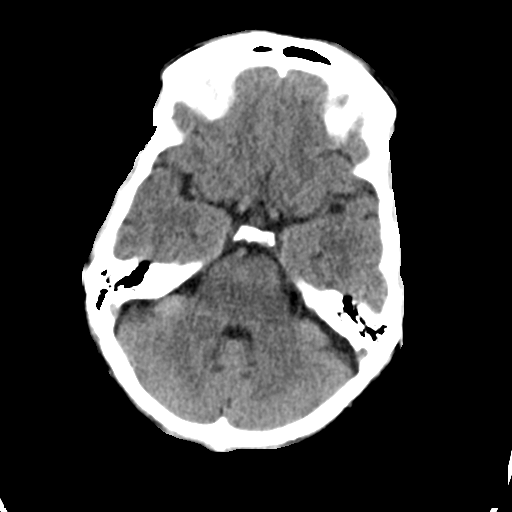
[im 9/31  brain]
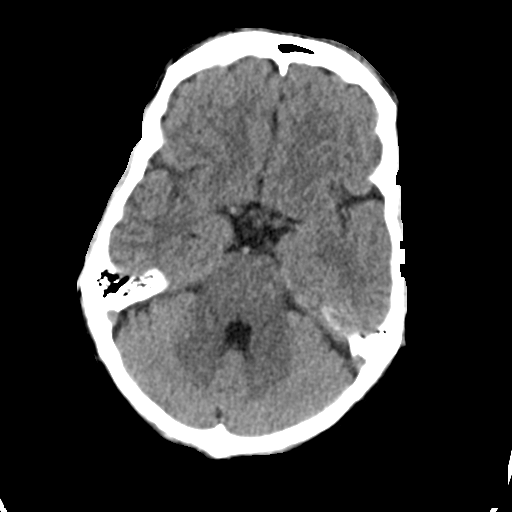
[im 9/31  bone]
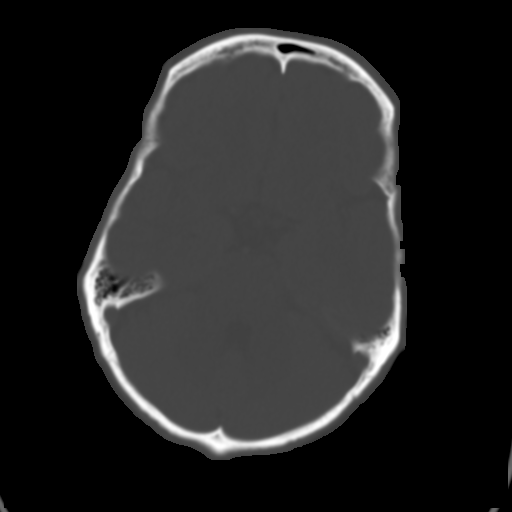
[im 11/31  brain]
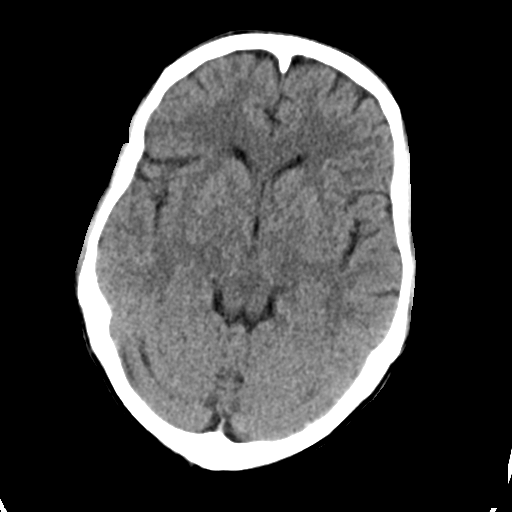
[im 13/31  brain]
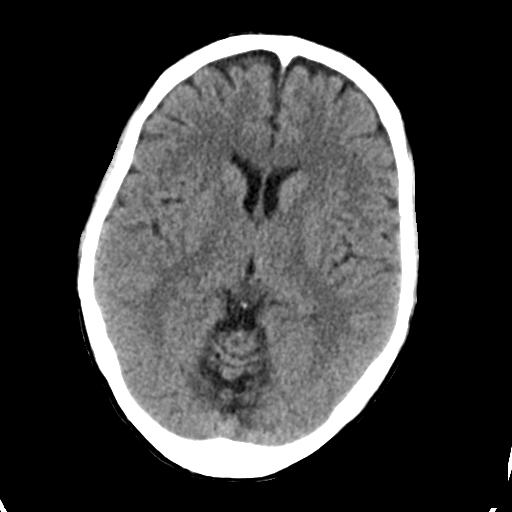
[im 15/31  brain]
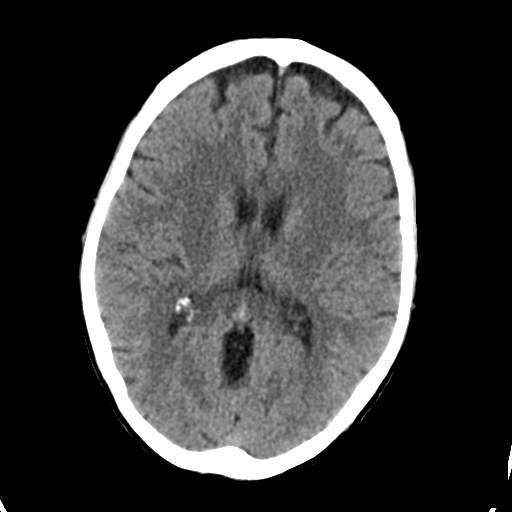
[im 16/31  brain]
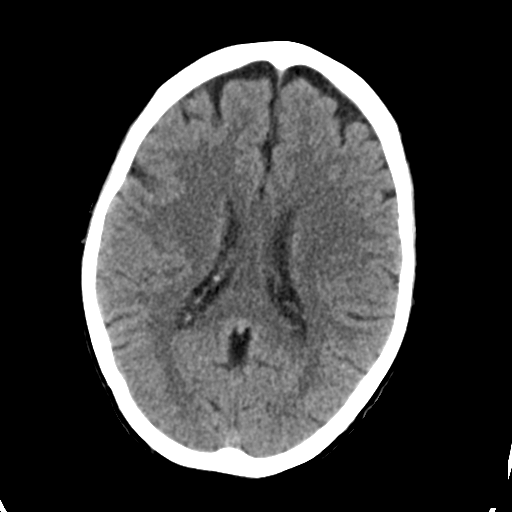
[im 16/31  bone]
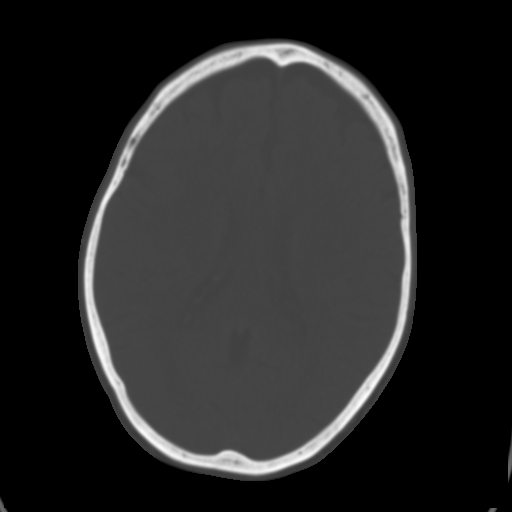
[im 18/31  brain]
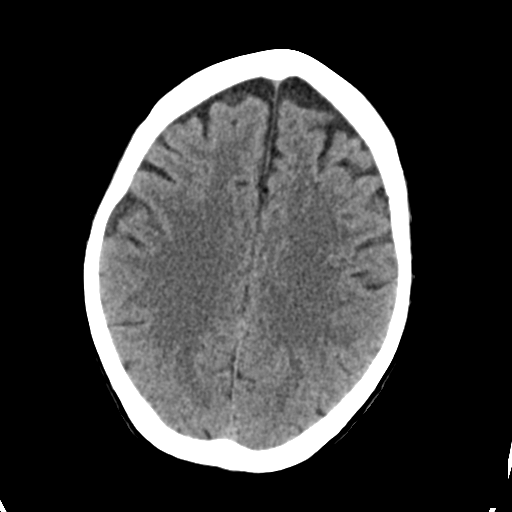
[im 20/31  brain]
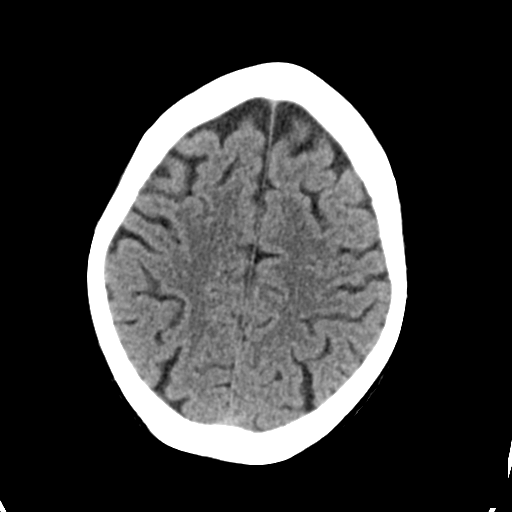
[im 22/31  brain]
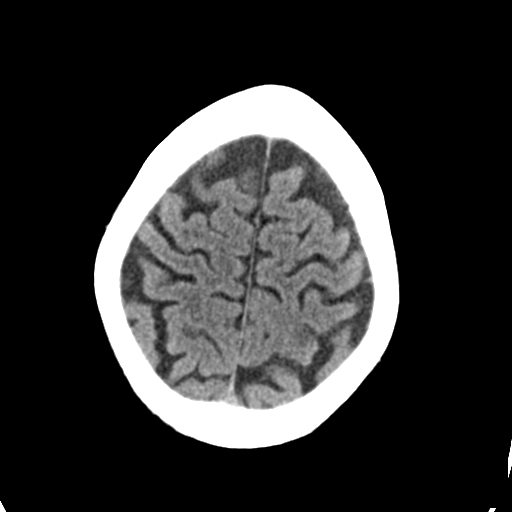
[im 23/31  brain]
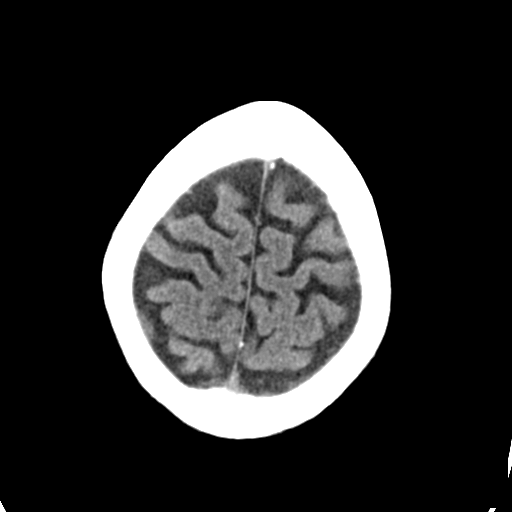
[im 23/31  bone]
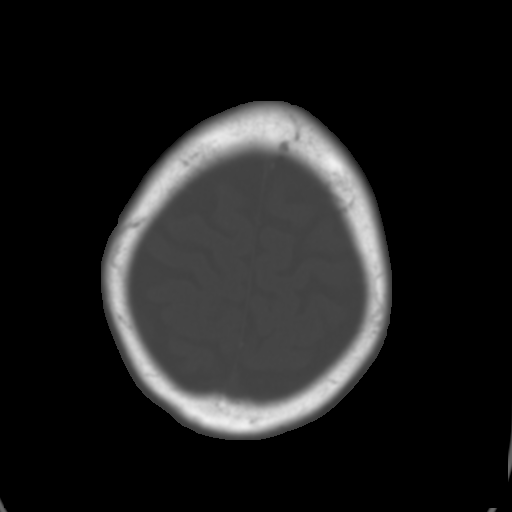
[im 25/31  brain]
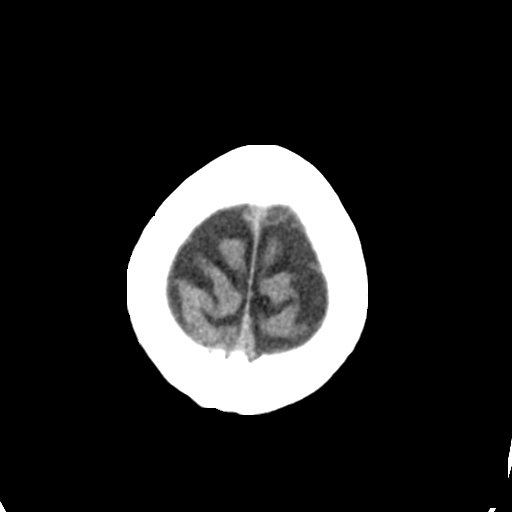
[im 27/31  brain]
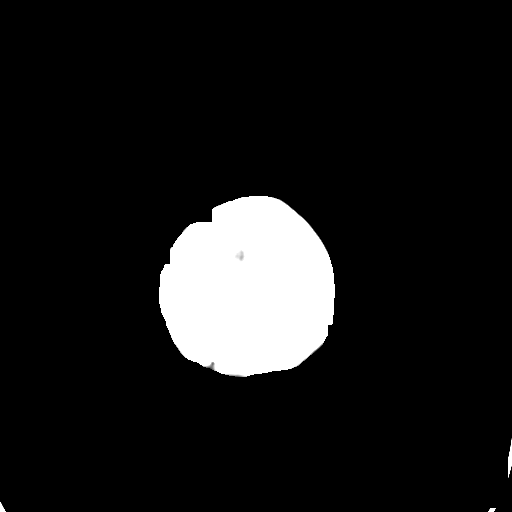
[im 29/31  brain]
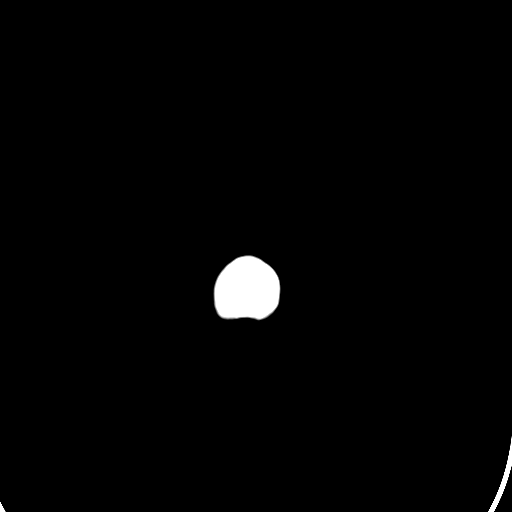

[16 of 30 positions shown; findings below may reference images not displayed]

FINDINGS: No skull fracture is noted. Paranasal sinuses and mastoid air cells
are unremarkable.

No acute cortical infarction.

No mass lesion is noted on this unenhanced scan. Mild cerebral
atrophy. The gray and white-matter differentiation is preserved. No
hydrocephalus.
IMPRESSION: No acute intracranial abnormality.  Mild cerebral atrophy.

## 2016-05-30 ENCOUNTER — Encounter: Admit: 2016-05-31 | Primary: Internal Medicine

## 2016-05-30 DIAGNOSIS — F200 Paranoid schizophrenia: Secondary | ICD-10-CM

## 2016-05-30 NOTE — ED Notes (Signed)
Pt. Medicated for ha, assisted with removal of dentures and denies further needs at this time.      Vinetta BergamoKaitlin M Shalita Notte, RN  05/30/16 [phone]

## 2016-05-30 NOTE — ED Notes (Signed)
Notified MD pt. W/c/o ha.     Vinetta BergamoKaitlin M Kyrian Stage, RN  05/30/16 [phone]

## 2016-05-30 NOTE — ED Notes (Signed)
Pt. Returned from radiology, aware of plan for monitoring at this time an denies further needs. Will cont. To monitor.      Vinetta Bergamo, RN  05/30/16 [phone]

## 2016-05-30 NOTE — ED Notes (Signed)
Pt. Is alert and oriented, calm and cooperative at this time and is aware of plan for observation. Denies further needs.      Vinetta BergamoKaitlin M Lonzie Simmer, RN  05/30/16 [phone]
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
## 2016-05-31 ENCOUNTER — Inpatient Hospital Stay
Admit: 2016-05-31 | Discharge: 2016-05-31 | Disposition: A | Discharge status: Home / Self Care | Payer: MEDICARE | Attending: Emergency Medicine

## 2016-05-31 LAB — COMPREHENSIVE METABOLIC PANEL
ALT: 8 U/L — ABNORMAL LOW (ref 10–40)
AST: 13 U/L — ABNORMAL LOW (ref 15–37)
Albumin/Globulin Ratio: 1.9 (ref 1.1–2.2)
Albumin: 4.5 g/dL (ref 3.4–5.0)
Alkaline Phosphatase: 66 U/L (ref 40–129)
Anion Gap: 11 (ref 3–16)
BUN: 12 mg/dL (ref 7–20)
CO2: 29 mmol/L (ref 21–32)
Calcium: 9.3 mg/dL (ref 8.3–10.6)
Chloride: 99 mmol/L (ref 99–110)
Creatinine: <0.5 mg/dL — ABNORMAL LOW (ref 0.6–1.2)
GFR African American: >60 (ref >60)
GFR Non-African American: >60 (ref >60)
Globulin: 2.4 g/dL
Glucose: 102 mg/dL — ABNORMAL HIGH (ref 70–99)
Potassium: 3.8 mmol/L (ref 3.5–5.1)
Sodium: 139 mmol/L (ref 136–145)
Total Bilirubin: 0.3 mg/dL (ref 0.0–1.0)
Total Protein: 6.9 g/dL (ref 6.4–8.2)

## 2016-05-31 LAB — CBC WITH AUTO DIFFERENTIAL
Basophils %: 0.6 %
Basophils Absolute: 0.1 10*3/uL (ref 0.0–0.2)
Eosinophils %: 0.2 %
Eosinophils Absolute: 0 10*3/uL (ref 0.0–0.6)
Hematocrit: 40.9 % (ref 36.0–48.0)
Hemoglobin: 14 g/dL (ref 12.0–16.0)
Lymphocytes %: 14.4 %
Lymphocytes Absolute: 1.3 10*3/uL (ref 1.0–5.1)
MCH: 31.2 pg (ref 26.0–34.0)
MCHC: 34.2 g/dL (ref 31.0–36.0)
MCV: 91.3 fL (ref 80.0–100.0)
MPV: 6.8 fL (ref 5.0–10.5)
Monocytes %: 8 %
Monocytes Absolute: 0.7 10*3/uL (ref 0.0–1.3)
Neutrophils %: 76.8 %
Neutrophils Absolute: 6.8 10*3/uL (ref 1.7–7.7)
Platelets: 224 10*3/uL (ref 135–450)
RBC: 4.49 M/uL (ref 4.00–5.20)
RDW: 12.3 % — ABNORMAL LOW (ref 12.4–15.4)
WBC: 8.8 10*3/uL (ref 4.0–11.0)

## 2016-05-31 LAB — SALICYLATE LEVEL: Salicylate, Serum: <0.3 mg/dL — ABNORMAL LOW (ref 15.0–30.0)

## 2016-05-31 LAB — URINALYSIS
Bilirubin Urine: NEGATIVE
Blood, Urine: NEGATIVE
Glucose, Ur: NEGATIVE mg/dL
Leukocyte Esterase, Urine: NEGATIVE
Nitrite, Urine: NEGATIVE
Protein, UA: NEGATIVE mg/dL
Specific Gravity, UA: 1.015 (ref 1.005–1.030)
Urobilinogen, Urine: 0.2 E.U./dL (ref <2.0)
pH, UA: 6 (ref 5.0–8.0)

## 2016-05-31 LAB — ACETAMINOPHEN LEVEL: Acetaminophen Level: <15 ug/mL (ref 10–30)

## 2016-05-31 LAB — URINE DRUG SCREEN
Amphetamine Screen, Urine: NEGATIVE
Barbiturate Screen, Ur: NEGATIVE (ref <200)
Benzodiazepine Screen, Urine: POSITIVE — AB (ref <200)
Cannabinoid Scrn, Ur: NEGATIVE (ref <50)
Cocaine Metabolite Screen, Urine: NEGATIVE (ref <300)
Methadone Screen, Urine: NEGATIVE (ref <300)
Opiate Scrn, Ur: NEGATIVE (ref <300)
Oxycodone Urine: NEGATIVE (ref <100)
PCP Screen, Urine: NEGATIVE (ref <25)
Propoxyphene Scrn, Ur: NEGATIVE (ref <300)
pH, UA: 6

## 2016-05-31 LAB — ETHANOL: Ethanol Lvl: NOT DETECTED mg/dL

## 2016-05-31 MED ORDER — ACETAMINOPHEN 500 MG PO TABS
500 MG | Freq: Once | ORAL | Status: AC
Start: 2016-05-31 — End: 2016-05-30
  Administered 2016-05-31: 05:00:00 1000 mg via ORAL

## 2016-05-31 MED FILL — MAPAP 500 MG PO TABS: 500 MG | ORAL | Qty: 2

## 2016-05-31 NOTE — ED Triage Notes (Signed)
Pt. Is alert and oriented and is aware of plan for transfer to long term care facility.

## 2016-05-31 NOTE — Discharge Instructions (Signed)
The crisis number for [facility] is [phone] (SAVE). This crisis line is available 24 hours a day, seven days a week.    A National Crisis Number is 1-800-SUICIDE 709 157 4655(1-743-623-0390).  You can use this number at any time to access emergency mental health services.    Please follow up with your outpatient services, [facility] at [facility]. Please schedule a follow up appointment as soon as possible

## 2016-05-31 NOTE — ED Triage Notes (Signed)
nsg report given to [facility] at extended care facility.

## 2016-05-31 NOTE — ED Notes (Signed)
Upon discharge pt stated that she was not going back to the home... Explained that she had been discharge and would need to follow-ups; pt asked if she could stay in a hotel; explained that hospital could not discharge her to a hotel.    Dr. Willadean Carol to speak with pt.     Clide Cliff, RN  05/31/16 [phone]

## 2016-06-01 LAB — EKG 12-LEAD
Atrial Rate: 110 {beats}/min
P Axis: 81 degrees
P-R Interval: 140 ms
Q-T Interval: 348 ms
QRS Duration: 98 ms
QTc Calculation (Bazett): 470 ms
R Axis: 83 degrees
T Axis: 75 degrees
Ventricular Rate: 110 {beats}/min

## 2016-06-06 ENCOUNTER — Encounter: Admit: 2016-06-06 | Primary: Internal Medicine

## 2016-06-06 ENCOUNTER — Inpatient Hospital Stay
Admit: 2016-06-06 | Discharge: 2016-06-07 | Disposition: A | Discharge status: Other Acute Inpatient Hospital | Payer: MEDICARE | Attending: Emergency Medicine

## 2016-06-06 DIAGNOSIS — E876 Hypokalemia: Secondary | ICD-10-CM

## 2016-06-06 LAB — COMPREHENSIVE METABOLIC PANEL
ALT: 9 U/L — ABNORMAL LOW (ref 10–40)
AST: 13 U/L — ABNORMAL LOW (ref 15–37)
Albumin/Globulin Ratio: 1.9 (ref 1.1–2.2)
Albumin: 4.2 g/dL (ref 3.4–5.0)
Alkaline Phosphatase: 61 U/L (ref 40–129)
Anion Gap: 8 (ref 3–16)
BUN: 11 mg/dL (ref 7–20)
CO2: 33 mmol/L — ABNORMAL HIGH (ref 21–32)
Calcium: 9.1 mg/dL (ref 8.3–10.6)
Chloride: 99 mmol/L (ref 99–110)
Creatinine: <0.5 mg/dL — ABNORMAL LOW (ref 0.6–1.2)
GFR African American: >60 (ref >60)
GFR Non-African American: >60 (ref >60)
Globulin: 2.2 g/dL
Glucose: 93 mg/dL (ref 70–99)
Potassium: 3.2 mmol/L — ABNORMAL LOW (ref 3.5–5.1)
Sodium: 140 mmol/L (ref 136–145)
Total Bilirubin: 0.3 mg/dL (ref 0.0–1.0)
Total Protein: 6.4 g/dL (ref 6.4–8.2)

## 2016-06-06 LAB — CBC WITH AUTO DIFFERENTIAL
Basophils %: 0.9 %
Basophils Absolute: 0.1 10*3/uL (ref 0.0–0.2)
Eosinophils %: 2.1 %
Eosinophils Absolute: 0.1 10*3/uL (ref 0.0–0.6)
Hematocrit: 38.1 % (ref 36.0–48.0)
Hemoglobin: 13.2 g/dL (ref 12.0–16.0)
Lymphocytes %: 31 %
Lymphocytes Absolute: 1.8 10*3/uL (ref 1.0–5.1)
MCH: 31.6 pg (ref 26.0–34.0)
MCHC: 34.6 g/dL (ref 31.0–36.0)
MCV: 91.3 fL (ref 80.0–100.0)
MPV: 7 fL (ref 5.0–10.5)
Monocytes %: 7.9 %
Monocytes Absolute: 0.4 10*3/uL (ref 0.0–1.3)
Neutrophils %: 58.1 %
Neutrophils Absolute: 3.3 10*3/uL (ref 1.7–7.7)
Platelets: 224 10*3/uL (ref 135–450)
RBC: 4.18 M/uL (ref 4.00–5.20)
RDW: 12.3 % — ABNORMAL LOW (ref 12.4–15.4)
WBC: 5.7 10*3/uL (ref 4.0–11.0)

## 2016-06-06 LAB — URINE DRUG SCREEN
Amphetamine Screen, Urine: NEGATIVE
Barbiturate Screen, Ur: NEGATIVE (ref <200)
Benzodiazepine Screen, Urine: POSITIVE — AB (ref <200)
Cannabinoid Scrn, Ur: NEGATIVE (ref <50)
Cocaine Metabolite Screen, Urine: NEGATIVE (ref <300)
Methadone Screen, Urine: NEGATIVE (ref <300)
Opiate Scrn, Ur: NEGATIVE (ref <300)
Oxycodone Urine: NEGATIVE (ref <100)
PCP Screen, Urine: NEGATIVE (ref <25)
Propoxyphene Scrn, Ur: NEGATIVE (ref <300)
pH, UA: 6

## 2016-06-06 LAB — URINALYSIS
Bilirubin Urine: NEGATIVE
Blood, Urine: NEGATIVE
Glucose, Ur: NEGATIVE mg/dL
Ketones, Urine: NEGATIVE mg/dL
Leukocyte Esterase, Urine: NEGATIVE
Nitrite, Urine: NEGATIVE
Protein, UA: NEGATIVE mg/dL
Specific Gravity, UA: <=1.005 (ref 1.005–1.030)
Urobilinogen, Urine: 0.2 E.U./dL (ref <2.0)
pH, UA: 6 (ref 5.0–8.0)

## 2016-06-06 LAB — SALICYLATE LEVEL: Salicylate, Serum: 0.5 mg/dL — ABNORMAL LOW (ref 15.0–30.0)

## 2016-06-06 LAB — ACETAMINOPHEN LEVEL: Acetaminophen Level: <15 ug/mL (ref 10–30)

## 2016-06-06 LAB — ETHANOL: Ethanol Lvl: NOT DETECTED mg/dL

## 2016-06-06 MED ORDER — ACETAMINOPHEN 325 MG PO TABS
325 MG | Freq: Once | ORAL | Status: AC
Start: 2016-06-06 — End: 2016-06-06
  Administered 2016-06-06: 21:00:00 650 mg via ORAL

## 2016-06-06 MED ORDER — POTASSIUM CHLORIDE CRYS ER 20 MEQ PO TBCR
20 MEQ | Freq: Once | ORAL | Status: AC
Start: 2016-06-06 — End: 2016-06-06
  Administered 2016-06-06: 21:00:00 40 meq via ORAL

## 2016-06-06 MED FILL — TYLENOL 325 MG PO TABS: 325 MG | ORAL | Qty: 2

## 2016-06-06 MED FILL — POTASSIUM CHLORIDE CRYS ER 20 MEQ PO TBCR: 20 MEQ | ORAL | Qty: 2

## 2016-06-06 NOTE — ED Notes (Signed)
Presented patient to Dr. Dutch QuintEppley. [facility].      Caitlyn EddyKristy Honeycutt, RN  06/06/16 [phone]

## 2016-06-06 NOTE — ED Provider Notes (Signed)
[facility] ED  eMERGENCY dEPARTMENT eNCOUnter        Pt Name: Caitlyn Morgan  MRN: [id number]  Birthdate [birth year]  Date of evaluation: 06/06/2016  Provider: Lethea Killings, NP  PCP: [facility]  ED Attending: Nuala Alpha, MD    CHIEF COMPLAINT       Chief Complaint   Patient presents with   ??? Psychiatric Evaluation     pt from assisted living, pt is flushing her meds down the toilet, pt is very paranoid and stating she is wearing a wire because the FBI is monitoring her. Pt states the "ranch" she lives at is trying to poison her through their [place].        HISTORY OF PRESENT ILLNESS   (Location/Symptom, Timing/Onset, Context/Setting, Quality, Duration, Modifying Factors, Severity)  Note limiting factors.     Caitlyn Morgan is a 66 y.o. female for paranoid behavior. Onset was last few days. Duration has been since the onset. 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  Patient denies any suicidal or homicidal ideations. Alleviating factors include nothing. Aggravating factors include nothing. Pain is 0/10.  Nothing has been used for pain today.    Nursing Notes were all reviewed and agreed with or any disagreements were addressed  in the HPI.    REVIEW OF SYSTEMS    (2-9 systems for level 4, 10 or more for level 5)     Review of Systems   Constitutional: Negative for fever.   Respiratory: Negative for shortness of breath.    Cardiovascular: Negative for chest pain.   Gastrointestinal: Negative for abdominal pain.   Genitourinary: Negative for difficulty urinating.   Psychiatric/Behavioral: Negative for self-injury and suicidal ideas.        Paranoid behavior   All other systems reviewed and are negative.      Positives and Pertinent negatives as per HPI.  Except as noted above in the ROS, all other systems were reviewed and negative.       PAST MEDICAL HISTORY     Past Medical History:   Diagnosis Date   ??? Bipolar  1 disorder (HCC)    ??? Depression    ??? Hyperlipidemia          SURGICAL HISTORY       Past Surgical History:   Procedure Laterality Date   ??? APPENDECTOMY     ??? CESAREAN SECTION     ??? HYSTERECTOMY     ??? THYROID SURGERY           CURRENT MEDICATIONS       Discharge Medication List as of 06/06/2016  8:57 PM      CONTINUE these medications which have NOT CHANGED    Details   chlorproMAZINE (THORAZINE) 10 MG tablet Take 15 mg by mouth 2 times dailyHistorical Med      aspirin 81 MG tablet Take 81 mg by mouth dailyHistorical Med      folic acid-pyridoxine-cyanocobalamine (FOLTX) 2.5-25-1 MG TABS tablet Take 1 tablet by mouth dailyHistorical Med      ferrous gluconate (FERGON) 324 (38 Fe) MG tablet Take 324 mg by mouth daily (with breakfast)Historical Med      PARoxetine (PAXIL) 20 MG tablet Take 20 mg by mouth every morningHistorical Med      pantoprazole (PROTONIX) 40 MG tablet Take 40 mg by mouth dailyHistorical Med      atorvastatin (LIPITOR) 20 MG tablet Take  20 mg by mouth dailyHistorical Med      calcium citrate (CALCITRATE) 950 MG tablet Take 1 tablet by mouth dailyHistorical Med      senna (SENOKOT) 8.6 MG tablet Take 2 tablets by mouth nightly Historical Med      acetaminophen (TYLENOL) 325 MG tablet Take 650 mg by mouth 2 times daily Historical Med      fluvoxaMINE (LUVOX) 50 MG tablet Take 50 mg by mouth 3 times daily Historical Med      ALPRAZolam (XANAX) 0.25 MG tablet Take 0.25 mg by mouth 3 times daily.Historical Med      traZODone (DESYREL) 100 MG tablet Take 75 mg by mouth nightly Historical Med      guaiFENesin (MUCINEX) 600 MG extended release tablet Take 600 mg by mouth 2 times dailyHistorical Med      cetirizine (ZYRTEC) 10 MG tablet Take 10 mg by mouth dailyHistorical Med      cefUROXime (CEFTIN) 500 MG tablet Take 500 mg by mouth 2 times daily Times 30 daysHistorical Med               ALLERGIES     Ciprofloxacin; Nickel; Sulfa antibiotics; and Morphine    FAMILY HISTORY     History reviewed. No  pertinent family history.       SOCIAL HISTORY       Social History     Social History   ??? Marital status: Unknown     Spouse name: N/A   ??? Number of children: N/A   ??? Years of education: N/A     Social History Main Topics   ??? Smoking status: Current Some Day Smoker     Packs/day: 0.25     Types: Cigarettes   ??? Smokeless tobacco: Never Used   ??? Alcohol use None   ??? Drug use: No   ??? Sexual activity: No     Other Topics Concern   ??? None     Social History Narrative   ??? None       SCREENINGS    Glasgow Coma Scale  Eye Opening: Spontaneous  Best Verbal Response: Oriented  Best Motor Response: Obeys commands  Glasgow Coma Scale Score: 15        PHYSICAL EXAM    (up to 7 for level 4, 8 or more for level 5)     ED Triage Vitals [06/06/16 1304]   BP Temp Temp Source Pulse Resp SpO2 Height Weight   129/75 97.6 ??F (36.4 ??C) Oral 106 16 99 % 5\' 2"  (1.575 m) 101 lb (45.8 kg)       Physical Exam   Constitutional: She is oriented to person, place, and time. She appears well-developed and well-nourished.   HENT:   Head: Normocephalic and atraumatic.   Neck: Normal range of motion.   Cardiovascular: Normal rate.    Pulmonary/Chest: Effort normal. No respiratory distress.   Abdominal: Soft. She exhibits no distension.   Musculoskeletal: Normal range of motion.   Neurological: She is alert and oriented to person, place, and time.   Skin: Skin is warm and dry.   Psychiatric: Her speech is normal. She expresses no homicidal and no suicidal ideation. She expresses no suicidal plans and no homicidal plans.       DIAGNOSTIC RESULTS   LABS:    Labs Reviewed   CBC WITH AUTO DIFFERENTIAL - Abnormal; Notable for the following:        Result Value    RDW 12.3 (*)  All other components within normal limits    Narrative:     Performed at:  [facility]  [address],  [place], [place] [address]   Phone [phone]   COMPREHENSIVE METABOLIC PANEL - Abnormal; Notable for the following:     Potassium 3.2 (*)      CO2 33 (*)     CREATININE <0.5 (*)     ALT 9 (*)     AST 13 (*)     All other components within normal limits    Narrative:     Performed at:  [facility]  [address],  [place], [place] [address]   Phone [phone]   SALICYLATE LEVEL - Abnormal; Notable for the[facility] following:     Salicylate, Serum 0.5 (*)     All other components within normal limits    Narrative:     Performed at:  [facility]  [address],  [place], [place] [address]   Phone [phone]   URINE DRUG SCREEN - Abnormal; Notable for the following:     Benzodiazepine Screen, Urine POSITIVE (*)     All other components within normal limits    Narrative:     Performed at:  [facility]  [address],  [place], [place] [address]   Phone [phone]   ACETAMINOPHEN LEVEL    Narrative:     Performed at:  [facility]  [address],  [place], [place] [address]   Phone [phone]   ETHANOL    Narrative:     Performed at:  [facility]  [address],  [place], [place] [address]   Phone [phone]   URINALYSIS    Narrative:     Performed at:  [facility]  [address],  [place], [place] [address]   Phone [phone]       All other labs were within normal range or not returned as of this dictation.    EKG: All EKG's are interpreted by the Emergency Department Physician who either signs or Co-signs this chart in the absence of a cardiologist.  Please see their note for interpretation of EKG.      RADIOLOGY:   Non-plain film images such as CT, Ultrasound and MRI are read by the radiologist. Plain radiographic images are visualized and preliminarily interpreted by the  ED Provider with the below findings:    Head CT without contrast interpreted by radiologist for no acute intracranial abnormality.    Chest x-ray interpreted by radiologist for COPD with no acute findings  or interval change.    Interpretation per the Radiologist below, if available at the time of this note:    CT Head WO Contrast   Final Result   No acute intracranial abnormality.         XR CHEST STANDARD (2 VW)   Final Result   COPD with no acute finding or interval change.           No results found.      PROCEDURES   Unless otherwise noted below, none     Procedures    CRITICAL CARE TIME   N/A    CONSULTS:  None      EMERGENCY DEPARTMENT COURSE and DIFFERENTIAL DIAGNOSIS/MDM:   Vitals:    Vitals:    06/06/16 1541 06/06/16 1830 06/06/16  2000 06/06/16 2001   BP: (!) 123/38 (!) 166/97 (!) 138/43 (!) 138/43   Pulse: 84 126 91 91   Resp: 16 16 16 18    Temp:       TempSrc:       SpO2: 100% 96% 100% 100%   Weight:       Height:           Patient was given the following medications:  Medications   potassium chloride (KLOR-CON M) extended release tablet 40 mEq (40 mEq Oral Given 06/06/16 1539)   acetaminophen (TYLENOL) tablet 650 mg (650 mg Oral Given 06/06/16 1539)       Patient was seen and evaluated by Dr. Rosalia Hammers and myself.  Patient here after having paranoid thoughts.  Patient reports that she lives in a senior home however states that she feels that she's jail and therefore leaving her in jail.  She denies any suicidal or homicidal ideations.  Lab values head CT with all been reviewed and interpreted.  Patient states that she has been using oxygen more frequently.  At this time the patient's been considered medically cleared and been consulted with behavior health for evaluation and assistance a final disposition.    The patient tolerated their visit well.  They were seen and evaluated by the attending physician, Nuala Alpha, MD who agreed with the assessment and plan.  The patient and / or the family were informed of the results of any tests, a time was given to answer questions, a plan was proposed and they agreed with plan.        FINAL IMPRESSION      1. Hypokalemia    2. Paranoid behavior (HCC)    3. History of COPD     4. Oxygen dependent          DISPOSITION/PLAN   DISPOSITION        PATIENT REFERRED TO:  [facility]  [address]  [place] [address]  [phone]    Schedule an appointment as soon as possible for a visit in 2 days  for re-evaluation    [facility] ED  [address]  [place] [address]  [phone]    If symptoms worsen      DISCHARGE MEDICATIONS:  Discharge Medication List as of 06/06/2016  8:57 PM          DISCONTINUED MEDICATIONS:  Discharge Medication List as of 06/06/2016  8:57 PM                 (Please note that portions of this note were completed with a voice recognition program.  Efforts were made to edit the dictations but occasionally words are mis-transcribed.)    Lethea Killings, NP (electronically signed)     Lethea Killings, NP  06/06/16 1536       Lethea Killings, NP  06/07/16 1001

## 2016-06-06 NOTE — ED Notes (Signed)
Spoke with Advertising account executivestaff and director at [facility]. Reports patient has been paranoid and has been hallucinating. Reports she believes people there are trying to kill her and she is wired by the FBI. Reports she selectively takes certain medications. Reports she is eating ok what she wants. Administrator reports family does not want her to return there and feels she needs more care. Reports they feel she needs admitted to Psychiatric Unit. Reports patient has been scaring other residents threatening them and threatens to shoot them. Repots she writes letters to staff scaring them saying the staff are out to get them. Reports she verbally is agressive with the staff. Repots she flushes her medications down the toilet. Repots she was diagnosed in past with Bipolar with manic depression. Reports she was on Prozac however [facility] discontinued it and they only Psychiatric medication she is on is Clonazepam and Xanax. Reports she has refused to see a Psychiatrist. Reports they feel she is a danger to self and others and lights cigarettes with her oxygen on. Reports she can not at this time take her back however if she gets treatment and is at her baseline will allow patient to return to facility.      Willy EddyKristy Honeycutt, RN  06/06/16 1810

## 2016-06-06 NOTE — ED Notes (Signed)
Presenting Problem: Paranoid thoughts, labile mood    Appearance/Hygiene:  hospital attire   Motor Behavior: wnl   Attitude: cooperative  Affect: normal affect   Speech: normal pitch  Mood: within normal limits , labile per family  Thought Processes: WNL  Perceptions: absent  Thought content: paranoid delusions  Suicidal ideation:  no specific plan to harm self   Homicidal ideation:  none  Orientation: A&Ox4   Memory: intact  Concentration: Good    Insight/ judgement: paranoid ideations      Psychosocial and contextual factors: being moved out of home, environment    C-SSRS Summary (including current and past suicidal ideation, plan, intent, and attempts) : denies any past or current suicidal thoughts     Psychiatric History:  diagnosed with Bipolar at [facility]    Patient reported diagnosis depression     Outpatient services/ Provider: no current    Previous Inpatient Admissions( including location and dates if known): [facility] and 2 places in [place] (per family unknown dates and places)    Self-injurious/ Self-harm behavior: denies    History of violence: none reported    Current Substance use: denies    Trauma identified: none reported    Access to Firearms: none    ASSESSMENT FOR IMMINENT FUTURE DANGER:   [x]   LOW based on the factors considered below.   []   MODERATE based on the factors considered below.   []   HIGH based on the factors considered below.     RISK FACTORS:    [x]   Age <25 or >55   []   Female gender   []   Depressed mood   []   Active suicidal ideation   []   Suicide plan   []   Suicide attempt   []   Access to lethal means   []   Prior suicide attempt   []   Active substance abuse   []   Highly impulsive behaviors   []   Not attending to self-care/ADLs    []   Recent significant loss   []   Chronic pain or medical illness   []   Social isolation   []   History of violence   []   Active psychosis   []   Cognitive impairment    [x]   No outpatient services in place   [x]   Medication noncompliance      []     PROTECTIVE FACTORS:  []  Age >25 and <55  [x]  Female gender   []  Denies depression  [x]  Denies suicidal ideation  [x]  Does not have lethal plan   [x]  Does not have access to guns or weapons  [x]  Patient is verbally contracting for safety  [x]  No prior suicide attempts  [x]  No active substance abuse  [x]  Patient has social or family support  []  No active psychosis or cognitive dysfunction  []  Physically healthy  []  Has outpatient services in place  []  Compliant with recommended medications     []  Patient is future oriented with plans to move back home to [place]  []         Clinical Summary:    Patient presents to ER voluntarily after patient called 911 believing the Nursing Facility was trying to harm herself.  Patient was clinically sober at the time of the evaluation.  Patient was evaluated and offered supportive counseling.  Collateral information was gathered by this nurse per patient's son . Patient pleasant alert and oriented. Patient appears to be having paranoid delusions. Patient talking about how nursing facility she is living in  [facility] the owner is not caring for her and has been hacking into her computer. Patient reports history of depression however denies any current Mental Health Treatment. Patient denies being suicidal and denies any past attempts of harming self. Patient reports she just wants to go home to [place]. Reports her sons had her placed here after multiple admissions to Psychiatrist facilities. Patient admits to not taking some of her medications at nursing home and believing them to be mood stabilizers in which she does not want to take. Patient reports she is eating ok however in the past has had trouble eating. Patient gave permission to talk with her 2 sons and numbers obtained.                Willy EddyKristy Honeycutt, RN  06/06/16 [phone]

## 2016-06-06 NOTE — ED Notes (Signed)
Writer spoke with [name] at [facility]. ETA: 20 min.     [name], [place]  06/06/16 [phone]

## 2016-06-06 NOTE — ED Notes (Signed)
[facility] given to [facility] at this time, [facility]     Cordelia Poche, RN  06/06/16 2029

## 2016-06-06 NOTE — ED Notes (Signed)
Report given to Maralyn SagoSarah, RN     Mena GoesEmily Neddie Steedman, RN  06/06/16 1921

## 2016-06-06 NOTE — ED Notes (Signed)
Patient alert oriented, cooperative. Patient does not appears to be delusional. Patient reports he is sick. Patient shacking, diaphoretic, BP = 166/97, pulse 126. Patient reports moderate headache. Will do CIWA.     Willy Eddy, RN  06/06/16 [phone]

## 2016-06-06 NOTE — ED Notes (Signed)
Spoke to patient son Feliz Beam per patient permission . Feliz Beam reports patient has been having metal health issues for years. Reports she got to point she was not caring for herself and not eating and had to be placed in nursing facility. Reports him and his brother moved her here to [place] where his brother's ex boyfriend owns a nursing facility. Reports she has been here in [place] for a few weeks. Reports she has been calling and cursing at them and accusing the facility of trying to kill her. Reports she has called the police twice in the past couple weeks. Reports she has not been taking her medications. Reports she is scaring other residents there at the facility. Reports in the past she stopped eating and got down to 70 lbs. Repots she is able to leave if she wants however she has no way of getting back to her home in [place] and they are not taking her. Son reports he feels she needs admitted in a Psychiatric Facility.      Willy Eddy, RN  06/06/16 [phone]

## 2016-06-06 NOTE — ED Notes (Signed)
Met with patient for assessment.     Rondall Allegra, RN  06/06/16 1751

## 2016-06-06 NOTE — ED Notes (Signed)
Pt accepted to [facility] per Dr. Orlin Hilding. Pt to go to room 102-A. Nursing report to be called to [phone].     [address] [place], [place]  06/06/16 1955

## 2016-06-06 NOTE — ED Notes (Signed)
[facility] at this time     Cordelia PocheSarah E Pressley Tadesse, RN  06/06/16 2020

## 2016-06-06 NOTE — ED Notes (Signed)
Writer spoke with Judeth Corn[facility]Stephanie at [facility] for [facility]; clinical faxed for review.     [address]Caitlyn Morgan N [place], [place]  06/06/16 [phone]

## 2016-06-06 NOTE — ED Notes (Signed)
Dietary called for a safety tray of burger and fries for pt.      Mena Goes, RN  06/06/16 1345

## 2016-06-06 NOTE — ED Notes (Addendum)
Call placed to [facility] no beds available.   Willy EddyKristy Honeycutt, RN  06/06/16 1839       Willy EddyKristy Honeycutt, RN  06/06/16 [phone]

## 2016-06-06 NOTE — ED Notes (Signed)
Pt's son Rees Obenour [phone]) called to state pt needs geriatric psych placement to help her out. Son stated pt has been to multiple ER's and is just discharged back home. Son stated if anyone has any questions or need to talk to him he is available any time at above number.      Mena Goes, RN  06/06/16 1530

## 2016-06-06 NOTE — ED Notes (Signed)
Pt leaved ed at this time with [facility] pt was refusing to go and per Dr pt placed in 4 point soft for transport for safety, pt states she would jump out of the back of the sqaud if she could     Cordelia Poche, RN  06/06/16 2056

## 2016-06-07 LAB — EKG 12-LEAD
Atrial Rate: 85 {beats}/min
Diagnosis: NORMAL
P Axis: 88 degrees
P-R Interval: 142 ms
Q-T Interval: 386 ms
QRS Duration: 90 ms
QTc Calculation (Bazett): 459 ms
R Axis: 78 degrees
T Axis: 83 degrees
Ventricular Rate: 85 {beats}/min

## 2017-08-01 ENCOUNTER — Encounter: Payer: Self-pay | Admitting: Emergency Medicine

## 2017-08-01 ENCOUNTER — Emergency Department: Payer: Medicare Other

## 2017-08-01 ENCOUNTER — Other Ambulatory Visit: Payer: Self-pay

## 2017-08-01 ENCOUNTER — Inpatient Hospital Stay
Admit: 2017-08-01 | Discharge: 2017-08-01 | Disposition: A | Discharge status: Home / Self Care | Payer: Medicare Other | Attending: Internal Medicine | Admitting: Internal Medicine

## 2017-08-01 ENCOUNTER — Inpatient Hospital Stay
Admission: EM | Admit: 2017-08-01 | Discharge: 2017-08-10 | DRG: 190 | Disposition: A | Discharge status: Other Acute Inpatient Hospital | Payer: Medicare Other | Attending: Internal Medicine | Admitting: Internal Medicine

## 2017-08-01 DIAGNOSIS — K219 Gastro-esophageal reflux disease without esophagitis: Secondary | ICD-10-CM | POA: Diagnosis present

## 2017-08-01 DIAGNOSIS — Z8249 Family history of ischemic heart disease and other diseases of the circulatory system: Secondary | ICD-10-CM | POA: Diagnosis not present

## 2017-08-01 DIAGNOSIS — J9621 Acute and chronic respiratory failure with hypoxia: Secondary | ICD-10-CM | POA: Diagnosis present

## 2017-08-01 DIAGNOSIS — E039 Hypothyroidism, unspecified: Secondary | ICD-10-CM | POA: Diagnosis present

## 2017-08-01 DIAGNOSIS — Z79899 Other long term (current) drug therapy: Secondary | ICD-10-CM

## 2017-08-01 DIAGNOSIS — Z7951 Long term (current) use of inhaled steroids: Secondary | ICD-10-CM | POA: Diagnosis not present

## 2017-08-01 DIAGNOSIS — F0390 Unspecified dementia without behavioral disturbance: Secondary | ICD-10-CM | POA: Diagnosis present

## 2017-08-01 DIAGNOSIS — Z66 Do not resuscitate: Secondary | ICD-10-CM | POA: Diagnosis present

## 2017-08-01 DIAGNOSIS — F333 Major depressive disorder, recurrent, severe with psychotic symptoms: Secondary | ICD-10-CM

## 2017-08-01 DIAGNOSIS — Z888 Allergy status to other drugs, medicaments and biological substances status: Secondary | ICD-10-CM

## 2017-08-01 DIAGNOSIS — Z7982 Long term (current) use of aspirin: Secondary | ICD-10-CM | POA: Diagnosis not present

## 2017-08-01 DIAGNOSIS — J441 Chronic obstructive pulmonary disease with (acute) exacerbation: Secondary | ICD-10-CM | POA: Diagnosis present

## 2017-08-01 DIAGNOSIS — Z79890 Hormone replacement therapy (postmenopausal): Secondary | ICD-10-CM

## 2017-08-01 DIAGNOSIS — I10 Essential (primary) hypertension: Secondary | ICD-10-CM | POA: Diagnosis present

## 2017-08-01 DIAGNOSIS — Z87891 Personal history of nicotine dependence: Secondary | ICD-10-CM

## 2017-08-01 DIAGNOSIS — J449 Chronic obstructive pulmonary disease, unspecified: Secondary | ICD-10-CM | POA: Diagnosis present

## 2017-08-01 DIAGNOSIS — Z681 Body mass index (BMI) 19 or less, adult: Secondary | ICD-10-CM

## 2017-08-01 DIAGNOSIS — E43 Unspecified severe protein-calorie malnutrition: Secondary | ICD-10-CM | POA: Diagnosis present

## 2017-08-01 DIAGNOSIS — Z9981 Dependence on supplemental oxygen: Secondary | ICD-10-CM

## 2017-08-01 DIAGNOSIS — R627 Adult failure to thrive: Secondary | ICD-10-CM | POA: Diagnosis present

## 2017-08-01 LAB — COMPREHENSIVE METABOLIC PANEL
ALT: 27 U/L (ref 14–54)
AST: 38 U/L (ref 15–41)
Albumin: 4.5 g/dL (ref 3.5–5.0)
Alkaline Phosphatase: 86 U/L (ref 38–126)
Anion gap: 10 (ref 5–15)
BILIRUBIN TOTAL: 0.7 mg/dL (ref 0.3–1.2)
BUN: 26 mg/dL — ABNORMAL HIGH (ref 6–20)
CALCIUM: 9.6 mg/dL (ref 8.9–10.3)
CO2: 29 mmol/L (ref 22–32)
CREATININE: 0.63 mg/dL (ref 0.44–1.00)
Chloride: 102 mmol/L (ref 101–111)
GFR calc Af Amer: >60 mL/min (ref >60)
Glucose, Bld: 110 mg/dL — ABNORMAL HIGH (ref 65–99)
Potassium: 3.9 mmol/L (ref 3.5–5.1)
Sodium: 141 mmol/L (ref 135–145)
TOTAL PROTEIN: 7.4 g/dL (ref 6.5–8.1)

## 2017-08-01 LAB — BLOOD GAS, VENOUS
Acid-Base Excess: 4.9 mmol/L — ABNORMAL HIGH (ref 0.0–2.0)
Bicarbonate: 31.4 mmol/L — ABNORMAL HIGH (ref 20.0–28.0)
O2 Saturation: 90.5 %
PH VEN: 7.38 (ref 7.250–7.430)
Patient temperature: 37
pCO2, Ven: 53 mmHg (ref 44.0–60.0)
pO2, Ven: 61 mmHg — ABNORMAL HIGH (ref 32.0–45.0)

## 2017-08-01 LAB — CBC WITH DIFFERENTIAL/PLATELET
Basophils Absolute: 0.1 10*3/uL (ref 0–0.1)
Basophils Relative: 1 %
Eosinophils Absolute: 0.3 10*3/uL (ref 0–0.7)
Eosinophils Relative: 3 %
HEMATOCRIT: 39.5 % (ref 35.0–47.0)
Hemoglobin: 13.2 g/dL (ref 12.0–16.0)
Lymphocytes Relative: 15 %
Lymphs Abs: 1.4 10*3/uL (ref 1.0–3.6)
MCH: 31 pg (ref 26.0–34.0)
MCHC: 33.5 g/dL (ref 32.0–36.0)
MCV: 92.6 fL (ref 80.0–100.0)
MONOS PCT: 10 %
Monocytes Absolute: 0.9 10*3/uL (ref 0.2–0.9)
Neutro Abs: 6.5 10*3/uL (ref 1.4–6.5)
Neutrophils Relative %: 71 %
Platelets: 281 10*3/uL (ref 150–440)
RBC: 4.27 MIL/uL (ref 3.80–5.20)
RDW: 14.8 % — AB (ref 11.5–14.5)
WBC: 9.1 10*3/uL (ref 3.6–11.0)

## 2017-08-01 LAB — VITAMIN B12: VITAMIN B 12: 681 pg/mL (ref 180–914)

## 2017-08-01 LAB — TSH: TSH: 2.094 u[IU]/mL (ref 0.350–4.500)

## 2017-08-01 LAB — TROPONIN I: Troponin I: <0.03 ng/mL (ref <0.03)

## 2017-08-01 LAB — MAGNESIUM: Magnesium: 2 mg/dL (ref 1.7–2.4)

## 2017-08-01 MED ORDER — POLYETHYLENE GLYCOL 3350 17 G PO PACK
17.0000 g | PACK | Freq: Every day | ORAL | Status: DC | PRN
  Administered 2017-08-06: 17 g via ORAL

## 2017-08-01 MED ORDER — ACETAMINOPHEN 650 MG RE SUPP: 650.0000 mg | Freq: Four times a day (QID) | RECTAL | Status: DC | PRN

## 2017-08-01 MED ORDER — DOXYCYCLINE HYCLATE 100 MG PO TABS
100.0000 mg | ORAL_TABLET | Freq: Two times a day (BID) | ORAL | Status: DC
  Administered 2017-08-01 – 2017-08-04 (×6): 100 mg via ORAL
  Filled 2017-08-01 (×7): qty 1

## 2017-08-01 MED ORDER — BUPROPION HCL ER (SR) 150 MG PO TB12
150.0000 mg | ORAL_TABLET | Freq: Two times a day (BID) | ORAL | Status: DC
  Administered 2017-08-01: 150 mg via ORAL
  Filled 2017-08-01 (×3): qty 1

## 2017-08-01 MED ORDER — MIRTAZAPINE 15 MG PO TABS
45.0000 mg | ORAL_TABLET | Freq: Every day | ORAL | Status: DC
  Administered 2017-08-01 – 2017-08-09 (×9): 45 mg via ORAL
  Filled 2017-08-01 (×9): qty 3

## 2017-08-01 MED ORDER — TIOTROPIUM BROMIDE MONOHYDRATE 18 MCG IN CAPS
18.0000 ug | ORAL_CAPSULE | Freq: Every day | RESPIRATORY_TRACT | Status: DC
  Administered 2017-08-01 – 2017-08-10 (×10): 18 ug via RESPIRATORY_TRACT
  Filled 2017-08-01 (×3): qty 5

## 2017-08-01 MED ORDER — OXYCODONE HCL 5 MG PO TABS
5.0000 mg | ORAL_TABLET | Freq: Two times a day (BID) | ORAL | Status: DC | PRN
  Administered 2017-08-04: 5 mg via ORAL
  Filled 2017-08-01: qty 1

## 2017-08-01 MED ORDER — DIAZEPAM 2 MG PO TABS
2.0000 mg | ORAL_TABLET | Freq: Once | ORAL | Status: AC
  Administered 2017-08-01: 2 mg via ORAL

## 2017-08-01 MED ORDER — METHYLPREDNISOLONE SODIUM SUCC 40 MG IJ SOLR
40.0000 mg | Freq: Three times a day (TID) | INTRAMUSCULAR | Status: DC
Start: 2017-08-01 — End: 2017-08-02

## 2017-08-01 MED ORDER — METHYLPREDNISOLONE SODIUM SUCC 125 MG IJ SOLR
125.0000 mg | Freq: Once | INTRAMUSCULAR | Status: AC
  Administered 2017-08-01: 125 mg via INTRAVENOUS
  Filled 2017-08-01: qty 2

## 2017-08-01 MED ORDER — OLANZAPINE 5 MG PO TABS
5.0000 mg | ORAL_TABLET | Freq: Every day | ORAL | Status: DC
  Administered 2017-08-01 – 2017-08-06 (×6): 5 mg via ORAL
  Filled 2017-08-01 (×7): qty 1

## 2017-08-01 MED ORDER — TRAMADOL HCL 50 MG PO TABS
50.0000 mg | ORAL_TABLET | Freq: Four times a day (QID) | ORAL | Status: DC | PRN
  Administered 2017-08-07 – 2017-08-10 (×2): 50 mg via ORAL
  Filled 2017-08-01 (×2): qty 1

## 2017-08-01 MED ORDER — ONDANSETRON HCL 4 MG PO TABS: 4.0000 mg | ORAL_TABLET | Freq: Four times a day (QID) | ORAL | Status: DC | PRN

## 2017-08-01 MED ORDER — DIAZEPAM 2 MG PO TABS
ORAL_TABLET | ORAL | Status: AC
  Administered 2017-08-01: 2 mg via ORAL
  Filled 2017-08-01: qty 1

## 2017-08-01 MED ORDER — ENSURE ENLIVE PO LIQD
237.0000 mL | Freq: Three times a day (TID) | ORAL | Status: DC
  Administered 2017-08-01 – 2017-08-02 (×2): 237 mL via ORAL

## 2017-08-01 MED ORDER — DOCUSATE SODIUM 100 MG PO CAPS
200.0000 mg | ORAL_CAPSULE | Freq: Two times a day (BID) | ORAL | Status: DC
  Administered 2017-08-01 – 2017-08-10 (×19): 200 mg via ORAL
  Filled 2017-08-01 (×19): qty 2

## 2017-08-01 MED ORDER — CHOLECALCIFEROL 10 MCG (400 UNIT) PO TABS
400.0000 [IU] | ORAL_TABLET | Freq: Every day | ORAL | Status: DC
  Administered 2017-08-02 – 2017-08-10 (×9): 400 [IU] via ORAL
  Filled 2017-08-01 (×9): qty 1

## 2017-08-01 MED ORDER — ONDANSETRON HCL 4 MG/2ML IJ SOLN: 4.0000 mg | Freq: Four times a day (QID) | INTRAMUSCULAR | Status: DC | PRN

## 2017-08-01 MED ORDER — SODIUM CHLORIDE 0.9 % IV SOLN
500.0000 mg | Freq: Once | INTRAVENOUS | Status: AC
  Administered 2017-08-01: 500 mg via INTRAVENOUS
  Filled 2017-08-01: qty 500

## 2017-08-01 MED ORDER — NITROGLYCERIN 0.4 MG SL SUBL: 0.4000 mg | SUBLINGUAL_TABLET | SUBLINGUAL | Status: DC | PRN

## 2017-08-01 MED ORDER — ASPIRIN 81 MG PO CHEW
81.0000 mg | CHEWABLE_TABLET | ORAL | Status: DC
  Administered 2017-08-03 – 2017-08-05 (×2): 81 mg via ORAL
  Filled 2017-08-01 (×2): qty 1

## 2017-08-01 MED ORDER — SENNA 8.6 MG PO TABS
2.0000 | ORAL_TABLET | Freq: Every day | ORAL | Status: DC
  Administered 2017-08-01 – 2017-08-09 (×9): 17.2 mg via ORAL
  Filled 2017-08-01 (×9): qty 2

## 2017-08-01 MED ORDER — ENOXAPARIN SODIUM 40 MG/0.4ML ~~LOC~~ SOLN
40.0000 mg | SUBCUTANEOUS | Status: DC
  Administered 2017-08-01: 40 mg via SUBCUTANEOUS
  Filled 2017-08-01: qty 0.4

## 2017-08-01 MED ORDER — FLUVOXAMINE MALEATE 50 MG PO TABS
50.0000 mg | ORAL_TABLET | Freq: Two times a day (BID) | ORAL | Status: DC
  Administered 2017-08-01 – 2017-08-02 (×2): 50 mg via ORAL
  Filled 2017-08-01 (×3): qty 1

## 2017-08-01 MED ORDER — LEVOTHYROXINE SODIUM 50 MCG PO TABS
50.0000 ug | ORAL_TABLET | ORAL | Status: DC
  Administered 2017-08-01 – 2017-08-10 (×10): 50 ug via ORAL
  Filled 2017-08-01 (×11): qty 1

## 2017-08-01 MED ORDER — ADULT MULTIVITAMIN W/MINERALS CH
1.0000 | ORAL_TABLET | Freq: Every day | ORAL | Status: DC
  Administered 2017-08-01 – 2017-08-09 (×8): 1 via ORAL
  Filled 2017-08-01 (×8): qty 1

## 2017-08-01 MED ORDER — MEGESTROL ACETATE 400 MG/10ML PO SUSP
400.0000 mg | Freq: Every day | ORAL | Status: DC
  Administered 2017-08-02 – 2017-08-05 (×3): 400 mg via ORAL
  Filled 2017-08-01 (×5): qty 10

## 2017-08-01 MED ORDER — ATORVASTATIN CALCIUM 20 MG PO TABS
20.0000 mg | ORAL_TABLET | Freq: Every day | ORAL | Status: DC
  Administered 2017-08-01 – 2017-08-09 (×8): 20 mg via ORAL
  Filled 2017-08-01 (×8): qty 1

## 2017-08-01 MED ORDER — ACETAMINOPHEN 325 MG PO TABS
650.0000 mg | ORAL_TABLET | Freq: Four times a day (QID) | ORAL | Status: DC | PRN
  Administered 2017-08-02 – 2017-08-03 (×2): 650 mg via ORAL
  Filled 2017-08-01 (×2): qty 2

## 2017-08-01 MED ORDER — PANTOPRAZOLE SODIUM 40 MG PO TBEC
40.0000 mg | DELAYED_RELEASE_TABLET | Freq: Every day | ORAL | Status: DC
  Administered 2017-08-01 – 2017-08-10 (×10): 40 mg via ORAL
  Filled 2017-08-01 (×10): qty 1

## 2017-08-01 MED ORDER — CLOZAPINE 25 MG PO TABS
75.0000 mg | ORAL_TABLET | Freq: Every day | ORAL | Status: DC
  Administered 2017-08-01: 75 mg via ORAL
  Filled 2017-08-01: qty 3

## 2017-08-01 MED ORDER — ENOXAPARIN SODIUM 40 MG/0.4ML ~~LOC~~ SOLN: 40.0000 mg | SUBCUTANEOUS | Status: DC

## 2017-08-01 MED ORDER — CALCIUM CARBONATE-VITAMIN D 500-200 MG-UNIT PO TABS
1.0000 | ORAL_TABLET | Freq: Every day | ORAL | Status: DC
  Administered 2017-08-01 – 2017-08-10 (×10): 1 via ORAL
  Filled 2017-08-01 (×10): qty 1

## 2017-08-01 MED ORDER — IPRATROPIUM-ALBUTEROL 0.5-2.5 (3) MG/3ML IN SOLN
3.0000 mL | RESPIRATORY_TRACT | Status: DC
  Administered 2017-08-01 – 2017-08-02 (×6): 3 mL via RESPIRATORY_TRACT
  Filled 2017-08-01 (×7): qty 3

## 2017-08-01 MED ORDER — IPRATROPIUM-ALBUTEROL 0.5-2.5 (3) MG/3ML IN SOLN
9.0000 mL | Freq: Once | RESPIRATORY_TRACT | Status: AC
  Administered 2017-08-01: 9 mL via RESPIRATORY_TRACT
  Filled 2017-08-01: qty 9

## 2017-08-01 MED ORDER — METOPROLOL TARTRATE 25 MG PO TABS
12.5000 mg | ORAL_TABLET | Freq: Two times a day (BID) | ORAL | Status: DC
  Administered 2017-08-01 – 2017-08-07 (×10): 12.5 mg via ORAL
  Filled 2017-08-01 (×13): qty 1

## 2017-08-01 NOTE — ED Provider Notes (Signed)
[facility] Emergency Department Provider Note  ___________________________________________   First MD Initiated Contact with Patient 08/01/17 1100     (approximate)  I have reviewed the triage vital signs and the nursing notes.   HISTORY  Chief Complaint FFT-refusing food/meds/doc appts   HPI Melissa George is a 67 y.o. female with a history of COPD as well as anxiety who is on 2 L of home oxygen who is presenting with worsening shortness of breath over the past month.  She is denying any pain.  Denies cough or fever.  Says that she is also stopped taking her home medications because she is "tired of taking them."  She does not report any suicidal ideation.   Past Medical History:  Diagnosis Date  . Anxiety   . Anxiety   . COPD (chronic obstructive pulmonary disease) ([place])   . 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 Coronary artery disease 11/06/2014  . NSTEMI (non-ST elevated myocardial infarction) ([place]) 10/29/2014  . Protein-calorie malnutrition, severe ([place]) 10/29/2014  . Major depressive disorder, recurrent episode, severe with catatonia ([place]) 10/24/2014  . Delusional disorder, persecutory type ([place]) 09/24/2014  . COPD (chronic obstructive pulmonary disease) ([place]) 09/12/2014  . GAD (generalized anxiety disorder)     Past Surgical History:  Procedure Laterality Date  . ABDOMINAL HYSTERECTOMY    . APPENDECTOMY    . CARDIAC CATHETERIZATION N/A 10/31/2014   Procedure: Left Heart Cath and Coronary Angiography;  Surgeon: Isaias Cowman, MD;  Location: [place];  Service: Cardiovascular;  Laterality: N/A;  . CESAREAN SECTION    . HAND SURGERY      Prior to Admission medications   Medication Sig Start Date End Date Taking? Authorizing Provider  ALPRAZolam (XANAX) 0.25 MG tablet Take 1 tablet (0.25 mg total) by mouth 3 (three) times daily. Patient taking differently: Take 0.25 mg  by mouth 3 (three) times daily as needed.  11/11/14  Yes Hildred Priest, MD  aspirin 81 MG chewable tablet Chew 1 tablet (81 mg total) by mouth before cath procedure. 11/05/14  Yes Pucilowska, Jolanta B, MD  atorvastatin (LIPITOR) 20 MG tablet Take 1 tablet (20 mg total) by mouth daily at 6 PM. 11/11/14  Yes Hildred Priest, MD  buPROPion [facility] SR) 150 MG 12 hr tablet Take 150 mg by mouth 2 (two) times daily.   Yes [provider]  Calcium Carbonate-Vitamin D3 (CALCIUM 600-D) 600-400 MG-UNIT TABS Take 1 tablet by mouth daily.   Yes [provider]  enoxaparin (LOVENOX) 40 MG/0.4ML injection Inject 40 mg into the skin daily.   Yes [provider]  levothyroxine (SYNTHROID, LEVOTHROID) 50 MCG tablet Take 50 mcg by mouth every morning.   Yes [provider]  Multiple Vitamin (MULTIVITAMIN WITH MINERALS) TABS tablet Take 1 tablet by mouth daily. 11/11/14  Yes Hildred Priest, MD  oxyCODONE (OXY IR/ROXICODONE) 5 MG immediate release tablet Take 5 mg by mouth every 12 (twelve) hours as needed for severe pain.   Yes [provider]  albuterol (PROVENTIL HFA;VENTOLIN HFA) 108 (90 BASE) MCG/ACT inhaler Inhale 2 puffs into the lungs every 6 (six) hours as needed for wheezing or shortness of breath. Patient not taking: Reported on 08/01/2017 11/11/14   Hildred Priest, MD  cholecalciferol (D-VI-SOL) 400 UNIT/ML LIQD Take 1 mL (400 Units total) by mouth daily with breakfast. Patient not taking: Reported on 08/01/2017 11/11/14   Hildred Priest, MD  cloZAPine (CLOZARIL) 25 MG tablet Take 3 tablets (75 mg total) by mouth at bedtime. Patient not taking: Reported on 08/01/2017 11/11/14   Hildred Priest, MD  docusate sodium (COLACE) 100 MG capsule Take 2 capsules (200 mg total) by mouth 2 (two) times daily. Patient not taking: Reported on 08/01/2017 11/05/14   Pucilowska, Wardell Honour, MD  feeding supplement, ENSURE  ENLIVE, (ENSURE ENLIVE) LIQD Take 237 mLs by mouth 3 (three) times daily with meals. Patient not taking: Reported on 08/01/2017 11/11/14   Hildred Priest, MD  fluvoxaMINE (LUVOX) 50 MG tablet Take 1 tablet (50 mg total) by mouth 2 (two) times daily. Patient not taking: Reported on 08/01/2017 11/11/14   Hildred Priest, MD  megestrol (MEGACE) 400 MG/10ML suspension Take 10 mLs (400 mg total) by mouth at bedtime. Patient not taking: Reported on 08/01/2017 11/11/14   Hildred Priest, MD  metoprolol tartrate (LOPRESSOR) 25 MG tablet Take 0.5 tablets (12.5 mg total) by mouth 2 (two) times daily. Patient not taking: Reported on 08/01/2017 11/11/14   Hildred Priest, MD  mirtazapine (REMERON) 45 MG tablet Take 1 tablet (45 mg total) by mouth at bedtime. Patient not taking: Reported on 08/01/2017 11/11/14   Hildred Priest, MD  nitroGLYCERIN (NITROSTAT) 0.4 MG SL tablet Place 1 tablet (0.4 mg total) under the tongue every 5 (five) minutes as needed for chest pain. Patient not taking: Reported on 08/01/2017 11/11/14   Hildred Priest, MD  pantoprazole (PROTONIX) 40 MG tablet Take 1 tablet (40 mg total) by mouth daily. Patient not taking: Reported on 08/01/2017 11/11/14   Hildred Priest, MD  senna (SENOKOT) 8.6 MG TABS tablet Take 2 tablets (17.2 mg total) by mouth at bedtime. Patient not taking: Reported on 08/01/2017 11/11/14   Hildred Priest, MD  tiotropium (SPIRIVA) 18 MCG inhalation capsule Place 1 capsule (18 mcg total) into inhaler and inhale daily. Patient not taking: Reported on 08/01/2017 11/11/14   Hildred Priest, MD    Allergies Ciprofloxacin; Levofloxacin; Morphine and related; Sulfa antibiotics; Symbicort [budesonide-formoterol fumarate]; and Advair diskus [fluticasone-salmeterol]  Family History  Problem Relation Age of Onset  . CAD Mother   . Thyroid cancer Other   . Lung cancer Other     Social  History Social History   Tobacco Use  . Smoking status: Former Smoker    Packs/day: 0.25    Years: 15.00    Pack years: 3.75    Types: Cigarettes    Last attempt to quit: 09/13/2014    Years since quitting: 2.8  . Smokeless tobacco: Never Used  Substance Use Topics  . Alcohol use: No  . Drug use: No    Review of Systems  Constitutional: No fever/chills Eyes: No visual changes. ENT: No sore throat. Cardiovascular: Denies chest pain. Respiratory: As above Gastrointestinal: No abdominal pain.  No nausea, no vomiting.  No diarrhea.  No constipation. Genitourinary: Negative for dysuria. Musculoskeletal: Negative for back pain. Skin: Negative for rash. Neurological: Negative for headaches, focal weakness or numbness.   ____________________________________________   PHYSICAL EXAM:  VITAL SIGNS: ED Triage Vitals  Enc Vitals Group     BP 08/01/17 1040 (!) 144/99     Pulse Rate 08/01/17 1040 (!) 109     Resp 08/01/17 1040 (!) 36     Temp 08/01/17 1040 98.4 F (36.9 C)     Temp Source 08/01/17 1040 Oral     SpO2 08/01/17 1040 95 %     Weight 08/01/17 1026 90 lb (40.8 kg)     Height  08/01/17 1026 5\' 2"  (1.575 m)     Head Circumference --      Peak Flow --      Pain Score 08/01/17 1026 0     Pain Loc --      Pain Edu? --      Excl. in [place]? --     Constitutional: Alert and oriented.  Eyes: Conjunctivae are normal.  Head: Atraumatic. Nose: No congestion/rhinnorhea. Mouth/Throat: Mucous membranes are moist.  Neck: No stridor.   Cardiovascular: Tachycardic, regular rhythm. Grossly normal heart sounds.   Respiratory: Tachypneic with labored respirations.  Using accessory muscles with supraclavicular retractions.  No wheezing but also near no air movement on expiration. Gastrointestinal: Soft and nontender. No distention. No CVA tenderness. Musculoskeletal: No lower extremity tenderness nor edema.  No joint effusions. Neurologic:  Normal speech and language. No gross focal  neurologic deficits are appreciated. Skin:  Skin is warm, dry and intact. No rash noted. Psychiatric: Mood and affect are normal. Speech and behavior are normal.  ____________________________________________   LABS (all labs ordered are listed, but only abnormal results are displayed)  Labs Reviewed  CBC WITH DIFFERENTIAL/PLATELET - Abnormal; Notable for the following components:      Result Value   RDW 14.8 (*)    All other components within normal limits  COMPREHENSIVE METABOLIC PANEL - Abnormal; Notable for the following components:   Glucose, Bld 110 (*)    BUN 26 (*)    All other components within normal limits  BLOOD GAS, VENOUS - Abnormal; Notable for the following components:   pO2, Ven 61.0 (*)    Bicarbonate 31.4 (*)    Acid-Base Excess 4.9 (*)    All other components within normal limits  TROPONIN I   ____________________________________________  EKG  ED ECG REPORT I, Doran Stabler, the attending physician, personally viewed and interpreted this ECG.   Date: 08/01/2017  EKG Time: 1029  Rate: 115  Rhythm: sinus tachycardia  Axis: Normal  Intervals:nonspecific intraventricular conduction delay  ST&T Change: No ST segment elevation or depression.  No abnormal T wave inversion.  EKG read confounded by patient's wandering baseline likely related to her respirations.  However, no obvious STEMI. ____________________________________________  RADIOLOGY  COPD.  Stable nodule ____________________________________________   PROCEDURES  Procedure(s) performed:   Procedures  Critical Care performed:   ____________________________________________   INITIAL IMPRESSION / ASSESSMENT AND PLAN / ED COURSE  Pertinent labs & imaging results that were available during my care of the patient were reviewed by me and considered in my medical decision making (see chart for details).  Differential includes, but is not limited to, viral syndrome, bronchitis including  COPD exacerbation, pneumonia, reactive airway disease including asthma, CHF including exacerbation with or without pulmonary/interstitial edema, pneumothorax, ACS, thoracic trauma, and pulmonary embolism. 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  Patient with very poor respiratory status at this time.  I feel that steroids are potentially life-saving.  We will give Valium along with the steroids as an adjunct to help prevent exacerbation of her anxiety and psychosis.  ----------------------------------------- 12:09 PM on 08/01/2017 -----------------------------------------  I discussed the case with the patient's son, [name], who is very concerned about the patient being able to take care of herself at home.  He states that the patient has a long history of psychiatric disease and that she was  in [place] up until recently in a group home.  He says the because she made multiple calls to state agencies that she was not allowed to stay any longer the group home and then stayed with her son in [place].  The patient then had a hip fracture in the last several weeks and was treated operatively in the hospital in [place].  She has not filled her prescriptions and has stopped taking her Lovenox as of this past Sunday.  He is requesting help with home health or placement.  I discussed the case with care management he says that they will call [name] to discuss further.  I discussed the case with [facility] care management.  ----------------------------------------- 12:35 PM on 08/01/2017 -----------------------------------------  Despite 3 DuoNeb's as well as steroids the patient is still with labored respirations using accessory muscles.  She will be admitted to the hospital.  She is moving slightly more air now I can hear faint, coarse wheezing.  However, I believe that she  will need further time and medication.  Signed out to Dr. Estanislado Pandy. ____________________________________________   FINAL CLINICAL IMPRESSION(S) / ED DIAGNOSES  COPD exacerbation    NEW MEDICATIONS STARTED DURING THIS VISIT:  New Prescriptions   No medications on file     Note:  This document was prepared using Dragon voice recognition software and may include unintentional dictation errors.     Orbie Pyo, MD 08/01/17 1236

## 2017-08-01 NOTE — H&P (Signed)
[place] [facility] [place] NAME: Melissa George    MR#:  [id number]  DATE OF BIRTH:  1951/02/20  DATE OF ADMISSION:  08/01/2017  PRIMARY CARE PHYSICIAN:  [facility]  REQUESTING/REFERRING PHYSICIAN: Dr Dineen Kid  CHIEF COMPLAINT:   SOB HISTORY OF PRESENT ILLNESS:  Melissa George  is a 67 y.o. female with a known history of chronic hypoxic earlier due to COPD on 2 L of oxygen who presents today to the emergency room complaining of shortness of breath.  Patient son called EMS because patient has been refusing to take medications and eat.  She will not see her primary care physician as well.  She says that over the past month she has had increasing shortness of breath and dyspnea exertion.  She denies chest pain, lower extremity edema, PND orthopnea.  She has lost weight but is unsure how much weight she is lost. She denies Fever, chills, urinary symptoms.  Chest x-ray shows no evidence of pneumonia.  When she presented to the ER she had increased shortness of breath and labored breathing.  She is being brought into the hospital for acute COPD exacerbation, shortness of breath and adult failure to thrive.  PAST MEDICAL HISTORY:   Past Medical History:  Diagnosis Date  . Anxiety   . Anxiety   . COPD (chronic obstructive pulmonary disease) ([place])   . 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 ABDOMINAL HYSTERECTOMY    . APPENDECTOMY    . CARDIAC CATHETERIZATION N/A 10/31/2014   Procedure: Left Heart Cath and Coronary Angiography;  Surgeon: Isaias Cowman, MD;  Location: [place] [facility];  Service: Cardiovascular;  Laterality: N/A;  . CESAREAN SECTION    . HAND SURGERY      SOCIAL HISTORY:   Social History   Tobacco Use  . Smoking status: Former Smoker    Packs/day: 0.25    Years: 15.00    Pack years: 3.75    Types: Cigarettes    Last attempt to quit: 09/13/2014     Years since quitting: 2.8  . Smokeless tobacco: Never Used  Substance Use Topics  . Alcohol use: No    FAMILY HISTORY:   Family History  Problem Relation Age of Onset  . CAD Mother   . Thyroid cancer Other   . Lung cancer Other     DRUG ALLERGIES:   Allergies  Allergen Reactions  . Ciprofloxacin Shortness Of Breath and Itching  . Levofloxacin Other (See Comments)    Reaction:  Unknown   . Morphine And Related Nausea And Vomiting  . Sulfa Antibiotics Hives and Itching  . Symbicort [Budesonide-Formoterol Fumarate] Other (See Comments)    Reaction:  Psychotic episode  . Advair Diskus [Fluticasone-Salmeterol] Anxiety    REVIEW OF SYSTEMS:   Review of Systems  Constitutional: Positive for malaise/fatigue and weight loss. Negative for chills and fever.  HENT: Negative.  Negative for ear discharge, ear pain, hearing loss, nosebleeds and sore throat.   Eyes: Negative.  Negative for blurred vision and pain.  Respiratory: Positive for shortness of breath. Negative for cough, hemoptysis and wheezing.   Cardiovascular: Negative.  Negative for chest pain, palpitations and leg swelling.  Gastrointestinal: Negative.  Negative for abdominal pain, blood in stool, diarrhea, nausea and vomiting.  Genitourinary: Negative.  Negative for dysuria.  Musculoskeletal: Negative.  Negative for back pain.  Skin: Negative.   Neurological:  Negative for dizziness, tremors, speech change, focal weakness, seizures and headaches.  Endo/Heme/Allergies: Negative.  Does not bruise/bleed easily.  Psychiatric/Behavioral: Negative.  Negative for depression, hallucinations and suicidal ideas.    MEDICATIONS AT HOME:   Prior to Admission medications   Medication Sig Start Date End Date Taking? Authorizing Provider  ALPRAZolam (XANAX) 0.25 MG tablet Take 1 tablet (0.25 mg total) by mouth 3 (three) times daily. Patient taking differently: Take 0.25 mg by mouth 3 (three) times daily as needed.  11/11/14  Yes  Hildred Priest, MD  aspirin 81 MG chewable tablet Chew 1 tablet (81 mg total) by mouth before cath procedure. 11/05/14  Yes Pucilowska, Jolanta B, MD  atorvastatin (LIPITOR) 20 MG tablet Take 1 tablet (20 mg total) by mouth daily at 6 PM. 11/11/14  Yes Hildred Priest, MD  buPROPion [facility] SR) 150 MG 12 hr tablet Take 150 mg by mouth 2 (two) times daily.   Yes [provider]  Calcium Carbonate-Vitamin D3 (CALCIUM 600-D) 600-400 MG-UNIT TABS Take 1 tablet by mouth daily.   Yes [provider]  enoxaparin (LOVENOX) 40 MG/0.4ML injection Inject 40 mg into the skin daily.   Yes [provider]  levothyroxine (SYNTHROID, LEVOTHROID) 50 MCG tablet Take 50 mcg by mouth every morning.   Yes [provider]  Multiple Vitamin (MULTIVITAMIN WITH MINERALS) TABS tablet Take 1 tablet by mouth daily. 11/11/14  Yes Hildred Priest, MD  oxyCODONE (OXY IR/ROXICODONE) 5 MG immediate release tablet Take 5 mg by mouth every 12 (twelve) hours as needed for severe pain.   Yes [provider]  albuterol (PROVENTIL HFA;VENTOLIN HFA) 108 (90 BASE) MCG/ACT inhaler Inhale 2 puffs into the lungs every 6 (six) hours as needed for wheezing or shortness of breath. Patient not taking: Reported on 08/01/2017 11/11/14   Hildred Priest, MD  cholecalciferol (D-VI-SOL) 400 UNIT/ML LIQD Take 1 mL (400 Units total) by mouth daily with breakfast. Patient not taking: Reported on 08/01/2017 11/11/14   Hildred Priest, MD  cloZAPine (CLOZARIL) 25 MG tablet Take 3 tablets (75 mg total) by mouth at bedtime. Patient not taking: Reported on 08/01/2017 11/11/14   Hildred Priest, MD  docusate sodium (COLACE) 100 MG capsule Take 2 capsules (200 mg total) by mouth 2 (two) times daily. Patient not taking: Reported on 08/01/2017 11/05/14   Pucilowska, Wardell Honour, MD  feeding supplement, ENSURE ENLIVE, (ENSURE ENLIVE) LIQD Take 237 mLs by mouth 3  (three) times daily with meals. Patient not taking: Reported on 08/01/2017 11/11/14   Hildred Priest, MD  fluvoxaMINE (LUVOX) 50 MG tablet Take 1 tablet (50 mg total) by mouth 2 (two) times daily. Patient not taking: Reported on 08/01/2017 11/11/14   Hildred Priest, MD  megestrol (MEGACE) 400 MG/10ML suspension Take 10 mLs (400 mg total) by mouth at bedtime. Patient not taking: Reported on 08/01/2017 11/11/14   Hildred Priest, MD  metoprolol tartrate (LOPRESSOR) 25 MG tablet Take 0.5 tablets (12.5 mg total) by mouth 2 (two) times daily. Patient not taking: Reported on 08/01/2017 11/11/14   Hildred Priest, MD  mirtazapine (REMERON) 45 MG tablet Take 1 tablet (45 mg total) by mouth at bedtime. Patient not taking: Reported on 08/01/2017 11/11/14   Hildred Priest, MD  nitroGLYCERIN (NITROSTAT) 0.4 MG SL tablet Place 1 tablet (0.4 mg total) under the tongue every 5 (five) minutes as needed for chest pain. Patient not taking: Reported on 08/01/2017 11/11/14   Hildred Priest, MD  pantoprazole (PROTONIX) 40 MG tablet Take 1 tablet (40 mg  total) by mouth daily. Patient not taking: Reported on 08/01/2017 11/11/14   Hildred Priest, MD  senna (SENOKOT) 8.6 MG TABS tablet Take 2 tablets (17.2 mg total) by mouth at bedtime. Patient not taking: Reported on 08/01/2017 11/11/14   Hildred Priest, MD  tiotropium (SPIRIVA) 18 MCG inhalation capsule Place 1 capsule (18 mcg total) into inhaler and inhale daily. Patient not taking: Reported on 08/01/2017 11/11/14   Hildred Priest, MD      VITAL SIGNS:  Blood pressure (!) 144/99, pulse (!) 109, temperature 98.4 F (36.9 C), temperature source Oral, resp. rate (!) 36, height 5\' 2"  (1.575 m), weight 40.8 kg (90 lb), SpO2 95 %.  PHYSICAL EXAMINATION:   Physical Exam  Constitutional: She is oriented to person, place, and time. No distress.  Thin and frail cachectic appearing  HENT:   Head: Normocephalic.  Eyes: No scleral icterus.  Neck: Normal range of motion. Neck supple. No JVD present. No tracheal deviation present.  Cardiovascular: Regular rhythm and normal heart sounds. Exam reveals no gallop and no friction rub.  No murmur heard. Tachycardic  Pulmonary/Chest: Effort normal. No respiratory distress. She has no wheezes. She has no rales. She exhibits no tenderness.  Decreased breath sounds throughout lung fields  Abdominal: Soft. Bowel sounds are normal. She exhibits no distension and no mass. There is no tenderness. There is no rebound and no guarding.  Musculoskeletal: Normal range of motion. She exhibits no edema.  Neurological: She is alert and oriented to person, place, and time.  Skin: Skin is warm. No rash noted. No erythema.  Psychiatric: Judgment normal.      LABORATORY PANEL:   CBC Recent Labs  Lab 08/01/17 1037  WBC 9.1  HGB 13.2  HCT 39.5  PLT 281   ------------------------------------------------------------------------------------------------------------------  Chemistries  Recent Labs  Lab 08/01/17 1037  NA 141  K 3.9  CL 102  CO2 29  GLUCOSE 110*  BUN 26*  CREATININE 0.63  CALCIUM 9.6  AST 38  ALT 27  ALKPHOS 86  BILITOT 0.7   ------------------------------------------------------------------------------------------------------------------  Cardiac Enzymes Recent Labs  Lab 08/01/17 1037  TROPONINI <0.03   ------------------------------------------------------------------------------------------------------------------  RADIOLOGY:  Dg Chest Portable 1 View  Result Date: 08/01/2017 CLINICAL DATA:  Labored breathing, refusing to eat or take medication, former smoking history EXAM: PORTABLE CHEST 1 VIEW COMPARISON:  CT chest 10/02/2014 and chest x-ray of 09/28/2014 and 01/21/2014 FINDINGS: The lobular nodular lesion in the right upper lobe near the apex has been present for over 3-1/2 years now and has not changed,  consistent with a benign process. The lungs are hyperaerated consistent with emphysema. Mediastinal and hilar contours are unremarkable and the pulmonary artery segments may be slightly prominent, which may indicate pulmonary arterial hypertension. The heart is within normal limits in size. No bony abnormality is seen. IMPRESSION: 1. Emphysema.  No definite active process. 2. Stable benign-appearing nodule in the right upper lobe near the apex. 3. Somewhat prominent pulmonary artery segments may indicate a degree of pulmonary arterial hypertension. Electronically Signed   By: Ivar Drape M.D.   On: 08/01/2017 11:12    EKG:  Sinus tachycardia heart rate 150 no ST elevation or depression  IMPRESSION AND PLAN:   67 year old female with history of chronic hypoxic respiratory failure on 2 L oxygen due to COPD and former tobacco dependence who presents to the emergency room with shortness of breath.  1.  Acute on chronic hypoxic respiratory failure in the setting of COPD exacerbation Wean oxygen to  baseline of 2 L  2.  Mild COPD exacerbation: IV Solu-Medrol DuoNeb's and inhalers  3.  Shortness of breath: Patient does present with mild COPD exacerbation however this may also be cardiac in nature. Echocardiogram ordered   4.  Adult failure to thrive: Dietary consultation requested Check TSH, B12 and magnesium level  5.  Sinus tachycardia: Continue telemetry monitor and check TSH.    All the records are reviewed and case discussed with ED provider. Management plans discussed with the patient and she is in agreement  CODE STATUS: DNR  TOTAL TIME TAKING CARE OF THIS PATIENT: 40 minutes.    Marquies Wanat M.D on 08/01/2017 at 1:09 PM  Between 7am to 6pm - Pager - [phone]  After 6pm go to [email or web address] - password [email or web address] [facility]  Office  [phone]  CC: Primary care physician; [facility]

## 2017-08-01 NOTE — Progress Notes (Signed)
Family Meeting Note  Advance Directive:no  Today a meeting took place with the Patient.    The following clinical team members were present during this meeting:MD  The following were discussed:Patient's diagnosis: Shortness of breath COPD exacerbation, mild Adult failure to thrive, Patient's progosis: Unable to determine and Goals for treatment: DNR  Additional follow-up to be provided: Chaplain consultation to create advanced directives  Time spent during discussion: 17 minutes  Melissa Widger, MD

## 2017-08-01 NOTE — ED Notes (Signed)
Report called to floor RN, charge states they do not have a bed in the room she is to go to but a request has already been made and charge states she will call as soon as the bed arrives so the pt can be transported

## 2017-08-01 NOTE — Progress Notes (Signed)
Chaplain responded to an OR for an AD. Pt was asleep and medicated. Pt will be moved to floor. Order should be put in.   08/01/17 1300  Clinical Encounter Type  Visited With Patient  Visit Type Initial  Referral From Physician  Spiritual Encounters  Spiritual Needs Brochure

## 2017-08-01 NOTE — Progress Notes (Signed)
MEDICATION RELATED CONSULT NOTE - INITIAL   Pharmacy Consult for Clozaril  Indication: schizophrenia  Allergies  Allergen Reactions  . Ciprofloxacin Shortness Of Breath and Itching  . Levofloxacin Other (See Comments)    Reaction:  Unknown   . Morphine And Related Nausea And Vomiting  . Sulfa Antibiotics Hives and Itching  . Symbicort [Budesonide-Formoterol Fumarate] Other (See Comments)    Reaction:  Psychotic episode  . Advair Diskus [Fluticasone-Salmeterol] Anxiety    Patient Measurements: Height: 5\' 2"  (157.5 cm) Weight: 90 lb (40.8 kg) IBW/kg (Calculated) : 50.1 Adjusted Body Weight:   Vital Signs: Temp: 98.2 F (36.8 C) (04/23 1445) Temp Source: Oral (04/23 1040) BP: 133/67 (04/23 1445) Pulse Rate: 105 (04/23 1445) Intake/Output from previous day: No intake/output data recorded. Intake/Output from this shift: No intake/output data recorded.  Labs: Recent Labs    08/01/17 1037 08/01/17 1504  WBC 9.1  --   HGB 13.2  --   HCT 39.5  --   PLT 281  --   CREATININE 0.63  --   MG  --  2.0  ALBUMIN 4.5  --   PROT 7.4  --   AST 38  --   ALT 27  --   ALKPHOS 86  --   BILITOT 0.7  --    Estimated Creatinine Clearance: 43.4 mL/min (by C-G formula based on SCr of 0.63 mg/dL).   Microbiology: No results found for this or any previous visit (from the past 720 hour(s)).  Medical History: Past Medical History:  Diagnosis Date  . Anxiety   . Anxiety   . COPD (chronic obstructive pulmonary disease) ([place])   . 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 ALPRAZolam (XANAX) 0.25 MG tablet Take 1 tablet (0.25 mg total) by mouth 3 (three) times daily. (Patient taking differently: Take 0.25 mg by mouth 3 (three) times daily as needed. ) 90 tablet 1 PRN at PRN  . aspirin 81 MG chewable tablet Chew 1 tablet (81 mg total) by mouth before cath procedure. 30 tablet 0 Unknown at Unknown  .  atorvastatin (LIPITOR) 20 MG tablet Take 1 tablet (20 mg total) by mouth daily at 6 PM. 30 tablet 0 Unknown at Unknown  . buPROPion (WELLBUTRIN SR) 150 MG 12 hr tablet Take 150 mg by mouth 2 (two) times daily.   Unknown at Unknown  . Calcium Carbonate-Vitamin D3 (CALCIUM 600-D) 600-400 MG-UNIT TABS Take 1 tablet by mouth daily.   Unknown at Unknown  . enoxaparin (LOVENOX) 40 MG/0.4ML injection Inject 40 mg into the skin daily.   Unknown at Unknown  . levothyroxine (SYNTHROID, LEVOTHROID) 50 MCG tablet Take 50 mcg by mouth every morning.   Unknown at Unknown  . Multiple Vitamin (MULTIVITAMIN WITH MINERALS) TABS tablet Take 1 tablet by mouth daily. 30 tablet 1 Unknown at Unknown  . oxyCODONE (OXY IR/ROXICODONE) 5 MG immediate release tablet Take 5 mg by mouth every 12 (twelve) hours as needed for severe pain.   PRN at PRN  . albuterol (PROVENTIL HFA;VENTOLIN HFA) 108 (90 BASE) MCG/ACT inhaler Inhale 2 puffs into the lungs every 6 (six) hours as needed for wheezing or shortness of breath. (Patient not taking: Reported on 08/01/2017) 1 Inhaler 1 Not Taking at Unknown time  . cholecalciferol (D-VI-SOL) 400 UNIT/ML LIQD Take 1 mL (400 Units total) by mouth daily with breakfast. (Patient not taking: Reported on 08/01/2017) 30 mL 0  Not Taking at Unknown time  . cloZAPine (CLOZARIL) 25 MG tablet Take 3 tablets (75 mg total) by mouth at bedtime. (Patient not taking: Reported on 08/01/2017) 90 tablet 1 Not Taking at Unknown time  . docusate sodium (COLACE) 100 MG capsule Take 2 capsules (200 mg total) by mouth 2 (two) times daily. (Patient not taking: Reported on 08/01/2017) 120 capsule 0 Not Taking at Unknown time  . feeding supplement, ENSURE ENLIVE, (ENSURE ENLIVE) LIQD Take 237 mLs by mouth 3 (three) times daily with meals. (Patient not taking: Reported on 08/01/2017) 30 mL 1 Not Taking at Unknown time  . fluvoxaMINE (LUVOX) 50 MG tablet Take 1 tablet (50 mg total) by mouth 2 (two) times daily. (Patient not taking:  Reported on 08/01/2017) 60 tablet 1 Not Taking at Unknown time  . megestrol (MEGACE) 400 MG/10ML suspension Take 10 mLs (400 mg total) by mouth at bedtime. (Patient not taking: Reported on 08/01/2017) 30 mL 1 Not Taking at Unknown time  . metoprolol tartrate (LOPRESSOR) 25 MG tablet Take 0.5 tablets (12.5 mg total) by mouth 2 (two) times daily. (Patient not taking: Reported on 08/01/2017) 60 tablet 1 Not Taking at Unknown time  . mirtazapine (REMERON) 45 MG tablet Take 1 tablet (45 mg total) by mouth at bedtime. (Patient not taking: Reported on 08/01/2017) 30 tablet 1 Not Taking at Unknown time  . nitroGLYCERIN (NITROSTAT) 0.4 MG SL tablet Place 1 tablet (0.4 mg total) under the tongue every 5 (five) minutes as needed for chest pain. (Patient not taking: Reported on 08/01/2017) 15 tablet 1 Not Taking at Unknown time  . pantoprazole (PROTONIX) 40 MG tablet Take 1 tablet (40 mg total) by mouth daily. (Patient not taking: Reported on 08/01/2017) 30 tablet 1 Not Taking at Unknown time  . senna (SENOKOT) 8.6 MG TABS tablet Take 2 tablets (17.2 mg total) by mouth at bedtime. (Patient not taking: Reported on 08/01/2017) 60 each 1 Not Taking at Unknown time  . tiotropium (SPIRIVA) 18 MCG inhalation capsule Place 1 capsule (18 mcg total) into inhaler and inhale daily. (Patient not taking: Reported on 08/01/2017) 30 capsule 1 Not Taking at Unknown time    Assessment: 08/01/17:   ANC = 6500   Goal of Therapy:    Plan:  Pt is registered with clozaril [facility] program and is eligible to continue treatment.  Pt has been on clozaril for past 3 yrs , next [place] due ~ 01/2018  Vivion Romano D 08/01/2017,7:05 PM

## 2017-08-01 NOTE — ED Notes (Signed)
Pt sent by son for refusing food/meds, and to to go doctor appt.  Pt states "I just don't want to" whey asked why refusing. Is alert and oriented.  Pt ok with RN doing labs and EKG/CXR at this time to evaluate increased WOB

## 2017-08-01 NOTE — ED Triage Notes (Signed)
Pt has notable labored breathing that she does admit is worse than normal.  Son called EMS because refusing to eat, take meds, and will not go to doctor.  Only reason she came was because was told would be IVC if didn't.  Pt ok with RN doing work up for increased [place].  "I just want to go home".  Denies SI
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
## 2017-08-02 LAB — CBC
HCT: 32.7 % — ABNORMAL LOW (ref 35.0–47.0)
Hemoglobin: 11.1 g/dL — ABNORMAL LOW (ref 12.0–16.0)
MCH: 31.7 pg (ref 26.0–34.0)
MCHC: 34.1 g/dL (ref 32.0–36.0)
MCV: 93.1 fL (ref 80.0–100.0)
Platelets: 194 10*3/uL (ref 150–440)
RBC: 3.51 MIL/uL — AB (ref 3.80–5.20)
RDW: 14.7 % — ABNORMAL HIGH (ref 11.5–14.5)
WBC: 5.6 10*3/uL (ref 3.6–11.0)

## 2017-08-02 LAB — BASIC METABOLIC PANEL
Anion gap: 6 (ref 5–15)
BUN: 36 mg/dL — ABNORMAL HIGH (ref 6–20)
CALCIUM: 8.7 mg/dL — AB (ref 8.9–10.3)
CO2: 30 mmol/L (ref 22–32)
Chloride: 104 mmol/L (ref 101–111)
Creatinine, Ser: 0.72 mg/dL (ref 0.44–1.00)
GLUCOSE: 98 mg/dL (ref 65–99)
Potassium: 3.7 mmol/L (ref 3.5–5.1)
SODIUM: 140 mmol/L (ref 135–145)

## 2017-08-02 MED ORDER — METHYLPREDNISOLONE SODIUM SUCC 40 MG IJ SOLR
40.0000 mg | Freq: Every day | INTRAMUSCULAR | Status: DC
  Filled 2017-08-02: qty 1

## 2017-08-02 MED ORDER — IPRATROPIUM-ALBUTEROL 0.5-2.5 (3) MG/3ML IN SOLN
3.0000 mL | Freq: Three times a day (TID) | RESPIRATORY_TRACT | Status: DC
  Administered 2017-08-03 – 2017-08-10 (×22): 3 mL via RESPIRATORY_TRACT
  Filled 2017-08-02 (×22): qty 3

## 2017-08-02 MED ORDER — ENSURE ENLIVE PO LIQD
237.0000 mL | Freq: Three times a day (TID) | ORAL | Status: DC
  Administered 2017-08-02 – 2017-08-10 (×24): 237 mL via ORAL

## 2017-08-02 MED ORDER — ENOXAPARIN SODIUM 40 MG/0.4ML ~~LOC~~ SOLN
30.0000 mg | SUBCUTANEOUS | Status: DC
  Filled 2017-08-02: qty 0.4

## 2017-08-02 MED ORDER — SODIUM CHLORIDE 0.9 % IV BOLUS
500.0000 mL | Freq: Once | INTRAVENOUS | Status: AC
  Administered 2017-08-02: 500 mL via INTRAVENOUS

## 2017-08-02 MED ORDER — ALBUTEROL SULFATE (2.5 MG/3ML) 0.083% IN NEBU
2.5000 mg | INHALATION_SOLUTION | RESPIRATORY_TRACT | Status: DC | PRN
Start: 2017-08-02 — End: 2017-08-10

## 2017-08-02 MED ORDER — ENOXAPARIN SODIUM 30 MG/0.3ML ~~LOC~~ SOLN
30.0000 mg | SUBCUTANEOUS | Status: DC
  Administered 2017-08-03 – 2017-08-09 (×7): 30 mg via SUBCUTANEOUS
  Filled 2017-08-02 (×8): qty 0.3

## 2017-08-02 NOTE — Progress Notes (Addendum)
Called by nursing to report that the patient is very lethargic.  Nurse reports that patient is arousable, and is oriented, but is significantly more lethargic than she was earlier in the shift.  Patient did receive clozapine and olanzapine tonight.  On chart review it seems that psychiatry had some difficulty sorting through what medications the patient had actually been taking at home.  Patient reported to nursing tonight that clozapine was on her home med list but she had not taken it for some time.  Psychiatry recommended starting olanzapine instead of clozapine, however clozapine order was not discontinued and patient got both medications tonight.  I discontinued the clozapine tonight.  Vital signs are otherwise largely stable except that her blood pressure is slightly low.  Patient has not had very good p.o. intake recently, so I have ordered a normal saline bolus at this time.  Discussed with nursing to monitor patient closely as her lethargy is likely medication side effect, and to call back if she has any development of focal neurological deficits or instability develop in her vital signs.  Jacqulyn Bath [facility] 08/02/2017, 12:55 AM

## 2017-08-02 NOTE — Progress Notes (Addendum)
[facility] [place] NAME: Melissa George    MR#:  [id number]  DATE OF BIRTH:  March 30, 1951  SUBJECTIVE:   Pt. Here due to COPD Exacerbation and Failure to thrive.  Patient was a bit lethargic overnight as she got some Zyprexa and was also getting Clozaril.  A bit more awake this morning, p.o. intake continues to be poor.  Patient appears emaciated and weak.  REVIEW OF SYSTEMS:    Review of Systems  Constitutional: Negative for chills and fever.  HENT: Negative for congestion and tinnitus.   Eyes: Negative for blurred vision and double vision.  Respiratory: Negative for cough, shortness of breath and wheezing.   Cardiovascular: Negative for chest pain, orthopnea and PND.  Gastrointestinal: Negative for abdominal pain, diarrhea, nausea and vomiting.  Genitourinary: Negative for dysuria and hematuria.  Neurological: Positive for weakness. Negative for dizziness, sensory change and focal weakness.  All other systems reviewed and are negative.   Nutrition: Heart Healthy Tolerating Diet: Yes Tolerating PT: Await Eval.    DRUG ALLERGIES:   Allergies  Allergen Reactions  . Ciprofloxacin Shortness Of Breath and Itching  . Levofloxacin Other (See Comments)    Reaction:  Unknown   . Morphine And Related Nausea And Vomiting  . Sulfa Antibiotics Hives and Itching  . Symbicort [Budesonide-Formoterol Fumarate] Other (See Comments)    Reaction:  Psychotic episode  . Advair Diskus [Fluticasone-Salmeterol] Anxiety    VITALS:  Blood pressure (!) 106/95, pulse (!) 107, temperature 98.3 F (36.8 C), resp. rate 20, height 5\' 2"  (1.575 m), weight 41 kg (90 lb 6.2 oz), SpO2 98 %.  PHYSICAL EXAMINATION:   Physical Exam  GENERAL:  67 y.o.-year-old thin, cachectic patient lying in bed in no acute distress.  EYES: Pupils equal, round, reactive to light and accommodation. No scleral icterus. Extraocular muscles intact.  HEENT: Head atraumatic,  normocephalic. Oropharynx and nasopharynx clear.  NECK:  Supple, no jugular venous distention. No thyroid enlargement, no tenderness.  LUNGS: Normal breath sounds bilaterally, no wheezing, rales, rhonchi. No use of accessory muscles of respiration.  CARDIOVASCULAR: S1, S2 normal. No murmurs, rubs, or gallops.  ABDOMEN: Soft, nontender, nondistended. Bowel sounds present. No organomegaly or mass.  EXTREMITIES: No cyanosis, clubbing or edema b/l.    NEUROLOGIC: Cranial nerves II through XII are intact. No focal Motor or sensory deficits b/l. Globally weak   PSYCHIATRIC: The patient is alert and oriented x 1.  SKIN: No obvious rash, lesion, or ulcer.    LABORATORY PANEL:   CBC Recent Labs  Lab 08/02/17 0538  WBC 5.6  HGB 11.1*  HCT 32.7*  PLT 194   ------------------------------------------------------------------------------------------------------------------  Chemistries  Recent Labs  Lab 08/01/17 1037 08/01/17 1504 08/02/17 0538  NA 141  --  140  K 3.9  --  3.7  CL 102  --  104  CO2 29  --  30  GLUCOSE 110*  --  98  BUN 26*  --  36*  CREATININE 0.63  --  0.72  CALCIUM 9.6  --  8.7*  MG  --  2.0  --   AST 38  --   --   ALT 27  --   --   ALKPHOS 86  --   --   BILITOT 0.7  --   --    ------------------------------------------------------------------------------------------------------------------  Cardiac Enzymes Recent Labs  Lab 08/01/17 1037  TROPONINI <0.03   ------------------------------------------------------------------------------------------------------------------  RADIOLOGY:  Dg Chest Portable 1 View  Result Date: 08/01/2017 CLINICAL DATA:  Labored breathing, refusing to eat or take medication, former smoking history EXAM: PORTABLE CHEST 1 VIEW COMPARISON:  CT chest 10/02/2014 and chest x-ray of 09/28/2014 and 01/21/2014 FINDINGS: The lobular nodular lesion in the right upper lobe near the apex has been present for over 3-1/2 years now and has not  changed, consistent with a benign process. The lungs are hyperaerated consistent with emphysema. Mediastinal and hilar contours are unremarkable and the pulmonary artery segments may be slightly prominent, which may indicate pulmonary arterial hypertension. The heart is within normal limits in size. No bony abnormality is seen. IMPRESSION: 1. Emphysema.  No definite active process. 2. Stable benign-appearing nodule in the right upper lobe near the apex. 3. Somewhat prominent pulmonary artery segments may indicate a degree of pulmonary arterial hypertension. Electronically Signed   By: Ivar Drape M.D.   On: 08/01/2017 11:12     ASSESSMENT AND PLAN:   67 year old female with past medical history of COPD, anxiety, adult failure to thrive who presented to the hospital due to weakness, shortness of breath.  1.  COPD exacerbation-improved since yesterday.  Patient has no wheezing, bronchospasm. -We will continue IV steroids but will taper them.  Continue Spiriva, duo nebs, empiric doxycycline.  2.  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 -Continue Megace, Ensure supplements. - Will get palliative care consult to discuss goals of care.  Continue Remeron.  3.  Hypothyroidism-continue Synthroid.    4.  GERD-continue Protonix.  5.  Anxiety/depression-continue Remeron, Zyprexa.  Appreciate psychiatric consult, no need for acute inpatient behavioral medicine for now.  6. Essential HTN - cont. Metoprolol.   Physical therapy and palliative care consults.  All the records are reviewed and case discussed with Care Management/Social Worker. Management plans discussed with the patient, family and they are in agreement.  CODE STATUS: DNR  DVT Prophylaxis: Lovenox  TOTAL TIME TAKING CARE OF THIS PATIENT: 30 minutes.   POSSIBLE D/C IN 1-2 DAYS, DEPENDING ON CLINICAL CONDITION.   Henreitta Leber M.D on 08/02/2017 at 1:46 PM  Between 7am to 6pm - Pager -  [phone]  After 6pm go to [email or web address] - Proofreader  [facility]  Office  [phone]  CC: Primary care physician; Patient, No Pcp Per

## 2017-08-02 NOTE — Progress Notes (Signed)
Initial Nutrition Assessment  DOCUMENTATION CODES:   Severe malnutrition in context of chronic illness, Underweight  INTERVENTION:   - Continue Ensure Enlive po TID, each supplement provides 350 kcal and 20 grams of protein  - Recommend liberalizing diet to REGULAR  - Encourage PO intake  NUTRITION DIAGNOSIS:   Severe Malnutrition related to chronic illness(COPD) as evidenced by severe muscle depletion, severe fat depletion.  GOAL:   Patient will meet greater than or equal to 90% of their needs  MONITOR:   PO intake, Supplement acceptance, Weight trends, Labs  REASON FOR ASSESSMENT:   Consult Assessment of nutrition requirement/status  ASSESSMENT:   67 year old female with PMH significant for COPD, depression, and anxiety. Pt presented to ED with SOB after pt's son called EMS because pt had been refusing to take medications, eat, or see her primary care physician.  Spoke with pt at bedside who reports she has "zero" appetite. Pt states she has never had a big appetite but is unable to elaborate regarding how long this has been the case. Pt states she eats "maybe one meal" a day. Pt unsure of the last time she ate prior to admission. Pt with unfinished meal tray in room at time of visit. Pt had completed approximately 10%. Pt states she does not eat eggs. RD to add preference to diet software. Encouraged pt to eat as much of meals as she is able.  Pt typically drinks 1-2 Ensure supplements daily and would like to continue receiving them this admission. Supplied pt with a chocolate Ensure Enlive at time of visit.  Pt states her UBW is 96-97 lbs and that she has weighed this for "50 years." Pt reports her highest weight as 116 lbs. Per weight history in chart, pt's highest weight was 98 lbs in July 2016.  Pt denies issues chewing but does report that she has difficulty swallowing.  Medications reviewed and include: Oscal with vitamin D, 400 units cholecalciferol daily, 200 mg  Colace daily, 50 mcg levothyroxine daily, 400 mg Megace daily, 45 mg Remeron daily, MVI with minerals daily, 40 mg Protonix daily, Senokot daily  Labs reviewed: BUN 36 (H), hemoglobin 11.1 (L), HCT 32.7 (L)  NUTRITION - FOCUSED PHYSICAL EXAM:    Most Recent Value  Orbital Region  Moderate depletion  Upper Arm Region  Severe depletion  Thoracic and Lumbar Region  Severe depletion  Buccal Region  Moderate depletion  Temple Region  Moderate depletion  Clavicle Bone Region  Severe depletion  Clavicle and Acromion Bone Region  Severe depletion  Scapular Bone Region  Moderate depletion  Dorsal Hand  Severe depletion  Patellar Region  Severe depletion  Anterior Thigh Region  Severe depletion  Posterior Calf Region  Severe depletion  Edema (RD Assessment)  None  Hair  Reviewed [falling out]  Eyes  Reviewed  Mouth  Reviewed  Skin  Reviewed  Nails  Reviewed       Diet Order:  Diet Heart Room service appropriate? Yes; Fluid consistency: Thin  EDUCATION NEEDS:   No education needs have been identified at this time  Skin:  Skin Assessment: Reviewed RN Assessment  Last BM:  unknown/PTA  Height:   Ht Readings from Last 1 Encounters:  08/01/17 5\' 2"  (1.575 m)    Weight:   Wt Readings from Last 1 Encounters:  08/02/17 90 lb 6.2 oz (41 kg)    Ideal Body Weight:  50 kg  BMI:  Body mass index is 16.53 kg/m.  Estimated Nutritional Needs:  Kcal:  1300-1500 kcal/day (32-37 kcal/day)  Protein:  50-65 grams/day (1.2-1.5 g/kg)  Fluid:  >/= 1.5 L/day    Gaynell Face, MS, RD, LDN Pager: [phone] Weekend/After Hours: [phone]

## 2017-08-02 NOTE — Progress Notes (Signed)
Patient ordered VTE prophylaxis w/ lovenox 40 mg subq daily. TBW 40.8 kg CrCl 43.4 ml/min.  Considering patient is receiving 1 mg/kg of lovenox, will reduce dose to lovenox 30 mg subq daily.  Tobie Lords, PharmD, BCPS Clinical Pharmacist 08/02/2017

## 2017-08-02 NOTE — Progress Notes (Signed)
Notified MD of low BP per MD place order for 1/2 liter bolus of normal saline bolus and recheck BP.
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
## 2017-08-02 NOTE — Clinical Social Work Note (Signed)
Bedside nurse contacted CSW and informed this social worker that an [name] is asking questions about patient.  CSW met with APS worker [name], and she said there was an APS referral out on patient.  CSW discussed psychiatry note and H and P regarding patient.  APS worker stated she will be opening a case on patient in regards to possible guardianship needs and inability to take care of herself.  CSW to continue to follow patient's progress, pending PT recommendations.  [name]. [name], MSW, [place]  08/02/2017 2:30 PM

## 2017-08-02 NOTE — Progress Notes (Addendum)
Per Dr. Verdell Carmine hold metoprolol for low BP and discontinue telemetry.

## 2017-08-03 LAB — ECHOCARDIOGRAM COMPLETE
Height: 62 in
Weight: 1440 oz

## 2017-08-03 MED ORDER — PREDNISONE 20 MG PO TABS: 30.0000 mg | ORAL_TABLET | Freq: Every day | ORAL | Status: DC

## 2017-08-03 MED ORDER — PREDNISONE 20 MG PO TABS
40.0000 mg | ORAL_TABLET | Freq: Every day | ORAL | Status: DC
  Administered 2017-08-05: 40 mg via ORAL
  Filled 2017-08-03: qty 2

## 2017-08-03 MED ORDER — PREDNISONE 20 MG PO TABS: 20.0000 mg | ORAL_TABLET | Freq: Every day | ORAL | Status: DC

## 2017-08-03 MED ORDER — PREDNISONE 50 MG PO TABS
50.0000 mg | ORAL_TABLET | Freq: Every day | ORAL | Status: DC
  Administered 2017-08-04 – 2017-08-05 (×2): 50 mg via ORAL
  Filled 2017-08-03 (×2): qty 1

## 2017-08-03 MED ORDER — PREDNISONE 20 MG PO TABS: 10.0000 mg | ORAL_TABLET | Freq: Every day | ORAL | Status: DC

## 2017-08-03 NOTE — Clinical Social Work Note (Signed)
CSW attempted to contact APS to get an updated on who has been assigned Melissa George's case.  CSW left a message on [employer].  CSW spoke with Melissa George's son and informed him that APS has been assigned for Melissa George and they will determine what the next steps are going to be to place this Melissa George.  Melissa George's son [name], said that neither he nor his brother would like to pursue guardianship, and they would like the state to pursue guardianship for Melissa George.  Melissa George's son states she can not live on her own, and he nor his brother are willing to take Melissa George into their homes.  CSW and case manager were speaking with Melissa George's son and informed him that because they are not willing to take any responsibility for Melissa George it is considered abandonment.  CSW was also informed that psych is recommending geripsych placement for Melissa George.  CSW to look for placement for Melissa George.  [name]. Norval Morton, MSW, [place]  08/03/2017 7:56 PM

## 2017-08-03 NOTE — Progress Notes (Signed)
PT Cancellation Note  Patient Details Name: DANIA MARSAN MRN: [id number] DOB: 09-12-1950   Cancelled Treatment:    Reason Eval/Treat Not Completed: Other (comment). Upon entering room, pt curled up in bed on side and does not acknowledge PT. Kneeled down on floor and talked to pt. She feels very stressed at this time and reports she can't focus on mobility or exercises at this time. Discussed benefits of therapy, however pt politely refuses and reports there is nothing therapist can do to make her more comfortable. Will re-attempt next date if pt willing.   Berlinda Farve 08/03/2017, 10:56 AM  Greggory Stallion, PT, DPT [phone]

## 2017-08-03 NOTE — Care Management (Signed)
Patient son was a bedside earlier today and briefly met with RNCM and CSW.  Per son patient was in multiple facilities when she was in [place], but no longer accepted to any facility.  She now lives in her home alone.  Son [name] does live locally.  Son states that patient refuses to go to doctors appointments, refuses to take medications, and refuses any type of services in the home.  [name] state that neither him or his brother are willing to take any financial responsibly for the patient, nor want to be POA.   Son states "I want the state to take over her guardianship".  APS is already following the case and has been in touch with CSW.  Patient has refused PT while in patient, is being followed by psych who is recommending geri psych when patient is stable.  Palliative care has also been consulted.  Son left today to go to [place], however states he will be available by phone.

## 2017-08-03 NOTE — Progress Notes (Signed)
Patient was quiet, spoke in a whisper, and seemed down and hopeless. She spoke about the difficult relationships with her children and thinks they will not take care of her. She said, "They are done with me." Patient has lost her faith, claiming that this is her fault. She talked about being afraid to be home and found some measure of security in the hospital. She fears everything. Chaplain offered active listening, reflective questions, and emotional support as the patient spoke about her life. She believes she is facing a financial crisis without help. She admits that she rejected an attempt to provide in-home assistance. Patient believes the hospital wants her out and that she has no way to get home. I suggested a follow-up social work consult. She declined the suggestion. Patient did not know what state she was in, but did know that she was in [facility]. Chaplain will follow-up later today.

## 2017-08-03 NOTE — Progress Notes (Signed)
[facility] NAME: Melissa George    MR#:  [id number]  DATE OF BIRTH:  02/26/1951  SUBJECTIVE:   Patient remains confused, appears depressed and is not eating much and not taking her pills.  No other acute events overnight.  Physical therapy at home to work with her but she refused.  REVIEW OF SYSTEMS:    Review of Systems  Constitutional: Negative for chills and fever.  HENT: Negative for congestion and tinnitus.   Eyes: Negative for blurred vision and double vision.  Respiratory: Negative for cough, shortness of breath and wheezing.   Cardiovascular: Negative for chest pain, orthopnea and PND.  Gastrointestinal: Negative for abdominal pain, diarrhea, nausea and vomiting.  Genitourinary: Negative for dysuria and hematuria.  Neurological: Positive for weakness. Negative for dizziness, sensory change and focal weakness.  All other systems reviewed and are negative.   Nutrition: Heart Healthy Tolerating Diet: Yes Tolerating PT: Await Eval.    DRUG ALLERGIES:   Allergies  Allergen Reactions  . Ciprofloxacin Shortness Of Breath and Itching  . Levofloxacin Other (See Comments)    Reaction:  Unknown   . Morphine And Related Nausea And Vomiting  . Sulfa Antibiotics Hives and Itching  . Symbicort [Budesonide-Formoterol Fumarate] Other (See Comments)    Reaction:  Psychotic episode  . Advair Diskus [Fluticasone-Salmeterol] Anxiety    VITALS:  Blood pressure 110/71, pulse 76, temperature 98.7 F (37.1 C), resp. rate 18, height 5\' 2"  (1.575 m), weight 43.6 kg (96 lb 3.2 oz), SpO2 100 %.  PHYSICAL EXAMINATION:   Physical Exam  GENERAL:  67 y.o.-year-old thin, cachectic patient lying in bed in no acute distress.  EYES: Pupils equal, round, reactive to light and accommodation. No scleral icterus. Extraocular muscles intact.  HEENT: Head atraumatic, normocephalic. Oropharynx and nasopharynx clear.  NECK:  Supple, no jugular  venous distention. No thyroid enlargement, no tenderness.  LUNGS: Normal breath sounds bilaterally, no wheezing, rales, rhonchi. No use of accessory muscles of respiration.  CARDIOVASCULAR: S1, S2 normal. No murmurs, rubs, or gallops.  ABDOMEN: Soft, nontender, nondistended. Bowel sounds present. No organomegaly or mass.  EXTREMITIES: No cyanosis, clubbing or edema b/l.    NEUROLOGIC: Cranial nerves II through XII are intact. No focal Motor or sensory deficits b/l. Globally weak   PSYCHIATRIC: The patient is alert and oriented x 1.  SKIN: No obvious rash, lesion, or ulcer.    LABORATORY PANEL:   CBC Recent Labs  Lab 08/02/17 0538  WBC 5.6  HGB 11.1*  HCT 32.7*  PLT 194   ------------------------------------------------------------------------------------------------------------------  Chemistries  Recent Labs  Lab 08/01/17 1037 08/01/17 1504 08/02/17 0538  NA 141  --  140  K 3.9  --  3.7  CL 102  --  104  CO2 29  --  30  GLUCOSE 110*  --  98  BUN 26*  --  36*  CREATININE 0.63  --  0.72  CALCIUM 9.6  --  8.7*  MG  --  2.0  --   AST 38  --   --   ALT 27  --   --   ALKPHOS 86  --   --   BILITOT 0.7  --   --    ------------------------------------------------------------------------------------------------------------------  Cardiac Enzymes Recent Labs  Lab 08/01/17 1037  TROPONINI <0.03   ------------------------------------------------------------------------------------------------------------------  RADIOLOGY:  No results found.   ASSESSMENT AND PLAN:   67 year old female with past medical history of COPD, anxiety, adult failure  to thrive who presented to the hospital due to weakness, shortness of breath.  1.  COPD exacerbation-Patient has no wheezing, bronchospasm. - Will switch from IV Steroids to Oral Pred. Taper today.  -  Continue Spiriva, duo nebs, empiric doxycycline.  2.  Adult failure to thrive-secondary to underlying dementia probably  end-stage COPD. -Continue Megace, Ensure supplements. - Continue Remeron.  Await palliative care consult.  3.  Hypothyroidism-continue Synthroid.    4.  GERD-continue Protonix.  5.  Anxiety/depression-continue Remeron, Zyprexa.  Appreciate psychiatric consult, no need for acute inpatient behavioral medicine for now.  6. Essential HTN - cont. Metoprolol.   Patient refuses to work with physical therapy, not eating and drinking well.  Patient will be a good hospice candidate, await palliative care input.  All the records are reviewed and case discussed with Care Management/Social Worker. Management plans discussed with the patient, family and they are in agreement.  CODE STATUS: DNR  DVT Prophylaxis: Lovenox  TOTAL TIME TAKING CARE OF THIS PATIENT: 30 minutes.   POSSIBLE D/C IN 1-2 DAYS, DEPENDING ON CLINICAL CONDITION.   Henreitta Leber M.D on 08/03/2017 at 11:49 AM  Between 7am to 6pm - Pager - [phone]  After 6pm go to [email or web address] - Proofreader  [facility]  Office  [phone]  CC: Primary care physician; Patient, No Pcp Per
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
## 2017-08-03 NOTE — Plan of Care (Signed)
Pt continues to have a poor appetite and does not want all of her meds

## 2017-08-04 MED ORDER — DOXYCYCLINE HYCLATE 100 MG PO TABS
100.0000 mg | ORAL_TABLET | Freq: Two times a day (BID) | ORAL | Status: AC
  Administered 2017-08-04 – 2017-08-06 (×4): 100 mg via ORAL
  Filled 2017-08-04 (×4): qty 1

## 2017-08-04 NOTE — Evaluation (Signed)
Physical Therapy Evaluation Patient Details Name: Melissa George MRN: [id number] DOB: 01/28/1951 Today's Date: 08/04/2017   History of Present Illness  Pt admitted for depression with COPD exacerbation. Pt with complaints of refusing to eat/take meds along with SOB symptoms. History includes anxiety and COPD on 2L of O2 at baseline. Psych recommending geri psych at discharge.  Clinical Impression  Pt is a pleasant 67 year old female who was admitted for depression and COPD exacerbation. Pt performs bed mobility with independence, transfers with mod I, and ambulation with cga and RW. All mobility performed on 2L of O2 with sats WNL. Pt fatigues quickly with limited exertion, reports she usually only ambulates household distances at home, however community mobility is difficulty due to decreased endurance. Reports her O2 tubing doesn't reach throughout house. Pt demonstrates deficits with mobility/endurance. Lack of motivation to perform therapy and reports she was told she "has" to participate although doesn't "want" to participate. Educated on benefits of mobility with RN staff and therapy to prevent muscle atrophy. She appears close to baseline level at this time. Would benefit from skilled PT to address above deficits and promote optimal return to PLOF. Recommend transition to [facility] upon discharge from acute hospitalization.       Follow Up Recommendations Home health PT;Supervision for mobility/OOB    Equipment Recommendations  [facility] for long distances)    Recommendations for Other Services       Precautions / Restrictions Precautions Precautions: Fall Restrictions Weight Bearing Restrictions: No      Mobility  Bed Mobility Overal bed mobility: Independent             General bed mobility comments: safe technique without assist required  Transfers Overall transfer level: Modified independent Equipment used: Rolling walker (2 wheeled)             General transfer  comment: transfers performed without assist. Cues to push from seated surface prior to standing. All mobility performed on 2L of O2.  Ambulation/Gait Ambulation/Gait assistance: Min guard Ambulation Distance (Feet): 30 Feet Assistive device: Rolling walker (2 wheeled) Gait Pattern/deviations: Step-to pattern     General Gait Details: ambulated in room initially with cautious stepping and heavy reliance on RW. O2 sats remain WNL although pt rates RPE on 8/10. Further ambulation in ther-ex.  Stairs            Wheelchair Mobility    Modified Rankin (Stroke Patients Only)       Balance Overall balance assessment: Needs assistance Sitting-balance support: Feet supported Sitting balance-Leahy Scale: Good     Standing balance support: Bilateral upper extremity supported Standing balance-Leahy Scale: Good                               Pertinent Vitals/Pain Pain Assessment: No/denies pain    Home Living Family/patient expects to be discharged to:: Private residence Living Arrangements: Alone Available Help at Discharge: (none) Type of Home: House Home Access: Stairs to enter Entrance Stairs-Rails: None Entrance Stairs-Number of Steps: 3-4 steps Home Layout: One level Home Equipment: Environmental consultant - 4 wheels Additional Comments: is requesting WC for longer distances    Prior Function Level of Independence: Independent with assistive device(s)         Comments: currently using rollater for all mobility, however reports quick fatigue     Hand Dominance        Extremity/Trunk Assessment   Upper Extremity Assessment Upper Extremity Assessment: Overall  WFL for tasks assessed    Lower Extremity Assessment Lower Extremity Assessment: Overall WFL for tasks assessed       Communication   Communication: No difficulties  Cognition Arousal/Alertness: Awake/alert Behavior During Therapy: Flat affect Overall Cognitive Status: Within Functional Limits for  tasks assessed                                        General Comments      Exercises Other Exercises Other Exercises: Pt ambulated with x 60' in hallway with RW and 2L of O2. Supervision given due to improvement in gait pattern with cues for reciprocal gait and good speed. Rates RPE at 9/10 with exertion, further ambulation deferred at this time. Pt then agreeable to sit in recliner.   Assessment/Plan    PT Assessment Patient needs continued PT services  PT Problem List Decreased strength;Cardiopulmonary status limiting activity;Decreased activity tolerance;Decreased balance;Decreased mobility       PT Treatment Interventions Gait training;DME instruction;Therapeutic exercise;Balance training    PT Goals (Current goals can be found in the Care Plan section)  Acute Rehab PT Goals Patient Stated Goal: to go home PT Goal Formulation: With patient Time For Goal Achievement: 08/18/17 Potential to Achieve Goals: Fair    Frequency Min 2X/week   Barriers to discharge Decreased caregiver support      Co-evaluation               AM-PAC PT "6 Clicks" Daily Activity  Outcome Measure Difficulty turning over in bed (including adjusting bedclothes, sheets and blankets)?: None Difficulty moving from lying on back to sitting on the side of the bed? : None Difficulty sitting down on and standing up from a chair with arms (e.g., wheelchair, bedside commode, etc,.)?: None Help needed moving to and from a bed to chair (including a wheelchair)?: None Help needed walking in hospital room?: A Little Help needed climbing 3-5 steps with a railing? : A Little 6 Click Score: 22    End of Session Equipment Utilized During Treatment: Gait belt;Oxygen Activity Tolerance: Patient tolerated treatment well Patient left: in chair;with chair alarm set Nurse Communication: Mobility status PT Visit Diagnosis: Muscle weakness (generalized) (M62.81);Difficulty in walking, not  elsewhere classified (R26.2)    Time: 6256-3893 PT Time Calculation (min) (ACUTE ONLY): 19 min   Charges:   PT Evaluation $PT Eval Low Complexity: 1 Low PT Treatments $Gait Training: 8-22 mins   PT G CodesGreggory Stallion, PT, DPT [phone]   Jaidyn Usery 08/04/2017, 1:29 PM

## 2017-08-04 NOTE — Plan of Care (Signed)
Pt has taken all of her meds today and worked with PT

## 2017-08-04 NOTE — Clinical Social Work Note (Signed)
CSW faxed clinical information to [facility],  [facility], [place], they can accept patient with chronic oxygen, awaiting bed offers.  [name]. Norval Morton, MSW, [place]  08/04/2017 6:23 PM

## 2017-08-04 NOTE — Clinical Social Work Note (Signed)
CSW received phone call from Otilio Connors at [place], [phone], and she requested copy of psych note, and conversation with patient's family in regards to abandonment issues.  CSW informed her that psych is recommending Ger[facility] placement, CSW will work on trying to find placement for patient.  CSW to continue to follow patient's progress throughout discharge planning.  [name]. [place], MSW, [place]  08/04/2017 1:06 PM

## 2017-08-04 NOTE — Care Management Important Message (Signed)
Copy of signed IM left in patient's room.    

## 2017-08-04 NOTE — Progress Notes (Signed)
[facility] NAME: Melissa George    MR#:  [id number]  DATE OF BIRTH:  Aug 20, 1950  SUBJECTIVE:   She is more awake and cooperative today, worked with physical therapy today.  No other acute events overnight.  Patient eating a little bit more.  REVIEW OF SYSTEMS:    Review of Systems  Constitutional: Negative for chills and fever.  HENT: Negative for congestion and tinnitus.   Eyes: Negative for blurred vision and double vision.  Respiratory: Negative for cough, shortness of breath and wheezing.   Cardiovascular: Negative for chest pain, orthopnea and PND.  Gastrointestinal: Negative for abdominal pain, diarrhea, nausea and vomiting.  Genitourinary: Negative for dysuria and hematuria.  Neurological: Positive for weakness. Negative for dizziness, sensory change and focal weakness.  All other systems reviewed and are negative.   Nutrition: Heart Healthy Tolerating Diet: Yes Tolerating PT: Eval noted.    DRUG ALLERGIES:   Allergies  Allergen Reactions  . Ciprofloxacin Shortness Of Breath and Itching  . Levofloxacin Other (See Comments)    Reaction:  Unknown   . Morphine And Related Nausea And Vomiting  . Sulfa Antibiotics Hives and Itching  . Symbicort [Budesonide-Formoterol Fumarate] Other (See Comments)    Reaction:  Psychotic episode  . Advair Diskus [Fluticasone-Salmeterol] Anxiety    VITALS:  Blood pressure 115/76, pulse 93, temperature (!) 97.4 F (36.3 C), temperature source Oral, resp. rate 16, height 5\' 2"  (1.575 m), weight 41 kg (90 lb 4.8 oz), SpO2 98 %.  PHYSICAL EXAMINATION:   Physical Exam  GENERAL:  67 y.o.-year-old thin, cachectic patient lying in bed in no acute distress.  EYES: Pupils equal, round, reactive to light and accommodation. No scleral icterus. Extraocular muscles intact.  HEENT: Head atraumatic, normocephalic. Oropharynx and nasopharynx clear.  NECK:  Supple, no jugular venous distention.  No thyroid enlargement, no tenderness.  LUNGS: Normal breath sounds bilaterally, no wheezing, rales, rhonchi. No use of accessory muscles of respiration.  CARDIOVASCULAR: S1, S2 normal. No murmurs, rubs, or gallops.  ABDOMEN: Soft, nontender, nondistended. Bowel sounds present. No organomegaly or mass.  EXTREMITIES: No cyanosis, clubbing or edema b/l.    NEUROLOGIC: Cranial nerves II through XII are intact. No focal Motor or sensory deficits b/l. Globally weak   PSYCHIATRIC: The patient is alert and oriented x 3.  SKIN: No obvious rash, lesion, or ulcer.    LABORATORY PANEL:   CBC Recent Labs  Lab 08/02/17 0538  WBC 5.6  HGB 11.1*  HCT 32.7*  PLT 194   ------------------------------------------------------------------------------------------------------------------  Chemistries  Recent Labs  Lab 08/01/17 1037 08/01/17 1504 08/02/17 0538  NA 141  --  140  K 3.9  --  3.7  CL 102  --  104  CO2 29  --  30  GLUCOSE 110*  --  98  BUN 26*  --  36*  CREATININE 0.63  --  0.72  CALCIUM 9.6  --  8.7*  MG  --  2.0  --   AST 38  --   --   ALT 27  --   --   ALKPHOS 86  --   --   BILITOT 0.7  --   --    ------------------------------------------------------------------------------------------------------------------  Cardiac Enzymes Recent Labs  Lab 08/01/17 1037  TROPONINI <0.03   ------------------------------------------------------------------------------------------------------------------  RADIOLOGY:  No results found.   ASSESSMENT AND PLAN:   67 year old female with past medical history of COPD, anxiety, adult failure to thrive who presented  to the hospital due to weakness, shortness of breath.  1.  COPD exacerbation-Patient has no wheezing, bronchospasm. - cont. Pre. Taper. Cont. Doxy for a 2 more days.  -  Continue Spiriva, duo nebs  2.  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 -Continue Megace, Ensure  supplements. - Continue Remeron.  Await palliative care consult.  3.  Hypothyroidism-continue Synthroid.    4.  GERD-continue Protonix.  5.  Anxiety/depression-continue Remeron, Zyprexa.  Appreciate psychiatric consult, and as per psychiatry patient likely needs inpatient psychiatric help given her severe depression.  Patient would benefit from a [place] psychiatric facility.  Social work aware and doing bed search.  6. Essential HTN - cont. Metoprolol.   Patient worked with physical therapy today and the recommend home health services.   All the records are reviewed and case discussed with Care Management/Social Worker. Management plans discussed with the patient, family and they are in agreement.  CODE STATUS: DNR  DVT Prophylaxis: Lovenox  TOTAL TIME TAKING CARE OF THIS PATIENT: 30 minutes.   POSSIBLE D/C unclear, DEPENDING ON CLINICAL CONDITION and course.    Henreitta Leber M.D on 08/04/2017 at 1:38 PM  Between 7am to 6pm - Pager - [phone]  After 6pm go to [email or web address] - Proofreader  [facility]  Office  [phone]  CC: Primary care physician; Patient, No Pcp Per

## 2017-08-05 MED ORDER — PREDNISONE 20 MG PO TABS
20.0000 mg | ORAL_TABLET | Freq: Every day | ORAL | Status: DC
  Administered 2017-08-06: 20 mg via ORAL
  Filled 2017-08-05: qty 1

## 2017-08-05 MED ORDER — ASPIRIN 81 MG PO CHEW
CHEWABLE_TABLET | ORAL | Status: AC
  Administered 2017-08-05: 81 mg
  Filled 2017-08-05: qty 1

## 2017-08-05 MED ORDER — ASPIRIN 81 MG PO CHEW
81.0000 mg | CHEWABLE_TABLET | Freq: Every day | ORAL | Status: DC
  Administered 2017-08-06 – 2017-08-10 (×5): 81 mg via ORAL
  Filled 2017-08-05 (×5): qty 1

## 2017-08-05 NOTE — Progress Notes (Signed)
Patient ID: Melissa George, female   DOB: Jan 14, 1951, 67 y.o.   MRN: [id number]  [facility] PROGRESS NOTE  Melissa George CBJ:[id number] DOB: [birth year] DOA: 08/01/2017 PCP: Patient, No Pcp Per  HPI/Subjective: Patient stressed out about bills and not having a good relationship with her children.  Poor appetite.  Objective: Vitals:   08/05/17 1209 08/05/17 1330  BP: 100/62   Pulse: 85   Resp: 18   Temp: 98.7 F (37.1 C)   SpO2: 96% 97%    Filed Weights   08/03/17 0620 08/04/17 0440 08/05/17 0418  Weight: 43.6 kg (96 lb 3.2 oz) 41 kg (90 lb 4.8 oz) 40.6 kg (89 lb 8.1 oz)    ROS: Review of Systems  Constitutional: Negative for chills and fever.  Eyes: Negative for blurred vision.  Respiratory: Negative for cough and shortness of breath.   Cardiovascular: Negative for chest pain.  Gastrointestinal: Positive for constipation. Negative for abdominal pain, diarrhea, nausea and vomiting.  Genitourinary: Negative for dysuria.  Musculoskeletal: Negative for joint pain.  Neurological: Negative for dizziness and headaches.   Exam: Physical Exam  Constitutional: She is oriented to person, place, and time.  HENT:  Nose: No mucosal edema.  Mouth/Throat: No oropharyngeal exudate or posterior oropharyngeal edema.  Eyes: Pupils are equal, round, and reactive to light. Conjunctivae, EOM and lids are normal.  Neck: No JVD present. Carotid bruit is not present. No edema present. No thyroid mass and no thyromegaly present.  Cardiovascular: S1 normal and S2 normal. Exam reveals no gallop.  No murmur heard. Pulses:      Dorsalis pedis pulses are 2+ on the right side, and 2+ on the left side.  Respiratory: No respiratory distress. She has no wheezes. She has no rhonchi. She has no rales.  GI: Soft. Bowel sounds are normal. There is no tenderness.  Musculoskeletal:       Right ankle: She exhibits no swelling.       Left ankle: She exhibits no swelling.  Lymphadenopathy:    She  has no cervical adenopathy.  Neurological: She is alert and oriented to person, place, and time. No cranial nerve deficit.  Skin: Skin is warm. No rash noted. Nails show no clubbing.  Psychiatric: She has a normal mood and affect.      Data Reviewed: Basic Metabolic Panel: Recent Labs  Lab 08/01/17 1037 08/01/17 1504 08/02/17 0538  NA 141  --  140  K 3.9  --  3.7  CL 102  --  104  CO2 29  --  30  GLUCOSE 110*  --  98  BUN 26*  --  36*  CREATININE 0.63  --  0.72  CALCIUM 9.6  --  8.7*  MG  --  2.0  --    Liver Function Tests: Recent Labs  Lab 08/01/17 1037  AST 38  ALT 27  ALKPHOS 86  BILITOT 0.7  PROT 7.4  ALBUMIN 4.5   CBC: Recent Labs  Lab 08/01/17 1037 08/02/17 0538  WBC 9.1 5.6  NEUTROABS 6.5  --   HGB 13.2 11.1*  HCT 39.5 32.7*  MCV 92.6 93.1  PLT 281 194   Cardiac Enzymes: Recent Labs  Lab 08/01/17 1037  TROPONINI <0.03    Scheduled Meds: . [START ON 08/06/2017] aspirin  81 mg Oral Daily  . atorvastatin  20 mg Oral q1800  . calcium-vitamin D  1 tablet Oral Daily  . cholecalciferol  400 Units Oral Q breakfast  . docusate sodium  200 mg Oral BID  . doxycycline  100 mg Oral Q12H  . enoxaparin  30 mg Subcutaneous Q24H  . feeding supplement (ENSURE ENLIVE)  237 mL Oral TID BM  . ipratropium-albuterol  3 mL Nebulization TID  . levothyroxine  50 mcg Oral BH-q7a  . megestrol  400 mg Oral QHS  . metoprolol tartrate  12.5 mg Oral BID  . mirtazapine  45 mg Oral QHS  . multivitamin with minerals  1 tablet Oral Daily  . OLANZapine  5 mg Oral QHS  . pantoprazole  40 mg Oral Daily  . predniSONE  50 mg Oral Q breakfast   And  . predniSONE  40 mg Oral Q breakfast   And  . [START ON 08/06/2017] predniSONE  30 mg Oral Q breakfast   And  . [START ON 08/07/2017] predniSONE  20 mg Oral Q breakfast   And  . [START ON 08/08/2017] predniSONE  10 mg Oral Q breakfast  . senna  2 tablet Oral QHS  . tiotropium  18 mcg Inhalation Daily   Continuous  Infusions:  Assessment/Plan:  1. COPD exacerbation.  We will do a quicker prednisone taper.  On doxycycline also. 2. Failure to thrive on Megace and Ensure and on Remeron 3. Hypothyroidism unspecified on Synthroid 4. GERD on Protonix 5. Anxiety depression on Remeron and Zyprexa.  Social work looking into a [facility] facility 6. Essential hypertension on metoprolol 7. Chronic hypoxic respiratory failure on oxygen  Code Status:     Code Status Orders  (From admission, onward)        Start     Ordered   08/01/17 1309  Do not attempt resuscitation (DNR)  Continuous    Question Answer Comment  In the event of cardiac or respiratory ARREST Do not call a "code blue"   In the event of cardiac or respiratory ARREST Do not perform Intubation, CPR, defibrillation or ACLS   In the event of cardiac or respiratory ARREST Use medication by any route, position, wound care, and other measures to relive pain and suffering. May use oxygen, suction and manual treatment of airway obstruction as needed for comfort.      08/01/17 1308    Code Status History    Date Active Date Inactive Code Status Order ID Comments User Context   11/06/2014 2158 11/15/2014 1127 Full Code [id number]  Gonzella Lex, MD Inpatient   11/01/2014 1841 11/05/2014 2135 Full Code [id number]  Gonzella Lex, MD Inpatient   10/29/2014 0225 11/01/2014 1841 Full Code [id number]  Lytle Butte, MD Inpatient   09/13/2014 0657 10/29/2014 0225 Full Code [id number]  Gonzella Lex, MD Inpatient   03/11/2014 1348 03/12/2014 0325 Full Code [id number]  Camprubi-Soms, Patty Sermons, PA-C ED     Disposition Plan: TBD  Antibiotics:  Doxycycline  Time spent: 28 minutes  [place]

## 2017-08-05 NOTE — Clinical Social Work Note (Signed)
CSW has called the geropsych facilities listed as able to accept chronic o2 to ensure the referral had been received and to inquire as to bed availability. [facility] did not have intake administrators available this weekend to discuss. They will be available during regular business hours on Monday. [facility] has no bed available and will follow-up with the referral on Monday as they discharge patients. CSW is following.  Santiago Bumpers, MSW, Latanya Presser [phone]

## 2017-08-06 MED ORDER — DRONABINOL 2.5 MG PO CAPS
5.0000 mg | ORAL_CAPSULE | Freq: Two times a day (BID) | ORAL | Status: DC
  Administered 2017-08-06 – 2017-08-09 (×8): 5 mg via ORAL
  Filled 2017-08-06 (×9): qty 2

## 2017-08-06 MED ORDER — PREDNISONE 20 MG PO TABS
10.0000 mg | ORAL_TABLET | Freq: Every day | ORAL | Status: DC
  Administered 2017-08-07: 10 mg via ORAL
  Filled 2017-08-06: qty 1

## 2017-08-06 NOTE — Progress Notes (Signed)
Patient ID: Melissa George, female   DOB: November 11, 1950, 67 y.o.   MRN: [id number]   [facility] PROGRESS NOTE  Melissa George [id number]:[id number] DOB: Nov 15, 1950 DOA: 08/01/2017 PCP: Patient, No Pcp Per  HPI/Subjective: Physically feels okay.  Was lying in fetal position when I saw her.  Able to follow commands and answer questions.  Objective: Vitals:   08/06/17 1357 08/06/17 1442  BP:  (!) 105/53  Pulse: 88 (!) 102  Resp: 16 14  Temp:  98.3 F (36.8 C)  SpO2: 98% 97%    Filed Weights   08/04/17 0440 08/05/17 0418 08/06/17 0455  Weight: 41 kg (90 lb 4.8 oz) 40.6 kg (89 lb 8.1 oz) 40.9 kg (90 lb 2.7 oz)    ROS: Review of Systems  Constitutional: Negative for chills and fever.  Eyes: Negative for blurred vision.  Respiratory: Negative for cough and shortness of breath.   Cardiovascular: Negative for chest pain.  Gastrointestinal: Positive for constipation. Negative for abdominal pain, diarrhea, nausea and vomiting.  Genitourinary: Negative for dysuria.  Musculoskeletal: Negative for joint pain.  Neurological: Negative for dizziness and headaches.   Exam: Physical Exam  Constitutional: She is oriented to person, place, and time.  HENT:  Nose: No mucosal edema.  Mouth/Throat: No oropharyngeal exudate or posterior oropharyngeal edema.  Eyes: Pupils are equal, round, and reactive to light. Conjunctivae, EOM and lids are normal.  Neck: No JVD present. Carotid bruit is not present. No edema present. No thyroid mass and no thyromegaly present.  Cardiovascular: S1 normal and S2 normal. Exam reveals no gallop.  No murmur heard. Pulses:      Dorsalis pedis pulses are 2+ on the right side, and 2+ on the left side.  Respiratory: No respiratory distress. She has no wheezes. She has no rhonchi. She has no rales.  GI: Soft. Bowel sounds are normal. There is no tenderness.  Musculoskeletal:       Right ankle: She exhibits no swelling.       Left ankle: She exhibits no swelling.   Lymphadenopathy:    She has no cervical adenopathy.  Neurological: She is alert and oriented to person, place, and time. No cranial nerve deficit.  Skin: Skin is warm. No rash noted. Nails show no clubbing.  Psychiatric: Her affect is blunt.      Data Reviewed: Basic Metabolic Panel: Recent Labs  Lab 08/01/17 1037 08/01/17 1504 08/02/17 0538  NA 141  --  140  K 3.9  --  3.7  CL 102  --  104  CO2 29  --  30  GLUCOSE 110*  --  98  BUN 26*  --  36*  CREATININE 0.63  --  0.72  CALCIUM 9.6  --  8.7*  MG  --  2.0  --    Liver Function Tests: Recent Labs  Lab 08/01/17 1037  AST 38  ALT 27  ALKPHOS 86  BILITOT 0.7  PROT 7.4  ALBUMIN 4.5   CBC: Recent Labs  Lab 08/01/17 1037 08/02/17 0538  WBC 9.1 5.6  NEUTROABS 6.5  --   HGB 13.2 11.1*  HCT 39.5 32.7*  MCV 92.6 93.1  PLT 281 194   Cardiac Enzymes: Recent Labs  Lab 08/01/17 1037  TROPONINI <0.03    Scheduled Meds: . aspirin  81 mg Oral Daily  . atorvastatin  20 mg Oral q1800  . calcium-vitamin D  1 tablet Oral Daily  . cholecalciferol  400 Units Oral Q breakfast  . docusate  sodium  200 mg Oral BID  . dronabinol  5 mg Oral BID AC  . enoxaparin  30 mg Subcutaneous Q24H  . feeding supplement (ENSURE ENLIVE)  237 mL Oral TID BM  . ipratropium-albuterol  3 mL Nebulization TID  . levothyroxine  50 mcg Oral BH-q7a  . metoprolol tartrate  12.5 mg Oral BID  . mirtazapine  45 mg Oral QHS  . multivitamin with minerals  1 tablet Oral Daily  . OLANZapine  5 mg Oral QHS  . pantoprazole  40 mg Oral Daily  . predniSONE  20 mg Oral Q breakfast  . senna  2 tablet Oral QHS  . tiotropium  18 mcg Inhalation Daily   Continuous Infusions:  Assessment/Plan:  1. COPD exacerbation.  We will do a quicker prednisone taper.  On doxycycline also. 2. Failure to thrive.  Start Marinol.  Stop Megace.  Continue Ensure and Remeron 3. Hypothyroidism unspecified on Synthroid 4. GERD on Protonix 5. Anxiety depression on Remeron  and Zyprexa.  Social work looking into a [facility] facility 6. Essential hypertension on metoprolol 7. Chronic hypoxic respiratory failure on oxygen  Code Status:     Code Status Orders  (From admission, onward)        Start     Ordered   08/01/17 1309  Do not attempt resuscitation (DNR)  Continuous    Question Answer Comment  In the event of cardiac or respiratory ARREST Do not call a "code blue"   In the event of cardiac or respiratory ARREST Do not perform Intubation, CPR, defibrillation or ACLS   In the event of cardiac or respiratory ARREST Use medication by any route, position, wound care, and other measures to relive pain and suffering. May use oxygen, suction and manual treatment of airway obstruction as needed for comfort.      08/01/17 1308    Code Status History    Date Active Date Inactive Code Status Order ID Comments User Context   11/06/2014 2158 11/15/2014 1127 Full Code [id number]  Gonzella Lex, MD Inpatient   11/01/2014 1841 11/05/2014 2135 Full Code [id number]  Gonzella Lex, MD Inpatient   10/29/2014 0225 11/01/2014 1841 Full Code [id number]  Lytle Butte, MD Inpatient   09/13/2014 0657 10/29/2014 0225 Full Code [id number]  Gonzella Lex, MD Inpatient   03/11/2014 1348 03/12/2014 0325 Full Code [id number]  Camprubi-Soms, Patty Sermons, PA-C ED     Disposition Plan: TBD  Antibiotics:  Doxycycline  Time spent: 27 minutes  [place]

## 2017-08-06 NOTE — Progress Notes (Signed)
Hasn't eaten anything so far today except 1/2 cup applesauce w/her medicines. She has had 3 Ensures 'tho. Appears very depressed.

## 2017-08-06 NOTE — Progress Notes (Signed)
Appears to be sleeping on rounds. resp easy. Call bell in reach. 

## 2017-08-06 NOTE — Progress Notes (Signed)
Appears to be sleeping on rounds. No resp distress. Call bell in reach.

## 2017-08-06 NOTE — Progress Notes (Signed)
Uneventful night. Pt has slept most of night. Flat affect and makes no requests of staff. Pt did eat some graham crackers and ensure earlier in shift. Bed alarm on while in bed, call bell in reach.

## 2017-08-07 LAB — CBC
HEMATOCRIT: 36.8 % (ref 35.0–47.0)
HEMOGLOBIN: 12.6 g/dL (ref 12.0–16.0)
MCH: 32.4 pg (ref 26.0–34.0)
MCHC: 34.4 g/dL (ref 32.0–36.0)
MCV: 94.2 fL (ref 80.0–100.0)
Platelets: 218 10*3/uL (ref 150–440)
RBC: 3.91 MIL/uL (ref 3.80–5.20)
RDW: 14.7 % — ABNORMAL HIGH (ref 11.5–14.5)
WBC: 7.2 10*3/uL (ref 3.6–11.0)

## 2017-08-07 LAB — CREATININE, SERUM
CREATININE: 0.67 mg/dL (ref 0.44–1.00)
GFR calc Af Amer: >60 mL/min (ref >60)
GFR calc non Af Amer: >60 mL/min (ref >60)

## 2017-08-07 MED ORDER — VITAMIN C 500 MG PO TABS
250.0000 mg | ORAL_TABLET | Freq: Two times a day (BID) | ORAL | Status: DC
  Administered 2017-08-07 – 2017-08-10 (×6): 250 mg via ORAL
  Filled 2017-08-07: qty 0.5
  Filled 2017-08-07: qty 1
  Filled 2017-08-07 (×4): qty 0.5
  Filled 2017-08-07: qty 1
  Filled 2017-08-07: qty 0.5

## 2017-08-07 MED ORDER — OLANZAPINE 7.5 MG PO TABS
7.5000 mg | ORAL_TABLET | Freq: Every day | ORAL | Status: DC
  Administered 2017-08-07 – 2017-08-09 (×3): 7.5 mg via ORAL
  Filled 2017-08-07 (×4): qty 1

## 2017-08-07 NOTE — Care Management Important Message (Signed)
Copy of signed IM left in patient's room.    

## 2017-08-07 NOTE — Progress Notes (Signed)
Initial Nutrition Assessment  DOCUMENTATION CODES:   Severe malnutrition in context of chronic illness, Underweight  INTERVENTION:   - Continue Ensure Enlive po TID, each supplement provides 350 kcal and 20 grams of protein  - Continue MVI daily  Add Vitamin C 258m po BID   Add Magic cup TID with meals, each supplement provides 290 kcal and 9 grams of protein  NUTRITION DIAGNOSIS:   Severe Malnutrition related to chronic illness(COPD) as evidenced by severe muscle depletion, severe fat depletion.  GOAL:   Patient will meet greater than or equal to 90% of their needs  -not met   MONITOR:   PO intake, Supplement acceptance, Weight trends, Labs   ASSESSMENT:   67year old female with PMH significant for COPD, depression, and anxiety. Pt presented to ED with SOB after pt's son called EMS because pt had been refusing to take medications, eat, or see her primary care physician.   Pt continues to have poor appetite and oral intake; pt eating <50% of meals. Pt is however drinking 2-3 Ensure per day. Diet liberalized. Per chart, pt is weight stable since admit. RD will add Magic Cups to increase calorie and protein intake. Pt on several appetite stimulants. Will start vitamin C as pt is at high risk for scurvy given her poor oral intake and h/o smoking. Scurvy patients often have widespread ecchymosis, depression, and poor oral intake. RD will continue to monitor for GRegal   Diet Order:  Diet regular Room service appropriate? Yes; Fluid consistency: Thin  EDUCATION NEEDS:   No education needs have been identified at this time  Skin:  Skin Assessment: Reviewed RN Assessment  Last BM:  4/48  Height:   Ht Readings from Last 1 Encounters:  08/01/17 _0  (1.575 m)    Weight:   Wt Readings from Last 1 Encounters:  08/07/17 90 lb 8 oz (41.1 kg)    Ideal Body Weight:  50 kg  BMI:  Body mass index is 16.55 kg/m.  Estimated Nutritional Needs:   Kcal:  1300-1500 kcal/day    Protein:  60-70 grams/day   Fluid:  >1.0 L/day  CKoleen DistanceMS, RD, LDN Pager #- [phone] Hours Pager: [phone]

## 2017-08-07 NOTE — Progress Notes (Signed)
Patient ID: Melissa George, female   DOB: 01-11-1951, 67 y.o.   MRN: [id number]   [facility] PROGRESS NOTE  Melissa George VFI:[id number] DOB: 07/15/1950 DOA: 08/01/2017 PCP: Patient, No Pcp Per  HPI/Subjective: Patient states she does not feel too good.  Some shortness of breath.  Objective: Vitals:   08/07/17 0747 08/07/17 1113  BP:  108/69  Pulse:  89  Resp:  20  Temp:  98.4 F (36.9 C)  SpO2: 100% 100%    Filed Weights   08/05/17 0418 08/06/17 0455 08/07/17 0500  Weight: 40.6 kg (89 lb 8.1 oz) 40.9 kg (90 lb 2.7 oz) 41.1 kg (90 lb 8 oz)    ROS: Review of Systems  Constitutional: Negative for chills and fever.  Eyes: Negative for blurred vision.  Respiratory: Positive for shortness of breath. Negative for cough.   Cardiovascular: Negative for chest pain.  Gastrointestinal: Negative for abdominal pain, constipation, diarrhea, nausea and vomiting.  Genitourinary: Negative for dysuria.  Musculoskeletal: Negative for joint pain.  Neurological: Negative for dizziness and headaches.   Exam: Physical Exam  HENT:  Nose: No mucosal edema.  Mouth/Throat: No oropharyngeal exudate or posterior oropharyngeal edema.  Eyes: Pupils are equal, round, and reactive to light. Conjunctivae, EOM and lids are normal.  Neck: No JVD present. Carotid bruit is not present. No edema present. No thyroid mass and no thyromegaly present.  Cardiovascular: S1 normal and S2 normal. Exam reveals no gallop.  No murmur heard. Pulses:      Dorsalis pedis pulses are 2+ on the right side, and 2+ on the left side.  Respiratory: No respiratory distress. She has decreased breath sounds in the right lower field and the left lower field. She has no wheezes. She has no rhonchi. She has no rales.  GI: Soft. Bowel sounds are normal. There is no tenderness.  Musculoskeletal:       Right ankle: She exhibits no swelling.       Left ankle: She exhibits no swelling.  Lymphadenopathy:    She has no  cervical adenopathy.  Neurological: She is alert. No cranial nerve deficit.  Skin: Skin is warm. No rash noted. Nails show no clubbing.  Psychiatric: Her affect is blunt.      Data Reviewed: Basic Metabolic Panel: Recent Labs  Lab 08/01/17 1037 08/01/17 1504 08/02/17 0538 08/07/17 0457  NA 141  --  140  --   K 3.9  --  3.7  --   CL 102  --  104  --   CO2 29  --  30  --   GLUCOSE 110*  --  98  --   BUN 26*  --  36*  --   CREATININE 0.63  --  0.72 0.67  CALCIUM 9.6  --  8.7*  --   MG  --  2.0  --   --    Liver Function Tests: Recent Labs  Lab 08/01/17 1037  AST 38  ALT 27  ALKPHOS 86  BILITOT 0.7  PROT 7.4  ALBUMIN 4.5   CBC: Recent Labs  Lab 08/01/17 1037 08/02/17 0538 08/07/17 0457  WBC 9.1 5.6 7.2  NEUTROABS 6.5  --   --   HGB 13.2 11.1* 12.6  HCT 39.5 32.7* 36.8  MCV 92.6 93.1 94.2  PLT 281 194 218   Cardiac Enzymes: Recent Labs  Lab 08/01/17 1037  TROPONINI <0.03    Scheduled Meds: . aspirin  81 mg Oral Daily  . atorvastatin  20  mg Oral q1800  . calcium-vitamin D  1 tablet Oral Daily  . cholecalciferol  400 Units Oral Q breakfast  . docusate sodium  200 mg Oral BID  . dronabinol  5 mg Oral BID AC  . enoxaparin  30 mg Subcutaneous Q24H  . feeding supplement (ENSURE ENLIVE)  237 mL Oral TID BM  . ipratropium-albuterol  3 mL Nebulization TID  . levothyroxine  50 mcg Oral BH-q7a  . metoprolol tartrate  12.5 mg Oral BID  . mirtazapine  45 mg Oral QHS  . multivitamin with minerals  1 tablet Oral Daily  . OLANZapine  5 mg Oral QHS  . pantoprazole  40 mg Oral Daily  . predniSONE  10 mg Oral Q breakfast  . senna  2 tablet Oral QHS  . tiotropium  18 mcg Inhalation Daily   Continuous Infusions:  Assessment/Plan:  1. COPD exacerbation.  Last dose of prednisone today.  On doxycycline also. 2. Failure to thrive.  Start Marinol.  Continue Ensure and Remeron.  Advised that she must eat or she will not survive. 3. Hypothyroidism unspecified on  Synthroid 4. GERD on Protonix 5. Anxiety depression on Remeron and Zyprexa.  Social work looking into a [facility] facility 6. Essential hypertension on metoprolol 7. Chronic hypoxic respiratory failure on oxygen  Code Status:     Code Status Orders  (From admission, onward)        Start     Ordered   08/01/17 1309  Do not attempt resuscitation (DNR)  Continuous    Question Answer Comment  In the event of cardiac or respiratory ARREST Do not call a "code blue"   In the event of cardiac or respiratory ARREST Do not perform Intubation, CPR, defibrillation or ACLS   In the event of cardiac or respiratory ARREST Use medication by any route, position, wound care, and other measures to relive pain and suffering. May use oxygen, suction and manual treatment of airway obstruction as needed for comfort.      08/01/17 1308    Code Status History    Date Active Date Inactive Code Status Order ID Comments User Context   11/06/2014 2158 11/15/2014 1127 Full Code [id number]  Gonzella Lex, MD Inpatient   11/01/2014 1841 11/05/2014 2135 Full Code [id number]  Gonzella Lex, MD Inpatient   10/29/2014 0225 11/01/2014 1841 Full Code [id number]  Lytle Butte, MD Inpatient   09/13/2014 0657 10/29/2014 0225 Full Code [id number]  Gonzella Lex, MD Inpatient   03/11/2014 1348 03/12/2014 0325 Full Code [id number]  Camprubi-Soms, Patty Sermons, PA-C ED     Disposition Plan:  discharge to [facility] psych facility once bed available  Antibiotics:  Doxycycline  Time spent: 30 minutes  [place]

## 2017-08-07 NOTE — Progress Notes (Signed)
Patient had a stronger voice but looked more frail than the last visit. Patient answers direct questions, but does not offer explanations about her current health, or engage in conversation about her family or support system. Patient did not remember details from the Chaplain's previous visit. The only time that the patient was engaged and somewhat attentive was during prayer. After prayer, the patient did not let go of the Chaplain's hand. Patient continues to be lonely. Chaplain offered to return, if requested.
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
## 2017-08-08 MED ORDER — METOPROLOL SUCCINATE ER 25 MG PO TB24
12.5000 mg | ORAL_TABLET | Freq: Every day | ORAL | Status: DC
Start: 2017-08-08 — End: 2017-08-10
  Administered 2017-08-08 – 2017-08-09 (×2): 12.5 mg via ORAL
  Filled 2017-08-08 (×2): qty 1

## 2017-08-08 NOTE — Clinical Social Work Note (Signed)
Clinical Social Work Assessment  Patient Details  Name: Melissa George MRN: [id number] Date of Birth: 08-08-1950  Date of referral:  08/08/17               Reason for consult:  Mental Health Concerns                Permission sought to share information with:    Permission granted to share information::     Name::        Agency::     Relationship::     Contact Information:     Housing/Transportation Living arrangements for the past 2 months:  (Has been living in [place] and sons report that patient has been kicked out of every facility she is placed in due to noncompliance) Source of Information:  Other (Comment Required), Psychiatric Consultation(previous CSW brief documentation on this hospitalization) Patient Interpreter Needed:  None Criminal Activity/Legal Involvement Pertinent to Current Situation/Hospitalization:  No - Comment as needed Significant Relationships:  Adult Children Lives with:  Self Do you feel safe going back to the place where you live?    Need for family participation in patient care:  Yes (Comment)  Care giving concerns:  Patient resides alone.   Social Worker assessment / plan:  CSW informed by RN CM (who was informed by the covering CSW from yesterday) that patient was living [place] with one of her sons and that it was decided by the son: [name], who lives in [place], that he would bring her back here to her home. [name] has informed staff that he does not want to take care of his mother and that requested the state pursue guardianship.   This CSW began following patient today and contacted the following geri-psych facilities:  [facility]: currently has no beds [facility]: Denied due to too many medical co-morbidities [facility]: Stated did not receive referral so CSW re-faxed referral [facility]: Stated did not receive referral so CSW re-faxed [facility]: do not take patients with oxygen [facility]: Do not take patients with oxygen  Employment  status:  Disabled (Comment on whether or not currently receiving Disability) Insurance information:  Medicare PT Recommendations:  Home with [place] / Referral to community resources:     Patient/Family's Response to care: CSW spoke with patient and she verbalized understanding of CSW looking into geri psych facilities.  Patient/Family's Understanding of and Emotional Response to Diagnosis, Current Treatment, and Prognosis:  Patient does not speak much but is in agreement to going to geripsych at this time.  Emotional Assessment Appearance:  Appears older than stated age Attitude/Demeanor/Rapport:  Other(pleasant and cooperative) Affect (typically observed):  Withdrawn, Calm, Flat Orientation:  Oriented to Self, Oriented to Place, Oriented to  Time, Oriented to Situation Alcohol / Substance use:  Not Applicable Psych involvement (Current and /or in the community):  Yes (Comment)  Discharge Needs  Concerns to be addressed:  Mental Health Concerns Readmission within the last 30 days:  No Current discharge risk:  None Barriers to Discharge:  Psych Bed not available   Shela Leff, LCSW 08/08/2017, 2:59 PM

## 2017-08-08 NOTE — Progress Notes (Signed)
Patient ID: Melissa George, female   DOB: January 17, 1951, 67 y.o.   MRN: [id number]   [facility] PROGRESS NOTE  Melissa George:[id number] DOB: 01/29/1951 DOA: 08/01/2017 PCP: Patient, No Pcp Per  HPI/Subjective: Patient complains of left hip pain going on since her left hip surgery.  Patient also complains of a little shortness of breath.  Does not feel well.  Ate a little bacon for breakfast.  Objective: Vitals:   08/08/17 0752 08/08/17 1207  BP:  107/62  Pulse:  (!) 104  Resp:    Temp:  97.9 F (36.6 C)  SpO2: 97% 99%    Filed Weights   08/06/17 0455 08/07/17 0500 08/08/17 0500  Weight: 40.9 kg (90 lb 2.7 oz) 41.1 kg (90 lb 8 oz) 42.1 kg (92 lb 13 oz)    ROS: Review of Systems  Constitutional: Negative for chills and fever.  Eyes: Negative for blurred vision.  Respiratory: Positive for shortness of breath. Negative for cough.   Cardiovascular: Negative for chest pain.  Gastrointestinal: Negative for abdominal pain, constipation, diarrhea, nausea and vomiting.  Genitourinary: Negative for dysuria.  Musculoskeletal: Positive for joint pain.  Neurological: Negative for dizziness and headaches.   Exam: Physical Exam  HENT:  Nose: No mucosal edema.  Mouth/Throat: No oropharyngeal exudate or posterior oropharyngeal edema.  Eyes: Pupils are equal, round, and reactive to light. Conjunctivae, EOM and lids are normal.  Neck: No JVD present. Carotid bruit is not present. No edema present. No thyroid mass and no thyromegaly present.  Cardiovascular: S1 normal and S2 normal. Exam reveals no gallop.  No murmur heard. Pulses:      Dorsalis pedis pulses are 2+ on the right side, and 2+ on the left side.  Respiratory: No respiratory distress. She has decreased breath sounds in the right lower field and the left lower field. She has no wheezes. She has no rhonchi. She has no rales.  GI: Soft. Bowel sounds are normal. There is no tenderness.  Musculoskeletal:       Right  ankle: She exhibits no swelling.       Left ankle: She exhibits no swelling.  Lymphadenopathy:    She has no cervical adenopathy.  Neurological: She is alert. No cranial nerve deficit.  Skin: Skin is warm. No rash noted. Nails show no clubbing.  Psychiatric: Her affect is blunt.      Data Reviewed: Basic Metabolic Panel: Recent Labs  Lab 08/01/17 1504 08/02/17 0538 08/07/17 0457  NA  --  140  --   K  --  3.7  --   CL  --  104  --   CO2  --  30  --   GLUCOSE  --  98  --   BUN  --  36*  --   CREATININE  --  0.72 0.67  CALCIUM  --  8.7*  --   MG 2.0  --   --    CBC: Recent Labs  Lab 08/02/17 0538 08/07/17 0457  WBC 5.6 7.2  HGB 11.1* 12.6  HCT 32.7* 36.8  MCV 93.1 94.2  PLT 194 218    Scheduled Meds: . aspirin  81 mg Oral Daily  . atorvastatin  20 mg Oral q1800  . calcium-vitamin D  1 tablet Oral Daily  . cholecalciferol  400 Units Oral Q breakfast  . docusate sodium  200 mg Oral BID  . dronabinol  5 mg Oral BID AC  . enoxaparin  30 mg Subcutaneous Q24H  .  feeding supplement (ENSURE ENLIVE)  237 mL Oral TID BM  . ipratropium-albuterol  3 mL Nebulization TID  . levothyroxine  50 mcg Oral BH-q7a  . metoprolol succinate  12.5 mg Oral QHS  . mirtazapine  45 mg Oral QHS  . multivitamin with minerals  1 tablet Oral Daily  . OLANZapine  7.5 mg Oral QHS  . pantoprazole  40 mg Oral Daily  . senna  2 tablet Oral QHS  . tiotropium  18 mcg Inhalation Daily  . vitamin C  250 mg Oral BID    Assessment/Plan:  1. Failure to thrive.  Start Marinol.  Continue Ensure and Remeron.  Advised that she must eat or she will not survive. 2. COPD exacerbation treated.  Continue nebulizers and Spiriva. 3. Hypothyroidism unspecified on Synthroid 4. GERD on Protonix 5. Anxiety depression on Remeron and Zyprexa.  Social work looking into a [facility] facility 6. Essential hypertension on metoprolol 7. Chronic hypoxic respiratory failure on oxygen  Code Status:     Code Status  Orders  (From admission, onward)        Start     Ordered   08/01/17 1309  Do not attempt resuscitation (DNR)  Continuous    Question Answer Comment  In the event of cardiac or respiratory ARREST Do not call a "code blue"   In the event of cardiac or respiratory ARREST Do not perform Intubation, CPR, defibrillation or ACLS   In the event of cardiac or respiratory ARREST Use medication by any route, position, wound care, and other measures to relive pain and suffering. May use oxygen, suction and manual treatment of airway obstruction as needed for comfort.      08/01/17 1308    Code Status History    Date Active Date Inactive Code Status Order ID Comments User Context   11/06/2014 2158 11/15/2014 1127 Full Code [id number]  Gonzella Lex, MD Inpatient   11/01/2014 1841 11/05/2014 2135 Full Code [id number]  Gonzella Lex, MD Inpatient   10/29/2014 0225 11/01/2014 1841 Full Code [id number]  Lytle Butte, MD Inpatient   09/13/2014 0657 10/29/2014 0225 Full Code [id number]  Gonzella Lex, MD Inpatient   03/11/2014 1348 03/12/2014 0325 Full Code [id number]  Camprubi-Soms, Patty Sermons, PA-C ED     Disposition Plan:  discharge to [facility] psych facility once bed available  Time spent: 25 minutes.  left message for son on the phone.   Melissa George

## 2017-08-08 NOTE — Clinical Social Work Note (Signed)
CSW received phone call from [facility], they are reviewing patient today, and will call CSW back with decision.  Awaiting from other Geropsych facilities as well.  [name]. [place], MSW, [place]  08/08/2017 8:31 AM

## 2017-08-08 NOTE — Clinical Social Work Note (Addendum)
CSW faxed referral to following Geripsych hospitals:  [place]  CSW is awaiting responses from geripsych facilities.  [name]. [place], MSW, [place]  08/08/2017 5:06 PM
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
## 2017-08-09 NOTE — Clinical Social Work Note (Signed)
Ced[facility] at [facility] called CSW and they are able to take patient. Their admit order for patient is good for 24 hours. CSW spoke with patient and patient refused to go voluntarily. Patient stated that she knows that "everyone thinks I am crazy." She stated she will not go to get ECT treatments. CSW explained to patient that her medicare is what will cover her psychiatric hospitalization and that she is not being sent to get ECT treatments but that she is being sent to get further treatment for her depression in order to regulate her medications. Patient stated she will not go to [facility] advised that if she does not want to go that CSW would relay this to the psychiatrist. CSW spoke with psychiatry who stated he will be involuntarily committed. CSW has updated patient's son who feels overwhelmed and stated that this is happening quickly. CSW provided supportive counseling surrounding this transition of care and stated that CSW would attempt to assist patient's son if possible with guidance for further planning. CSW will be sending a list of assisted living/group homes to him for review. Patient's son understands that he will need to assist patient with applying for medicaid and then once patient has medicaid she will be eligible to go to an assisted living or group home with a medicaid bed available.   CSW has coordinated with [facility] to have patient transport tomorrow at 10am. EMS will be coordinating with the sheriff's department on getting patient with her oxygen to [place] MSW,LCSW [phone]

## 2017-08-09 NOTE — Progress Notes (Signed)
Physical Therapy Treatment Patient Details Name: Melissa George MRN: [id number] DOB: 23-Jul-1950 Today's Date: 08/09/2017    History of Present Illness Pt admitted for depression with COPD exacerbation. Pt with complaints of refusing to eat/take meds along with SOB symptoms. History includes anxiety and COPD on 2L of O2 at baseline. Psych recommending geri psych at discharge.    PT Comments    Pt presents with deficits in strength, gait, balance, and activity tolerance but is progressing towards goals.  Pt reported having L hip surgery in [place] around 4 weeks ago and stated had posterior hip precautions.  Posterior hip precautions reviewed during functional mobility for compliance.  Pt tolerated therex and ambulation well with pt reporting RPE after amb 80' of around 4-5/10.  Pt's SpO2 and HR WNL during session on 2LO2/min.   Pt will benefit from HHPT services upon discharge to safely address above deficits for decreased caregiver assistance and eventual return to PLOF.     Follow Up Recommendations  Home health PT;Supervision for mobility/OOB     Equipment Recommendations       Recommendations for Other Services       Precautions / Restrictions Precautions Precautions: Fall;Posterior Hip Precaution Comments: Pt had L THA with posterior approach per pt around 4 wks ago in [place], not being followed here, Dr. Earleen Newport notified Restrictions Weight Bearing Restrictions: No    Mobility  Bed Mobility Overal bed mobility: Independent             General bed mobility comments: safe technique without assist required  Transfers   Equipment used: Rolling walker (2 wheeled)             General transfer comment: Min verbal cues for sequencing to maintain hip precautions  Ambulation/Gait Ambulation/Gait assistance: Supervision Ambulation Distance (Feet): 80 Feet x 2 Assistive device: Rolling walker (2 wheeled) Gait Pattern/deviations: Step-through pattern;Decreased step  length - right;Decreased step length - left Gait velocity: Decreased   General Gait Details: Slow cadence with amb but steady without LOB, no increased L hip pain with ambulation   Stairs             Wheelchair Mobility    Modified Rankin (Stroke Patients Only)       Balance Overall balance assessment: No apparent balance deficits (not formally assessed)                                          Cognition Arousal/Alertness: Awake/alert Behavior During Therapy: Flat affect Overall Cognitive Status: Within Functional Limits for tasks assessed                                        Exercises Total Joint Exercises Ankle Circles/Pumps: Strengthening;Both;10 reps Quad Sets: Strengthening;Both;10 reps Gluteal Sets: Strengthening;Both;10 reps Straight Leg Raises: AROM;Both;5 reps Long Arc Quad: AROM;Both;10 reps Other Exercises Other Exercises: Posterior hip precaution education provided Other Exercises: Pt education provided on dyspnea spiral, physiological benefits of activity, energy conservation, and RPE goals with activity    General Comments        Pertinent Vitals/Pain Pain Assessment: 0-10 Pain Score: 4  Pain Location: L hip Pain Descriptors / Indicators: Sore Pain Intervention(s): Monitored during session    Home Living  Prior Function            PT Goals (current goals can now be found in the care plan section) Progress towards PT goals: Progressing toward goals    Frequency    Min 2X/week      PT Plan Current plan remains appropriate    Co-evaluation              AM-PAC PT "6 Clicks" Daily Activity  Outcome Measure                   End of Session Equipment Utilized During Treatment: Gait belt;Oxygen Activity Tolerance: Patient tolerated treatment well Patient left: in bed;with call bell/phone within reach;with bed alarm set Nurse Communication: Mobility  status PT Visit Diagnosis: Muscle weakness (generalized) (M62.81);Difficulty in walking, not elsewhere classified (R26.2)     Time: 6389-3734 PT Time Calculation (min) (ACUTE ONLY): 26 min  Charges:  $Therapeutic Exercise: 8-22 mins $Therapeutic Activity: 8-22 mins                    G Codes:       D. Royetta Asal PT, DPT 08/09/17, 11:10 AM

## 2017-08-09 NOTE — Progress Notes (Signed)
Patient ID: DANITA PROUD, female   DOB: 1950-06-29, 67 y.o.   MRN: [id number]   [facility] PROGRESS NOTE  DARRIONA DEHAAS UUV:[id number] DOB: May 24, 1950 DOA: 08/01/2017 PCP: Patient, No Pcp Per  HPI/Subjective: Patient states that she does not feel good.  Objective: Vitals:   08/09/17 1058 08/09/17 1242  BP:  97/60  Pulse: 99 98  Resp:  17  Temp:  97.6 F (36.4 C)  SpO2: 97% 99%    Filed Weights   08/06/17 0455 08/07/17 0500 08/08/17 0500  Weight: 40.9 kg (90 lb 2.7 oz) 41.1 kg (90 lb 8 oz) 42.1 kg (92 lb 13 oz)    ROS: Review of Systems  Constitutional: Negative for chills and fever.  Eyes: Negative for blurred vision.  Respiratory: Positive for shortness of breath. Negative for cough.   Cardiovascular: Negative for chest pain.  Gastrointestinal: Negative for abdominal pain, constipation, diarrhea, nausea and vomiting.  Genitourinary: Negative for dysuria.  Musculoskeletal: Positive for joint pain.  Neurological: Negative for dizziness and headaches.   Exam: Physical Exam  HENT:  Nose: No mucosal edema.  Mouth/Throat: No oropharyngeal exudate or posterior oropharyngeal edema.  Eyes: Pupils are equal, round, and reactive to light. Conjunctivae, EOM and lids are normal.  Neck: No JVD present. Carotid bruit is not present. No edema present. No thyroid mass and no thyromegaly present.  Cardiovascular: S1 normal and S2 normal. Exam reveals no gallop.  No murmur heard. Pulses:      Dorsalis pedis pulses are 2+ on the right side, and 2+ on the left side.  Respiratory: No respiratory distress. She has decreased breath sounds in the right lower field and the left lower field. She has no wheezes. She has no rhonchi. She has no rales.  GI: Soft. Bowel sounds are normal. There is no tenderness.  Musculoskeletal:       Right ankle: She exhibits no swelling.       Left ankle: She exhibits no swelling.  Lymphadenopathy:    She has no cervical adenopathy.   Neurological: She is alert. No cranial nerve deficit.  Skin: Skin is warm. No rash noted. Nails show no clubbing.  Psychiatric: Her affect is blunt.      Data Reviewed: Basic Metabolic Panel: Recent Labs  Lab 08/07/17 0457  CREATININE 0.67   CBC: Recent Labs  Lab 08/07/17 0457  WBC 7.2  HGB 12.6  HCT 36.8  MCV 94.2  PLT 218    Scheduled Meds: . aspirin  81 mg Oral Daily  . atorvastatin  20 mg Oral q1800  . calcium-vitamin D  1 tablet Oral Daily  . cholecalciferol  400 Units Oral Q breakfast  . docusate sodium  200 mg Oral BID  . dronabinol  5 mg Oral BID AC  . enoxaparin  30 mg Subcutaneous Q24H  . feeding supplement (ENSURE ENLIVE)  237 mL Oral TID BM  . ipratropium-albuterol  3 mL Nebulization TID  . levothyroxine  50 mcg Oral BH-q7a  . metoprolol succinate  12.5 mg Oral QHS  . mirtazapine  45 mg Oral QHS  . multivitamin with minerals  1 tablet Oral Daily  . OLANZapine  7.5 mg Oral QHS  . pantoprazole  40 mg Oral Daily  . senna  2 tablet Oral QHS  . tiotropium  18 mcg Inhalation Daily  . vitamin C  250 mg Oral BID    Assessment/Plan:  1. Failure to thrive.  Continue Marinol.  Continue Ensure and Remeron.   2.  COPD exacerbation treated.  Continue nebulizers and Spiriva. 3. Hypothyroidism unspecified on Synthroid 4. GERD on Protonix 5. Anxiety depression on Remeron and Zyprexa.  Social work looking into a [facility] facility 6. Essential hypertension on metoprolol 7. Chronic hypoxic respiratory failure on oxygen  Code Status:     Code Status Orders  (From admission, onward)        Start     Ordered   08/01/17 1309  Do not attempt resuscitation (DNR)  Continuous    Question Answer Comment  In the event of cardiac or respiratory ARREST Do not call a "code blue"   In the event of cardiac or respiratory ARREST Do not perform Intubation, CPR, defibrillation or ACLS   In the event of cardiac or respiratory ARREST Use medication by any route, position,  wound care, and other measures to relive pain and suffering. May use oxygen, suction and manual treatment of airway obstruction as needed for comfort.      08/01/17 1308    Code Status History    Date Active Date Inactive Code Status Order ID Comments User Context   11/06/2014 2158 11/15/2014 1127 Full Code [id number]  Gonzella Lex, MD Inpatient   11/01/2014 1841 11/05/2014 2135 Full Code [id number]  Gonzella Lex, MD Inpatient   10/29/2014 0225 11/01/2014 1841 Full Code [id number]  Lytle Butte, MD Inpatient   09/13/2014 0657 10/29/2014 0225 Full Code [id number]  Gonzella Lex, MD Inpatient   03/11/2014 1348 03/12/2014 0325 Full Code [id number]  Camprubi-Soms, Patty Sermons, PA-C ED     Disposition Plan:  discharge to [facility] psych facility once bed available  Time spent: 25 minutes now. Spoke with son yesterday.  Quame Spratlin [employer]

## 2017-08-09 NOTE — Care Management Important Message (Signed)
Copy of signed IM left in patient's room.    

## 2017-08-09 NOTE — Consult Note (Signed)
Psychiatry: Brief follow-up.  67 year old woman I have been following in the hospital with severe depression with psychotic-like features.  Not eating well.  Not able to take care of her basic needs.  Declining recommended care intermittently.  I have recommended geriatric psychiatry hospitalization.  Social work informs me that a bed is available at [facility].  Patient was refusing to go.  It is clear that she has no safe place to stay and no ability to take care of herself in that environment and her sons have made it clear that they will not be taking care of her.  Patient not able to make reasonable decisions for herself.  I have filed commitment papers based on this lack of self-care.  Patient should be transferred to a geriatric psychiatry facility for further treatment.  Social work aware.

## 2017-08-09 NOTE — Clinical Social Work Note (Signed)
Patient's son, Melissa George, [phone] contacted CSW and discussed the frustrating road he has been on with trying to care for his mother. Patient was in [place] but has a home here. Patient was in and out of psych hospitals in [place] and was last at a SNF in [place] but she could not pay for long term so they discharged her and patient's son, Melissa George brought his mom back down to [place] because he had no other options. He stated that she did fine the first day but then began her downward cycle with her depression quickly. CSW provided supportive counseling and listening for [place]. CSW continuing to try and find geri psych bed. Currently no bed offers. Shela Leff MSW,LCSW [phone]

## 2017-08-10 MED ORDER — ALBUTEROL SULFATE (2.5 MG/3ML) 0.083% IN NEBU: 2.5000 mg | INHALATION_SOLUTION | Freq: Four times a day (QID) | RESPIRATORY_TRACT | 0 refills | Status: DC | PRN

## 2017-08-10 MED ORDER — OLANZAPINE 7.5 MG PO TABS: 7.5000 mg | ORAL_TABLET | Freq: Every day | ORAL | 0 refills | Status: DC

## 2017-08-10 MED ORDER — DRONABINOL 5 MG PO CAPS: 5.0000 mg | ORAL_CAPSULE | Freq: Two times a day (BID) | ORAL | 0 refills | Status: DC

## 2017-08-10 MED ORDER — ASCORBIC ACID 250 MG PO TABS: 250.0000 mg | ORAL_TABLET | Freq: Two times a day (BID) | ORAL | 0 refills | Status: DC

## 2017-08-10 MED ORDER — METOPROLOL SUCCINATE ER 25 MG PO TB24: 12.5000 mg | ORAL_TABLET | Freq: Every day | ORAL | 0 refills | Status: DC

## 2017-08-10 MED ORDER — ACETAMINOPHEN 325 MG PO TABS: 650.0000 mg | ORAL_TABLET | Freq: Four times a day (QID) | ORAL | Status: AC | PRN

## 2017-08-10 MED ORDER — OXYCODONE HCL 5 MG PO TABS: 5.0000 mg | ORAL_TABLET | Freq: Two times a day (BID) | ORAL | 0 refills | Status: DC | PRN

## 2017-08-10 MED ORDER — POLYETHYLENE GLYCOL 3350 17 G PO PACK: 17.0000 g | PACK | Freq: Every day | ORAL | 0 refills | Status: DC | PRN

## 2017-08-10 NOTE — Progress Notes (Signed)
Melissa George  A and O x 4. VSS. Pt tolerating diet well, with little appetite. No complaints of pain or nausea. IV removed intact, prescriptions given. Report called to diane RN at facility. Pt discharged EMS with sheriff due to IVC.   Lynann Bologna MSN, RN-BC  Allergies as of 08/10/2017      Reactions   Ciprofloxacin Shortness Of Breath, Itching   Levofloxacin Other (See Comments)   Reaction:  Unknown    Morphine And Related Nausea And Vomiting   Sulfa Antibiotics Hives, Itching   Symbicort [budesonide-formoterol Fumarate] Other (See Comments)   Reaction:  Psychotic episode   Advair Diskus [fluticasone-salmeterol] Anxiety      Medication List    STOP taking these medications   ALPRAZolam 0.25 MG tablet Commonly known as:  XANAX   buPROPion 150 MG 12 hr tablet Commonly known as:  WELLBUTRIN SR   cloZAPine 25 MG tablet Commonly known as:  CLOZARIL   enoxaparin 40 MG/0.4ML injection Commonly known as:  LOVENOX   fluvoxaMINE 50 MG tablet Commonly known as:  LUVOX   megestrol 400 MG/10ML suspension Commonly known as:  MEGACE   metoprolol tartrate 25 MG tablet Commonly known as:  LOPRESSOR   nitroGLYCERIN 0.4 MG SL tablet Commonly known as:  NITROSTAT   pantoprazole 40 MG tablet Commonly known as:  PROTONIX   senna 8.6 MG Tabs tablet Commonly known as:  SENOKOT     TAKE these medications   acetaminophen 325 MG tablet Commonly known as:  TYLENOL Take 2 tablets (650 mg total) by mouth every 6 (six) hours as needed for mild pain (or Fever >/= 101).   albuterol 108 (90 Base) MCG/ACT inhaler Commonly known as:  PROVENTIL HFA;VENTOLIN HFA Inhale 2 puffs into the lungs every 6 (six) hours as needed for wheezing or shortness of breath. What changed:  Another medication with the same name was added. Make sure you understand how and when to take each.   albuterol (2.5 MG/3ML) 0.083% nebulizer solution Commonly known as:  PROVENTIL Take 3 mLs (2.5 mg total) by  nebulization every 6 (six) hours as needed for wheezing or shortness of breath. What changed:  You were already taking a medication with the same name, and this prescription was added. Make sure you understand how and when to take each.   ascorbic acid 250 MG tablet Commonly known as:  VITAMIN C Take 1 tablet (250 mg total) by mouth 2 (two) times daily.   aspirin 81 MG chewable tablet Chew 1 tablet (81 mg total) by mouth before cath procedure.   atorvastatin 20 MG tablet Commonly known as:  LIPITOR Take 1 tablet (20 mg total) by mouth daily at 6 PM.   CALCIUM 600-D 600-400 MG-UNIT Tabs Generic drug:  Calcium Carbonate-Vitamin D3 Take 1 tablet by mouth daily.   cholecalciferol 400 UNIT/ML Liqd Commonly known as:  D-VI-SOL Take 1 mL (400 Units total) by mouth daily with breakfast.   docusate sodium 100 MG capsule Commonly known as:  COLACE Take 2 capsules (200 mg total) by mouth 2 (two) times daily.   dronabinol 5 MG capsule Commonly known as:  MARINOL Take 1 capsule (5 mg total) by mouth 2 (two) times daily before lunch and supper.   feeding supplement (ENSURE ENLIVE) Liqd Take 237 mLs by mouth 3 (three) times daily with meals.   levothyroxine 50 MCG tablet Commonly known as:  SYNTHROID, LEVOTHROID Take 50 mcg by mouth every morning.   metoprolol succinate 25 MG 24  hr tablet Commonly known as:  TOPROL-XL Take 0.5 tablets (12.5 mg total) by mouth at bedtime.   mirtazapine 45 MG tablet Commonly known as:  REMERON Take 1 tablet (45 mg total) by mouth at bedtime.   multivitamin with minerals Tabs tablet Take 1 tablet by mouth daily.   OLANZapine 7.5 MG tablet Commonly known as:  ZYPREXA Take 1 tablet (7.5 mg total) by mouth at bedtime.   oxyCODONE 5 MG immediate release tablet Commonly known as:  Oxy IR/ROXICODONE Take 1 tablet (5 mg total) by mouth every 12 (twelve) hours as needed for severe pain.   polyethylene glycol packet Commonly known as:  MIRALAX /  GLYCOLAX Take 17 g by mouth daily as needed for mild constipation.   tiotropium 18 MCG inhalation capsule Commonly known as:  SPIRIVA Place 1 capsule (18 mcg total) into inhaler and inhale daily.       Vitals:   08/10/17 0759 08/10/17 0944  BP:  108/62  Pulse:  96  Resp:  16  Temp:  98.4 F (36.9 C)  SpO2: 97% 98%

## 2017-08-10 NOTE — Discharge Summary (Signed)
[facility] [place] NAME: Melissa George    MR#:  [id number]  DATE OF BIRTH:  1950-07-14  DATE OF ADMISSION:  08/01/2017 ADMITTING PHYSICIAN: Bettey Costa, MD  DATE OF DISCHARGE: 08/10/2017  PRIMARY CARE PHYSICIAN: Patient, No Pcp Per    ADMISSION DIAGNOSIS:  COPD exacerbation ([place]) [J44.1]  DISCHARGE DIAGNOSIS:  Principal Problem:   Depression, major, recurrent, severe with psychosis ([place]) Active Problems:   COPD (chronic obstructive pulmonary disease) ([place])   SECONDARY DIAGNOSIS:   Past Medical History:  Diagnosis Date  . Anxiety   . Anxiety   . COPD (chronic obstructive pulmonary disease) ([place])   . 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  Failure to thrive, severe malnutrition.  Continue Marinol.  Continue Ensure Magic cup and Remeron.  Patient starting to eat a little bit better. 2.  COPD exacerbation treated.  Patient off antibiotics and steroids at this point.  Patient has an allergy to Advair and Dulera inhalers.  Continue as needed albuterol nebulizer and/or inhaler.  Continue Spiriva inhaler. 3.  Hypothyroidism unspecified on Synthroid 4.  Essential hypertension on low-dose Toprol at night. 5.  Chronic hypoxic respiratory failure on chronic 2 L of oxygen. 6.  Recent hip surgery.  Continue physical therapy.  Patient walks with a walker.  PRN pain medication but be careful with this.  Rather use Tylenol. 7.  Severe depression with psychotic-like features.  Patient to be transferred to [facility] psych facility today.  Patient involuntary committed in order to proceed with transfer.  DISCHARGE CONDITIONS:   Satisfactory  CONSULTS OBTAINED:  Treatment Team:  Clapacs, Madie Reno, MD  DRUG ALLERGIES:   Allergies  Allergen Reactions  . Ciprofloxacin Shortness Of Breath and Itching  . Levofloxacin Other (See Comments)    Reaction:  Unknown   . Morphine And Related Nausea And Vomiting  . Sulfa  Antibiotics Hives and Itching  . Symbicort [Budesonide-Formoterol Fumarate] Other (See Comments)    Reaction:  Psychotic episode  . Advair Diskus [Fluticasone-Salmeterol] Anxiety    DISCHARGE MEDICATIONS:   Allergies as of 08/10/2017      Reactions   Ciprofloxacin Shortness Of Breath, Itching   Levofloxacin Other (See Comments)   Reaction:  Unknown    Morphine And Related Nausea And Vomiting   Sulfa Antibiotics Hives, Itching   Symbicort [budesonide-formoterol Fumarate] Other (See Comments)   Reaction:  Psychotic episode   Advair Diskus [fluticasone-salmeterol] Anxiety      Medication List    STOP taking these medications   ALPRAZolam 0.25 MG tablet Commonly known as:  XANAX   buPROPion 150 MG 12 hr tablet Commonly known as:  WELLBUTRIN SR   cloZAPine 25 MG tablet Commonly known as:  CLOZARIL   enoxaparin 40 MG/0.4ML injection Commonly known as:  LOVENOX   fluvoxaMINE 50 MG tablet Commonly known as:  LUVOX   megestrol 400 MG/10ML suspension Commonly known as:  MEGACE   metoprolol tartrate 25 MG tablet Commonly known as:  LOPRESSOR   nitroGLYCERIN 0.4 MG SL tablet Commonly known as:  NITROSTAT   pantoprazole 40 MG tablet Commonly known as:  PROTONIX   senna 8.6 MG Tabs tablet Commonly known as:  SENOKOT     TAKE these medications   acetaminophen 325 MG tablet Commonly known as:  TYLENOL Take 2 tablets (650 mg total) by mouth every 6 (six) hours as needed for mild pain (or Fever >/= 101).  albuterol 108 (90 Base) MCG/ACT inhaler Commonly known as:  PROVENTIL HFA;VENTOLIN HFA Inhale 2 puffs into the lungs every 6 (six) hours as needed for wheezing or shortness of breath. What changed:  Another medication with the same name was added. Make sure you understand how and when to take each.   albuterol (2.5 MG/3ML) 0.083% nebulizer solution Commonly known as:  PROVENTIL Take 3 mLs (2.5 mg total) by nebulization every 6 (six) hours as needed for wheezing or  shortness of breath. What changed:  You were already taking a medication with the same name, and this prescription was added. Make sure you understand how and when to take each.   ascorbic acid 250 MG tablet Commonly known as:  VITAMIN C Take 1 tablet (250 mg total) by mouth 2 (two) times daily.   aspirin 81 MG chewable tablet Chew 1 tablet (81 mg total) by mouth before cath procedure.   atorvastatin 20 MG tablet Commonly known as:  LIPITOR Take 1 tablet (20 mg total) by mouth daily at 6 PM.   CALCIUM 600-D 600-400 MG-UNIT Tabs Generic drug:  Calcium Carbonate-Vitamin D3 Take 1 tablet by mouth daily.   cholecalciferol 400 UNIT/ML Liqd Commonly known as:  D-VI-SOL Take 1 mL (400 Units total) by mouth daily with breakfast.   docusate sodium 100 MG capsule Commonly known as:  COLACE Take 2 capsules (200 mg total) by mouth 2 (two) times daily.   dronabinol 5 MG capsule Commonly known as:  MARINOL Take 1 capsule (5 mg total) by mouth 2 (two) times daily before lunch and supper.   feeding supplement (ENSURE ENLIVE) Liqd Take 237 mLs by mouth 3 (three) times daily with meals.   levothyroxine 50 MCG tablet Commonly known as:  SYNTHROID, LEVOTHROID Take 50 mcg by mouth every morning.   metoprolol succinate 25 MG 24 hr tablet Commonly known as:  TOPROL-XL Take 0.5 tablets (12.5 mg total) by mouth at bedtime.   mirtazapine 45 MG tablet Commonly known as:  REMERON Take 1 tablet (45 mg total) by mouth at bedtime.   multivitamin with minerals Tabs tablet Take 1 tablet by mouth daily.   OLANZapine 7.5 MG tablet Commonly known as:  ZYPREXA Take 1 tablet (7.5 mg total) by mouth at bedtime.   oxyCODONE 5 MG immediate release tablet Commonly known as:  Oxy IR/ROXICODONE Take 1 tablet (5 mg total) by mouth every 12 (twelve) hours as needed for severe pain.   polyethylene glycol packet Commonly known as:  MIRALAX / GLYCOLAX Take 17 g by mouth daily as needed for mild  constipation.   tiotropium 18 MCG inhalation capsule Commonly known as:  SPIRIVA Place 1 capsule (18 mcg total) into inhaler and inhale daily.        DISCHARGE INSTRUCTIONS:   follow-up with [facility] team for continued care.  If you experience worsening of your admission symptoms, develop shortness of breath, life threatening emergency, suicidal or homicidal thoughts you must seek medical attention immediately by calling 911 or calling your MD immediately  if symptoms less severe.  You Must read complete instructions/literature along with all the possible adverse reactions/side effects for all the Medicines you take and that have been prescribed to you. Take any new Medicines after you have completely understood and accept all the possible adverse reactions/side effects.   Please note  You were cared for by a hospitalist during your hospital stay. If you have any questions about your discharge medications or the care you received while you  were in the hospital after you are discharged, you can call the unit and asked to speak with the hospitalist on call if the hospitalist that took care of you is not available. Once you are discharged, your primary care physician will handle any further medical issues. Please note that NO REFILLS for any discharge medications will be authorized once you are discharged, as it is imperative that you return to your primary care physician (or establish a relationship with a primary care physician if you do not have one) for your aftercare needs so that they can reassess your need for medications and monitor your lab values.    Today   CHIEF COMPLAINT:   Chief Complaint  Patient presents with  . FFT-refusing food/meds/doc appts    HISTORY OF PRESENT ILLNESS:  Gusta Marksberry  is a 67 y.o. female with a known history of depression presents with failure to thrive and COPD exacerbation.   VITAL SIGNS:  Blood pressure 107/71, pulse 76, temperature  97.9 F (36.6 C), temperature source Oral, resp. rate 18, height 5\' 2"  (1.575 m), weight 43 kg (94 lb 12.8 oz), SpO2 97 %.  Decreased  PHYSICAL EXAMINATION:  GENERAL:  67 y.o.-year-old patient lying in the bed with no acute distress.  EYES: Pupils equal, round, reactive to light and accommodation. No scleral icterus. Extraocular muscles intact.  HEENT: Head atraumatic, normocephalic. Oropharynx and nasopharynx clear.  NECK:  Supple, no jugular venous distention. No thyroid enlargement, no tenderness.  LUNGS:  breath sounds bilaterally, no wheezing, rales,rhonchi or crepitation. No use of accessory muscles of respiration.  CARDIOVASCULAR: S1, S2 normal. No murmurs, rubs, or gallops.  ABDOMEN: Soft, non-tender, non-distended. Bowel sounds present. No organomegaly or mass.  EXTREMITIES: No pedal edema, cyanosis, or clubbing.  NEUROLOGIC: Cranial nerves II through XII are intact. Muscle strength 5/5 in all extremities. Sensation intact. Gait not checked.  PSYCHIATRIC: The patient is alert and answers questions.Marland Kitchen  SKIN: No obvious rash, lesion, or ulcer.   DATA REVIEW:   CBC Recent Labs  Lab 08/07/17 0457  WBC 7.2  HGB 12.6  HCT 36.8  PLT 218    Chemistries  Recent Labs  Lab 08/07/17 0457  CREATININE 0.67     Management plans discussed with the patient.  the patient's son was notified of the patient's transfer for today by social work team yesterday.   CODE STATUS:     Code Status Orders  (From admission, onward)        Start     Ordered   08/01/17 1309  Do not attempt resuscitation (DNR)  Continuous    Question Answer Comment  In the event of cardiac or respiratory ARREST Do not call a "code blue"   In the event of cardiac or respiratory ARREST Do not perform Intubation, CPR, defibrillation or ACLS   In the event of cardiac or respiratory ARREST Use medication by any route, position, wound care, and other measures to relive pain and suffering. May use oxygen, suction  and manual treatment of airway obstruction as needed for comfort.      08/01/17 1308    Code Status History    Date Active Date Inactive Code Status Order ID Comments User Context   11/06/2014 2158 11/15/2014 1127 Full Code [id number]  Gonzella Lex, MD Inpatient   11/01/2014 1841 11/05/2014 2135 Full Code [id number]  Gonzella Lex, MD Inpatient   10/29/2014 0225 11/01/2014 1841 Full Code [id number]  Hower, Aaron Mose, MD Inpatient   09/13/2014 [phone]  10/29/2014 0225 Full Code [id number]  Gonzella Lex, MD Inpatient   03/11/2014 1348 03/12/2014 0325 Full Code [id number]  Camprubi-Soms, Patty Sermons, PA-C ED      TOTAL TIME TAKING CARE OF THIS PATIENT: 35 minutes.    Loletha Grayer M.D on 08/10/2017 at 8:22 AM  Between 7am to 6pm - Pager - [phone]  After 6pm go to [email or web address] - password [email or web address] [facility]  [facility] Office  [phone]  CC: Primary care physician; Patient, No Pcp Per

## 2017-08-10 NOTE — Clinical Social Work Note (Signed)
EMS and sheriff are here to transport patient to [facility]. Patient's nurse called report to: [phone]. CSW has attempted 3 times to notify patient's son, Melissa George, that patient was discharging as planned. CSW left messages for patient's son.  Shela Leff MSW,LCSW [phone]

## 2017-08-14 NOTE — Clinical Social Work Note (Signed)
CSW received phone call from Rory Percy from [place], [phone] in regards to patient's discharge plan.  [name] was requesting discharge summary and updated psych note from when patient discharge to [facility].  [facility] faxed requested information.  [name]. [place], MSW, [place]  08/14/2017 3:40 PM

## 2018-06-30 ENCOUNTER — Other Ambulatory Visit: Payer: Self-pay

## 2018-06-30 ENCOUNTER — Emergency Department
Admission: EM | Admit: 2018-06-30 | Discharge: 2018-06-30 | Disposition: A | Discharge status: Home / Self Care | Payer: Medicare Other | Attending: Emergency Medicine | Admitting: Emergency Medicine

## 2018-06-30 ENCOUNTER — Emergency Department: Payer: Medicare Other

## 2018-06-30 DIAGNOSIS — Z7982 Long term (current) use of aspirin: Secondary | ICD-10-CM | POA: Diagnosis not present

## 2018-06-30 DIAGNOSIS — Z9981 Dependence on supplemental oxygen: Secondary | ICD-10-CM | POA: Diagnosis not present

## 2018-06-30 DIAGNOSIS — W19XXXA Unspecified fall, initial encounter: Secondary | ICD-10-CM | POA: Diagnosis not present

## 2018-06-30 DIAGNOSIS — Z87891 Personal history of nicotine dependence: Secondary | ICD-10-CM | POA: Insufficient documentation

## 2018-06-30 DIAGNOSIS — J449 Chronic obstructive pulmonary disease, unspecified: Secondary | ICD-10-CM | POA: Insufficient documentation

## 2018-06-30 DIAGNOSIS — I2510 Atherosclerotic heart disease of native coronary artery without angina pectoris: Secondary | ICD-10-CM | POA: Diagnosis not present

## 2018-06-30 DIAGNOSIS — M25552 Pain in left hip: Secondary | ICD-10-CM

## 2018-06-30 DIAGNOSIS — Z79899 Other long term (current) drug therapy: Secondary | ICD-10-CM | POA: Insufficient documentation

## 2018-06-30 MED ORDER — OXYCODONE-ACETAMINOPHEN 5-325 MG PO TABS
1.0000 | ORAL_TABLET | Freq: Once | ORAL | Status: AC
  Administered 2018-06-30: 1 via ORAL
  Filled 2018-06-30: qty 1

## 2018-06-30 NOTE — ED Notes (Signed)
Pt taken to CT via stretcher.

## 2018-06-30 NOTE — ED Triage Notes (Signed)
Pt arrives to ED via [facility] from a group home. ([address], [place])   Pt fell last night out of bed at 11:30, got back in bed. This AM c/o of L hip pain. Previous fx in L hip.  EMS gave 4mg  zofran and 119mcg fentanyl enroute. Arrives with 20 gauge L FA.   At baseline pt wears oxygen 3-4 L [place] for COPD.   Pt arrived with knee bent, able to straighten L leg out. Able to turn on side.

## 2018-06-30 NOTE — ED Notes (Signed)
Pt dressed and waiting in wheelchair.  Ride is on the way to pick her up.

## 2018-06-30 NOTE — ED Notes (Signed)
Pt ambulated with walker without difficulty witnessed by this RN.

## 2018-06-30 NOTE — ED Notes (Addendum)
Melissa George [phone]   Attempted to call this number at this time, rang busy. Will attempt later.

## 2018-06-30 NOTE — ED Provider Notes (Signed)
[facility] Emergency Department Provider Note  ____________________________________________   I have reviewed the triage vital signs and the nursing notes. Where available I have reviewed prior notes and, if possible and indicated, outside hospital notes.    HISTORY  Chief Complaint Fall and Hip Pain    HPI Melissa George is a 68 y.o. female  History of COPD, anxiety depression, status post left hip fracture and repair remotely, had a non-syncopal fall.  She was ambulating without her walker and fell.  She remembers falling.  She think she hit her head.  She did not pass out she was able to ambulate back to bed but has pain in her left hip today.  No fever no chills no chest pain no shortness of breath no other complaints.  The pain is persistent nonradiating worse when she touches it, no other alleviating or aggravating factors no other prior treatment no other complaints     Past Medical History:  Diagnosis Date  . Anxiety   . Anxiety   . COPD (chronic obstructive pulmonary disease) ([place])   . 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 Depression, major, recurrent, severe with psychosis ([place]) 08/01/2017  . Coronary artery disease 11/06/2014  . NSTEMI (non-ST elevated myocardial infarction) ([place]) 10/29/2014  . Protein-calorie malnutrition, severe ([place]) 10/29/2014  . 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 Delusional disorder, persecutory type ([place]) 09/24/2014  . COPD (chronic obstructive pulmonary disease) ([place]) 09/12/2014  . GAD (generalized anxiety disorder)     Past Surgical History:  Procedure Laterality Date  . ABDOMINAL HYSTERECTOMY    . APPENDECTOMY    . CARDIAC CATHETERIZATION N/A 10/31/2014   Procedure: Left Heart Cath and Coronary Angiography;  Surgeon: Isaias Cowman, MD;  Location: [facility];  Service: Cardiovascular;   Laterality: N/A;  . CESAREAN SECTION    . HAND SURGERY      Prior to Admission medications   Medication Sig Start Date End Date Taking? Authorizing Provider  acetaminophen (TYLENOL) 325 MG tablet Take 2 tablets (650 mg total) by mouth every 6 (six) hours as needed for mild pain (or Fever >/= 101). 08/10/17   Loletha Grayer, MD  albuterol (PROVENTIL HFA;VENTOLIN HFA) 108 (90 BASE) MCG/ACT inhaler Inhale 2 puffs into the lungs every 6 (six) hours as needed for wheezing or shortness of breath. Patient not taking: Reported on 08/01/2017 11/11/14   Hildred Priest, MD  albuterol (PROVENTIL) (2.5 MG/3ML) 0.083% nebulizer solution Take 3 mLs (2.5 mg total) by nebulization every 6 (six) hours as needed for wheezing or shortness of breath. 08/10/17   Loletha Grayer, MD  aspirin 81 MG chewable tablet Chew 1 tablet (81 mg total) by mouth before cath procedure. 11/05/14   Pucilowska, Herma Ard B, MD  atorvastatin (LIPITOR) 20 MG tablet Take 1 tablet (20 mg total) by mouth daily at 6 PM. 11/11/14   Hildred Priest, MD  Calcium Carbonate-Vitamin D3 (CALCIUM 600-D) 600-400 MG-UNIT TABS Take 1 tablet by mouth daily.    [provider]  cholecalciferol (D-VI-SOL) 400 UNIT/ML LIQD Take 1 mL (400 Units total) by mouth daily with breakfast. Patient not taking: Reported on 08/01/2017 11/11/14   Hildred Priest, MD  docusate sodium (COLACE) 100 MG capsule Take 2 capsules (200 mg total) by mouth 2 (two) times daily. Patient not taking: Reported on 08/01/2017 11/05/14   Clovis Fredrickson, MD  dronabinol (MARINOL) 5  MG capsule Take 1 capsule (5 mg total) by mouth 2 (two) times daily before lunch and supper. 08/10/17   Loletha Grayer, MD  feeding supplement, ENSURE ENLIVE, (ENSURE ENLIVE) LIQD Take 237 mLs by mouth 3 (three) times daily with meals. Patient not taking: Reported on 08/01/2017 11/11/14   Hildred Priest, MD  levothyroxine (SYNTHROID, LEVOTHROID) 50 MCG tablet Take 50  mcg by mouth every morning.    [provider]  metoprolol succinate (TOPROL-XL) 25 MG 24 hr tablet Take 0.5 tablets (12.5 mg total) by mouth at bedtime. 08/10/17   Loletha Grayer, MD  mirtazapine (REMERON) 45 MG tablet Take 1 tablet (45 mg total) by mouth at bedtime. Patient not taking: Reported on 08/01/2017 11/11/14   Hildred Priest, MD  Multiple Vitamin (MULTIVITAMIN WITH MINERALS) TABS tablet Take 1 tablet by mouth daily. 11/11/14   Hildred Priest, MD  OLANZapine (ZYPREXA) 7.5 MG tablet Take 1 tablet (7.5 mg total) by mouth at bedtime. 08/10/17   Loletha Grayer, MD  oxyCODONE (OXY IR/ROXICODONE) 5 MG immediate release tablet Take 1 tablet (5 mg total) by mouth every 12 (twelve) hours as needed for severe pain. 08/10/17   Loletha Grayer, MD  polyethylene glycol (MIRALAX / Floria Raveling) packet Take 17 g by mouth daily as needed for mild constipation. 08/10/17   Loletha Grayer, MD  tiotropium (SPIRIVA) 18 MCG inhalation capsule Place 1 capsule (18 mcg total) into inhaler and inhale daily. Patient not taking: Reported on 08/01/2017 11/11/14   Hildred Priest, MD  vitamin C (VITAMIN C) 250 MG tablet Take 1 tablet (250 mg total) by mouth 2 (two) times daily. 08/10/17   Loletha Grayer, MD    Allergies Ciprofloxacin; Levofloxacin; Morphine and related; Sulfa antibiotics; Symbicort [budesonide-formoterol fumarate]; Advair diskus [fluticasone-salmeterol]; and Nickel  Family History  Problem Relation Age of Onset  . CAD Mother   . Thyroid cancer Other   . Lung cancer Other     Social History Social History   Tobacco Use  . Smoking status: Former Smoker    Packs/day: 0.25    Years: 15.00    Pack years: 3.75    Types: Cigarettes    Last attempt to quit: 09/13/2014    Years since quitting: 3.7  . Smokeless tobacco: Never Used  Substance Use Topics  . Alcohol use: No  . Drug use: No    Review of Systems Constitutional: No fever/chills Eyes: No visual  changes. ENT: No sore throat. No stiff neck no neck pain Cardiovascular: Denies chest pain. Respiratory: Denies shortness of breath. Gastrointestinal:   no vomiting.  No diarrhea.  No constipation. Genitourinary: Negative for dysuria. Musculoskeletal: Negative lower extremity swelling Skin: Negative for rash. Neurological: Negative for severe headaches, focal weakness or numbness.   ____________________________________________   PHYSICAL EXAM:  VITAL SIGNS: ED Triage Vitals  Enc Vitals Group     BP 06/30/18 0759 138/88     Pulse Rate 06/30/18 0759 98     Resp 06/30/18 0759 18     Temp 06/30/18 0759 98 F (36.7 C)     Temp Source 06/30/18 0759 Oral     SpO2 06/30/18 0759 94 %     Weight 06/30/18 0756 102 lb (46.3 kg)     Height 06/30/18 0756 5\' 1"  (1.549 m)     Head Circumference --      Peak Flow --      Pain Score 06/30/18 0756 0     Pain Loc --      Pain Edu? --  Excl. in [place]? --     Constitutional: Alert and oriented and in place, and roughly on the date..  In no acute distress but chronically cachectic in appearance Eyes: Conjunctivae are normal Head: Atraumatic HEENT: No congestion/rhinnorhea. Mucous membranes are moist.  Oropharynx non-erythematous Neck:   Nontender with no meningismus, no masses, no stridor Cardiovascular: Normal rate, regular rhythm. Grossly normal heart sounds.  Good peripheral circulation. Respiratory: Normal respiratory effort.  No retractions. Lungs CTAB. Abdominal: Soft and nontender. No distention. No guarding no rebound Back:  There is no focal tenderness or step off.  there is no midline tenderness there are no lesions noted. there is no CVA tenderness Musculoskeletal: Tenderness palpation of left hip but none to the knee or the ankle and she has full range of motion no upper extremity tenderness. No joint effusions, no DVT signs strong distal pulses no edema Neurologic:  Normal speech and language. No gross focal neurologic deficits  are appreciated.  Skin:  Skin is warm, dry and intact. No rash noted. Psychiatric: Mood and affect are normal. Speech and behavior are normal.  ____________________________________________   LABS (all labs ordered are listed, but only abnormal results are displayed)  Labs Reviewed - No data to display  Pertinent labs  results that were available during my care of the patient were reviewed by me and considered in my medical decision making (see chart for details). ____________________________________________  EKG  I personally interpreted any EKGs ordered by me or triage Sinus rhythm rate 91 bpm normal axis no acute ST elevation or depression nonspecific ST changes ____________________________________________  RADIOLOGY  Pertinent labs & imaging results that were available during my care of the patient were reviewed by me and considered in my medical decision making (see chart for details). If possible, patient and/or family made aware of any abnormal findings.  Dg Chest 1 View  Result Date: 06/30/2018 CLINICAL DATA:  Fall today with lateral left hip pain. EXAM: CHEST  1 VIEW COMPARISON:  08/01/2017, 09/28/2014, 06/22/2009 and CT 10/02/2014 FINDINGS: Lordotic technique demonstrated. Lungs are hyperexpanded without focal airspace consolidation or effusion. Nodule opacity over the right apex unchanged from 2011. Cardiomediastinal silhouette and remainder of the exam is unchanged. IMPRESSION: No acute cardiopulmonary disease. Electronically Signed   By: Marin Olp M.D.   On: 06/30/2018 08:37   Ct Head Wo Contrast  Result Date: 06/30/2018 CLINICAL DATA:  fell last night out of bed at 11:30, got back in bed. This AM c/o of L hip pain. Previous fx in L hip. EMS gave 4mg  zofran and 151mcg fentanyl enroute. Arrives with 20 gauge L FA. At baseline pt wears oxygen 3-4 L [place] for COPD. EXAM: CT HEAD WITHOUT CONTRAST TECHNIQUE: Contiguous axial images were obtained from the base of the skull  through the vertex without intravenous contrast. COMPARISON:  None. FINDINGS: Brain: Mild atrophy. No evidence of acute infarction, hemorrhage, hydrocephalus, extra-axial collection or mass lesion/mass effect. Vascular: No hyperdense vessel or unexpected calcification. Skull: Normal. Negative for fracture or focal lesion. Sinuses/Orbits: No acute finding. Other: None. IMPRESSION: 1. Negative for bleed or other acute intracranial process. 2. Mild atrophy. Electronically Signed   By: Lucrezia Europe M.D.   On: 06/30/2018 09:24   Dg Hip Unilat W Or Wo Pelvis 2-3 Views Left  Result Date: 06/30/2018 CLINICAL DATA:  Fall today with lateral left hip pain and bruising. EXAM: DG HIP (WITH OR WITHOUT PELVIS) 2-3V LEFT COMPARISON:  None. FINDINGS: Diffuse decreased bone density. Left hip arthroplasty intact and  normally located. No evidence of acute fracture or dislocation. Degenerative change of the spine. IMPRESSION: No acute findings. Left hip arthroplasty intact. Electronically Signed   By: Marin Olp M.D.   On: 06/30/2018 08:35   ____________________________________________    PROCEDURES  Procedure(s) performed: None  Procedures  Critical Care performed: None  ____________________________________________   INITIAL IMPRESSION / ASSESSMENT AND PLAN / ED COURSE  Pertinent labs & imaging results that were available during my care of the patient were reviewed by me and considered in my medical decision making (see chart for details).  Patient here for a non-syncopal fall with hip pain, CT is negative x-rays are negative and her hip again does not appear to be injured.  Nothing to suggest that this is a knee or ankle issue either, nor is there any evidence of acetabular or other fracture.  We will ensure that she can ambulate after pain medication.  Blood pressures are running little low but her cuff was a little loose on her when I rechecked it, as soon as I readjusted the cuff of the blood pressures are  normal and she has no evidence of hypotension.  It is my hope that with some analgesia she is going to be able to ambulate.  She states she is not allergic to Percocet.  Will not discharge her with Percocet however.    ____________________________________________   FINAL CLINICAL IMPRESSION(S) / ED DIAGNOSES  Final diagnoses:  None      This chart was dictated using voice recognition software.  Despite best efforts to proofread,  errors can occur which can change meaning.      Schuyler Amor, MD 06/30/18 1002

## 2018-07-26 ENCOUNTER — Other Ambulatory Visit (HOSPITAL_COMMUNITY): Payer: Self-pay | Admitting: Family Medicine

## 2018-07-26 ENCOUNTER — Other Ambulatory Visit: Payer: Self-pay | Admitting: Family Medicine

## 2018-07-26 DIAGNOSIS — M25552 Pain in left hip: Secondary | ICD-10-CM

## 2018-09-13 ENCOUNTER — Other Ambulatory Visit: Payer: Self-pay

## 2018-09-13 ENCOUNTER — Ambulatory Visit
Admission: RE | Admit: 2018-09-13 | Discharge: 2018-09-13 | Disposition: A | Discharge status: Home / Self Care | Payer: Medicare Other | Source: Ambulatory Visit | Attending: Family Medicine | Admitting: Family Medicine

## 2018-09-13 DIAGNOSIS — M25552 Pain in left hip: Secondary | ICD-10-CM

## 2018-12-12 ENCOUNTER — Ambulatory Visit: Payer: Medicare Other

## 2019-07-06 ENCOUNTER — Other Ambulatory Visit: Payer: Self-pay

## 2019-07-06 ENCOUNTER — Emergency Department: Payer: Medicare (Managed Care)

## 2019-07-06 ENCOUNTER — Emergency Department
Admission: EM | Admit: 2019-07-06 | Discharge: 2019-07-06 | Disposition: A | Discharge status: Home / Self Care | Payer: Medicare (Managed Care) | Attending: Emergency Medicine | Admitting: Emergency Medicine

## 2019-07-06 DIAGNOSIS — Z87891 Personal history of nicotine dependence: Secondary | ICD-10-CM | POA: Diagnosis not present

## 2019-07-06 DIAGNOSIS — J449 Chronic obstructive pulmonary disease, unspecified: Secondary | ICD-10-CM | POA: Insufficient documentation

## 2019-07-06 DIAGNOSIS — I252 Old myocardial infarction: Secondary | ICD-10-CM | POA: Diagnosis not present

## 2019-07-06 DIAGNOSIS — R0600 Dyspnea, unspecified: Secondary | ICD-10-CM

## 2019-07-06 DIAGNOSIS — R0602 Shortness of breath: Secondary | ICD-10-CM | POA: Diagnosis present

## 2019-07-06 LAB — CBC
HCT: 38.4 % (ref 36.0–46.0)
Hemoglobin: 12.7 g/dL (ref 12.0–15.0)
MCH: 31.4 pg (ref 26.0–34.0)
MCHC: 33.1 g/dL (ref 30.0–36.0)
MCV: 94.8 fL (ref 80.0–100.0)
Platelets: 175 10*3/uL (ref 150–400)
RBC: 4.05 MIL/uL (ref 3.87–5.11)
RDW: 12.2 % (ref 11.5–15.5)
WBC: 7 10*3/uL (ref 4.0–10.5)
nRBC: 0 % (ref 0.0–0.2)

## 2019-07-06 LAB — BASIC METABOLIC PANEL
Anion gap: 7 (ref 5–15)
BUN: 21 mg/dL (ref 8–23)
CO2: 30 mmol/L (ref 22–32)
Calcium: 8.9 mg/dL (ref 8.9–10.3)
Chloride: 105 mmol/L (ref 98–111)
Creatinine, Ser: 0.63 mg/dL (ref 0.44–1.00)
GFR calc Af Amer: >60 mL/min (ref >60)
GFR calc non Af Amer: >60 mL/min (ref >60)
Glucose, Bld: 99 mg/dL (ref 70–99)
Potassium: 4.3 mmol/L (ref 3.5–5.1)
Sodium: 142 mmol/L (ref 135–145)

## 2019-07-06 LAB — TROPONIN I (HIGH SENSITIVITY): Troponin I (High Sensitivity): 4 ng/L (ref <18)

## 2019-07-06 MED ORDER — ONDANSETRON 4 MG PO TBDP: 4.0000 mg | ORAL_TABLET | Freq: Three times a day (TID) | ORAL | 0 refills | Status: AC | PRN

## 2019-07-06 NOTE — ED Notes (Signed)
Group home contacted at this time for safe pt discharge. Group home updated on pt status.

## 2019-07-06 NOTE — ED Notes (Signed)
Pt currently in xray

## 2019-07-06 NOTE — ED Notes (Signed)
Waiting on group home personal to pick pt up. Not able to discharge pt at this time.

## 2019-07-06 NOTE — ED Provider Notes (Signed)
[facility] Emergency Department Provider Note       Time seen: ----------------------------------------- 5:48 PM on 07/06/2019 -----------------------------------------   I have reviewed the triage vital signs and the nursing notes.  HISTORY   Chief Complaint Shortness of Breath and Constipation    HPI Melissa George is a 69 y.o. female with a history of anxiety, COPD, coronary artery disease who presents to the ED for shortness of breath that has lasted for months.  She is also been constipated.  She is on 3 L nasal cannula oxygen all the time with a history of COPD.  She has complained of some nausea as well.  Past Medical History:  Diagnosis Date  . Anxiety   . Anxiety   . COPD (chronic obstructive pulmonary disease) ([place])   . 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 Depression, major, recurrent, severe with psychosis ([place]) 08/01/2017  . Coronary artery disease 11/06/2014  . NSTEMI (non-ST elevated myocardial infarction) ([place]) 10/29/2014  . Protein-calorie malnutrition, severe ([place]) 10/29/2014  . Major depressive disorder, recurrent episode, severe with catatonia ([place]) 10/24/2014  . Delusional disorder, persecutory type ([place]) 09/24/2014  . COPD (chronic obstructive pulmonary disease) ([place]) 09/12/2014  . GAD (generalized anxiety disorder)     Past Surgical History:  Procedure Laterality Date  . ABDOMINAL HYSTERECTOMY    . APPENDECTOMY    . CARDIAC CATHETERIZATION N/A 10/31/2014   Procedure: Left Heart Cath and Coronary Angiography;  Surgeon: Isaias Cowman, MD;  Location: [facility];  Service: Cardiovascular;  Laterality: N/A;  . CESAREAN SECTION    . HAND SURGERY      Allergies Ciprofloxacin, Levofloxacin, Morphine and related, Sulfa antibiotics, Symbicort [budesonide-formoterol fumarate], Advair diskus [fluticasone-salmeterol], and Nickel  Social History Social History    Tobacco Use  . Smoking status: Former Smoker    Packs/day: 0.25    Years: 15.00    Pack years: 3.75    Types: Cigarettes    Quit date: 09/13/2014    Years since quitting: 4.8  . Smokeless tobacco: Never Used  Substance Use Topics  . Alcohol use: No  . Drug use: No    Review of Systems Constitutional: Negative for fever. Cardiovascular: Negative for chest pain. Respiratory: Positive for shortness of breath Gastrointestinal: Negative for abdominal pain, positive for constipation, nausea Musculoskeletal: Negative for back pain. Skin: Negative for rash. Neurological: Negative for headaches, focal weakness or numbness.  All systems negative/normal/unremarkable except as stated in the HPI  ____________________________________________   PHYSICAL EXAM:  VITAL SIGNS: ED Triage Vitals [07/06/19 1512]  Enc Vitals Group     BP 131/75     Pulse Rate 84     Resp 18     Temp 98.4 F (36.9 C)     Temp Source Oral     SpO2 99 %     Weight 110 lb (49.9 kg)     Height 5\' 2"  (1.575 m)     Head Circumference      Peak Flow      Pain Score 0     Pain Loc      Pain Edu?      Excl. in [place]?     Constitutional: Alert and oriented. Well appearing and in no distress. Eyes: Conjunctivae are normal. Normal extraocular movements. Cardiovascular: Normal rate, regular rhythm. No murmurs, rubs, or gallops. Respiratory: Normal respiratory effort without tachypnea nor retractions. Breath sounds are clear and  equal bilaterally. No wheezes/rales/rhonchi. Gastrointestinal: Soft and nontender. Normal bowel sounds Musculoskeletal: Nontender with normal range of motion in extremities. No lower extremity tenderness nor edema. Neurologic:  Normal speech and language. No gross focal neurologic deficits are appreciated.  Skin:  Skin is warm, dry and intact. No rash noted. Psychiatric: Mood and affect are normal. Speech and behavior are normal.  ____________________________________________  EKG:  Interpreted by me.  Sinus rhythm with rate of 70 bpm, normal PR interval, normal QRS, normal QT  ____________________________________________  ED COURSE:  As part of my medical decision making, I reviewed the following data within the [place] History obtained from family if available, nursing notes, old chart and ekg, as well as notes from prior ED visits. Patient presented for multiple complaints, we will assess with labs and imaging as indicated at this time.   Procedures  Melissa George was evaluated in Emergency Department on 07/06/2019 for the symptoms described in the history of present illness. She was evaluated in the context of the global COVID-19 pandemic, which necessitated consideration that the patient might be at risk for infection with the SARS-CoV-2 virus that causes COVID-19. Institutional protocols and algorithms that pertain to the evaluation of patients at risk for COVID-19 are in a state of rapid change based on information released by regulatory bodies including the CDC and federal and state organizations. These policies and algorithms were followed during the patient's care in the ED.  ____________________________________________   LABS (pertinent positives/negatives)  Labs Reviewed  BASIC METABOLIC PANEL  CBC  TROPONIN I (HIGH SENSITIVITY)  TROPONIN I (HIGH SENSITIVITY)    RADIOLOGY Images were viewed by me CXR IMPRESSION: No acute cardiopulmonary disease identified. Emphysema.  ____________________________________________   DIFFERENTIAL DIAGNOSIS   COPD, depression, anxiety, coronary artery disease, chronic respiratory failure  FINAL ASSESSMENT AND PLAN  Dyspnea   Plan: The patient had presented for dyspnea that has been going on for months as well as nausea and some constipation. Patient's labs were reassuring. Patient's imaging did not reveal any acute process.  She appears cleared for outpatient follow-up.   Laurence Aly, MD    Note: This note was generated in part or whole with voice recognition software. Voice recognition is usually quite accurate but there are transcription errors that can and very often do occur. I apologize for any typographical errors that were not detected and corrected.     Earleen Newport, MD 07/06/19 [phone]

## 2019-07-06 NOTE — ED Triage Notes (Signed)
Pt arrives via [facility] for SOB "x months" and constipation since thanksgiving. 3 L chronic [place], hx COPD. Pt also c/o nausea, received zofran through 18 G R FA with EMS. Pt VSS WNL, CBG 104.

## 2019-07-06 NOTE — ED Notes (Signed)
ED Provider at bedside. 

## 2019-07-06 NOTE — ED Notes (Signed)
This RN contacted pt's group home at this time, per worker- they have patient's original paperwork and MAR, and do not need the copy back from [place].

## 2019-07-06 NOTE — ED Notes (Signed)
[place] on patient. Pt son informed that pt is currently waiting to see MD. Pt son verbalized understanding.

## 2019-09-25 ENCOUNTER — Inpatient Hospital Stay
Admission: EM | Admit: 2019-09-25 | Discharge: 2019-10-04 | DRG: 190 | Disposition: A | Discharge status: Hospice | Payer: Medicare (Managed Care) | Attending: Internal Medicine | Admitting: Internal Medicine

## 2019-09-25 ENCOUNTER — Other Ambulatory Visit: Payer: Self-pay

## 2019-09-25 ENCOUNTER — Emergency Department: Payer: Medicare (Managed Care)

## 2019-09-25 DIAGNOSIS — F329 Major depressive disorder, single episode, unspecified: Secondary | ICD-10-CM | POA: Diagnosis present

## 2019-09-25 DIAGNOSIS — R1030 Lower abdominal pain, unspecified: Secondary | ICD-10-CM

## 2019-09-25 DIAGNOSIS — J439 Emphysema, unspecified: Principal | ICD-10-CM | POA: Diagnosis present

## 2019-09-25 DIAGNOSIS — Z87891 Personal history of nicotine dependence: Secondary | ICD-10-CM

## 2019-09-25 DIAGNOSIS — R0602 Shortness of breath: Secondary | ICD-10-CM

## 2019-09-25 DIAGNOSIS — Z882 Allergy status to sulfonamides status: Secondary | ICD-10-CM

## 2019-09-25 DIAGNOSIS — J441 Chronic obstructive pulmonary disease with (acute) exacerbation: Secondary | ICD-10-CM | POA: Diagnosis present

## 2019-09-25 DIAGNOSIS — Z881 Allergy status to other antibiotic agents status: Secondary | ICD-10-CM

## 2019-09-25 DIAGNOSIS — Z7189 Other specified counseling: Secondary | ICD-10-CM

## 2019-09-25 DIAGNOSIS — F419 Anxiety disorder, unspecified: Secondary | ICD-10-CM | POA: Diagnosis present

## 2019-09-25 DIAGNOSIS — Z9981 Dependence on supplemental oxygen: Secondary | ICD-10-CM

## 2019-09-25 DIAGNOSIS — Z7951 Long term (current) use of inhaled steroids: Secondary | ICD-10-CM

## 2019-09-25 DIAGNOSIS — Z888 Allergy status to other drugs, medicaments and biological substances status: Secondary | ICD-10-CM

## 2019-09-25 DIAGNOSIS — F418 Other specified anxiety disorders: Secondary | ICD-10-CM

## 2019-09-25 DIAGNOSIS — I252 Old myocardial infarction: Secondary | ICD-10-CM

## 2019-09-25 DIAGNOSIS — R918 Other nonspecific abnormal finding of lung field: Secondary | ICD-10-CM

## 2019-09-25 DIAGNOSIS — Z79899 Other long term (current) drug therapy: Secondary | ICD-10-CM

## 2019-09-25 DIAGNOSIS — Z79890 Hormone replacement therapy (postmenopausal): Secondary | ICD-10-CM

## 2019-09-25 DIAGNOSIS — Z885 Allergy status to narcotic agent status: Secondary | ICD-10-CM

## 2019-09-25 DIAGNOSIS — E785 Hyperlipidemia, unspecified: Secondary | ICD-10-CM

## 2019-09-25 DIAGNOSIS — Z91048 Other nonmedicinal substance allergy status: Secondary | ICD-10-CM

## 2019-09-25 DIAGNOSIS — K5900 Constipation, unspecified: Secondary | ICD-10-CM

## 2019-09-25 DIAGNOSIS — J9621 Acute and chronic respiratory failure with hypoxia: Secondary | ICD-10-CM | POA: Diagnosis present

## 2019-09-25 DIAGNOSIS — I700 Atherosclerosis of aorta: Secondary | ICD-10-CM | POA: Diagnosis present

## 2019-09-25 DIAGNOSIS — R911 Solitary pulmonary nodule: Secondary | ICD-10-CM

## 2019-09-25 DIAGNOSIS — Z8249 Family history of ischemic heart disease and other diseases of the circulatory system: Secondary | ICD-10-CM

## 2019-09-25 DIAGNOSIS — E039 Hypothyroidism, unspecified: Secondary | ICD-10-CM

## 2019-09-25 DIAGNOSIS — Z20822 Contact with and (suspected) exposure to covid-19: Secondary | ICD-10-CM | POA: Diagnosis present

## 2019-09-25 DIAGNOSIS — Z66 Do not resuscitate: Secondary | ICD-10-CM

## 2019-09-25 DIAGNOSIS — Z515 Encounter for palliative care: Secondary | ICD-10-CM

## 2019-09-25 LAB — COMPREHENSIVE METABOLIC PANEL
ALT: 19 U/L (ref 0–44)
AST: 25 U/L (ref 15–41)
Albumin: 3.9 g/dL (ref 3.5–5.0)
Alkaline Phosphatase: 55 U/L (ref 38–126)
Anion gap: 8 (ref 5–15)
BUN: 22 mg/dL (ref 8–23)
CO2: 33 mmol/L — ABNORMAL HIGH (ref 22–32)
Calcium: 9.1 mg/dL (ref 8.9–10.3)
Chloride: 103 mmol/L (ref 98–111)
Creatinine, Ser: 0.85 mg/dL (ref 0.44–1.00)
GFR calc Af Amer: >60 mL/min (ref >60)
GFR calc non Af Amer: >60 mL/min (ref >60)
Glucose, Bld: 136 mg/dL — ABNORMAL HIGH (ref 70–99)
Potassium: 3.9 mmol/L (ref 3.5–5.1)
Sodium: 144 mmol/L (ref 135–145)
Total Bilirubin: 0.5 mg/dL (ref 0.3–1.2)
Total Protein: 6.8 g/dL (ref 6.5–8.1)

## 2019-09-25 LAB — PROTIME-INR
INR: 0.9 (ref 0.8–1.2)
Prothrombin Time: 12.2 seconds (ref 11.4–15.2)

## 2019-09-25 LAB — TROPONIN I (HIGH SENSITIVITY): Troponin I (High Sensitivity): 11 ng/L (ref <18)

## 2019-09-25 LAB — CBC WITH DIFFERENTIAL/PLATELET
Abs Immature Granulocytes: 0.06 10*3/uL (ref 0.00–0.07)
Basophils Absolute: 0 10*3/uL (ref 0.0–0.1)
Basophils Relative: 0 %
Eosinophils Absolute: 0.1 10*3/uL (ref 0.0–0.5)
Eosinophils Relative: 1 %
HCT: 35 % — ABNORMAL LOW (ref 36.0–46.0)
Hemoglobin: 11.7 g/dL — ABNORMAL LOW (ref 12.0–15.0)
Immature Granulocytes: 1 %
Lymphocytes Relative: 8 %
Lymphs Abs: 0.9 10*3/uL (ref 0.7–4.0)
MCH: 31.2 pg (ref 26.0–34.0)
MCHC: 33.4 g/dL (ref 30.0–36.0)
MCV: 93.3 fL (ref 80.0–100.0)
Monocytes Absolute: 0.8 10*3/uL (ref 0.1–1.0)
Monocytes Relative: 7 %
Neutro Abs: 9.3 10*3/uL — ABNORMAL HIGH (ref 1.7–7.7)
Neutrophils Relative %: 83 %
Platelets: 199 10*3/uL (ref 150–400)
RBC: 3.75 MIL/uL — ABNORMAL LOW (ref 3.87–5.11)
RDW: 12.2 % (ref 11.5–15.5)
WBC: 11.2 10*3/uL — ABNORMAL HIGH (ref 4.0–10.5)
nRBC: 0 % (ref 0.0–0.2)

## 2019-09-25 LAB — TYPE AND SCREEN
ABO/RH(D): A POS
Antibody Screen: NEGATIVE

## 2019-09-25 LAB — APTT: aPTT: 24 seconds (ref 24–36)

## 2019-09-25 LAB — BRAIN NATRIURETIC PEPTIDE: B Natriuretic Peptide: 25.2 pg/mL (ref 0.0–100.0)

## 2019-09-25 MED ORDER — IPRATROPIUM-ALBUTEROL 0.5-2.5 (3) MG/3ML IN SOLN
3.0000 mL | Freq: Once | RESPIRATORY_TRACT | Status: AC
  Administered 2019-09-25: 3 mL via RESPIRATORY_TRACT
  Filled 2019-09-25: qty 3

## 2019-09-25 MED ORDER — IOHEXOL 350 MG/ML SOLN
75.0000 mL | Freq: Once | INTRAVENOUS | Status: AC | PRN
  Administered 2019-09-25: 75 mL via INTRAVENOUS

## 2019-09-25 MED ORDER — METHYLPREDNISOLONE SODIUM SUCC 125 MG IJ SOLR
125.0000 mg | Freq: Once | INTRAMUSCULAR | Status: AC
  Administered 2019-09-25: 125 mg via INTRAVENOUS
  Filled 2019-09-25: qty 2

## 2019-09-25 NOTE — ED Triage Notes (Signed)
Pt here from home via [facility].  EMS reports pt with worsening SHOB x 2 weeks, with increased today. Pt used x2 home breathing treatments with no relief. Pt found to be satting in the low 80's with EMS on RA. Pt placed on 2L [place] and satting at 97%. Pt has PRN at home order for 2L .  Pt also reports red stool for the past 2 weeks. Pt also reports constipation and lower abdominal pain.   Pt received 4mg  zofran with EMS.

## 2019-09-25 NOTE — ED Provider Notes (Signed)
[facility] Emergency Department Provider Note  ____________________________________________   First MD Initiated Contact with Patient 09/25/19 2130     (approximate)  I have reviewed the triage vital signs and the nursing notes.   HISTORY  Chief Complaint Abdominal Pain and Shortness of Breath    HPI Melissa George is a 69 y.o. female with COPD on 2 L intermittently who comes in for concerns for worsening shortness of breath abdominal pain.  Patient states that she has had some shortness of breath for the past 2 weeks but seem to be acutely worsening today.  She denies any cough or fevers.  Patient states that she uses 2 L for her COPD.  She also reports worsening abdominal pain in the lower abdomen associate with some nausea and vomiting.  She reports that hard to have a bowel movement she is concerned there might be some blood in it as well.  The blood was bright red in nature.          Past Medical History:  Diagnosis Date  . Anxiety   . Anxiety   . COPD (chronic obstructive pulmonary disease) ([place])   . 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 Coronary artery disease 11/06/2014  . NSTEMI (non-ST elevated myocardial infarction) ([place]) 10/29/2014  . Protein-calorie malnutrition, severe ([place]) 10/29/2014  . 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) 09/24/2014  . COPD (chronic obstructive pulmonary disease) ([place]) 09/12/2014  . GAD (generalized anxiety disorder)     Past Surgical History:  Procedure Laterality Date  . ABDOMINAL HYSTERECTOMY    . APPENDECTOMY    . CARDIAC CATHETERIZATION N/A 10/31/2014   Procedure: Left Heart Cath and Coronary Angiography;  Surgeon: Isaias Cowman, MD;  Location: [facility];  Service:  Cardiovascular;  Laterality: N/A;  . CESAREAN SECTION    . HAND SURGERY      Prior to Admission medications   Medication Sig Start Date End Date Taking? Authorizing Provider  acetaminophen (TYLENOL) 325 MG tablet Take 2 tablets (650 mg total) by mouth every 6 (six) hours as needed for mild pain (or Fever >/= 101). 08/10/17   Loletha Grayer, MD  albuterol (PROVENTIL HFA;VENTOLIN HFA) 108 (90 BASE) MCG/ACT inhaler Inhale 2 puffs into the lungs every 6 (six) hours as needed for wheezing or shortness of breath. 11/11/14   Hildred Priest, MD  albuterol (PROVENTIL) (2.5 MG/3ML) 0.083% nebulizer solution Take 3 mLs (2.5 mg total) by nebulization every 6 (six) hours as needed for wheezing or shortness of breath. 08/10/17   Loletha Grayer, MD  atorvastatin (LIPITOR) 20 MG tablet Take 1 tablet (20 mg total) by mouth daily at 6 PM. Patient taking differently: Take 20 mg by mouth at bedtime.  11/11/14   Hildred Priest, MD  Calcium Carbonate-Vitamin D3 (CALCIUM 600-D) 600-400 MG-UNIT TABS Take 1 tablet by mouth daily.    [provider]  docusate sodium (COLACE) 100 MG capsule Take 2 capsules (200 mg total) by mouth 2 (two) times daily. Patient taking differently: Take 100 mg by mouth 2 (two) times daily.  11/05/14   Pucilowska, Jolanta B, MD  escitalopram (LEXAPRO) 20 MG tablet Take 20 mg by mouth daily. 02/19/18   [provider]  hydrOXYzine (VISTARIL) 25 MG capsule TAKE ONE CAPSULE BY MOUTH EVERY 6 HOURS  AS NEEDED 02/09/18   [provider]  levothyroxine (SYNTHROID, LEVOTHROID) 50 MCG tablet Take 50 mcg by mouth daily. On Monday, Wednesday, Friday    [provider]  levothyroxine (SYNTHROID, LEVOTHROID) 75 MCG tablet Take 75 mcg by mouth daily. On Thursday, Saturday, Sundays 02/19/18   [provider]  loperamide (IMODIUM A-D) 2 MG tablet Take 4 mg by mouth daily as needed for diarrhea or loose stools.    [provider]  magnesium  hydroxide (MILK OF MAGNESIA) 400 MG/5ML suspension Take 30 mLs by mouth daily as needed for mild constipation.    [provider]  mirtazapine (REMERON) 30 MG tablet Take 30 mg by mouth at bedtime. 02/19/18   [provider]  Multiple Vitamin (MULTIVITAMIN WITH MINERALS) TABS tablet Take 1 tablet by mouth daily. 11/11/14   Hildred Priest, MD  ondansetron (ZOFRAN ODT) 4 MG disintegrating tablet Take 1 tablet (4 mg total) by mouth every 8 (eight) hours as needed for nausea or vomiting. 07/06/19   Earleen Newport, MD  perphenazine (TRILAFON) 8 MG tablet Take 8 mg by mouth at bedtime. 02/19/18   [provider]    Allergies Ciprofloxacin, Levofloxacin, Morphine and related, Sulfa antibiotics, Symbicort [budesonide-formoterol fumarate], Advair diskus [fluticasone-salmeterol], and Nickel  Family History  Problem Relation Age of Onset  . CAD Mother   . Thyroid cancer Other   . Lung cancer Other     Social History Social History   Tobacco Use  . Smoking status: Former Smoker    Packs/day: 0.25    Years: 15.00    Pack years: 3.75    Types: Cigarettes    Quit date: 09/13/2014    Years since quitting: 5.0  . Smokeless tobacco: Never Used  Substance Use Topics  . Alcohol use: No  . Drug use: No      Review of Systems Constitutional: No fever/chills Eyes: No visual changes. ENT: No sore throat. Cardiovascular: No chest pain Respiratory: Positive for SOB Gastrointestinal: Positive abdominal pain, blood in stool, vomiting Genitourinary: Negative for dysuria. Musculoskeletal: Negative for back pain. Skin: Negative for rash. Neurological: Negative for headaches, focal weakness or numbness. All other ROS negative ____________________________________________   PHYSICAL EXAM:  VITAL SIGNS: ED Triage Vitals  Enc Vitals Group     BP --      Pulse --      Resp --      Temp --      Temp src --      SpO2 --      Weight 09/25/19 2125 110 lb  3.7 oz (50 kg)     Height 09/25/19 2125 5\' 2"  (1.575 m)     Head Circumference --      Peak Flow --      Pain Score 09/25/19 2123 0     Pain Loc --      Pain Edu? --      Excl. in [place]? --     Constitutional: Alert and oriented. Well appearing and in no acute distress. Eyes: Conjunctivae are normal. EOMI. Head: Atraumatic. Nose: No congestion/rhinnorhea. Mouth/Throat: Mucous membranes are moist.   Neck: No stridor. Trachea Midline. FROM Cardiovascular: Tachycardic, regular rhythm. Grossly normal heart sounds.  Good peripheral circulation. Respiratory: Mild increased work of breathing with some minimal expiratory wheezing. Gastrointestinal: Tender in the lower abdomen.  No distention. No abdominal bruits.  Musculoskeletal: No lower extremity tenderness nor edema.  No joint effusions. Neurologic:  Normal speech and language. No gross focal  neurologic deficits are appreciated.  Skin:  Skin is warm, dry and intact. No rash noted. Psychiatric: Mood and affect are normal. Speech and behavior are normal. GU: Patient had no stool in the vault.  No gross blood on my examination  ____________________________________________   LABS (all labs ordered are listed, but only abnormal results are displayed)  Labs Reviewed  CBC WITH DIFFERENTIAL/PLATELET - Abnormal; Notable for the following components:      Result Value   WBC 11.2 (*)    RBC 3.75 (*)    Hemoglobin 11.7 (*)    HCT 35.0 (*)    Neutro Abs 9.3 (*)    All other components within normal limits  COMPREHENSIVE METABOLIC PANEL - Abnormal; Notable for the following components:   CO2 33 (*)    Glucose, Bld 136 (*)    All other components within normal limits  URINALYSIS, COMPLETE (UACMP) WITH MICROSCOPIC - Abnormal; Notable for the following components:   Color, Urine YELLOW (*)    APPearance HAZY (*)    Specific Gravity, Urine 1.035 (*)    Ketones, ur 5 (*)    All other components within normal limits  LIPASE, BLOOD - Abnormal;  Notable for the following components:   Lipase 61 (*)    All other components within normal limits  BASIC METABOLIC PANEL - Abnormal; Notable for the following components:   Glucose, Bld 231 (*)    Calcium 8.8 (*)    All other components within normal limits  CBC - Abnormal; Notable for the following components:   RBC 3.65 (*)    Hemoglobin 11.4 (*)    HCT 35.3 (*)    All other components within normal limits  TROPONIN I (HIGH SENSITIVITY) - Abnormal; Notable for the following components:   Troponin I (High Sensitivity) 24 (*)    All other components within normal limits  SARS CORONAVIRUS 2 BY RT PCR (HOSPITAL ORDER, [facility])  BRAIN NATRIURETIC PEPTIDE  PROTIME-INR  APTT  HIV ANTIBODY (ROUTINE TESTING W REFLEX)  TYPE AND SCREEN  TROPONIN I (HIGH SENSITIVITY)   ____________________________________________   ED ECG REPORT I, Vanessa Manvel, the attending physician, personally viewed and interpreted this ECG.  EKG is 5 tachycardia rate of 105, no ST ovation, no T wave versions, normal intervals ____________________________________________  RADIOLOGY   Official radiology report(s): CT Angio Chest PE W and/or Wo Contrast  Result Date: 09/26/2019 CLINICAL DATA:  Shortness of breath EXAM: CT ANGIOGRAPHY CHEST WITH CONTRAST TECHNIQUE: Multidetector CT imaging of the chest was performed using the standard protocol during bolus administration of intravenous contrast. Multiplanar CT image reconstructions and MIPs were obtained to evaluate the vascular anatomy. CONTRAST:  37mL OMNIPAQUE IOHEXOL 350 MG/ML SOLN COMPARISON:  10/02/2014 FINDINGS: Cardiovascular: No filling defects in the pulmonary arteries to suggest pulmonary emboli. Heart is normal size. Aorta is normal caliber. Scattered aortic atherosclerosis. Mediastinum/Nodes: No mediastinal, hilar, or axillary adenopathy. Trachea and esophagus are unremarkable. Thyroid unremarkable. Lungs/Pleura: Calcified  granulomas in the right upper lobe. Noncalcified posterior left upper lobe nodule which appears mildly spiculated, measuring 12 mm. Severe underlying emphysema. No confluent opacities or effusions. Upper Abdomen: Imaging into the upper abdomen shows no acute findings. Musculoskeletal: Chest wall soft tissues are unremarkable. No acute bony abnormality. Review of the MIP images confirms the above findings. IMPRESSION: No evidence of pulmonary embolus. 12 mm spiculated nodule in the posterior left upper lobe, new since prior study. Cannot exclude malignancy. Recommend further evaluation with PET CT.  Old granulomatous disease. Aortic Atherosclerosis (ICD10-I70.0) and Emphysema (ICD10-J43.9). Electronically Signed   By: Rolm Baptise M.D.   On: 09/26/2019 00:06   CT ABDOMEN PELVIS W CONTRAST  Result Date: 09/26/2019 CLINICAL DATA:  Abdominal distention EXAM: CT ABDOMEN AND PELVIS WITH CONTRAST TECHNIQUE: Multidetector CT imaging of the abdomen and pelvis was performed using the standard protocol following bolus administration of intravenous contrast. CONTRAST:  44mL OMNIPAQUE IOHEXOL 350 MG/ML SOLN COMPARISON:  None. FINDINGS: Lower chest: No acute abnormality. Hepatobiliary: Enhancing area within the left hepatic lobe measures 9 mm. Favor hemangioma. Small hypodensity in the inferior liver, likely small cyst. Gallbladder unremarkable. Pancreas: No focal abnormality or ductal dilatation. Spleen: No focal abnormality.  Normal size. Adrenals/Urinary Tract: No adrenal abnormality. No focal renal abnormality. No stones or hydronephrosis. Urinary bladder is unremarkable. Stomach/Bowel: Large stool burden throughout the colon. Stomach, large and small bowel grossly unremarkable. Vascular/Lymphatic: Aortic atherosclerosis. No enlarged abdominal or pelvic lymph nodes. Reproductive: Prior hysterectomy.  No adnexal masses. Other: No free fluid or free air. Musculoskeletal: No acute bony abnormality. Prior left hip replacement.  IMPRESSION: Large stool burden throughout the colon. Aortic atherosclerosis. No acute findings. Electronically Signed   By: Rolm Baptise M.D.   On: 09/26/2019 00:08   DG Chest Portable 1 View  Result Date: 09/25/2019 CLINICAL DATA:  Shortness of breath EXAM: PORTABLE CHEST 1 VIEW COMPARISON:  July 06, 2019 FINDINGS: The heart size and mediastinal contours are within normal limits. There is hyperinflation of the upper lung zones. Aortic knob calcifications are seen. No acute osseous abnormality. IMPRESSION: No active disease. Electronically Signed   By: Prudencio Pair M.D.   On: 09/25/2019 22:17    ____________________________________________   PROCEDURES  Procedure(s) performed (including Critical Care):  Procedures   ____________________________________________   INITIAL IMPRESSION / ASSESSMENT AND PLAN / ED COURSE   KAYLIEE ATIENZA was evaluated in Emergency Department on 09/25/2019 for the symptoms described in the history of present illness. She was evaluated in the context of the global COVID-19 pandemic, which necessitated consideration that the patient might be at risk for infection with the SARS-CoV-2 virus that causes COVID-19. Institutional protocols and algorithms that pertain to the evaluation of patients at risk for COVID-19 are in a state of rapid change based on information released by regulatory bodies including the CDC and federal and state organizations. These policies and algorithms were followed during the patient's care in the ED.     Pt presents with SOB.  Possibly secondary to COPD but patient was tachycardic upon evaluation she does not have significant wheezing upon examination.  We will start off with duo nebs, steroids, chest x-ray with no obvious cause of shortness of breath will consider CT PE to rule out pulmonary embolism.  For patient's abdominal pain will get CT scan to rule out perforation, abscess, diverticulitis.  Rectal exam without any gross blood will  get hemoglobin evaluate for GI bleed.  PNA-will get xray to evaluation Anemia-CBC to evaluate ACS- will get trops Arrhythmia-Will get EKG and keep on monitor.  COVID- will get testing per algorithm.  After breathing pretreatments patient still has increased work of breathing.  We will proceed with CT PE to make sure no evidence of pulmonary embolism  Cardiac markers no signs of ACS.  Labs are otherwise very reassuring.  Patient handed off to oncoming team pending CT scans.             ____________________________________________   FINAL CLINICAL IMPRESSION(S) / ED DIAGNOSES  Final diagnoses:  COPD exacerbation ([place])  Shortness of breath  Lower abdominal pain  Constipation, unspecified constipation type  Lung nodule     MEDICATIONS GIVEN DURING THIS VISIT:  Medications  atorvastatin (LIPITOR) tablet 20 mg (20 mg Oral Given 09/26/19 0249)  escitalopram (LEXAPRO) tablet 20 mg (20 mg Oral Given 09/26/19 0911)  hydrOXYzine (ATARAX/VISTARIL) tablet 25 mg (has no administration in time range)  mirtazapine (REMERON) tablet 30 mg (30 mg Oral Given 09/26/19 0249)  perphenazine (TRILAFON) tablet 8 mg (8 mg Oral Given 09/26/19 0914)  levothyroxine (SYNTHROID) tablet 50 mcg (has no administration in time range)  levothyroxine (SYNTHROID) tablet 75 mcg (75 mcg Oral Not Given 09/26/19 0918)  docusate sodium (COLACE) capsule 100 mg (100 mg Oral Given 09/26/19 0912)  loperamide (IMODIUM) capsule 4 mg (has no administration in time range)  calcium-vitamin D (OSCAL WITH D) 500-200 MG-UNIT per tablet 1 tablet (1 tablet Oral Given 09/26/19 0913)  multivitamin with minerals tablet 1 tablet (1 tablet Oral Given 09/26/19 0912)  guaiFENesin (MUCINEX) 12 hr tablet 600 mg (600 mg Oral Given 09/26/19 0911)  chlorpheniramine-HYDROcodone (TUSSIONEX) 10-8 MG/5ML suspension 5 mL (has no administration in time range)  cefTRIAXone (ROCEPHIN) 1 g in sodium chloride 0.9 % 100 mL IVPB (0 g Intravenous Stopped  09/26/19 0444)  acetaminophen (TYLENOL) tablet 650 mg (650 mg Oral Given 09/26/19 0350)    Or  acetaminophen (TYLENOL) suppository 650 mg ( Rectal See Alternative 09/26/19 0350)  traZODone (DESYREL) tablet 25 mg (has no administration in time range)  magnesium hydroxide (MILK OF MAGNESIA) suspension 30 mL (has no administration in time range)  ondansetron (ZOFRAN) tablet 4 mg (has no administration in time range)    Or  ondansetron (ZOFRAN) injection 4 mg (has no administration in time range)  bisacodyl (DULCOLAX) EC tablet 5 mg (has no administration in time range)  lactulose (CHRONULAC) 10 GM/15ML solution 20 g (has no administration in time range)  busPIRone (BUSPAR) tablet 5 mg (5 mg Oral Given 09/26/19 0911)  LORazepam (ATIVAN) tablet 0.5 mg (0.5 mg Oral Given 09/26/19 0912)  zolpidem (AMBIEN) tablet 5 mg (has no administration in time range)  calcium carbonate (TUMS - dosed in mg elemental calcium) chewable tablet 200 mg of elemental calcium (200 mg of elemental calcium Oral Given 09/26/19 0911)  linaclotide (LINZESS) capsule 72 mcg (has no administration in time range)  baclofen (LIORESAL) tablet 5 mg (has no administration in time range)  multivitamin with minerals tablet 1 tablet (1 tablet Oral Given 09/26/19 0911)  sodium phosphate (FLEET) 7-19 GM/118ML enema 1 enema (has no administration in time range)  ipratropium-albuterol (DUONEB) 0.5-2.5 (3) MG/3ML nebulizer solution 3 mL (3 mLs Nebulization Given 09/26/19 1300)  arformoterol (BROVANA) nebulizer solution 15 mcg (15 mcg Nebulization Given 09/26/19 0913)  budesonide (PULMICORT) nebulizer solution 0.25 mg (0.25 mg Nebulization Given 09/26/19 0956)  methylPREDNISolone sodium succinate (SOLU-MEDROL) 125 mg/2 mL injection 125 mg (has no administration in time range)  methylPREDNISolone sodium succinate (SOLU-MEDROL) 125 mg/2 mL injection 60 mg (has no administration in time range)  ipratropium-albuterol (DUONEB) 0.5-2.5 (3) MG/3ML nebulizer  solution 3 mL (3 mLs Nebulization Given 09/25/19 2152)  ipratropium-albuterol (DUONEB) 0.5-2.5 (3) MG/3ML nebulizer solution 3 mL (3 mLs Nebulization Given 09/25/19 2151)  ipratropium-albuterol (DUONEB) 0.5-2.5 (3) MG/3ML nebulizer solution 3 mL (3 mLs Nebulization Given 09/25/19 2151)  methylPREDNISolone sodium succinate (SOLU-MEDROL) 125 mg/2 mL injection 125 mg (125 mg Intravenous Given 09/25/19 2220)  iohexol (OMNIPAQUE) 350 MG/ML injection 75 mL (75 mLs Intravenous Contrast  Given 09/25/19 2332)     ED Discharge Orders    None       Note:  This document was prepared using Dragon voice recognition software and may include unintentional dictation errors.   Vanessa [place], MD 09/26/19 [phone]

## 2019-09-26 ENCOUNTER — Encounter: Payer: Self-pay | Admitting: Family Medicine

## 2019-09-26 DIAGNOSIS — I252 Old myocardial infarction: Secondary | ICD-10-CM | POA: Diagnosis not present

## 2019-09-26 DIAGNOSIS — J449 Chronic obstructive pulmonary disease, unspecified: Secondary | ICD-10-CM | POA: Diagnosis not present

## 2019-09-26 DIAGNOSIS — Z91048 Other nonmedicinal substance allergy status: Secondary | ICD-10-CM | POA: Diagnosis not present

## 2019-09-26 DIAGNOSIS — K5900 Constipation, unspecified: Secondary | ICD-10-CM | POA: Diagnosis present

## 2019-09-26 DIAGNOSIS — Z885 Allergy status to narcotic agent status: Secondary | ICD-10-CM | POA: Diagnosis not present

## 2019-09-26 DIAGNOSIS — R0602 Shortness of breath: Secondary | ICD-10-CM | POA: Diagnosis present

## 2019-09-26 DIAGNOSIS — Z20822 Contact with and (suspected) exposure to covid-19: Secondary | ICD-10-CM | POA: Diagnosis present

## 2019-09-26 DIAGNOSIS — I700 Atherosclerosis of aorta: Secondary | ICD-10-CM | POA: Diagnosis present

## 2019-09-26 DIAGNOSIS — Z9981 Dependence on supplemental oxygen: Secondary | ICD-10-CM | POA: Diagnosis not present

## 2019-09-26 DIAGNOSIS — E039 Hypothyroidism, unspecified: Secondary | ICD-10-CM

## 2019-09-26 DIAGNOSIS — Z66 Do not resuscitate: Secondary | ICD-10-CM | POA: Diagnosis present

## 2019-09-26 DIAGNOSIS — Z888 Allergy status to other drugs, medicaments and biological substances status: Secondary | ICD-10-CM | POA: Diagnosis not present

## 2019-09-26 DIAGNOSIS — Z882 Allergy status to sulfonamides status: Secondary | ICD-10-CM | POA: Diagnosis not present

## 2019-09-26 DIAGNOSIS — Z7951 Long term (current) use of inhaled steroids: Secondary | ICD-10-CM | POA: Diagnosis not present

## 2019-09-26 DIAGNOSIS — Z79899 Other long term (current) drug therapy: Secondary | ICD-10-CM | POA: Diagnosis not present

## 2019-09-26 DIAGNOSIS — J441 Chronic obstructive pulmonary disease with (acute) exacerbation: Secondary | ICD-10-CM | POA: Diagnosis not present

## 2019-09-26 DIAGNOSIS — F329 Major depressive disorder, single episode, unspecified: Secondary | ICD-10-CM | POA: Diagnosis present

## 2019-09-26 DIAGNOSIS — R911 Solitary pulmonary nodule: Secondary | ICD-10-CM

## 2019-09-26 DIAGNOSIS — Z7189 Other specified counseling: Secondary | ICD-10-CM | POA: Diagnosis not present

## 2019-09-26 DIAGNOSIS — J439 Emphysema, unspecified: Secondary | ICD-10-CM | POA: Diagnosis present

## 2019-09-26 DIAGNOSIS — J9611 Chronic respiratory failure with hypoxia: Secondary | ICD-10-CM | POA: Diagnosis not present

## 2019-09-26 DIAGNOSIS — Z8249 Family history of ischemic heart disease and other diseases of the circulatory system: Secondary | ICD-10-CM | POA: Diagnosis not present

## 2019-09-26 DIAGNOSIS — Z79890 Hormone replacement therapy (postmenopausal): Secondary | ICD-10-CM | POA: Diagnosis not present

## 2019-09-26 DIAGNOSIS — F419 Anxiety disorder, unspecified: Secondary | ICD-10-CM | POA: Diagnosis present

## 2019-09-26 DIAGNOSIS — E785 Hyperlipidemia, unspecified: Secondary | ICD-10-CM | POA: Diagnosis present

## 2019-09-26 DIAGNOSIS — Z881 Allergy status to other antibiotic agents status: Secondary | ICD-10-CM | POA: Diagnosis not present

## 2019-09-26 DIAGNOSIS — J9621 Acute and chronic respiratory failure with hypoxia: Secondary | ICD-10-CM | POA: Diagnosis present

## 2019-09-26 DIAGNOSIS — Z515 Encounter for palliative care: Secondary | ICD-10-CM | POA: Diagnosis not present

## 2019-09-26 DIAGNOSIS — Z87891 Personal history of nicotine dependence: Secondary | ICD-10-CM | POA: Diagnosis not present

## 2019-09-26 LAB — URINALYSIS, COMPLETE (UACMP) WITH MICROSCOPIC
Bacteria, UA: NONE SEEN
Bilirubin Urine: NEGATIVE
Glucose, UA: NEGATIVE mg/dL
Hgb urine dipstick: NEGATIVE
Ketones, ur: 5 mg/dL — AB
Leukocytes,Ua: NEGATIVE
Nitrite: NEGATIVE
Protein, ur: NEGATIVE mg/dL
Specific Gravity, Urine: 1.035 — ABNORMAL HIGH (ref 1.005–1.030)
pH: 6 (ref 5.0–8.0)

## 2019-09-26 LAB — BASIC METABOLIC PANEL
Anion gap: 12 (ref 5–15)
BUN: 20 mg/dL (ref 8–23)
CO2: 31 mmol/L (ref 22–32)
Calcium: 8.8 mg/dL — ABNORMAL LOW (ref 8.9–10.3)
Chloride: 99 mmol/L (ref 98–111)
Creatinine, Ser: 0.85 mg/dL (ref 0.44–1.00)
GFR calc Af Amer: >60 mL/min (ref >60)
GFR calc non Af Amer: >60 mL/min (ref >60)
Glucose, Bld: 231 mg/dL — ABNORMAL HIGH (ref 70–99)
Potassium: 3.5 mmol/L (ref 3.5–5.1)
Sodium: 142 mmol/L (ref 135–145)

## 2019-09-26 LAB — CBC
HCT: 35.3 % — ABNORMAL LOW (ref 36.0–46.0)
Hemoglobin: 11.4 g/dL — ABNORMAL LOW (ref 12.0–15.0)
MCH: 31.2 pg (ref 26.0–34.0)
MCHC: 32.3 g/dL (ref 30.0–36.0)
MCV: 96.7 fL (ref 80.0–100.0)
Platelets: 191 10*3/uL (ref 150–400)
RBC: 3.65 MIL/uL — ABNORMAL LOW (ref 3.87–5.11)
RDW: 12 % (ref 11.5–15.5)
WBC: 9.9 10*3/uL (ref 4.0–10.5)
nRBC: 0 % (ref 0.0–0.2)

## 2019-09-26 LAB — SARS CORONAVIRUS 2 BY RT PCR (HOSPITAL ORDER, PERFORMED IN ~~LOC~~ HOSPITAL LAB): SARS Coronavirus 2: NEGATIVE

## 2019-09-26 LAB — LIPASE, BLOOD: Lipase: 61 U/L — ABNORMAL HIGH (ref 11–51)

## 2019-09-26 LAB — HIV ANTIBODY (ROUTINE TESTING W REFLEX): HIV Screen 4th Generation wRfx: NONREACTIVE

## 2019-09-26 LAB — TROPONIN I (HIGH SENSITIVITY): Troponin I (High Sensitivity): 24 ng/L — ABNORMAL HIGH (ref <18)

## 2019-09-26 MED ORDER — GUAIFENESIN ER 600 MG PO TB12
600.0000 mg | ORAL_TABLET | Freq: Two times a day (BID) | ORAL | Status: DC
  Administered 2019-09-26 – 2019-10-04 (×18): 600 mg via ORAL
  Filled 2019-09-26 (×19): qty 1

## 2019-09-26 MED ORDER — ONDANSETRON HCL 4 MG/2ML IJ SOLN
4.0000 mg | Freq: Four times a day (QID) | INTRAMUSCULAR | Status: DC | PRN
  Administered 2019-09-26: 4 mg via INTRAVENOUS
  Filled 2019-09-26: qty 2

## 2019-09-26 MED ORDER — LACTULOSE 10 GM/15ML PO SOLN: 20.0000 g | Freq: Two times a day (BID) | ORAL | Status: DC | PRN

## 2019-09-26 MED ORDER — CALCIUM CARBONATE ANTACID 500 MG PO CHEW
1.0000 | CHEWABLE_TABLET | Freq: Every day | ORAL | Status: DC
  Administered 2019-09-26 – 2019-10-03 (×8): 200 mg via ORAL
  Filled 2019-09-26 (×9): qty 1

## 2019-09-26 MED ORDER — ESCITALOPRAM OXALATE 10 MG PO TABS
20.0000 mg | ORAL_TABLET | Freq: Every day | ORAL | Status: DC
  Administered 2019-09-26 – 2019-10-04 (×9): 20 mg via ORAL
  Filled 2019-09-26 (×9): qty 2

## 2019-09-26 MED ORDER — HYDROCOD POLST-CPM POLST ER 10-8 MG/5ML PO SUER: 5.0000 mL | Freq: Two times a day (BID) | ORAL | Status: DC | PRN

## 2019-09-26 MED ORDER — MAGNESIUM HYDROXIDE 400 MG/5ML PO SUSP: 30.0000 mL | Freq: Every day | ORAL | Status: DC | PRN

## 2019-09-26 MED ORDER — HYDROXYZINE HCL 25 MG PO TABS
25.0000 mg | ORAL_TABLET | Freq: Four times a day (QID) | ORAL | Status: DC | PRN
  Administered 2019-09-29: 25 mg via ORAL
  Filled 2019-09-26 (×2): qty 1

## 2019-09-26 MED ORDER — LEVOTHYROXINE SODIUM 50 MCG PO TABS
75.0000 ug | ORAL_TABLET | ORAL | Status: DC
  Administered 2019-09-28 – 2019-10-03 (×3): 75 ug via ORAL
  Filled 2019-09-26 (×3): qty 1

## 2019-09-26 MED ORDER — PERPHENAZINE 4 MG PO TABS
8.0000 mg | ORAL_TABLET | Freq: Every day | ORAL | Status: DC
  Administered 2019-09-26 – 2019-10-03 (×9): 8 mg via ORAL
  Filled 2019-09-26 (×11): qty 2

## 2019-09-26 MED ORDER — MAGNESIUM HYDROXIDE 400 MG/5ML PO SUSP
30.0000 mL | Freq: Every day | ORAL | Status: DC | PRN
  Filled 2019-09-26: qty 30

## 2019-09-26 MED ORDER — IPRATROPIUM-ALBUTEROL 0.5-2.5 (3) MG/3ML IN SOLN
3.0000 mL | Freq: Four times a day (QID) | RESPIRATORY_TRACT | Status: DC
  Administered 2019-09-26: 3 mL via RESPIRATORY_TRACT
  Filled 2019-09-26: qty 3

## 2019-09-26 MED ORDER — ENOXAPARIN SODIUM 40 MG/0.4ML ~~LOC~~ SOLN
40.0000 mg | SUBCUTANEOUS | Status: DC
  Administered 2019-09-26: 40 mg via SUBCUTANEOUS
  Filled 2019-09-26: qty 0.4

## 2019-09-26 MED ORDER — TRAZODONE HCL 50 MG PO TABS
25.0000 mg | ORAL_TABLET | Freq: Every evening | ORAL | Status: DC | PRN
  Administered 2019-09-27 – 2019-10-03 (×3): 25 mg via ORAL
  Filled 2019-09-26 (×4): qty 1

## 2019-09-26 MED ORDER — IPRATROPIUM-ALBUTEROL 0.5-2.5 (3) MG/3ML IN SOLN
3.0000 mL | RESPIRATORY_TRACT | Status: DC
  Administered 2019-09-26 – 2019-10-03 (×41): 3 mL via RESPIRATORY_TRACT
  Filled 2019-09-26 (×39): qty 3

## 2019-09-26 MED ORDER — TIOTROPIUM BROMIDE MONOHYDRATE 18 MCG IN CAPS
18.0000 ug | ORAL_CAPSULE | Freq: Every day | RESPIRATORY_TRACT | Status: DC
  Filled 2019-09-26: qty 5

## 2019-09-26 MED ORDER — ACETAMINOPHEN 325 MG PO TABS
650.0000 mg | ORAL_TABLET | Freq: Four times a day (QID) | ORAL | Status: DC | PRN
  Administered 2019-09-26: 650 mg via ORAL
  Filled 2019-09-26: qty 2

## 2019-09-26 MED ORDER — BACLOFEN 10 MG PO TABS
5.0000 mg | ORAL_TABLET | Freq: Two times a day (BID) | ORAL | Status: DC | PRN
  Filled 2019-09-26: qty 0.5

## 2019-09-26 MED ORDER — MIRTAZAPINE 15 MG PO TABS
30.0000 mg | ORAL_TABLET | Freq: Every day | ORAL | Status: DC
  Administered 2019-09-26 – 2019-10-03 (×9): 30 mg via ORAL
  Filled 2019-09-26 (×9): qty 2

## 2019-09-26 MED ORDER — LINACLOTIDE 72 MCG PO CAPS
72.0000 ug | ORAL_CAPSULE | Freq: Every day | ORAL | Status: DC | PRN
  Filled 2019-09-26: qty 1

## 2019-09-26 MED ORDER — BUDESONIDE 0.25 MG/2ML IN SUSP
0.2500 mg | Freq: Two times a day (BID) | RESPIRATORY_TRACT | Status: DC
  Administered 2019-09-26 – 2019-10-04 (×17): 0.25 mg via RESPIRATORY_TRACT
  Filled 2019-09-26 (×17): qty 2

## 2019-09-26 MED ORDER — IPRATROPIUM-ALBUTEROL 0.5-2.5 (3) MG/3ML IN SOLN: 3.0000 mL | Freq: Two times a day (BID) | RESPIRATORY_TRACT | Status: DC

## 2019-09-26 MED ORDER — ATORVASTATIN CALCIUM 20 MG PO TABS
20.0000 mg | ORAL_TABLET | Freq: Every day | ORAL | Status: DC
  Administered 2019-09-26 – 2019-10-03 (×9): 20 mg via ORAL
  Filled 2019-09-26 (×9): qty 1

## 2019-09-26 MED ORDER — FLEET ENEMA 7-19 GM/118ML RE ENEM: 1.0000 | ENEMA | Freq: Every day | RECTAL | Status: DC | PRN

## 2019-09-26 MED ORDER — LORAZEPAM 0.5 MG PO TABS
0.5000 mg | ORAL_TABLET | Freq: Two times a day (BID) | ORAL | Status: DC
  Administered 2019-09-26 – 2019-10-04 (×17): 0.5 mg via ORAL
  Filled 2019-09-26 (×17): qty 1

## 2019-09-26 MED ORDER — BISACODYL 5 MG PO TBEC
5.0000 mg | DELAYED_RELEASE_TABLET | Freq: Every day | ORAL | Status: DC | PRN
  Administered 2019-09-27: 5 mg via ORAL
  Filled 2019-09-26: qty 1

## 2019-09-26 MED ORDER — ONDANSETRON 4 MG PO TBDP: 4.0000 mg | ORAL_TABLET | Freq: Three times a day (TID) | ORAL | Status: DC | PRN

## 2019-09-26 MED ORDER — DOCUSATE SODIUM 100 MG PO CAPS
100.0000 mg | ORAL_CAPSULE | Freq: Two times a day (BID) | ORAL | Status: DC
  Administered 2019-09-26 – 2019-10-04 (×17): 100 mg via ORAL
  Filled 2019-09-26 (×17): qty 1

## 2019-09-26 MED ORDER — SODIUM CHLORIDE 0.9 % IV SOLN: INTRAVENOUS | Status: DC

## 2019-09-26 MED ORDER — METHYLPREDNISOLONE SODIUM SUCC 125 MG IJ SOLR
60.0000 mg | Freq: Three times a day (TID) | INTRAMUSCULAR | Status: DC
  Administered 2019-09-26 – 2019-09-27 (×2): 60 mg via INTRAVENOUS
  Filled 2019-09-26 (×2): qty 2

## 2019-09-26 MED ORDER — ARFORMOTEROL TARTRATE 15 MCG/2ML IN NEBU
15.0000 ug | INHALATION_SOLUTION | Freq: Two times a day (BID) | RESPIRATORY_TRACT | Status: DC
  Administered 2019-09-26 – 2019-10-04 (×17): 15 ug via RESPIRATORY_TRACT
  Filled 2019-09-26 (×18): qty 2

## 2019-09-26 MED ORDER — ONDANSETRON HCL 4 MG PO TABS: 4.0000 mg | ORAL_TABLET | Freq: Four times a day (QID) | ORAL | Status: DC | PRN

## 2019-09-26 MED ORDER — ZOLPIDEM TARTRATE 5 MG PO TABS
5.0000 mg | ORAL_TABLET | Freq: Every evening | ORAL | Status: DC | PRN
  Administered 2019-09-29: 23:00:00 5 mg via ORAL
  Filled 2019-09-26: qty 1

## 2019-09-26 MED ORDER — LOPERAMIDE HCL 2 MG PO CAPS: 4.0000 mg | ORAL_CAPSULE | Freq: Every day | ORAL | Status: DC | PRN

## 2019-09-26 MED ORDER — BUSPIRONE HCL 5 MG PO TABS
5.0000 mg | ORAL_TABLET | Freq: Two times a day (BID) | ORAL | Status: DC
  Administered 2019-09-26 – 2019-10-04 (×17): 5 mg via ORAL
  Filled 2019-09-26 (×18): qty 1

## 2019-09-26 MED ORDER — METHYLPREDNISOLONE SODIUM SUCC 40 MG IJ SOLR: 40.0000 mg | Freq: Three times a day (TID) | INTRAMUSCULAR | Status: DC

## 2019-09-26 MED ORDER — ADULT MULTIVITAMIN W/MINERALS CH
1.0000 | ORAL_TABLET | Freq: Every day | ORAL | Status: DC
  Administered 2019-09-26 – 2019-09-28 (×3): 1 via ORAL
  Filled 2019-09-26 (×3): qty 1

## 2019-09-26 MED ORDER — METHYLPREDNISOLONE SODIUM SUCC 125 MG IJ SOLR
125.0000 mg | Freq: Once | INTRAMUSCULAR | Status: AC
  Administered 2019-09-26: 125 mg via INTRAVENOUS
  Filled 2019-09-26: qty 2

## 2019-09-26 MED ORDER — ACETAMINOPHEN 650 MG RE SUPP: 650.0000 mg | Freq: Four times a day (QID) | RECTAL | Status: DC | PRN

## 2019-09-26 MED ORDER — CALCIUM CARBONATE-VITAMIN D 500-200 MG-UNIT PO TABS
1.0000 | ORAL_TABLET | Freq: Every day | ORAL | Status: DC
  Administered 2019-09-26 – 2019-10-04 (×9): 1 via ORAL
  Filled 2019-09-26 (×8): qty 1

## 2019-09-26 MED ORDER — ADULT MULTIVITAMIN W/MINERALS CH
1.0000 | ORAL_TABLET | Freq: Every day | ORAL | Status: DC
  Administered 2019-09-26 – 2019-10-04 (×9): 1 via ORAL
  Filled 2019-09-26 (×9): qty 1

## 2019-09-26 MED ORDER — LEVOTHYROXINE SODIUM 50 MCG PO TABS
50.0000 ug | ORAL_TABLET | ORAL | Status: DC
  Administered 2019-09-27 – 2019-10-04 (×4): 50 ug via ORAL
  Filled 2019-09-26 (×5): qty 1

## 2019-09-26 MED ORDER — SODIUM CHLORIDE 0.9 % IV SOLN
1.0000 g | INTRAVENOUS | Status: DC
  Administered 2019-09-26 – 2019-09-30 (×5): 1 g via INTRAVENOUS
  Filled 2019-09-26: qty 1
  Filled 2019-09-26: qty 10
  Filled 2019-09-26 (×2): qty 1
  Filled 2019-09-26 (×2): qty 10

## 2019-09-26 MED ORDER — METHYLPREDNISOLONE SODIUM SUCC 125 MG IJ SOLR: 60.0000 mg | Freq: Three times a day (TID) | INTRAMUSCULAR | Status: DC

## 2019-09-26 MED ORDER — BUDESONIDE 0.25 MG/2ML IN SUSP: 2.0000 mL | Freq: Every day | RESPIRATORY_TRACT | Status: DC

## 2019-09-26 NOTE — ED Notes (Signed)
Pt up to bathroom. Pt unable to pass BM at this time.

## 2019-09-26 NOTE — ED Notes (Signed)
PT given snack and drink

## 2019-09-26 NOTE — ED Provider Notes (Signed)
-----------------------------------------   12:22 AM on 09/26/2019 -----------------------------------------  Patient remains with increased work of breathing.  Updated her on CT imaging results.  Will discuss with hospitalist services for admission.  CT chest interpreted per Dr. Ardeen Garland:  No evidence of pulmonary embolus.    12 mm spiculated nodule in the posterior left upper lobe, new since  prior study. Cannot exclude malignancy. Recommend further evaluation  with PET CT.    Old granulomatous disease.    CT abdomen/pelvis interpreted per Dr. Ardeen Garland:  Large stool burden throughout the colon.    Aortic atherosclerosis.    No acute findings.      Paulette Blanch, MD 09/26/19 [phone]

## 2019-09-26 NOTE — ED Notes (Signed)
Pt given breakfast tray

## 2019-09-26 NOTE — H&P (Signed)
[facility] NAME: Melissa George    MR#:  [id number]  DATE OF BIRTH:  08/16/1950  DATE OF ADMISSION:  09/25/2019  PRIMARY CARE PHYSICIAN: Patient, No Pcp Per   REQUESTING/REFERRING PHYSICIAN: Marjean Donna, MD CHIEF COMPLAINT:   Chief Complaint  Patient presents with  . Abdominal Pain  . Shortness of Breath    HISTORY OF PRESENT ILLNESS:  Melissa George  is a 69 y.o. Caucasian female with a known history of anxiety and COPD, who presented to the emergency room with acute onset of worsening dyspnea over the last couple weeks with significant deterioration today with no significant cough or wheezing.  He uses 2 L of O2 by nasal cannula intermittently for her COPD.  She has been having worsening lower abdominal pain with associated constipation with nausea without vomiting.  No fever or chills.  No melena however she has been noticing occasional with hard bowel movements.  She apparently admitted to chest pain to the ER physician earlier and currently is denying pain.  When she came to the ER blood pressure was 142/71 with a heart rate of 106 and pulse ox was 93% on 2 L of O2 by nasal cannula.  Labs revealed unremarkable CMP lipase level 61 BNP of 25.26 with troponin I of 11 letter 24.  CBC showed WBC 11.2. Portable chest x-ray showed no acute cardiopulmonary disease.EKG showed sinus tachycardia with rate 105 with borderline right axis deviation poor R wave progression.  Abdominal pelvic CT scan showed large stool burden throughout the colon with aortic atherosclerosis with no acute findings.  CT angiogram revealed no evidence for PE.  It showed 12 mm spiculated nodule in the posterior left upper lobe that is new since prior study with inability to exclude malignancy with recommendation of further evaluation with PET/CT.  It showed old granulomatous disease and aortic atherosclerosis and emphysema.  The patient was given 3 duo nebs as well as IV Solu-Medrol.   She will be admitted to a medically monitored bed for further evaluation and management. PAST MEDICAL HISTORY:   Past Medical History:  Diagnosis Date  . Anxiety   . Anxiety   . COPD (chronic obstructive pulmonary disease) ([place])   . 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 ABDOMINAL HYSTERECTOMY    . APPENDECTOMY    . CARDIAC CATHETERIZATION N/A 10/31/2014   Procedure: Left Heart Cath and Coronary Angiography;  Surgeon: Isaias Cowman, MD;  Location: [facility];  Service: Cardiovascular;  Laterality: N/A;  . CESAREAN SECTION    . HAND SURGERY      SOCIAL HISTORY:   Social History   Tobacco Use  . Smoking status: Former Smoker    Packs/day: 0.25    Years: 15.00    Pack years: 3.75    Types: Cigarettes    Quit date: 09/13/2014    Years since quitting: 5.0  . Smokeless tobacco: Never Used  Substance Use Topics  . Alcohol use: No    FAMILY HISTORY:   Family History  Problem Relation Age of Onset  . CAD Mother   . Thyroid cancer Other   . Lung cancer Other     DRUG ALLERGIES:   Allergies  Allergen Reactions  . Ciprofloxacin Shortness Of Breath and Itching  . Levofloxacin Other (See Comments)    Reaction:  Unknown   . Morphine And Related Nausea  And Vomiting  . Sulfa Antibiotics Hives and Itching  . Symbicort [Budesonide-Formoterol Fumarate] Other (See Comments)    Reaction:  Psychotic episode  . Advair Diskus [Fluticasone-Salmeterol] Anxiety  . Nickel Rash    REVIEW OF SYSTEMS:   ROS As per history of present illness. All pertinent systems were reviewed above. Constitutional,  HEENT, cardiovascular, respiratory, GI, GU, musculoskeletal, neuro, psychiatric, endocrine,  integumentary and hematologic systems were reviewed and are otherwise  negative/unremarkable except for positive findings mentioned above in the HPI.   MEDICATIONS AT HOME:   Prior to Admission  medications   Medication Sig Start Date End Date Taking? Authorizing Provider  acetaminophen (TYLENOL) 325 MG tablet Take 2 tablets (650 mg total) by mouth every 6 (six) hours as needed for mild pain (or Fever >/= 101). 08/10/17   Loletha Grayer, MD  albuterol (PROVENTIL HFA;VENTOLIN HFA) 108 (90 BASE) MCG/ACT inhaler Inhale 2 puffs into the lungs every 6 (six) hours as needed for wheezing or shortness of breath. 11/11/14   Hildred Priest, MD  albuterol (PROVENTIL) (2.5 MG/3ML) 0.083% nebulizer solution Take 3 mLs (2.5 mg total) by nebulization every 6 (six) hours as needed for wheezing or shortness of breath. 08/10/17   Loletha Grayer, MD  atorvastatin (LIPITOR) 20 MG tablet Take 1 tablet (20 mg total) by mouth daily at 6 PM. Patient taking differently: Take 20 mg by mouth at bedtime.  11/11/14   Hildred Priest, MD  Calcium Carbonate-Vitamin D3 (CALCIUM 600-D) 600-400 MG-UNIT TABS Take 1 tablet by mouth daily.    [provider]  docusate sodium (COLACE) 100 MG capsule Take 2 capsules (200 mg total) by mouth 2 (two) times daily. Patient taking differently: Take 100 mg by mouth 2 (two) times daily.  11/05/14   Pucilowska, Jolanta B, MD  escitalopram (LEXAPRO) 20 MG tablet Take 20 mg by mouth daily. 02/19/18   [provider]  hydrOXYzine (VISTARIL) 25 MG capsule TAKE ONE CAPSULE BY MOUTH EVERY 6 HOURS AS NEEDED 02/09/18   [provider]  levothyroxine (SYNTHROID, LEVOTHROID) 50 MCG tablet Take 50 mcg by mouth daily. On Monday, Wednesday, Friday    [provider]  levothyroxine (SYNTHROID, LEVOTHROID) 75 MCG tablet Take 75 mcg by mouth daily. On Thursday, Saturday, Sundays 02/19/18   [provider]  loperamide (IMODIUM A-D) 2 MG tablet Take 4 mg by mouth daily as needed for diarrhea or loose stools.    [provider]  magnesium hydroxide (MILK OF MAGNESIA) 400 MG/5ML suspension Take 30 mLs by mouth daily as needed for mild  constipation.    [provider]  mirtazapine (REMERON) 30 MG tablet Take 30 mg by mouth at bedtime. 02/19/18   [provider]  Multiple Vitamin (MULTIVITAMIN WITH MINERALS) TABS tablet Take 1 tablet by mouth daily. 11/11/14   Hildred Priest, MD  ondansetron (ZOFRAN ODT) 4 MG disintegrating tablet Take 1 tablet (4 mg total) by mouth every 8 (eight) hours as needed for nausea or vomiting. 07/06/19   Earleen Newport, MD  perphenazine (TRILAFON) 8 MG tablet Take 8 mg by mouth at bedtime. 02/19/18   [provider]      VITAL SIGNS:  Blood pressure (!) 133/55, pulse 94, temperature 98.5 F (36.9 C), temperature source Oral, resp. rate 19, height 5\' 2"  (1.575 m), weight 50 kg, SpO2 97 %.  PHYSICAL EXAMINATION:  Physical Exam  GENERAL:  69 y.o.-year-old Caucasian female patient lying in the bed with mild respiratory distress with conversational dyspnea. EYES: Pupils equal,  round, reactive to light and accommodation. No scleral icterus. Extraocular muscles intact.  HEENT: Head atraumatic, normocephalic. Oropharynx and nasopharynx clear.  NECK:  Supple, no jugular venous distention. No thyroid enlargement, no tenderness.  LUNGS: Diminished expiratory airflow with harsh vesicular breathing and occasional expiratory rhonchi and wheezes. CARDIOVASCULAR: Regular rate and rhythm, S1, S2 normal. No murmurs, rubs, or gallops.  ABDOMEN: Soft, nondistended, nontender. Bowel sounds present. No organomegaly or mass.  EXTREMITIES: No pedal edema, cyanosis, or clubbing.  NEUROLOGIC: Cranial nerves II through XII are intact. Muscle strength 5/5 in all extremities. Sensation intact. Gait not checked.  PSYCHIATRIC: The patient is alert and oriented x 3.  Normal affect and good eye contact. SKIN: No obvious rash, lesion, or ulcer.   LABORATORY PANEL:   CBC Recent Labs  Lab 09/25/19 2132  WBC 11.2*  HGB 11.7*  HCT 35.0*  PLT 199    ------------------------------------------------------------------------------------------------------------------  Chemistries  Recent Labs  Lab 09/25/19 2132  NA 144  K 3.9  CL 103  CO2 33*  GLUCOSE 136*  BUN 22  CREATININE 0.85  CALCIUM 9.1  AST 25  ALT 19  ALKPHOS 55  BILITOT 0.5   ------------------------------------------------------------------------------------------------------------------  Cardiac Enzymes No results for input(s): TROPONINI in the last 168 hours. ------------------------------------------------------------------------------------------------------------------  RADIOLOGY:  CT Angio Chest PE W and/or Wo Contrast  Result Date: 09/26/2019 CLINICAL DATA:  Shortness of breath EXAM: CT ANGIOGRAPHY CHEST WITH CONTRAST TECHNIQUE: Multidetector CT imaging of the chest was performed using the standard protocol during bolus administration of intravenous contrast. Multiplanar CT image reconstructions and MIPs were obtained to evaluate the vascular anatomy. CONTRAST:  18mL OMNIPAQUE IOHEXOL 350 MG/ML SOLN COMPARISON:  10/02/2014 FINDINGS: Cardiovascular: No filling defects in the pulmonary arteries to suggest pulmonary emboli. Heart is normal size. Aorta is normal caliber. Scattered aortic atherosclerosis. Mediastinum/Nodes: No mediastinal, hilar, or axillary adenopathy. Trachea and esophagus are unremarkable. Thyroid unremarkable. Lungs/Pleura: Calcified granulomas in the right upper lobe. Noncalcified posterior left upper lobe nodule which appears mildly spiculated, measuring 12 mm. Severe underlying emphysema. No confluent opacities or effusions. Upper Abdomen: Imaging into the upper abdomen shows no acute findings. Musculoskeletal: Chest wall soft tissues are unremarkable. No acute bony abnormality. Review of the MIP images confirms the above findings. IMPRESSION: No evidence of pulmonary embolus. 12 mm spiculated nodule in the posterior left upper lobe, new since  prior study. Cannot exclude malignancy. Recommend further evaluation with PET CT. Old granulomatous disease. Aortic Atherosclerosis (ICD10-I70.0) and Emphysema (ICD10-J43.9). Electronically Signed   By: Rolm Baptise M.D.   On: 09/26/2019 00:06   CT ABDOMEN PELVIS W CONTRAST  Result Date: 09/26/2019 CLINICAL DATA:  Abdominal distention EXAM: CT ABDOMEN AND PELVIS WITH CONTRAST TECHNIQUE: Multidetector CT imaging of the abdomen and pelvis was performed using the standard protocol following bolus administration of intravenous contrast. CONTRAST:  28mL OMNIPAQUE IOHEXOL 350 MG/ML SOLN COMPARISON:  None. FINDINGS: Lower chest: No acute abnormality. Hepatobiliary: Enhancing area within the left hepatic lobe measures 9 mm. Favor hemangioma. Small hypodensity in the inferior liver, likely small cyst. Gallbladder unremarkable. Pancreas: No focal abnormality or ductal dilatation. Spleen: No focal abnormality.  Normal size. Adrenals/Urinary Tract: No adrenal abnormality. No focal renal abnormality. No stones or hydronephrosis. Urinary bladder is unremarkable. Stomach/Bowel: Large stool burden throughout the colon. Stomach, large and small bowel grossly unremarkable. Vascular/Lymphatic: Aortic atherosclerosis. No enlarged abdominal or pelvic lymph nodes. Reproductive: Prior hysterectomy.  No adnexal masses. Other: No free fluid or free air. Musculoskeletal: No acute bony abnormality. Prior left hip  replacement. IMPRESSION: Large stool burden throughout the colon. Aortic atherosclerosis. No acute findings. Electronically Signed   By: Rolm Baptise M.D.   On: 09/26/2019 00:08   DG Chest Portable 1 View  Result Date: 09/25/2019 CLINICAL DATA:  Shortness of breath EXAM: PORTABLE CHEST 1 VIEW COMPARISON:  July 06, 2019 FINDINGS: The heart size and mediastinal contours are within normal limits. There is hyperinflation of the upper lung zones. Aortic knob calcifications are seen. No acute osseous abnormality. IMPRESSION: No  active disease. Electronically Signed   By: Prudencio Pair M.D.   On: 09/25/2019 22:17      IMPRESSION AND PLAN:   1.  COPD acute exacerbation. -The patient will be admitted to medical monitored bed. -We will continue steroid therapy with IV Solu-Medrol. -We will continue nebulized bronchodilators with DuoNebs 4 times daily and every 4 hours as needed. -We will add antibiotic therapy with IV Rocephin. -Mucolytic therapy will be provided. -Spiriva will be continued.  2.  Hypothyroidism. -We will continue Synthroid and check TSH level.  3.  Constipation. -Medications for constipation were ordered including Fleet Enema.  4..  Left lung nodule. -This will need further evaluation with PET scan which can be done on outpatient basis.Marland Kitchen  5.  Dyslipidemia. -We will continue statin therapy.  6. Depression and anxiety. -We will continue Lexapro and as needed Xanax.  7.  DVT prophylaxis. -SCD and encourage ambulation   All the records are reviewed and case discussed with ED provider. The plan of care was discussed in details with the patient (and family). I answered all questions. The patient agreed to proceed with the above mentioned plan. Further management will depend upon hospital course.   CODE STATUS: Full code  Status is: Inpatient  Remains inpatient appropriate because:Ongoing diagnostic testing needed not appropriate for outpatient work up, Unsafe d/c plan, IV treatments appropriate due to intensity of illness or inability to take PO and Inpatient level of care appropriate due to severity of illness   Dispo: The patient is from: Home              Anticipated d/c is to: Home              Anticipated d/c date is: 2 days              Patient currently is not medically stable to d/c.   TOTAL TIME TAKING CARE OF THIS PATIENT: 55 minutes.    Christel Mormon M.D on 09/26/2019 at 2:35 AM  [facility]   From 7 PM-7 AM, contact night-coverage [email or web address]  CC: Primary  care physician; Patient, No Pcp Per   Note: This dictation was prepared with Dragon dictation along with smaller phrase technology. Any transcriptional typo errors that result from this process are unintentional.

## 2019-09-26 NOTE — Progress Notes (Signed)
Brief hospitalist update note.  This is a nonbillable note.  Please see same-day H&P for full billable details.  Briefly, this is a 69 year old female with known history of anxiety and COPD who presented to the emergency room for acute onset of dyspnea.  Patient reports shortness of breath over the last several weeks with deterioration on the day of discharge.  Patient uses 2 to 3L nasal cannula at baseline.  On my evaluation today patient is not audibly wheezing however she is doing notable pursed lip breathing and lung sounds are diminished diffusely.  Plan: Increase Solu-Medrol to 60 mg IV every 8 hours Scheduled DuoNebs every 4 hours around-the-clock [place] twice daily Pulmicort twice daily Supplemental oxygen, wean as tolerated  Remainder of assessment and plan per H&P  Ralene Muskrat MD

## 2019-09-26 NOTE — ED Notes (Signed)
Pt ambulated to bathroom with 1 staff assistance EDT [place]. Pt placed back on stretcher safely oxygen in place.

## 2019-09-26 NOTE — ED Notes (Signed)
Pt ambulatory to toilet with this RN assistance.

## 2019-09-27 DIAGNOSIS — J9621 Acute and chronic respiratory failure with hypoxia: Secondary | ICD-10-CM

## 2019-09-27 MED ORDER — METHYLPREDNISOLONE SODIUM SUCC 40 MG IJ SOLR
40.0000 mg | Freq: Three times a day (TID) | INTRAMUSCULAR | Status: DC
  Administered 2019-09-27 – 2019-09-28 (×3): 40 mg via INTRAVENOUS
  Filled 2019-09-27 (×3): qty 1

## 2019-09-27 NOTE — Progress Notes (Addendum)
PROGRESS NOTE    Melissa George  ESP:[id number] DOB: [birth year] DOA: 09/25/2019 PCP: Patient, No Pcp Per   Brief Narrative:  69 year old female with known history of anxiety and COPD who presented to the emergency room for acute onset of dyspnea.  Patient reports shortness of breath over the last several weeks with deterioration on the day of discharge.  Patient uses 2 to 3L nasal cannula at baseline.  On my evaluation today patient is not audibly wheezing however she is doing notable pursed lip breathing and lung sounds are diminished diffusely.  6/18: Patient seen and examined.  Remains on 2 to 3 L supplemental oxygen.  Still with significantly  diminished breath sounds bilaterally and pursed lip breathing associated with tachypnea.  May also be an underlying anxiety factor driving this.   Assessment & Plan:   Active Problems:   COPD exacerbation ([place])  Acute exacerbation of COPD Acute on chronic hypoxic respiratory failure secondary to above Patient still with significant tachypnea and pursed lip breathing Diffusely diminished breath sounds bilaterally Plan: Continue IV steroids, Solu-Medrol 40 mg every 8 hours Continue bronchodilator regimen Continue empiric antibiotics MetaNeb every 4 hours Pulmonary consultation requested, recommendations appreciated really  Left lung nodule 12 mm spiculated nodule noted on CT chest Will need PET/CT on outpatient basis  Hypothyroidism Continue Synthroid  Dyslipidemia On statin  Depression/anxiety Continue home Lexapro As needed Xanax   DVT prophylaxis: SCD Code Status: DNR Family Communication: Desiree Hane via phone [phone] Disposition Plan: Status is: Inpatient  Remains inpatient appropriate because:Inpatient level of care appropriate due to severity of illness   Dispo: The patient is from: Home              Anticipated d/c is to: Home              Anticipated d/c date is: 3 days              Patient currently is  not medically stable to d/c.         Consultants:   Pulmonary  Procedures:   none  Antimicrobials:   Rocephin    Subjective: Patient seen and examined.  Remains on 2 to 3 L supplemental oxygen.  Still significantly dyspneic with pursed lip breathing.  Objective: Vitals:   09/27/19 0200 09/27/19 0412 09/27/19 0723 09/27/19 0755  BP: (!) 93/54 108/61  (!) 121/48  Pulse: 92 84  98  Resp: 15 18  18   Temp: 98.7 F (37.1 C) 98.3 F (36.8 C)  98.1 F (36.7 C)  TempSrc: Oral Oral  Oral  SpO2: 94% 95% 94% 98%  Weight:      Height:        Intake/Output Summary (Last 24 hours) at 09/27/2019 1051 Last data filed at 09/27/2019 0339 Gross per 24 hour  Intake 100 ml  Output --  Net 100 ml   Filed Weights   09/25/19 2125 09/26/19 0831  Weight: 50 kg 50 kg    Examination:  General exam: Appears calm and comfortable  Respiratory system: Diffusely diminished breath sounds bilaterally.  Tachypneic.  Pursed lip breathing noted. Cardiovascular system: S1 & S2 heard, RRR. No JVD, murmurs, rubs, gallops or clicks. No pedal edema. Gastrointestinal system: Abdomen is nondistended, soft and nontender. No organomegaly or masses felt. Normal bowel sounds heard. Central nervous system: Alert and oriented. No focal neurological deficits. Extremities: Symmetric 5 x 5 power. Skin: No rashes, lesions or ulcers Psychiatry: Judgement and insight appear normal. Mood & affect appropriate.  Data Reviewed: I have personally reviewed following labs and imaging studies  CBC: Recent Labs  Lab 09/25/19 2132 09/26/19 0554  WBC 11.2* 9.9  NEUTROABS 9.3*  --   HGB 11.7* 11.4*  HCT 35.0* 35.3*  MCV 93.3 96.7  PLT 199 161   Basic Metabolic Panel: Recent Labs  Lab 09/25/19 2132 09/26/19 0554  NA 144 142  K 3.9 3.5  CL 103 99  CO2 33* 31  GLUCOSE 136* 231*  BUN 22 20  CREATININE 0.85 0.85  CALCIUM 9.1 8.8*   GFR: Estimated Creatinine Clearance: 48.6 mL/min (by C-G formula  based on SCr of 0.85 mg/dL). Liver Function Tests: Recent Labs  Lab 09/25/19 2132  AST 25  ALT 19  ALKPHOS 55  BILITOT 0.5  PROT 6.8  ALBUMIN 3.9   Recent Labs  Lab 09/25/19 2332  LIPASE 61*   No results for input(s): AMMONIA in the last 168 hours. Coagulation Profile: Recent Labs  Lab 09/25/19 2132  INR 0.9   Cardiac Enzymes: No results for input(s): CKTOTAL, CKMB, CKMBINDEX, TROPONINI in the last 168 hours. BNP (last 3 results) No results for input(s): PROBNP in the last 8760 hours. HbA1C: No results for input(s): HGBA1C in the last 72 hours. CBG: No results for input(s): GLUCAP in the last 168 hours. Lipid Profile: No results for input(s): CHOL, HDL, LDLCALC, TRIG, CHOLHDL, LDLDIRECT in the last 72 hours. Thyroid Function Tests: No results for input(s): TSH, T4TOTAL, FREET4, T3FREE, THYROIDAB in the last 72 hours. Anemia Panel: No results for input(s): VITAMINB12, FOLATE, FERRITIN, TIBC, IRON, RETICCTPCT in the last 72 hours. Sepsis Labs: No results for input(s): PROCALCITON, LATICACIDVEN in the last 168 hours.  Recent Results (from the past 240 hour(s))  SARS Coronavirus 2 by RT PCR (hospital order, performed in [facility] hospital lab) Nasopharyngeal Nasopharyngeal Swab     Status: None   Collection Time: 09/25/19  9:32 PM   Specimen: Nasopharyngeal Swab  Result Value Ref Range Status   SARS Coronavirus 2 NEGATIVE NEGATIVE Final    Comment: (NOTE) SARS-CoV-2 target nucleic acids are NOT DETECTED.  The SARS-CoV-2 RNA is generally detectable in upper and lower respiratory specimens during the acute phase of infection. The lowest concentration of SARS-CoV-2 viral copies this assay can detect is 250 copies / mL. A negative result does not preclude SARS-CoV-2 infection and should not be used as the sole basis for treatment or other patient management decisions.  A negative result may occur with improper specimen collection / handling, submission of specimen  other than nasopharyngeal swab, presence of viral mutation(s) within the areas targeted by this assay, and inadequate number of viral copies (<250 copies / mL). A negative result must be combined with clinical observations, patient history, and epidemiological information.  Fact Sheet for Patients:   StrictlyIdeas.no  Fact Sheet for Healthcare Providers: BankingDealers.co.za  This test is not yet approved or  cleared by the [place] FDA and has been authorized for detection and/or diagnosis of SARS-CoV-2 by FDA under an Emergency Use Authorization (EUA).  This EUA will remain in effect (meaning this test can be used) for the duration of the COVID-19 declaration under Section 564(b)(1) of the Act, 21 U.S.C. section 360bbb-3(b)(1), unless the authorization is terminated or revoked sooner.  Performed at [facility], [address], [place], [place] [address]          Radiology Studies: CT Angio Chest PE W and/or Wo Contrast  Result Date: 09/26/2019 CLINICAL DATA:  Shortness of  breath EXAM: CT ANGIOGRAPHY CHEST WITH CONTRAST TECHNIQUE: Multidetector CT imaging of the chest was performed using the standard protocol during bolus administration of intravenous contrast. Multiplanar CT image reconstructions and MIPs were obtained to evaluate the vascular anatomy. CONTRAST:  77mL OMNIPAQUE IOHEXOL 350 MG/ML SOLN COMPARISON:  10/02/2014 FINDINGS: Cardiovascular: No filling defects in the pulmonary arteries to suggest pulmonary emboli. Heart is normal size. Aorta is normal caliber. Scattered aortic atherosclerosis. Mediastinum/Nodes: No mediastinal, hilar, or axillary adenopathy. Trachea and esophagus are unremarkable. Thyroid unremarkable. Lungs/Pleura: Calcified granulomas in the right upper lobe. Noncalcified posterior left upper lobe nodule which appears mildly spiculated, measuring 12 mm. Severe underlying emphysema. No confluent  opacities or effusions. Upper Abdomen: Imaging into the upper abdomen shows no acute findings. Musculoskeletal: Chest wall soft tissues are unremarkable. No acute bony abnormality. Review of the MIP images confirms the above findings. IMPRESSION: No evidence of pulmonary embolus. 12 mm spiculated nodule in the posterior left upper lobe, new since prior study. Cannot exclude malignancy. Recommend further evaluation with PET CT. Old granulomatous disease. Aortic Atherosclerosis (ICD10-I70.0) and Emphysema (ICD10-J43.9). Electronically Signed   By: Rolm Baptise M.D.   On: 09/26/2019 00:06   CT ABDOMEN PELVIS W CONTRAST  Result Date: 09/26/2019 CLINICAL DATA:  Abdominal distention EXAM: CT ABDOMEN AND PELVIS WITH CONTRAST TECHNIQUE: Multidetector CT imaging of the abdomen and pelvis was performed using the standard protocol following bolus administration of intravenous contrast. CONTRAST:  81mL OMNIPAQUE IOHEXOL 350 MG/ML SOLN COMPARISON:  None. FINDINGS: Lower chest: No acute abnormality. Hepatobiliary: Enhancing area within the left hepatic lobe measures 9 mm. Favor hemangioma. Small hypodensity in the inferior liver, likely small cyst. Gallbladder unremarkable. Pancreas: No focal abnormality or ductal dilatation. Spleen: No focal abnormality.  Normal size. Adrenals/Urinary Tract: No adrenal abnormality. No focal renal abnormality. No stones or hydronephrosis. Urinary bladder is unremarkable. Stomach/Bowel: Large stool burden throughout the colon. Stomach, large and small bowel grossly unremarkable. Vascular/Lymphatic: Aortic atherosclerosis. No enlarged abdominal or pelvic lymph nodes. Reproductive: Prior hysterectomy.  No adnexal masses. Other: No free fluid or free air. Musculoskeletal: No acute bony abnormality. Prior left hip replacement. IMPRESSION: Large stool burden throughout the colon. Aortic atherosclerosis. No acute findings. Electronically Signed   By: Rolm Baptise M.D.   On: 09/26/2019 00:08   DG  Chest Portable 1 View  Result Date: 09/25/2019 CLINICAL DATA:  Shortness of breath EXAM: PORTABLE CHEST 1 VIEW COMPARISON:  July 06, 2019 FINDINGS: The heart size and mediastinal contours are within normal limits. There is hyperinflation of the upper lung zones. Aortic knob calcifications are seen. No acute osseous abnormality. IMPRESSION: No active disease. Electronically Signed   By: Prudencio Pair M.D.   On: 09/25/2019 22:17        Scheduled Meds: . arformoterol  15 mcg Nebulization BID  . atorvastatin  20 mg Oral QHS  . budesonide (PULMICORT) nebulizer solution  0.25 mg Nebulization BID  . busPIRone  5 mg Oral BID  . calcium carbonate  1 tablet Oral Daily  . calcium-vitamin D  1 tablet Oral Daily  . docusate sodium  100 mg Oral BID  . escitalopram  20 mg Oral Daily  . guaiFENesin  600 mg Oral BID  . ipratropium-albuterol  3 mL Nebulization Q4H  . levothyroxine  50 mcg Oral Q M,W,F  . levothyroxine  75 mcg Oral Once per day on Sun Thu Sat  . LORazepam  0.5 mg Oral BID  . methylPREDNISolone (SOLU-MEDROL) injection  40 mg Intravenous Q8H  .  mirtazapine  30 mg Oral QHS  . multivitamin with minerals  1 tablet Oral Daily  . multivitamin with minerals  1 tablet Oral Daily  . perphenazine  8 mg Oral QHS   Continuous Infusions: . cefTRIAXone (ROCEPHIN)  IV Stopped (09/27/19 0219)     LOS: 1 day    Time spent: 35 minutes    Sidney Ace, MD [facility] Pager [phone]  If 7PM-7AM, please contact night-coverage 09/27/2019, 10:51 AM

## 2019-09-28 DIAGNOSIS — J9611 Chronic respiratory failure with hypoxia: Secondary | ICD-10-CM

## 2019-09-28 DIAGNOSIS — J449 Chronic obstructive pulmonary disease, unspecified: Secondary | ICD-10-CM

## 2019-09-28 DIAGNOSIS — Z9981 Dependence on supplemental oxygen: Secondary | ICD-10-CM

## 2019-09-28 LAB — BASIC METABOLIC PANEL
Anion gap: 12 (ref 5–15)
BUN: 31 mg/dL — ABNORMAL HIGH (ref 8–23)
CO2: 30 mmol/L (ref 22–32)
Calcium: 9.2 mg/dL (ref 8.9–10.3)
Chloride: 101 mmol/L (ref 98–111)
Creatinine, Ser: 0.68 mg/dL (ref 0.44–1.00)
GFR calc Af Amer: >60 mL/min (ref >60)
GFR calc non Af Amer: >60 mL/min (ref >60)
Glucose, Bld: 162 mg/dL — ABNORMAL HIGH (ref 70–99)
Potassium: 4.5 mmol/L (ref 3.5–5.1)
Sodium: 143 mmol/L (ref 135–145)

## 2019-09-28 LAB — CBC WITH DIFFERENTIAL/PLATELET
Abs Immature Granulocytes: 0.19 10*3/uL — ABNORMAL HIGH (ref 0.00–0.07)
Basophils Absolute: 0 10*3/uL (ref 0.0–0.1)
Basophils Relative: 0 %
Eosinophils Absolute: 0 10*3/uL (ref 0.0–0.5)
Eosinophils Relative: 0 %
HCT: 37.7 % (ref 36.0–46.0)
Hemoglobin: 12.4 g/dL (ref 12.0–15.0)
Immature Granulocytes: 2 %
Lymphocytes Relative: 6 %
Lymphs Abs: 0.7 10*3/uL (ref 0.7–4.0)
MCH: 31.3 pg (ref 26.0–34.0)
MCHC: 32.9 g/dL (ref 30.0–36.0)
MCV: 95.2 fL (ref 80.0–100.0)
Monocytes Absolute: 0.3 10*3/uL (ref 0.1–1.0)
Monocytes Relative: 3 %
Neutro Abs: 9.7 10*3/uL — ABNORMAL HIGH (ref 1.7–7.7)
Neutrophils Relative %: 89 %
Platelets: 249 10*3/uL (ref 150–400)
RBC: 3.96 MIL/uL (ref 3.87–5.11)
RDW: 12.6 % (ref 11.5–15.5)
WBC: 11 10*3/uL — ABNORMAL HIGH (ref 4.0–10.5)
nRBC: 0 % (ref 0.0–0.2)

## 2019-09-28 MED ORDER — THEOPHYLLINE ER 100 MG PO TB12: 100.0000 mg | ORAL_TABLET | Freq: Two times a day (BID) | ORAL | Status: DC

## 2019-09-28 MED ORDER — TIOTROPIUM BROMIDE MONOHYDRATE 18 MCG IN CAPS
18.0000 ug | ORAL_CAPSULE | Freq: Every day | RESPIRATORY_TRACT | Status: DC
  Administered 2019-09-29 – 2019-10-04 (×6): 18 ug via RESPIRATORY_TRACT
  Filled 2019-09-28: qty 5

## 2019-09-28 MED ORDER — METHYLPREDNISOLONE SODIUM SUCC 125 MG IJ SOLR
60.0000 mg | Freq: Three times a day (TID) | INTRAMUSCULAR | Status: DC
  Administered 2019-09-28 – 2019-09-29 (×3): 60 mg via INTRAVENOUS
  Filled 2019-09-28 (×3): qty 2

## 2019-09-28 MED ORDER — THEOPHYLLINE ER 100 MG PO CP24
100.0000 mg | ORAL_CAPSULE | Freq: Every day | ORAL | Status: DC
  Administered 2019-09-29 – 2019-10-03 (×6): 100 mg via ORAL
  Filled 2019-09-28 (×7): qty 1

## 2019-09-28 MED ORDER — ROFLUMILAST 500 MCG PO TABS
250.0000 ug | ORAL_TABLET | Freq: Every day | ORAL | Status: DC
  Administered 2019-09-29 – 2019-10-04 (×6): 250 ug via ORAL
  Filled 2019-09-28 (×6): qty 1

## 2019-09-28 NOTE — Consult Note (Signed)
Pulmonary Critical Care  Initial Consult Note  [name] HTD:[id number] DOB: [birth year] DOA: 09/25/2019  Referring physician: Dr. Priscella Mann  Chief Complaint: COPD  HPI: Melissa George is a 69 y.o. female with prior history of COPD she is having actually rather vague about her history apparently was living in [place] and was getting her care there.  Currently she now resides in a group home and she presented to the hospital with increasing shortness of breath.  She states that normally she uses about 2 L of oxygen at home for her COPD.  She states that she currently does not smoke however she has noticed that her symptoms have been getting worse over the last unknown timeframe.  She denies having any hemoptysis denies having any chest pain.  When she came into the hospital her saturations were 93%.  She is able to complete sentences.  Does not appear to be in severe respiratory distress.  The concern here is that she is not turning around quick enough.  She has been on steroids she has been on antibiotics.  Currently she is on Pulmicort as well as Brovana by nebulizer.  She is also duo nebs.  She has been receiving ceftriaxone.  Review of Systems:  Constitutional:  +weight loss, night sweats, Fevers, chills, +fatigue.  HEENT:  No headaches, nasal congestion, post nasal drip,  Cardio-vascular:  No chest pain, Orthopnea, PND, swelling in lower extremities, anasarca, dizziness, palpitations  GI:  No heartburn, indigestion, abdominal pain, nausea, vomiting, diarrhea  Resp:  +shortness of breath with exertion or at rest. +productive cough, No coughing up of blood.No wheezing Skin:  no rash or lesions.  Musculoskeletal:  No joint pain or swelling.   Remainder ROS performed and is unremarkable other than noted in HPI  Past Medical History:  Diagnosis Date  . Anxiety   . Anxiety   . COPD (chronic obstructive pulmonary disease) ([place])   . 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 ABDOMINAL HYSTERECTOMY    . APPENDECTOMY    . CARDIAC CATHETERIZATION N/A 10/31/2014   Procedure: Left Heart Cath and Coronary Angiography;  Surgeon: Isaias Cowman, MD;  Location: [facility];  Service: Cardiovascular;  Laterality: N/A;  . CESAREAN SECTION    . HAND SURGERY     Social History:  reports that she quit smoking about 5 years ago. Her smoking use included cigarettes. She has a 3.75 pack-year smoking history. She has never used smokeless tobacco. She reports that she does not drink alcohol and does not use drugs.  Allergies  Allergen Reactions  . Ciprofloxacin Shortness Of Breath and Itching  . Levofloxacin Other (See Comments)    Reaction:  Unknown   . Morphine And Related Nausea And Vomiting  . Sulfa Antibiotics Hives and Itching  . Symbicort [Budesonide-Formoterol Fumarate] Other (See Comments)    Reaction:  Psychotic episode  . Advair Diskus [Fluticasone-Salmeterol] Anxiety  . Nickel Rash    Family History  Problem Relation Age of Onset  . CAD Mother   . Thyroid cancer Other   . Lung cancer Other     Prior to Admission medications   Medication Sig Start Date End Date Taking? Authorizing Provider  acetaminophen (TYLENOL) 325 MG tablet Take 2 tablets (650 mg total) by mouth every 6 (six) hours as needed for mild pain (or Fever >/= 101). 08/10/17  Yes Loletha Grayer, MD  albuterol (PROVENTIL HFA;VENTOLIN HFA) 108 (90 BASE)  MCG/ACT inhaler Inhale 2 puffs into the lungs every 6 (six) hours as needed for wheezing or shortness of breath. Patient taking differently: Inhale 2 puffs into the lungs every 4 (four) hours as needed for wheezing or shortness of breath.  11/11/14  Yes Hildred Priest, MD  arformoterol (BROVANA) 15 MCG/2ML NEBU Take 15 mcg by nebulization 2 (two) times daily.   Yes [provider]  atorvastatin (LIPITOR) 20 MG tablet Take 1 tablet (20 mg total) by mouth daily at 6 PM. Patient  taking differently: Take 20 mg by mouth at bedtime.  11/11/14  Yes Hildred Priest, MD  baclofen (LIORESAL) 10 MG tablet Take 5 mg by mouth every 12 (twelve) hours as needed for muscle spasms.   Yes [provider]  busPIRone (BUSPAR) 5 MG tablet Take 5 mg by mouth 2 (two) times daily.   Yes [provider]  calcium carbonate (TUMS - DOSED IN MG ELEMENTAL CALCIUM) 500 MG chewable tablet Chew 1 tablet by mouth daily.   Yes [provider]  docusate sodium (COLACE) 100 MG capsule Take 2 capsules (200 mg total) by mouth 2 (two) times daily. Patient taking differently: Take 100 mg by mouth 2 (two) times daily.  11/05/14  Yes Pucilowska, Jolanta B, MD  escitalopram (LEXAPRO) 20 MG tablet Take 20 mg by mouth daily. 02/19/18  Yes [provider]  Fluticasone Furoate (ARNUITY ELLIPTA) 100 MCG/ACT AEPB Inhale 1 puff into the lungs daily.   Yes [provider]  hydrOXYzine (VISTARIL) 25 MG capsule TAKE ONE CAPSULE BY MOUTH EVERY 6 HOURS AS NEEDED 02/09/18  Yes [provider]  ipratropium-albuterol (DUONEB) 0.5-2.5 (3) MG/3ML SOLN Take 3 mLs by nebulization 2 (two) times daily.   Yes [provider]  ipratropium-albuterol (DUONEB) 0.5-2.5 (3) MG/3ML SOLN Take 3 mLs by nebulization 2 (two) times daily as needed.   Yes [provider]  levothyroxine (SYNTHROID, LEVOTHROID) 50 MCG tablet Take 50 mcg by mouth daily. On Monday, Wednesday, Friday   Yes [provider]  levothyroxine (SYNTHROID, LEVOTHROID) 75 MCG tablet Take 75 mcg by mouth daily. On Thursday, Saturday, Sundays 02/19/18  Yes [provider]  linaclotide (LINZESS) 72 MCG capsule Take 72 mcg by mouth daily as needed.   Yes [provider]  loperamide (IMODIUM A-D) 2 MG tablet Take 4 mg by mouth daily as needed for diarrhea or loose stools.   Yes [provider]  LORazepam (ATIVAN) 0.5 MG tablet Take 0.5 mg by mouth 2 (two) times daily.    Yes [provider]  magnesium hydroxide (MILK OF MAGNESIA) 400 MG/5ML suspension Take 30 mLs by mouth daily as needed for mild constipation.   Yes [provider]  meloxicam (MOBIC) 7.5 MG tablet Take 7.5 mg by mouth 2 (two) times daily.   Yes [provider]  mirtazapine (REMERON) 30 MG tablet Take 30 mg by mouth at bedtime. 02/19/18  Yes [provider]  Multiple Vitamins-Minerals (THEREMS-M) TABS Take 1 tablet by mouth daily.   Yes [provider]  ondansetron (ZOFRAN ODT) 4 MG disintegrating tablet Take 1 tablet (4 mg total) by mouth every 8 (eight) hours as needed for nausea or vomiting. 07/06/19  Yes Earleen Newport, MD  perphenazine (TRILAFON) 8 MG tablet Take 8 mg by mouth at bedtime. 02/19/18  Yes [provider]  Tiotropium Bromide Monohydrate (SPIRIVA RESPIMAT) 2.5 MCG/ACT AERS Inhale 2 puffs into the lungs daily.   Yes [provider]  Vitamin D, Cholecalciferol, 10  MCG (400 UNIT) TABS Take 400 Units by mouth daily.   Yes [provider]  Wheat Dextrin (GNP BEST FIBER) POWD Take 30 mLs by mouth See admin instructions. Mix and drink 2 teaspoons in 4 to 8 ounces of liquid and drink once daily.   Yes [provider]  zolpidem (AMBIEN) 5 MG tablet Take 5 mg by mouth at bedtime as needed for sleep.   Yes [provider]   Physical Exam: Vitals:   09/28/19 0909 09/28/19 1000 09/28/19 1200 09/28/19 1600  BP: 129/72 113/68 (!) 131/57 (!) 148/31  Pulse: (!) 104 85 94 82  Resp: 19 18 19  (!) 22  Temp: 98 F (36.7 C) 97.9 F (36.6 C) 98.1 F (36.7 C) 97.9 F (36.6 C)  TempSrc: Oral Oral Oral Oral  SpO2: 97% 96% 100% 100%  Weight:      Height:        Wt Readings from Last 3 Encounters:  09/26/19 50 kg  07/06/19 49.9 kg  06/30/18 46.3 kg    General:  Appears calm and comfortable Eyes: PERRL, normal lids, irises & conjunctiva ENT: grossly normal hearing, lips & tongue Neck: no LAD, masses  or thyromegaly Cardiovascular: RRR, no m/r/g. No LE edema. Respiratory: CTA bilaterally, no w/r/r.       Normal respiratory effort. Abdomen: soft, nontender Skin: no rash or induration seen on limited exam Musculoskeletal: grossly normal tone BUE/BLE Psychiatric: grossly normal mood and affect Neurologic: grossly non-focal.          Labs on Admission:  Basic Metabolic Panel: Recent Labs  Lab 09/25/19 2132 09/26/19 0554 09/28/19 0837  NA 144 142 143  K 3.9 3.5 4.5  CL 103 99 101  CO2 33* 31 30  GLUCOSE 136* 231* 162*  BUN 22 20 31*  CREATININE 0.85 0.85 0.68  CALCIUM 9.1 8.8* 9.2   Liver Function Tests: Recent Labs  Lab 09/25/19 2132  AST 25  ALT 19  ALKPHOS 55  BILITOT 0.5  PROT 6.8  ALBUMIN 3.9   Recent Labs  Lab 09/25/19 2332  LIPASE 61*   No results for input(s): AMMONIA in the last 168 hours. CBC: Recent Labs  Lab 09/25/19 2132 09/26/19 0554 09/28/19 0837  WBC 11.2* 9.9 11.0*  NEUTROABS 9.3*  --  9.7*  HGB 11.7* 11.4* 12.4  HCT 35.0* 35.3* 37.7  MCV 93.3 96.7 95.2  PLT 199 191 249   Cardiac Enzymes: No results for input(s): CKTOTAL, CKMB, CKMBINDEX, TROPONINI in the last 168 hours.  BNP (last 3 results) Recent Labs    09/25/19 2132  BNP 25.2    ProBNP (last 3 results) No results for input(s): PROBNP in the last 8760 hours.  CBG: No results for input(s): GLUCAP in the last 168 hours.  Radiological Exams on Admission: No results found.  EKG: Independently reviewed.  Assessment/Plan Active Problems:   COPD exacerbation ([place])   1. Severe COPD based on history she currently is on Brovana as well as corticosteroids.  The patient also is getting duo nebs at this time.  Still slow to improve.  Currently however she actually looks fairly comfortable I do not know the degree of severity of her COPD and if she is able to follow-up in the office I would like to see her so that we can further assess.  In the meantime I would consider addition  of low-dose theophylline and also would recommend starting on Spiriva acting anticholinergic.  I think this will likely help to some  degree.  In addition for anti-inflammatory inflammatory properties azithromycin could be tried.  On the patient's chest x-ray there does not appear to be any active trait or pneumonia noted with hyperinflation. 2. Chronic respiratory failure with hypoxia patient has chronic oxygen dependent on 2 L with saturations right now are acceptable we will continue to monitor closely. 3. Oxygen dependence as above she should continue with oxygen therapy. 4. Palliative care should be considered to evaluate this patient for management  Code Status: Full code  Family Communication: No family present Disposition Plan: Group home  Time spent: 70 minutes  I have personally obtained a history, examined the patient, evaluated laboratory and imaging results, formulated the assessment and plan and placed orders.  The Patient requires high complexity decision making for assessment and support. Total Time Spent 10min   Blaiden Werth A Johnnetta Holstine, MD [facility] Pulmonary Critical Care Medicine Sleep Medicine

## 2019-09-28 NOTE — Progress Notes (Signed)
PROGRESS NOTE    Melissa George  BEM:[id number] DOB: 08/27/1950 DOA: 09/25/2019 PCP: Patient, No Pcp Per   Brief Narrative:  69 year old female with known history of anxiety and COPD who presented to the emergency room for acute onset of dyspnea.  Patient reports shortness of breath over the last several weeks with deterioration on the day of discharge.  Patient uses 2 to 3L nasal cannula at baseline.  On my evaluation today patient is not audibly wheezing however she is doing notable pursed lip breathing and lung sounds are diminished diffusely.  6/18: Patient seen and examined.  Remains on 2 to 3 L supplemental oxygen.  Still with significantly  diminished breath sounds bilaterally and pursed lip breathing associated with tachypnea.  May also be an underlying anxiety factor driving this.  6/19: Patient seen and examined.  Still on 3 L nasal cannula saturating adequately however notable for shallow frequent respirations.  Lung sounds diffusely diminished.  Patient appears slightly worse than yesterday afternoon when I had evaluated her.  Pulmonary consultation called, formal evaluation pending.  Recommendations appreciated.   Assessment & Plan:   Active Problems:   COPD exacerbation ([place])  Acute exacerbation of COPD Acute on chronic hypoxic respiratory failure secondary to above Patient still with significant tachypnea and pursed lip breathing Diffusely diminished breath sounds bilaterally Plan: Increase diuretics for today, Solu-Medrol 60 mg every 8 hours Continue bronchodilator regimen Continue empiric antibiotics MetaNeb every 4 hours Pulmonary consultation requested, recommendations greatly appreciated  Left lung nodule 12 mm spiculated nodule noted on CT chest Will need PET/CT on outpatient basis  Hypothyroidism Continue Synthroid  Dyslipidemia On statin  Depression/anxiety Continue home Lexapro As needed Xanax   DVT prophylaxis: SCD Code Status: DNR Family  Communication: Desiree Hane via phone [phone] on 6/ Disposition Plan: Status is: Inpatient  Remains inpatient appropriate because:Inpatient level of care appropriate due to severity of illness   Dispo: The patient is from: Home              Anticipated d/c is to: Home              Anticipated d/c date is: 2 days              Patient currently is not medically stable to d/c.  Still significantly symptomatic setting of severe COPD.  Will need 1-2 additional days of inpatient monitoring and treatment prior to disposition planning.       Consultants:   Pulmonary  Procedures:   none  Antimicrobials:   Rocephin    Subjective: Patient seen and examined.  Remains on 2 to 3 L supplemental oxygen.  Still significantly dyspneic with pursed lip breathing.  Objective: Vitals:   09/28/19 0608 09/28/19 0800 09/28/19 0909 09/28/19 1000  BP:  (!) 164/75 129/72 113/68  Pulse: 86 (!) 133 (!) 104 85  Resp:  (!) 23 19 18   Temp:  98 F (36.7 C) 98 F (36.7 C) 97.9 F (36.6 C)  TempSrc:  Oral Oral Oral  SpO2: 97% 94% 97% 96%  Weight:      Height:        Intake/Output Summary (Last 24 hours) at 09/28/2019 1124 Last data filed at 09/28/2019 0210 Gross per 24 hour  Intake 98.45 ml  Output --  Net 98.45 ml   Filed Weights   09/25/19 2125 09/26/19 0831  Weight: 50 kg 50 kg    Examination:  General exam: Appears calm and comfortable  Respiratory system: Diffusely diminished breath sounds  bilaterally.  Tachypneic.  Pursed lip breathing noted. Cardiovascular system: S1 & S2 heard, RRR. No JVD, murmurs, rubs, gallops or clicks. No pedal edema. Gastrointestinal system: Abdomen is nondistended, soft and nontender. No organomegaly or masses felt. Normal bowel sounds heard. Central nervous system: Alert and oriented. No focal neurological deficits. Extremities: Symmetric 5 x 5 power. Skin: No rashes, lesions or ulcers Psychiatry: Judgement and insight appear normal. Mood & affect  appropriate.     Data Reviewed: I have personally reviewed following labs and imaging studies  CBC: Recent Labs  Lab 09/25/19 2132 09/26/19 0554 09/28/19 0837  WBC 11.2* 9.9 11.0*  NEUTROABS 9.3*  --  9.7*  HGB 11.7* 11.4* 12.4  HCT 35.0* 35.3* 37.7  MCV 93.3 96.7 95.2  PLT 199 191 096   Basic Metabolic Panel: Recent Labs  Lab 09/25/19 2132 09/26/19 0554 09/28/19 0837  NA 144 142 143  K 3.9 3.5 4.5  CL 103 99 101  CO2 33* 31 30  GLUCOSE 136* 231* 162*  BUN 22 20 31*  CREATININE 0.85 0.85 0.68  CALCIUM 9.1 8.8* 9.2   GFR: Estimated Creatinine Clearance: 51.6 mL/min (by C-G formula based on SCr of 0.68 mg/dL). Liver Function Tests: Recent Labs  Lab 09/25/19 2132  AST 25  ALT 19  ALKPHOS 55  BILITOT 0.5  PROT 6.8  ALBUMIN 3.9   Recent Labs  Lab 09/25/19 2332  LIPASE 61*   No results for input(s): AMMONIA in the last 168 hours. Coagulation Profile: Recent Labs  Lab 09/25/19 2132  INR 0.9   Cardiac Enzymes: No results for input(s): CKTOTAL, CKMB, CKMBINDEX, TROPONINI in the last 168 hours. BNP (last 3 results) No results for input(s): PROBNP in the last 8760 hours. HbA1C: No results for input(s): HGBA1C in the last 72 hours. CBG: No results for input(s): GLUCAP in the last 168 hours. Lipid Profile: No results for input(s): CHOL, HDL, LDLCALC, TRIG, CHOLHDL, LDLDIRECT in the last 72 hours. Thyroid Function Tests: No results for input(s): TSH, T4TOTAL, FREET4, T3FREE, THYROIDAB in the last 72 hours. Anemia Panel: No results for input(s): VITAMINB12, FOLATE, FERRITIN, TIBC, IRON, RETICCTPCT in the last 72 hours. Sepsis Labs: No results for input(s): PROCALCITON, LATICACIDVEN in the last 168 hours.  Recent Results (from the past 240 hour(s))  SARS Coronavirus 2 by RT PCR (hospital order, performed in [facility] hospital lab) Nasopharyngeal Nasopharyngeal Swab     Status: None   Collection Time: 09/25/19  9:32 PM   Specimen: Nasopharyngeal Swab    Result Value Ref Range Status   SARS Coronavirus 2 NEGATIVE NEGATIVE Final    Comment: (NOTE) SARS-CoV-2 target nucleic acids are NOT DETECTED.  The SARS-CoV-2 RNA is generally detectable in upper and lower respiratory specimens during the acute phase of infection. The lowest concentration of SARS-CoV-2 viral copies this assay can detect is 250 copies / mL. A negative result does not preclude SARS-CoV-2 infection and should not be used as the sole basis for treatment or other patient management decisions.  A negative result may occur with improper specimen collection / handling, submission of specimen other than nasopharyngeal swab, presence of viral mutation(s) within the areas targeted by this assay, and inadequate number of viral copies (<250 copies / mL). A negative result must be combined with clinical observations, patient history, and epidemiological information.  Fact Sheet for Patients:   StrictlyIdeas.no  Fact Sheet for Healthcare Providers: BankingDealers.co.za  This test is not yet approved or  cleared by the [place] FDA  and has been authorized for detection and/or diagnosis of SARS-CoV-2 by FDA under an Emergency Use Authorization (EUA).  This EUA will remain in effect (meaning this test can be used) for the duration of the COVID-19 declaration under Section 564(b)(1) of the Act, 21 U.S.C. section 360bbb-3(b)(1), unless the authorization is terminated or revoked sooner.  Performed at [facility], [address], [place], [place] [address]          Radiology Studies: No results found.      Scheduled Meds: . arformoterol  15 mcg Nebulization BID  . atorvastatin  20 mg Oral QHS  . budesonide (PULMICORT) nebulizer solution  0.25 mg Nebulization BID  . busPIRone  5 mg Oral BID  . calcium carbonate  1 tablet Oral Daily  . calcium-vitamin D  1 tablet Oral Daily  . docusate sodium  100 mg Oral  BID  . escitalopram  20 mg Oral Daily  . guaiFENesin  600 mg Oral BID  . ipratropium-albuterol  3 mL Nebulization Q4H  . levothyroxine  50 mcg Oral Q M,W,F  . levothyroxine  75 mcg Oral Once per day on Sun Thu Sat  . LORazepam  0.5 mg Oral BID  . methylPREDNISolone (SOLU-MEDROL) injection  60 mg Intravenous Q8H  . mirtazapine  30 mg Oral QHS  . multivitamin with minerals  1 tablet Oral Daily  . multivitamin with minerals  1 tablet Oral Daily  . perphenazine  8 mg Oral QHS   Continuous Infusions: . cefTRIAXone (ROCEPHIN)  IV Stopped (09/28/19 0121)     LOS: 2 days    Time spent: 35 minutes    Sidney Ace, MD [facility] Pager [phone]  If 7PM-7AM, please contact night-coverage 09/28/2019, 11:24 AM

## 2019-09-29 MED ORDER — METHYLPREDNISOLONE SODIUM SUCC 40 MG IJ SOLR
40.0000 mg | Freq: Three times a day (TID) | INTRAMUSCULAR | Status: DC
  Administered 2019-09-29 – 2019-09-30 (×3): 40 mg via INTRAVENOUS
  Filled 2019-09-29 (×3): qty 1

## 2019-09-29 MED ORDER — SODIUM CHLORIDE 0.9 % IV SOLN
500.0000 mg | INTRAVENOUS | Status: AC
  Administered 2019-09-29 – 2019-10-01 (×3): 500 mg via INTRAVENOUS
  Filled 2019-09-29 (×3): qty 500

## 2019-09-29 NOTE — Progress Notes (Signed)
PROGRESS NOTE    ALYSS GRANATO  OZH:[id number] DOB: Nov 25, 1950 DOA: 09/25/2019 PCP: Patient, No Pcp Per   Brief Narrative:  69 year old female with known history of anxiety and COPD who presented to the emergency room for acute onset of dyspnea.  Patient reports shortness of breath over the last several weeks with deterioration on the day of discharge.  Patient uses 2 to 3L nasal cannula at baseline.  On my evaluation today patient is not audibly wheezing however she is doing notable pursed lip breathing and lung sounds are diminished diffusely.  6/18: Patient seen and examined.  Remains on 2 to 3 L supplemental oxygen.  Still with significantly  diminished breath sounds bilaterally and pursed lip breathing associated with tachypnea.  May also be an underlying anxiety factor driving this.  6/19: Patient seen and examined.  Still on 3 L nasal cannula saturating adequately however notable for shallow frequent respirations.  Lung sounds diffusely diminished.  Patient appears slightly worse than yesterday afternoon when I had evaluated her.  Pulmonary consultation called, formal evaluation pending.  Recommendations appreciated.  6/20: Patient seen and examined.  Remains on 3 to 4 L nasal cannula.  Seen by pulmonary yesterday.  Recommendations appreciated.  Theophylline added.  Patient reports mild improvement in her symptoms over interval.   Assessment & Plan:   Active Problems:   COPD exacerbation ([place])  Acute exacerbation of COPD Acute on chronic hypoxic respiratory failure secondary to above Patient still with significant tachypnea and pursed lip breathing Diffusely diminished breath sounds bilaterally Plan: Continue Solu-Medrol, 40 mg IV every 8 hours Continue bronchodilator regimen Theophylline added per pulmonary Spiriva added per pulmonary Continue empiric antibiotics, add azithromycin for anti-inflammatory effect MetaNeb every 4 hours Will refer to pulmonary on  discharge  Left lung nodule 12 mm spiculated nodule noted on CT chest Will need PET/CT on outpatient basis  Hypothyroidism Continue Synthroid  Dyslipidemia On statin  Depression/anxiety Continue home Lexapro As needed Xanax   DVT prophylaxis: SCD Code Status: DNR Family Communication: Left VM for Desiree Hane via phone [phone] on 6/20 Disposition Plan: Status is: Inpatient  Remains inpatient appropriate because:Inpatient level of care appropriate due to severity of illness   Dispo: The patient is from: Home              Anticipated d/c is to: Home              Anticipated d/c date is: 2 days              Patient currently is not medically stable to d/c.  Still significantly symptomatic setting of severe COPD.  Will need 1-2 additional days of inpatient monitoring and treatment prior to disposition planning.       Consultants:   Pulmonary  Procedures:   none  Antimicrobials:   Rocephin    Subjective: Patient seen and examined.  Remains on 2 to 3 L supplemental oxygen.  Air movement slowly improving Objective: Vitals:   09/29/19 0415 09/29/19 0420 09/29/19 0626 09/29/19 0733  BP:  (!) 94/49  132/62  Pulse:  79 85 88  Resp:  18  16  Temp:    98.8 F (37.1 C)  TempSrc:    Oral  SpO2: 98% 96% 98% 99%  Weight:      Height:        Intake/Output Summary (Last 24 hours) at 09/29/2019 0955 Last data filed at 09/29/2019 0139 Gross per 24 hour  Intake 580 ml  Output --  Net 580 ml   Filed Weights   09/25/19 2125 09/26/19 0831  Weight: 50 kg 50 kg    Examination:  General exam: Appears calm and comfortable  Respiratory system: Diminished breath sounds but improving over interval.  Scattered crackles.  Normal work of breathing Cardiovascular system: S1 & S2 heard, RRR. No JVD, murmurs, rubs, gallops or clicks. No pedal edema. Gastrointestinal system: Abdomen is nondistended, soft and nontender. No organomegaly or masses felt. Normal bowel sounds  heard. Central nervous system: Alert and oriented. No focal neurological deficits. Extremities: Symmetric 5 x 5 power. Skin: No rashes, lesions or ulcers Psychiatry: Judgement and insight appear normal. Mood & affect appropriate.     Data Reviewed: I have personally reviewed following labs and imaging studies  CBC: Recent Labs  Lab 09/25/19 2132 09/26/19 0554 09/28/19 0837  WBC 11.2* 9.9 11.0*  NEUTROABS 9.3*  --  9.7*  HGB 11.7* 11.4* 12.4  HCT 35.0* 35.3* 37.7  MCV 93.3 96.7 95.2  PLT 199 191 254   Basic Metabolic Panel: Recent Labs  Lab 09/25/19 2132 09/26/19 0554 09/28/19 0837  NA 144 142 143  K 3.9 3.5 4.5  CL 103 99 101  CO2 33* 31 30  GLUCOSE 136* 231* 162*  BUN 22 20 31*  CREATININE 0.85 0.85 0.68  CALCIUM 9.1 8.8* 9.2   GFR: Estimated Creatinine Clearance: 51.6 mL/min (by C-G formula based on SCr of 0.68 mg/dL). Liver Function Tests: Recent Labs  Lab 09/25/19 2132  AST 25  ALT 19  ALKPHOS 55  BILITOT 0.5  PROT 6.8  ALBUMIN 3.9   Recent Labs  Lab 09/25/19 2332  LIPASE 61*   No results for input(s): AMMONIA in the last 168 hours. Coagulation Profile: Recent Labs  Lab 09/25/19 2132  INR 0.9   Cardiac Enzymes: No results for input(s): CKTOTAL, CKMB, CKMBINDEX, TROPONINI in the last 168 hours. BNP (last 3 results) No results for input(s): PROBNP in the last 8760 hours. HbA1C: No results for input(s): HGBA1C in the last 72 hours. CBG: No results for input(s): GLUCAP in the last 168 hours. Lipid Profile: No results for input(s): CHOL, HDL, LDLCALC, TRIG, CHOLHDL, LDLDIRECT in the last 72 hours. Thyroid Function Tests: No results for input(s): TSH, T4TOTAL, FREET4, T3FREE, THYROIDAB in the last 72 hours. Anemia Panel: No results for input(s): VITAMINB12, FOLATE, FERRITIN, TIBC, IRON, RETICCTPCT in the last 72 hours. Sepsis Labs: No results for input(s): PROCALCITON, LATICACIDVEN in the last 168 hours.  Recent Results (from the past 240  hour(s))  SARS Coronavirus 2 by RT PCR (hospital order, performed in [facility] hospital lab) Nasopharyngeal Nasopharyngeal Swab     Status: None   Collection Time: 09/25/19  9:32 PM   Specimen: Nasopharyngeal Swab  Result Value Ref Range Status   SARS Coronavirus 2 NEGATIVE NEGATIVE Final    Comment: (NOTE) SARS-CoV-2 target nucleic acids are NOT DETECTED.  The SARS-CoV-2 RNA is generally detectable in upper and lower respiratory specimens during the acute phase of infection. The lowest concentration of SARS-CoV-2 viral copies this assay can detect is 250 copies / mL. A negative result does not preclude SARS-CoV-2 infection and should not be used as the sole basis for treatment or other patient management decisions.  A negative result may occur with improper specimen collection / handling, submission of specimen other than nasopharyngeal swab, presence of viral mutation(s) within the areas targeted by this assay, and inadequate number of viral copies (<250 copies / mL). A negative result must be  combined with clinical observations, patient history, and epidemiological information.  Fact Sheet for Patients:   StrictlyIdeas.no  Fact Sheet for Healthcare Providers: BankingDealers.co.za  This test is not yet approved or  cleared by the [place] FDA and has been authorized for detection and/or diagnosis of SARS-CoV-2 by FDA under an Emergency Use Authorization (EUA).  This EUA will remain in effect (meaning this test can be used) for the duration of the COVID-19 declaration under Section 564(b)(1) of the Act, 21 U.S.C. section 360bbb-3(b)(1), unless the authorization is terminated or revoked sooner.  Performed at [facility], [address], [place], [place] [address]          Radiology Studies: No results found.      Scheduled Meds: . arformoterol  15 mcg Nebulization BID  . atorvastatin  20 mg Oral QHS    . budesonide (PULMICORT) nebulizer solution  0.25 mg Nebulization BID  . busPIRone  5 mg Oral BID  . calcium carbonate  1 tablet Oral Daily  . calcium-vitamin D  1 tablet Oral Daily  . docusate sodium  100 mg Oral BID  . escitalopram  20 mg Oral Daily  . guaiFENesin  600 mg Oral BID  . ipratropium-albuterol  3 mL Nebulization Q4H  . levothyroxine  50 mcg Oral Q M,W,F  . levothyroxine  75 mcg Oral Once per day on Sun Thu Sat  . LORazepam  0.5 mg Oral BID  . methylPREDNISolone (SOLU-MEDROL) injection  60 mg Intravenous Q8H  . mirtazapine  30 mg Oral QHS  . multivitamin with minerals  1 tablet Oral Daily  . perphenazine  8 mg Oral QHS  . roflumilast  250 mcg Oral Daily  . theophylline  100 mg Oral QHS  . tiotropium  18 mcg Inhalation Daily   Continuous Infusions: . azithromycin    . cefTRIAXone (ROCEPHIN)  IV Stopped (09/29/19 0054)     LOS: 3 days    Time spent: 35 minutes    Sidney Ace, MD [facility] Pager [phone]  If 7PM-7AM, please contact night-coverage 09/29/2019, 9:55 AM

## 2019-09-30 LAB — CBC WITH DIFFERENTIAL/PLATELET
Abs Immature Granulocytes: 0.2 10*3/uL — ABNORMAL HIGH (ref 0.00–0.07)
Basophils Absolute: 0 10*3/uL (ref 0.0–0.1)
Basophils Relative: 0 %
Eosinophils Absolute: 0 10*3/uL (ref 0.0–0.5)
Eosinophils Relative: 0 %
HCT: 35 % — ABNORMAL LOW (ref 36.0–46.0)
Hemoglobin: 11.7 g/dL — ABNORMAL LOW (ref 12.0–15.0)
Immature Granulocytes: 2 %
Lymphocytes Relative: 16 %
Lymphs Abs: 1.9 10*3/uL (ref 0.7–4.0)
MCH: 31.5 pg (ref 26.0–34.0)
MCHC: 33.4 g/dL (ref 30.0–36.0)
MCV: 94.1 fL (ref 80.0–100.0)
Monocytes Absolute: 1.3 10*3/uL — ABNORMAL HIGH (ref 0.1–1.0)
Monocytes Relative: 11 %
Neutro Abs: 8.4 10*3/uL — ABNORMAL HIGH (ref 1.7–7.7)
Neutrophils Relative %: 71 %
Platelets: 256 10*3/uL (ref 150–400)
RBC: 3.72 MIL/uL — ABNORMAL LOW (ref 3.87–5.11)
RDW: 12.6 % (ref 11.5–15.5)
WBC: 11.8 10*3/uL — ABNORMAL HIGH (ref 4.0–10.5)
nRBC: 0 % (ref 0.0–0.2)

## 2019-09-30 LAB — BASIC METABOLIC PANEL
Anion gap: 9 (ref 5–15)
BUN: 34 mg/dL — ABNORMAL HIGH (ref 8–23)
CO2: 30 mmol/L (ref 22–32)
Calcium: 8.6 mg/dL — ABNORMAL LOW (ref 8.9–10.3)
Chloride: 103 mmol/L (ref 98–111)
Creatinine, Ser: 0.64 mg/dL (ref 0.44–1.00)
GFR calc Af Amer: >60 mL/min (ref >60)
GFR calc non Af Amer: >60 mL/min (ref >60)
Glucose, Bld: 131 mg/dL — ABNORMAL HIGH (ref 70–99)
Potassium: 3.6 mmol/L (ref 3.5–5.1)
Sodium: 142 mmol/L (ref 135–145)

## 2019-09-30 MED ORDER — METHYLPREDNISOLONE SODIUM SUCC 40 MG IJ SOLR
40.0000 mg | Freq: Two times a day (BID) | INTRAMUSCULAR | Status: DC
  Administered 2019-09-30 – 2019-10-04 (×8): 40 mg via INTRAVENOUS
  Filled 2019-09-30 (×8): qty 1

## 2019-09-30 NOTE — Care Management Important Message (Signed)
Important Message  Patient Details  Name: Melissa George MRN: [id number] Date of Birth: 1950/09/15   Medicare Important Message Given:  Yes     Juliann Pulse A Demarri Elie 09/30/2019, 2:42 PM

## 2019-09-30 NOTE — Progress Notes (Signed)
PROGRESS NOTE    Melissa George  QBH:[id number] DOB: [birth year] DOA: 09/25/2019 PCP: Patient, No Pcp Per   Brief Narrative:  69 year old female with known history of anxiety and COPD who presented to the emergency room for acute onset of dyspnea.  Patient reports shortness of breath over the last several weeks with deterioration on the day of discharge.  Patient uses 2 to 3L nasal cannula at baseline.  On my evaluation today patient is not audibly wheezing however she is doing notable pursed lip breathing and lung sounds are diminished diffusely.  6/18: Patient seen and examined.  Remains on 2 to 3 L supplemental oxygen.  Still with significantly  diminished breath sounds bilaterally and pursed lip breathing associated with tachypnea.  May also be an underlying anxiety factor driving this.  6/19: Patient seen and examined.  Still on 3 L nasal cannula saturating adequately however notable for shallow frequent respirations.  Lung sounds diffusely diminished.  Patient appears slightly worse than yesterday afternoon when I had evaluated her.  Pulmonary consultation called, formal evaluation pending.  Recommendations appreciated.  6/20: Patient seen and examined.  Remains on 3 to 4 L nasal cannula.  Seen by pulmonary yesterday.  Recommendations appreciated.  Theophylline added.  Patient reports mild improvement in her symptoms over interval.  6/21: Patient seen and examined.  Respiratory status appears to be improving.  Titrated 2 L nasal cannula   Assessment & Plan:   Active Problems:   COPD exacerbation ([place])  Acute exacerbation of COPD Acute on chronic hypoxic respiratory failure secondary to above Patient still with significant tachypnea and pursed lip breathing Diffusely diminished breath sounds bilaterally Plan: Continue Solu-Medrol, 40 mg IV every 12 hours Continue bronchodilator regimen Theophylline added per pulmonary, check level in the morning Spiriva added per  pulmonary Continue empiric antibiotics, add azithromycin for anti-inflammatory effect Stop Rocephin after today's dose MetaNeb every 4 hours Will refer to pulmonary on discharge  Left lung nodule 12 mm spiculated nodule noted on CT chest Will need PET/CT on outpatient basis  Hypothyroidism Continue Synthroid  Dyslipidemia On statin  Depression/anxiety Continue home Lexapro As needed Xanax   DVT prophylaxis: SCD Code Status: DNR Family Communication: Left VM for Desiree Hane via phone [phone] on 6/20 Disposition Plan: Status is: Inpatient  Remains inpatient appropriate because:Inpatient level of care appropriate due to severity of illness   Dispo: The patient is from: Home              Anticipated d/c is to: Home              Anticipated d/c date is: 2 days              Patient currently is not medically stable to d/c.  Still significantly symptomatic setting of severe COPD.  Will need 1-2 additional days of inpatient monitoring and treatment prior to disposition planning.       Consultants:   Pulmonary  Procedures:   none  Antimicrobials:   Rocephin    Subjective: Patient seen and examined.  Remains on 2 to 3 L supplemental oxygen.  Air movement slowly improving Objective: Vitals:   09/30/19 0405 09/30/19 0415 09/30/19 0500 09/30/19 0800  BP:   (!) 94/59 (!) 93/56  Pulse:   93 86  Resp:   19 17  Temp:    97.9 F (36.6 C)  TempSrc:  Oral Oral Oral  SpO2: 98%  97% 97%  Weight:      Height:  Intake/Output Summary (Last 24 hours) at 09/30/2019 1207 Last data filed at 09/30/2019 0500 Gross per 24 hour  Intake 350 ml  Output --  Net 350 ml   Filed Weights   09/25/19 2125 09/26/19 0831  Weight: 50 kg 50 kg    Examination:  General exam: Appears calm and comfortable  Respiratory system: Diminished breath sounds but improving over interval.  Scattered crackles.  Normal work of breathing Cardiovascular system: S1 & S2 heard, RRR. No  JVD, murmurs, rubs, gallops or clicks. No pedal edema. Gastrointestinal system: Abdomen is nondistended, soft and nontender. No organomegaly or masses felt. Normal bowel sounds heard. Central nervous system: Alert and oriented. No focal neurological deficits. Extremities: Symmetric 5 x 5 power. Skin: No rashes, lesions or ulcers Psychiatry: Judgement and insight appear normal. Mood & affect appropriate.     Data Reviewed: I have personally reviewed following labs and imaging studies  CBC: Recent Labs  Lab 09/25/19 2132 09/26/19 0554 09/28/19 0837 09/30/19 0417  WBC 11.2* 9.9 11.0* 11.8*  NEUTROABS 9.3*  --  9.7* 8.4*  HGB 11.7* 11.4* 12.4 11.7*  HCT 35.0* 35.3* 37.7 35.0*  MCV 93.3 96.7 95.2 94.1  PLT 199 191 249 269   Basic Metabolic Panel: Recent Labs  Lab 09/25/19 2132 09/26/19 0554 09/28/19 0837 09/30/19 0417  NA 144 142 143 142  K 3.9 3.5 4.5 3.6  CL 103 99 101 103  CO2 33* 31 30 30   GLUCOSE 136* 231* 162* 131*  BUN 22 20 31* 34*  CREATININE 0.85 0.85 0.68 0.64  CALCIUM 9.1 8.8* 9.2 8.6*   GFR: Estimated Creatinine Clearance: 51.6 mL/min (by C-G formula based on SCr of 0.64 mg/dL). Liver Function Tests: Recent Labs  Lab 09/25/19 2132  AST 25  ALT 19  ALKPHOS 55  BILITOT 0.5  PROT 6.8  ALBUMIN 3.9   Recent Labs  Lab 09/25/19 2332  LIPASE 61*   No results for input(s): AMMONIA in the last 168 hours. Coagulation Profile: Recent Labs  Lab 09/25/19 2132  INR 0.9   Cardiac Enzymes: No results for input(s): CKTOTAL, CKMB, CKMBINDEX, TROPONINI in the last 168 hours. BNP (last 3 results) No results for input(s): PROBNP in the last 8760 hours. HbA1C: No results for input(s): HGBA1C in the last 72 hours. CBG: No results for input(s): GLUCAP in the last 168 hours. Lipid Profile: No results for input(s): CHOL, HDL, LDLCALC, TRIG, CHOLHDL, LDLDIRECT in the last 72 hours. Thyroid Function Tests: No results for input(s): TSH, T4TOTAL, FREET4, T3FREE,  THYROIDAB in the last 72 hours. Anemia Panel: No results for input(s): VITAMINB12, FOLATE, FERRITIN, TIBC, IRON, RETICCTPCT in the last 72 hours. Sepsis Labs: No results for input(s): PROCALCITON, LATICACIDVEN in the last 168 hours.  Recent Results (from the past 240 hour(s))  SARS Coronavirus 2 by RT PCR (hospital order, performed in [facility] hospital lab) Nasopharyngeal Nasopharyngeal Swab     Status: None   Collection Time: 09/25/19  9:32 PM   Specimen: Nasopharyngeal Swab  Result Value Ref Range Status   SARS Coronavirus 2 NEGATIVE NEGATIVE Final    Comment: (NOTE) SARS-CoV-2 target nucleic acids are NOT DETECTED.  The SARS-CoV-2 RNA is generally detectable in upper and lower respiratory specimens during the acute phase of infection. The lowest concentration of SARS-CoV-2 viral copies this assay can detect is 250 copies / mL. A negative result does not preclude SARS-CoV-2 infection and should not be used as the sole basis for treatment or other patient management decisions.  A negative result may occur with improper specimen collection / handling, submission of specimen other than nasopharyngeal swab, presence of viral mutation(s) within the areas targeted by this assay, and inadequate number of viral copies (<250 copies / mL). A negative result must be combined with clinical observations, patient history, and epidemiological information.  Fact Sheet for Patients:   StrictlyIdeas.no  Fact Sheet for Healthcare Providers: BankingDealers.co.za  This test is not yet approved or  cleared by the [place] FDA and has been authorized for detection and/or diagnosis of SARS-CoV-2 by FDA under an Emergency Use Authorization (EUA).  This EUA will remain in effect (meaning this test can be used) for the duration of the COVID-19 declaration under Section 564(b)(1) of the Act, 21 U.S.C. section 360bbb-3(b)(1), unless the authorization  is terminated or revoked sooner.  Performed at [facility], [address], [place], [place] [address]          Radiology Studies: No results found.      Scheduled Meds: . arformoterol  15 mcg Nebulization BID  . atorvastatin  20 mg Oral QHS  . budesonide (PULMICORT) nebulizer solution  0.25 mg Nebulization BID  . busPIRone  5 mg Oral BID  . calcium carbonate  1 tablet Oral Daily  . calcium-vitamin D  1 tablet Oral Daily  . docusate sodium  100 mg Oral BID  . escitalopram  20 mg Oral Daily  . guaiFENesin  600 mg Oral BID  . ipratropium-albuterol  3 mL Nebulization Q4H  . levothyroxine  50 mcg Oral Q M,W,F  . levothyroxine  75 mcg Oral Once per day on Sun Thu Sat  . LORazepam  0.5 mg Oral BID  . methylPREDNISolone (SOLU-MEDROL) injection  40 mg Intravenous Q12H  . mirtazapine  30 mg Oral QHS  . multivitamin with minerals  1 tablet Oral Daily  . perphenazine  8 mg Oral QHS  . roflumilast  250 mcg Oral Daily  . theophylline  100 mg Oral QHS  . tiotropium  18 mcg Inhalation Daily   Continuous Infusions: . azithromycin 500 mg (09/30/19 0920)  . cefTRIAXone (ROCEPHIN)  IV Stopped (09/30/19 0244)     LOS: 4 days    Time spent: 35 minutes    Sidney Ace, MD [facility] Pager [phone]  If 7PM-7AM, please contact night-coverage 09/30/2019, 12:07 PM

## 2019-10-01 LAB — CBC WITH DIFFERENTIAL/PLATELET
Abs Immature Granulocytes: 0.28 10*3/uL — ABNORMAL HIGH (ref 0.00–0.07)
Basophils Absolute: 0.1 10*3/uL (ref 0.0–0.1)
Basophils Relative: 1 %
Eosinophils Absolute: 0 10*3/uL (ref 0.0–0.5)
Eosinophils Relative: 0 %
HCT: 36.7 % (ref 36.0–46.0)
Hemoglobin: 11.9 g/dL — ABNORMAL LOW (ref 12.0–15.0)
Immature Granulocytes: 2 %
Lymphocytes Relative: 13 %
Lymphs Abs: 1.7 10*3/uL (ref 0.7–4.0)
MCH: 31.2 pg (ref 26.0–34.0)
MCHC: 32.4 g/dL (ref 30.0–36.0)
MCV: 96.3 fL (ref 80.0–100.0)
Monocytes Absolute: 1.5 10*3/uL — ABNORMAL HIGH (ref 0.1–1.0)
Monocytes Relative: 12 %
Neutro Abs: 9.1 10*3/uL — ABNORMAL HIGH (ref 1.7–7.7)
Neutrophils Relative %: 72 %
Platelets: 260 10*3/uL (ref 150–400)
RBC: 3.81 MIL/uL — ABNORMAL LOW (ref 3.87–5.11)
RDW: 12.5 % (ref 11.5–15.5)
WBC: 12.6 10*3/uL — ABNORMAL HIGH (ref 4.0–10.5)
nRBC: 0 % (ref 0.0–0.2)

## 2019-10-01 LAB — BASIC METABOLIC PANEL
Anion gap: 8 (ref 5–15)
BUN: 34 mg/dL — ABNORMAL HIGH (ref 8–23)
CO2: 32 mmol/L (ref 22–32)
Calcium: 8.6 mg/dL — ABNORMAL LOW (ref 8.9–10.3)
Chloride: 104 mmol/L (ref 98–111)
Creatinine, Ser: 0.65 mg/dL (ref 0.44–1.00)
GFR calc Af Amer: >60 mL/min (ref >60)
GFR calc non Af Amer: >60 mL/min (ref >60)
Glucose, Bld: 96 mg/dL (ref 70–99)
Potassium: 4 mmol/L (ref 3.5–5.1)
Sodium: 144 mmol/L (ref 135–145)

## 2019-10-01 NOTE — Evaluation (Signed)
Physical Therapy Evaluation Patient Details Name: Melissa George MRN: [id number] DOB: 1951/03/14 Today's Date: 10/01/2019   History of Present Illness  Patient is a 69 year old female with history of anxiety, depression, COPD who presents with acute onset of dyspnea. Patient uses 2-3 L 02 at baseline. Found to have acute exacerbation of COPD, acute on chronic hypoxic respiratory failure, left lung nodule. Per chart patient has psychiatric history with borderline personality disorder and severe depression and currently lives in a group home.   Clinical Impression  PT evaluation complete. Patient usually ambulates with a rollator at group home and reports she wears 3 L 02 at baseline. Patient currently is able to ambulate 83ft with Min guard assistance and is fatigued with minimal activity. Vitals monitored throughout. Resting vitals: heart rate 112bpm, respiration rate 18, Sp02 96%. After activity and with exercise: heart rate 122bpm, respiration rate 40, Sp02 92%. Multiple rest breaks required with functional activity. Patient has generalized weakness, deconditioning, and would benefit from PT to maximize function and safety to facilitate independence and return to PLOF. Patient wants to return to group home at discharge. Recommend HHPT.     Follow Up Recommendations Home health PT    Equipment Recommendations  None recommended by PT    Recommendations for Other Services       Precautions / Restrictions Precautions Precautions: Fall Restrictions Weight Bearing Restrictions: No      Mobility  Bed Mobility Overal bed mobility: Needs Assistance Bed Mobility: Supine to Sit;Sit to Supine     Supine to sit: Min guard;HOB elevated Sit to supine: Min guard;HOB elevated   General bed mobility comments: increased time required to complete tasks   Transfers Overall transfer level: Needs assistance   Transfers: Sit to/from Stand Sit to Stand: Min guard         General transfer  comment: Min guard for safety from standing from bed and from toilet   Ambulation/Gait Ambulation/Gait assistance: Min guard Gait Distance (Feet): 25 Feet Assistive device: 1 person hand held assist Gait Pattern/deviations: Narrow base of support;Decreased stride length Gait velocity: decreased    General Gait Details: Min guard for safety with ambulation. patient fatigued and short of breath with activity. vitals monitored throughout session. patient also reports she wears oxygen at home but does not feel this short of breath or fatigued with minimal activity at baseline   Stairs            Wheelchair Mobility    Modified Rankin (Stroke Patients Only)       Balance Overall balance assessment: Needs assistance Sitting-balance support: Feet supported;No upper extremity supported Sitting balance-Leahy Scale: Good     Standing balance support: No upper extremity supported Standing balance-Leahy Scale: Fair                               Pertinent Vitals/Pain Pain Assessment: No/denies pain    Home Living Family/patient expects to be discharged to:: Group home                 Additional Comments: patient reports 3-4 steps with railling to enter    Prior Function Level of Independence: Independent with assistive device(s)         Comments: rollator used for ambulation. assistance required for bathing and intermittent assistance required for dressing per patient report.      Hand Dominance   Dominant Hand: Right    Extremity/Trunk Assessment  Upper Extremity Assessment Upper Extremity Assessment: Overall WFL for tasks assessed    Lower Extremity Assessment Lower Extremity Assessment: Generalized weakness       Communication   Communication: No difficulties  Cognition Arousal/Alertness: Awake/alert Behavior During Therapy: WFL for tasks assessed/performed Overall Cognitive Status: Within Functional Limits for tasks assessed                                         General Comments      Exercises General Exercises - Lower Extremity Ankle Circles/Pumps: AROM;Strengthening;Both;10 reps;Seated Long Arc Quad: AROM;Strengthening;Both;10 reps;Seated Heel Slides: AROM;Strengthening;Both;10 reps;Supine Straight Leg Raises: AROM;10 reps;Strengthening;Both;Supine Hip Flexion/Marching: AROM;Strengthening;Both;10 reps;Seated Other Exercises Other Exercises: verbal and visual cues for technique with exercise. respirations increased into the 30's with exercise with Sp02 92-93% on 4 L 02    Assessment/Plan    PT Assessment Patient needs continued PT services  PT Problem List Decreased strength;Decreased activity tolerance;Decreased balance;Decreased mobility;Cardiopulmonary status limiting activity;Decreased safety awareness       PT Treatment Interventions DME instruction;Gait training;Stair training;Functional mobility training;Therapeutic activities;Therapeutic exercise;Balance training;Patient/family education    PT Goals (Current goals can be found in the Care Plan section)  Acute Rehab PT Goals Patient Stated Goal: to be able functional well enough to go back to group home  PT Goal Formulation: With patient Time For Goal Achievement: 10/15/19 Potential to Achieve Goals: Good    Frequency Min 2X/week   Barriers to discharge        Co-evaluation               AM-PAC PT "6 Clicks" Mobility  Outcome Measure Help needed turning from your back to your side while in a flat bed without using bedrails?: A Little Help needed moving from lying on your back to sitting on the side of a flat bed without using bedrails?: A Little Help needed moving to and from a bed to a chair (including a wheelchair)?: A Little Help needed standing up from a chair using your arms (e.g., wheelchair or bedside chair)?: A Little Help needed to walk in hospital room?: A Little Help needed climbing 3-5 steps with a  railing? : A Little 6 Click Score: 18    End of Session Equipment Utilized During Treatment: Oxygen Activity Tolerance: Patient limited by fatigue Patient left: in bed;with call bell/phone within reach;with bed alarm set Nurse Communication: Mobility status (via white board ) PT Visit Diagnosis: Unsteadiness on feet (R26.81);Muscle weakness (generalized) (M62.81)    Time: 8341-9622 PT Time Calculation (min) (ACUTE ONLY): 24 min   Charges:   PT Evaluation $PT Eval Moderate Complexity: 1 Mod PT Treatments $Therapeutic Exercise: 8-22 mins        [name], PT, MPT  Percell Locus 10/01/2019, 1:15 PM

## 2019-10-01 NOTE — Progress Notes (Signed)
PROGRESS NOTE    Melissa George  PYK:[id number] DOB: Dec 06, 1950 DOA: 09/25/2019 PCP: Patient, No Pcp Per   Brief Narrative:  69 year old female with known history of anxiety and COPD who presented to the emergency room for acute onset of dyspnea.  Patient reports shortness of breath over the last several weeks with deterioration on the day of discharge.  Patient uses 2 to 3L nasal cannula at baseline.  On my evaluation today patient is not audibly wheezing however she is doing notable pursed lip breathing and lung sounds are diminished diffusely.  6/18: Patient seen and examined.  Remains on 2 to 3 L supplemental oxygen.  Still with significantly  diminished breath sounds bilaterally and pursed lip breathing associated with tachypnea.  May also be an underlying anxiety factor driving this.  6/19: Patient seen and examined.  Still on 3 L nasal cannula saturating adequately however notable for shallow frequent respirations.  Lung sounds diffusely diminished.  Patient appears slightly worse than yesterday afternoon when I had evaluated her.  Pulmonary consultation called, formal evaluation pending.  Recommendations appreciated.  6/20: Patient seen and examined.  Remains on 3 to 4 L nasal cannula.  Seen by pulmonary yesterday.  Recommendations appreciated.  Theophylline added.  Patient reports mild improvement in her symptoms over interval.  6/21: Patient seen and examined.  Respiratory status appears to be improving.  Titrated 2 L nasal cannula  6/22: Patient seen and examined.  Reports minimal improvement in her respiratory status.  Still on 4 L nasal cannula saturating 98%.  Air movement slowly improving.  Patient endorses that minimal exertion she becomes extremely dyspneic.  Spoke with patient's son [name] via phone.  [name] states that the patient is currently a ward of the state due to history of mental illness.  She has received several psychiatric diagnoses including borderline personality  disorder and severe depression.  Currently only on BuSpar, Lexapro, as needed Xanax.   Assessment & Plan:   Active Problems:   COPD exacerbation (HCC)  Acute exacerbation of COPD Acute on chronic hypoxic respiratory failure secondary to above Patient still with significant tachypnea and pursed lip breathing Diffusely diminished breath sounds bilaterally Plan: Continue Solu-Medrol, 40 mg IV every 12 hours.  Can likely transition to prednisone within 24 hours Continue bronchodilator regimen Theophylline added per pulmonary, check level in the morning Spiriva added per pulmonary Azithromycin x 3 doses Completed 5 days of Rocephin MetaNeb every 4 hours Will refer to pulmonary on discharge Given patient's advanced COPD and limited response to aggressive medical therapy we will involve the palliative care service for goals of care discussion and symptom management.  The recommendations appreciated.  Left lung nodule 12 mm spiculated nodule noted on CT chest Will need PET/CT on outpatient basis  Hypothyroidism Continue Synthroid  Dyslipidemia On statin  Depression/anxiety Continue home Lexapro As needed Xanax   DVT prophylaxis: SCD Code Status: DNR Family Communication:Son Travis via phone [phone] on 6/22 Disposition Plan: Status is: Inpatient  Remains inpatient appropriate because:Inpatient level of care appropriate due to severity of illness   Dispo: The patient is from: Home              Anticipated d/c is to: Home              Anticipated d/c date is: 2 days              Patient currently is not medically stable to d/c.  Still very symptomatic and short of breath.  Patient  has not responded to aggressive medical therapy.  Will need evaluation of overall functional status and further discussion regarding goals of care and symptom management.  Palliative care consult called.  Physical therapy occupational therapy evaluations appreciated.   Consultants:    Pulmonary  Procedures:   none  Antimicrobials:   Rocephin    Subjective: Patient seen and examined.  Remains on 2 to 3 L supplemental oxygen.  Air movement slowly improving Objective: Vitals:   09/30/19 2017 10/01/19 0348 10/01/19 0400 10/01/19 0930  BP: (!) 159/82 (!) 100/54 (!) 102/37 104/66  Pulse: (!) 108 98 100   Resp: 20 18 16    Temp: 98 F (36.7 C) 98.7 F (37.1 C) 98.8 F (37.1 C) 97.7 F (36.5 C)  TempSrc: Oral Oral Oral Oral  SpO2: 95% 97% 98%   Weight:      Height:       No intake or output data in the 24 hours ending 10/01/19 1035 Filed Weights   09/25/19 2125 09/26/19 0831  Weight: 50 kg 50 kg    Examination:  General exam: Appears calm and comfortable  Respiratory system: Diminished breath sounds but improving over interval.  Scattered crackles.  Normal work of breathing Cardiovascular system: S1 & S2 heard, RRR. No JVD, murmurs, rubs, gallops or clicks. No pedal edema. Gastrointestinal system: Abdomen is nondistended, soft and nontender. No organomegaly or masses felt. Normal bowel sounds heard. Central nervous system: Alert and oriented. No focal neurological deficits. Extremities: Symmetric 5 x 5 power. Skin: No rashes, lesions or ulcers Psychiatry: Judgement and insight appear normal. Mood & affect appropriate.     Data Reviewed: I have personally reviewed following labs and imaging studies  CBC: Recent Labs  Lab 09/25/19 2132 09/26/19 0554 09/28/19 0837 09/30/19 0417 10/01/19 0522  WBC 11.2* 9.9 11.0* 11.8* 12.6*  NEUTROABS 9.3*  --  9.7* 8.4* 9.1*  HGB 11.7* 11.4* 12.4 11.7* 11.9*  HCT 35.0* 35.3* 37.7 35.0* 36.7  MCV 93.3 96.7 95.2 94.1 96.3  PLT 199 191 249 256 431   Basic Metabolic Panel: Recent Labs  Lab 09/25/19 2132 09/26/19 0554 09/28/19 0837 09/30/19 0417 10/01/19 0522  NA 144 142 143 142 144  K 3.9 3.5 4.5 3.6 4.0  CL 103 99 101 103 104  CO2 33* 31 30 30  32  GLUCOSE 136* 231* 162* 131* 96  BUN 22 20 31* 34*  34*  CREATININE 0.85 0.85 0.68 0.64 0.65  CALCIUM 9.1 8.8* 9.2 8.6* 8.6*   GFR: Estimated Creatinine Clearance: 51.6 mL/min (by C-G formula based on SCr of 0.65 mg/dL). Liver Function Tests: Recent Labs  Lab 09/25/19 2132  AST 25  ALT 19  ALKPHOS 55  BILITOT 0.5  PROT 6.8  ALBUMIN 3.9   Recent Labs  Lab 09/25/19 2332  LIPASE 61*   No results for input(s): AMMONIA in the last 168 hours. Coagulation Profile: Recent Labs  Lab 09/25/19 2132  INR 0.9   Cardiac Enzymes: No results for input(s): CKTOTAL, CKMB, CKMBINDEX, TROPONINI in the last 168 hours. BNP (last 3 results) No results for input(s): PROBNP in the last 8760 hours. HbA1C: No results for input(s): HGBA1C in the last 72 hours. CBG: No results for input(s): GLUCAP in the last 168 hours. Lipid Profile: No results for input(s): CHOL, HDL, LDLCALC, TRIG, CHOLHDL, LDLDIRECT in the last 72 hours. Thyroid Function Tests: No results for input(s): TSH, T4TOTAL, FREET4, T3FREE, THYROIDAB in the last 72 hours. Anemia Panel: No results for input(s): VITAMINB12, FOLATE, FERRITIN,  TIBC, IRON, RETICCTPCT in the last 72 hours. Sepsis Labs: No results for input(s): PROCALCITON, LATICACIDVEN in the last 168 hours.  Recent Results (from the past 240 hour(s))  SARS Coronavirus 2 by RT PCR (hospital order, performed in [facility] hospital lab) Nasopharyngeal Nasopharyngeal Swab     Status: None   Collection Time: 09/25/19  9:32 PM   Specimen: Nasopharyngeal Swab  Result Value Ref Range Status   SARS Coronavirus 2 NEGATIVE NEGATIVE Final    Comment: (NOTE) SARS-CoV-2 target nucleic acids are NOT DETECTED.  The SARS-CoV-2 RNA is generally detectable in upper and lower respiratory specimens during the acute phase of infection. The lowest concentration of SARS-CoV-2 viral copies this assay can detect is 250 copies / mL. A negative result does not preclude SARS-CoV-2 infection and should not be used as the sole basis for  treatment or other patient management decisions.  A negative result may occur with improper specimen collection / handling, submission of specimen other than nasopharyngeal swab, presence of viral mutation(s) within the areas targeted by this assay, and inadequate number of viral copies (<250 copies / mL). A negative result must be combined with clinical observations, patient history, and epidemiological information.  Fact Sheet for Patients:   StrictlyIdeas.no  Fact Sheet for Healthcare Providers: BankingDealers.co.za  This test is not yet approved or  cleared by the [place] FDA and has been authorized for detection and/or diagnosis of SARS-CoV-2 by FDA under an Emergency Use Authorization (EUA).  This EUA will remain in effect (meaning this test can be used) for the duration of the COVID-19 declaration under Section 564(b)(1) of the Act, 21 U.S.C. section 360bbb-3(b)(1), unless the authorization is terminated or revoked sooner.  Performed at [facility], [address] [place], [place] [address]          Radiology Studies: No results found.      Scheduled Meds: . arformoterol  15 mcg Nebulization BID  . atorvastatin  20 mg Oral QHS  . budesonide (PULMICORT) nebulizer solution  0.25 mg Nebulization BID  . busPIRone  5 mg Oral BID  . calcium carbonate  1 tablet Oral Daily  . calcium-vitamin D  1 tablet Oral Daily  . docusate sodium  100 mg Oral BID  . escitalopram  20 mg Oral Daily  . guaiFENesin  600 mg Oral BID  . ipratropium-albuterol  3 mL Nebulization Q4H  . levothyroxine  50 mcg Oral Q M,W,F  . levothyroxine  75 mcg Oral Once per day on Sun Thu Sat  . LORazepam  0.5 mg Oral BID  . methylPREDNISolone (SOLU-MEDROL) injection  40 mg Intravenous Q12H  . mirtazapine  30 mg Oral QHS  . multivitamin with minerals  1 tablet Oral Daily  . perphenazine  8 mg Oral QHS  . roflumilast  250 mcg Oral Daily   . theophylline  100 mg Oral QHS  . tiotropium  18 mcg Inhalation Daily   Continuous Infusions: . azithromycin 500 mg (10/01/19 0948)     LOS: 5 days    Time spent: 35 minutes    Sidney Ace, MD [facility] Pager [phone]  If 7PM-7AM, please contact night-coverage 10/01/2019, 10:35 AM

## 2019-10-01 NOTE — Evaluation (Signed)
Occupational Therapy Evaluation Patient Details Name: Melissa George MRN: [id number] DOB: Nov 15, 1950 Today's Date: 10/01/2019    History of Present Illness Patient is a 69 year old female with history of anxiety, depression, COPD who presents with acute onset of dyspnea. Patient uses 2-3 L O2 at baseline. Found to have acute exacerbation of COPD, acute on chronic hypoxic respiratory failure, left lung nodule. Per chart patient has psychiatric history with borderline personality disorder and severe depression and currently lives in a group home.   Clinical Impression   Ms Melissa George presents awake in bed, watching tv this afternoon on 4L O2 (baseline is 3L O2 per pt). She was initially hesitant for evaluation, verbalizing anxiety and feeling overwhelmed. She is afraid of being unable to return to the group home where she lives. Therapist provided therapeutic listening, reassurance and prayer, for which pt was appreciative. Educated pt on the role of OT in acute care setting. Prior to hospitalization, pt required intermittent assistance from caregiver for dressing/bathing and uses a Rollator for ambulation. Overall, pt presents with increased SOB limiting her activity tolerance, decreased balance and generalized weakness impacting her ability to complete ADL safely and at highest level of independence. She completed bed mobility with Supervision, was CGA and HHA intermittently for functional mobility, with one instance of knees buckling. Vitals monitored throughout (see below for details). Pt will benefit from skilled acute OT therapy while in house to address energy conservation strategies, balance and safe DME/AE use in order to maximize pt independence and reduce safety risks. Recommend HHOT upon discharge to maximize pt outcomes.     Follow Up Recommendations  Home health OT;Supervision - Intermittent    Equipment Recommendations  None recommended by OT    Recommendations for Other Services        Precautions / Restrictions Precautions Precautions: Fall Restrictions Weight Bearing Restrictions: No Other Position/Activity Restrictions: monitor vitals throughout      Mobility Bed Mobility Overal bed mobility: Needs Assistance Bed Mobility: Supine to Sit;Sit to Supine     Supine to sit: HOB elevated;Supervision Sit to supine: HOB elevated;Supervision   General bed mobility comments: increased time and vc's provided for breathing pattern  Transfers Overall transfer level: Needs assistance Equipment used: 1 person hand held assist Transfers: Sit to/from Stand Sit to Stand: Min guard         General transfer comment: Min guard for safety from standing from bed and from standard toilet, 1 instance of knees buckling at EOB, successful reattempt following    Balance Overall balance assessment: Needs assistance Sitting-balance support: Feet supported;No upper extremity supported Sitting balance-Leahy Scale: Good Sitting balance - Comments: steady static and dynamic sitting balance during seated hygiene Postural control: Left lateral lean (slight left lateral lean initially) Standing balance support: No upper extremity supported;During functional activity Standing balance-Leahy Scale: Fair Standing balance comment: one instance of knees buckling on initial stand, HHA and CGA provided intermittently to correct general unsteadiness                           ADL either performed or assessed with clinical judgement   ADL Overall ADL's : Needs assistance/impaired     Grooming: Wash/dry hands;Standing;Min guard Grooming Details (indicate cue type and reason): stood at bathroom sink while washing hands with CGA and HHA for balance support             Lower Body Dressing: Min guard;Sit to/from stand Lower Body Dressing Details (indicate  cue type and reason): pt donned underwear sitting at toilet with Supervision, Min Guard for safe sit to stand,  readjusting underwear without physical assist; RR increased to 34 when bending, SpO2 93% Toilet Transfer: Nature conservation officer;Ambulation Toilet Transfer Details (indicate cue type and reason): Min Guard provided for safety and HHA used for balance support Toileting- Clothing Manipulation and Hygiene: Set up;Sitting/lateral lean Toileting - Clothing Manipulation Details (indicate cue type and reason): completed pericare with Setup A only     Functional mobility during ADLs: Min guard General ADL Comments: Pt becomes increasingly SOB during ADL, on 4L O2 and SpO2 >93% throughout. Resting: SpO2 96%, 92 bpm HR and 18 RR. During ADL: SpO2 93%, 113 bpm and 34 RR.     Vision         Perception     Praxis      Pertinent Vitals/Pain Pain Assessment: No/denies pain     Hand Dominance Right   Extremity/Trunk Assessment Upper Extremity Assessment Upper Extremity Assessment: Generalized weakness   Lower Extremity Assessment Lower Extremity Assessment: Generalized weakness   Cervical / Trunk Assessment Cervical / Trunk Assessment: Normal   Communication Communication Communication: No difficulties   Cognition Arousal/Alertness: Awake/alert Behavior During Therapy: Anxious;WFL for tasks assessed/performed Overall Cognitive Status: Within Functional Limits for tasks assessed                                 General Comments: Pt was A&O x4 and performed appropriately throughout session, though visibly anxious. Verbalized anxiety about not being able to return to group home. Her affect was occasionally flat with increased time for responses.   General Comments       Exercises Other Exercises: Educated pt re: pursed lip breathing, energy conservation strategies and safe adaptive equipment use Other Exercises: Therapist provided pt with therapeutic listening and prayer in response to pt anxiety, fear and feeling overwhelmed. Pt verbalized appreciation.   Shoulder  Instructions      Home Living Family/patient expects to be discharged to:: Group home                                 Additional Comments: patient reports 3-4 steps with railling to enter      Prior Functioning/Environment Level of Independence: Needs assistance  Gait / Transfers Assistance Needed: rollator used for ambulation, has had 1 prior fall but she cannot remember when ADL's / Homemaking Assistance Needed: reports needing some assistance for bathing and dressing 2/2 SOB and oxygen tubing, caregiver at group home assists her, caregiver takes care of IADL   Comments: rollator used for ambulation. assistance required for bathing and intermittent assistance required for dressing per patient report.         OT Problem List: Cardiopulmonary status limiting activity;Decreased strength;Impaired balance (sitting and/or standing);Decreased knowledge of use of DME or AE      OT Treatment/Interventions: Self-care/ADL training;Therapeutic exercise;Therapeutic activities;Energy conservation;DME and/or AE instruction;Patient/family education;Balance training    OT Goals(Current goals can be found in the care plan section) Acute Rehab OT Goals Patient Stated Goal: To be able to go back to her group home OT Goal Formulation: With patient Time For Goal Achievement: 10/15/19 Potential to Achieve Goals: Good  OT Frequency: Min 2X/week   Barriers to D/C:            Co-evaluation  AM-PAC OT "6 Clicks" Daily Activity     Outcome Measure Help from another person eating meals?: None Help from another person taking care of personal grooming?: None Help from another person toileting, which includes using toliet, bedpan, or urinal?: A Little Help from another person bathing (including washing, rinsing, drying)?: A Little Help from another person to put on and taking off regular upper body clothing?: None Help from another person to put on and taking off regular  lower body clothing?: A Little 6 Click Score: 21   End of Session Equipment Utilized During Treatment: Gait belt Nurse Communication: Mobility status;Other (comment) (bedrail left down per pt needs)  Activity Tolerance: Patient tolerated treatment well Patient left: in bed;with call bell/phone within reach;with bed alarm set  OT Visit Diagnosis: Unsteadiness on feet (R26.81);History of falling (Z91.81);Muscle weakness (generalized) (M62.81)                Time: 1512-1600 OT Time Calculation (min): 48 min Charges:  OT General Charges $OT Visit: 1 Visit OT Evaluation $OT Eval Moderate Complexity: 1 Mod OT Treatments $Self Care/Home Management : 23-37 mins  Jerilynn Birkenhead, OTS 10/01/19, 4:57 PM

## 2019-10-01 NOTE — TOC Initial Note (Signed)
Transition of Care [facility]) - Initial/Assessment Note    Patient Details  Name: Melissa George MRN: [id number] Date of Birth: 1950-08-29  Transition of Care [facility]) CM/SW Contact:    Shelbie Ammons, RN Phone Number: 10/01/2019, 4:10 PM  Clinical Narrative:       RNCM received notification from MD that he was told patient has legal guardian. RNCM placed call to DSS and was able to get contact information for Ermalinda Barrios at [phone] or [phone]. DSS is patient's legal guardian and Ms. Feliciana Rossetti is her assigned worker. She relays that patient is a resident of a family care home owned by [facility] and it will be the plan for her to return there. She also is agreeable to home health services being set up and is ok with whichever preferred agency accepts her insurance. RNCM reached out to [facility] with [facility] and he will accept referral.     Expected Discharge Plan: [place] Barriers to Discharge: Continued Medical Work up   Patient Goals and CMS Choice        Expected Discharge Plan and Services Expected Discharge Plan: [place]   Discharge Planning Services: CM Consult   Living arrangements for the past 2 months: [place]                           HH Arranged: RN, PT [facility] Agency: [place] ([place]) Date HH Agency Contacted: 10/01/19 Time HH Agency Contacted: 90 Representative spoke with at [place]: Corene Cornea  Prior Living Arrangements/Services Living arrangements for the past 2 months: [place] Lives with:: Facility Resident                   Activities of Daily Living Home Assistive Devices/Equipment: Environmental consultant (specify type) ADL Screening (condition at time of admission) Patient's cognitive ability adequate to safely complete daily activities?: Yes Is the patient deaf or have difficulty hearing?: No Does the patient have difficulty seeing, even when wearing glasses/contacts?: No Does  the patient have difficulty concentrating, remembering, or making decisions?: No Patient able to express need for assistance with ADLs?: Yes Does the patient have difficulty dressing or bathing?: No Independently performs ADLs?: Yes (appropriate for developmental age) Does the patient have difficulty walking or climbing stairs?: Yes Weakness of Legs: None Weakness of Arms/Hands: None  Permission Sought/Granted                  Emotional Assessment       Orientation: : Oriented to Self, Oriented to Place Alcohol / Substance Use: Not Applicable Psych Involvement: No (comment)  Admission diagnosis:  Shortness of breath [R06.02] Lung nodule [R91.1] COPD exacerbation (HCC) [J44.1] Lower abdominal pain [R10.30] Constipation, unspecified constipation type [K59.00] Patient Active Problem List   Diagnosis Date Noted  . COPD exacerbation ([place]) 09/26/2019  . Depression, major, recurrent, severe with psychosis ([place]) 08/01/2017  . Coronary artery disease 11/06/2014  . NSTEMI (non-ST elevated myocardial infarction) ([place]) 10/29/2014  . Protein-calorie malnutrition, severe ([place]) 10/29/2014  . 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 Delusional disorder, persecutory type ([place]) 09/24/2014  . COPD (chronic obstructive pulmonary disease) ([place]) 09/12/2014  . GAD (generalized anxiety disorder)    PCP:  Patient, No Pcp Per Pharmacy:   [place] [place] [address] [place] [address] Phone: [phone] Fax: [phone]     Social  Determinants of Health (SDOH) Interventions    Readmission Risk Interventions No flowsheet data found.

## 2019-10-02 DIAGNOSIS — Z515 Encounter for palliative care: Secondary | ICD-10-CM

## 2019-10-02 DIAGNOSIS — F329 Major depressive disorder, single episode, unspecified: Secondary | ICD-10-CM

## 2019-10-02 DIAGNOSIS — R918 Other nonspecific abnormal finding of lung field: Secondary | ICD-10-CM

## 2019-10-02 DIAGNOSIS — E039 Hypothyroidism, unspecified: Secondary | ICD-10-CM

## 2019-10-02 DIAGNOSIS — Z7189 Other specified counseling: Secondary | ICD-10-CM

## 2019-10-02 DIAGNOSIS — R0602 Shortness of breath: Secondary | ICD-10-CM

## 2019-10-02 DIAGNOSIS — E785 Hyperlipidemia, unspecified: Secondary | ICD-10-CM

## 2019-10-02 DIAGNOSIS — F418 Other specified anxiety disorders: Secondary | ICD-10-CM

## 2019-10-02 LAB — BASIC METABOLIC PANEL
Anion gap: 4 — ABNORMAL LOW (ref 5–15)
BUN: 38 mg/dL — ABNORMAL HIGH (ref 8–23)
CO2: 35 mmol/L — ABNORMAL HIGH (ref 22–32)
Calcium: 8.7 mg/dL — ABNORMAL LOW (ref 8.9–10.3)
Chloride: 104 mmol/L (ref 98–111)
Creatinine, Ser: 0.56 mg/dL (ref 0.44–1.00)
GFR calc Af Amer: >60 mL/min (ref >60)
GFR calc non Af Amer: >60 mL/min (ref >60)
Glucose, Bld: 118 mg/dL — ABNORMAL HIGH (ref 70–99)
Potassium: 4.4 mmol/L (ref 3.5–5.1)
Sodium: 143 mmol/L (ref 135–145)

## 2019-10-02 LAB — CBC WITH DIFFERENTIAL/PLATELET
Abs Immature Granulocytes: 0.34 10*3/uL — ABNORMAL HIGH (ref 0.00–0.07)
Basophils Absolute: 0 10*3/uL (ref 0.0–0.1)
Basophils Relative: 0 %
Eosinophils Absolute: 0 10*3/uL (ref 0.0–0.5)
Eosinophils Relative: 0 %
HCT: 36.5 % (ref 36.0–46.0)
Hemoglobin: 12.1 g/dL (ref 12.0–15.0)
Immature Granulocytes: 2 %
Lymphocytes Relative: 9 %
Lymphs Abs: 1.3 10*3/uL (ref 0.7–4.0)
MCH: 31.2 pg (ref 26.0–34.0)
MCHC: 33.2 g/dL (ref 30.0–36.0)
MCV: 94.1 fL (ref 80.0–100.0)
Monocytes Absolute: 1.2 10*3/uL — ABNORMAL HIGH (ref 0.1–1.0)
Monocytes Relative: 9 %
Neutro Abs: 11.2 10*3/uL — ABNORMAL HIGH (ref 1.7–7.7)
Neutrophils Relative %: 80 %
Platelets: 261 10*3/uL (ref 150–400)
RBC: 3.88 MIL/uL (ref 3.87–5.11)
RDW: 12.5 % (ref 11.5–15.5)
WBC: 14.1 10*3/uL — ABNORMAL HIGH (ref 4.0–10.5)
nRBC: 0 % (ref 0.0–0.2)

## 2019-10-02 MED ORDER — POLYETHYLENE GLYCOL 3350 17 G PO PACK
17.0000 g | PACK | Freq: Every day | ORAL | Status: DC
  Administered 2019-10-02 – 2019-10-04 (×3): 17 g via ORAL
  Filled 2019-10-02 (×3): qty 1

## 2019-10-02 MED ORDER — NYSTATIN 100000 UNIT/ML MT SUSP
5.0000 mL | Freq: Four times a day (QID) | OROMUCOSAL | Status: DC
  Administered 2019-10-02 – 2019-10-04 (×8): 500000 [IU] via ORAL
  Filled 2019-10-02 (×7): qty 5

## 2019-10-02 MED ORDER — MORPHINE SULFATE (CONCENTRATE) 10 MG/0.5ML PO SOLN
5.0000 mg | ORAL | Status: DC | PRN
  Administered 2019-10-02 – 2019-10-03 (×2): 5 mg via SUBLINGUAL
  Filled 2019-10-02 (×2): qty 0.5

## 2019-10-02 NOTE — TOC Progression Note (Addendum)
Transition of Care [facility]) - Progression Note    Patient Details  Name: Melissa George MRN: [id number] Date of Birth: 05/25/1950  [facility]) CM/SW Contact  Shelbie Hutching, RN Phone Number: 10/02/2019, 4:33 PM  Clinical Narrative:    Patient's legal guardian with DSS would like for the patient to get hospice services and has no preference in agency.  Clement at the group home has been updated and notified of discharge plan with Hospice.  [name] is good with using [facility] for Hospice services.  [name] reports that the patient has no DME needs at this time.  [name] will pick the patient up at discharge.  This RNCM verified with [name] that patient does have oxygen at home.   Expected Discharge Plan: Home w Hospice Care Barriers to Discharge: Continued Medical Work up  Expected Discharge Plan and Services Expected Discharge Plan: [place]   Discharge Planning Services: CM Consult Post Acute Care Choice: Hospice Living arrangements for the past 2 months: [place]                           HH Arranged: RN, PT [facility] Agency: [place] ([place]) Date [place]: 10/01/19 Time [place]: 1530 Representative spoke with at [place]: [place] ([place]) Interventions    Readmission Risk Interventions No flowsheet data found.

## 2019-10-02 NOTE — Care Plan (Signed)
Requested chaplain to come to pt's room to pray for her.  She doesn't want to talk.  She appears very sad/depressed and told me tonight "I believe I'm dying". Concerned that she won't be able to go back to the the group home because they won't be able to take care of her.  Explained that I'd read she's going to back to the group home but that hospice will also be a part of her care team.

## 2019-10-02 NOTE — Progress Notes (Addendum)
[facility] Liaison note:  New referral for [facility] hospice services to follow Ms Scarfo at her group home received from [place]. DSS guardian noted. Patient information given to referral. Hospice eligibility confirmed.  PCP needs to be confirmed as there is none listed in Epic.  Liaison to follow up with hospital care team in the morning. Thank you for this referral. Flo Shanks BSN, RN, [place] [phone]
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
## 2019-10-02 NOTE — Progress Notes (Signed)
Patient ID: Melissa George, female   DOB: June 09, 1950, 69 y.o.   MRN: [id number] [facility] PROGRESS NOTE  Melissa George VZD:[id number] DOB: 01-02-1951 DOA: 09/25/2019 PCP: Patient, No Pcp Per  HPI/Subjective: Patient came into the hospital with abdominal pain and shortness of breath.  Patient being treated for COPD exacerbation.  Patient not feeling that good.  Complains of shortness of breath and wheezing.  Not much cough.  Objective: Vitals:   10/02/19 1119 10/02/19 1120  BP: (!) 114/42   Pulse: (!) 104 98  Resp: 17 (!) 21  Temp: 97.8 F (36.6 C)   SpO2: 96%     Intake/Output Summary (Last 24 hours) at 10/02/2019 1452 Last data filed at 10/02/2019 1415 Gross per 24 hour  Intake 240 ml  Output --  Net 240 ml   Filed Weights   09/25/19 2125 09/26/19 0831  Weight: 50 kg 50 kg    ROS: Review of Systems  Respiratory: Positive for shortness of breath and wheezing. Negative for cough.   Cardiovascular: Negative for chest pain.  Gastrointestinal: Positive for abdominal pain and constipation.   Exam: Physical Exam  HENT:  Nose: No mucosal edema.  Mouth/Throat: No oropharyngeal exudate.  Eyes: Pupils are equal, round, and reactive to light. Conjunctivae and lids are normal.  Cardiovascular: S1 normal and S2 normal. Exam reveals no gallop.  No murmur heard. Respiratory: No respiratory distress. She has decreased breath sounds in the right lower field and the left lower field. She has no wheezes. She has rhonchi in the right lower field and the left lower field. She has no rales.  GI: Soft. Bowel sounds are normal. There is no abdominal tenderness.  Musculoskeletal:     Right ankle: No swelling.     Left ankle: No swelling.  Neurological: She is alert.  Skin: Skin is warm. No rash noted. Nails show no clubbing.      Data Reviewed: Basic Metabolic Panel: Recent Labs  Lab 09/26/19 0554 09/28/19 0837 09/30/19 0417 10/01/19 0522 10/02/19 0457  NA 142 143  142 144 143  K 3.5 4.5 3.6 4.0 4.4  CL 99 101 103 104 104  CO2 31 30 30  32 35*  GLUCOSE 231* 162* 131* 96 118*  BUN 20 31* 34* 34* 38*  CREATININE 0.85 0.68 0.64 0.65 0.56  CALCIUM 8.8* 9.2 8.6* 8.6* 8.7*   Liver Function Tests: Recent Labs  Lab 09/25/19 2132  AST 25  ALT 19  ALKPHOS 55  BILITOT 0.5  PROT 6.8  ALBUMIN 3.9   Recent Labs  Lab 09/25/19 2332  LIPASE 61*   CBC: Recent Labs  Lab 09/25/19 2132 09/25/19 2132 09/26/19 0554 09/28/19 0837 09/30/19 0417 10/01/19 0522 10/02/19 0457  WBC 11.2*   < > 9.9 11.0* 11.8* 12.6* 14.1*  NEUTROABS 9.3*  --   --  9.7* 8.4* 9.1* 11.2*  HGB 11.7*   < > 11.4* 12.4 11.7* 11.9* 12.1  HCT 35.0*   < > 35.3* 37.7 35.0* 36.7 36.5  MCV 93.3   < > 96.7 95.2 94.1 96.3 94.1  PLT 199   < > 191 249 256 260 261   < > = values in this interval not displayed.   BNP (last 3 results) Recent Labs    09/25/19 2132  BNP 25.2     Recent Results (from the past 240 hour(s))  SARS Coronavirus 2 by RT PCR (hospital order, performed in [facility] hospital lab) Nasopharyngeal Nasopharyngeal Swab     Status:  None   Collection Time: 09/25/19  9:32 PM   Specimen: Nasopharyngeal Swab  Result Value Ref Range Status   SARS Coronavirus 2 NEGATIVE NEGATIVE Final    Comment: (NOTE) SARS-CoV-2 target nucleic acids are NOT DETECTED.  The SARS-CoV-2 RNA is generally detectable in upper and lower respiratory specimens during the acute phase of infection. The lowest concentration of SARS-CoV-2 viral copies this assay can detect is 250 copies / mL. A negative result does not preclude SARS-CoV-2 infection and should not be used as the sole basis for treatment or other patient management decisions.  A negative result may occur with improper specimen collection / handling, submission of specimen other than nasopharyngeal swab, presence of viral mutation(s) within the areas targeted by this assay, and inadequate number of viral copies (<250 copies /  mL). A negative result must be combined with clinical observations, patient history, and epidemiological information.  Fact Sheet for Patients:   StrictlyIdeas.no  Fact Sheet for Healthcare Providers: BankingDealers.co.za  This test is not yet approved or  cleared by the [place] FDA and has been authorized for detection and/or diagnosis of SARS-CoV-2 by FDA under an Emergency Use Authorization (EUA).  This EUA will remain in effect (meaning this test can be used) for the duration of the COVID-19 declaration under Section 564(b)(1) of the Act, 21 U.S.C. section 360bbb-3(b)(1), unless the authorization is terminated or revoked sooner.  Performed at [facility], [address] [place], [place] [address]      Scheduled Meds: . arformoterol  15 mcg Nebulization BID  . atorvastatin  20 mg Oral QHS  . budesonide (PULMICORT) nebulizer solution  0.25 mg Nebulization BID  . busPIRone  5 mg Oral BID  . calcium carbonate  1 tablet Oral Daily  . calcium-vitamin D  1 tablet Oral Daily  . docusate sodium  100 mg Oral BID  . escitalopram  20 mg Oral Daily  . guaiFENesin  600 mg Oral BID  . ipratropium-albuterol  3 mL Nebulization Q4H  . levothyroxine  50 mcg Oral Q M,W,F  . levothyroxine  75 mcg Oral Once per day on Sun Thu Sat  . LORazepam  0.5 mg Oral BID  . methylPREDNISolone (SOLU-MEDROL) injection  40 mg Intravenous Q12H  . mirtazapine  30 mg Oral QHS  . multivitamin with minerals  1 tablet Oral Daily  . nystatin  5 mL Oral QID  . perphenazine  8 mg Oral QHS  . polyethylene glycol  17 g Oral Daily  . roflumilast  250 mcg Oral Daily  . theophylline  100 mg Oral QHS  . tiotropium  18 mcg Inhalation Daily   Continuous Infusions:  Assessment/Plan:  1. COPD exacerbation.  Continue nebulizer treatments and Solu-Medrol.  Patient already received Zithromax and Rocephin. 2. Spiculated lung nodule on CT scan of the chest.   Will need outpatient work-up for this.  Palliative care said that guardian would like to follow-up with hospice. 3. Hypothyroidism unspecified on Synthroid 4. Depression on Lexapro, Remeron and Ativan 5. Hyperlipidemia unspecified on atorvastatin 6. Weakness physical therapy evaluation appreciated recommended home with home health 7. Abdominal pain and constipation.  Start MiraLAX.      Code Status:     Code Status Orders  (From admission, onward)         Start     Ordered   09/26/19 0137  Do not attempt resuscitation (DNR)  Continuous       Question Answer Comment  In the event of  cardiac or respiratory ARREST Do not call a "code blue"   In the event of cardiac or respiratory ARREST Do not perform Intubation, CPR, defibrillation or ACLS   In the event of cardiac or respiratory ARREST Use medication by any route, position, wound care, and other measures to relive pain and suffering. May use oxygen, suction and manual treatment of airway obstruction as needed for comfort.      09/26/19 0136        Code Status History    Date Active Date Inactive Code Status Order ID Comments User Context   08/01/2017 1308 08/10/2017 1331 DNR [id number]  Bettey Costa, MD ED   11/06/2014 2158 11/15/2014 1127 Full Code [id number]  Clapacs, Madie Reno, MD Inpatient   11/01/2014 1841 11/05/2014 2135 Full Code [id number]  Gonzella Lex, MD Inpatient   10/29/2014 0225 11/01/2014 1841 Full Code [id number]  Hower, Aaron Mose, MD Inpatient   09/13/2014 0657 10/29/2014 0225 Full Code [id number]  Clapacs, Madie Reno, MD Inpatient   03/11/2014 1348 03/12/2014 0325 Full Code [id number]  Camprubi-Soms, Patty Sermons, PA-C ED   Advance Care Planning Activity     Family Communication: Left message for guardian Disposition Plan: Status is: Inpatient  Dispo: The patient is from: Group home              Anticipated d/c is to: Group home              Anticipated d/c date is: Potentially 10/03/2019 versus 10/04/2019 depending on clinical  course              Patient currently being treated for COPD exacerbation requiring IV Solu-Medrol and nebulizer treatments for better air entry.  Consultants:  Cardiology  Time spent: 28 minutes  [place]

## 2019-10-02 NOTE — Progress Notes (Signed)
[place] paged by RN to attempt visit w/pt. --> pt. is dejected and not wanting to talk w/staff, has fears re: dying and not being able to go back to her group home @ discharge, per RN.  [place] visited pt. and found her curled up in bed under several blankets watching TV with the lights off.  Pt. did not respond to [facility] greeting or verbalize current feelings.  [place] told Pt. she was in [facility] prayers and that [place] remains available tonight if pt. has any need to talk or for support; [place] also brought pt. prayer shawl per RN suggestion, explaining that this is a reminder that pt. is covered in prayer; Pt. asked, 'Is this for me to keep?' but did not say anything further.  [facility] remains available tonight and will make unit chaplain aware of pt. in AM in case further support is needed.    10/02/19 2200  Clinical Encounter Type  Visited With Patient;Health care provider  Visit Type Initial;Psychological support;Social support  Referral From Nurse  Consult/Referral To Chaplain  Spiritual Encounters  Spiritual Needs Emotional;Other (Comment) (Prayer Shawl)  Stress Factors  Patient Stress Factors Other (Comment) (Overwhelmed)

## 2019-10-03 DIAGNOSIS — K5900 Constipation, unspecified: Secondary | ICD-10-CM

## 2019-10-03 DIAGNOSIS — Z66 Do not resuscitate: Secondary | ICD-10-CM

## 2019-10-03 LAB — BASIC METABOLIC PANEL
Anion gap: 9 (ref 5–15)
BUN: 39 mg/dL — ABNORMAL HIGH (ref 8–23)
CO2: 31 mmol/L (ref 22–32)
Calcium: 8.4 mg/dL — ABNORMAL LOW (ref 8.9–10.3)
Chloride: 101 mmol/L (ref 98–111)
Creatinine, Ser: 0.72 mg/dL (ref 0.44–1.00)
GFR calc Af Amer: >60 mL/min (ref >60)
GFR calc non Af Amer: >60 mL/min (ref >60)
Glucose, Bld: 130 mg/dL — ABNORMAL HIGH (ref 70–99)
Potassium: 4.7 mmol/L (ref 3.5–5.1)
Sodium: 141 mmol/L (ref 135–145)

## 2019-10-03 MED ORDER — IPRATROPIUM-ALBUTEROL 0.5-2.5 (3) MG/3ML IN SOLN
3.0000 mL | Freq: Three times a day (TID) | RESPIRATORY_TRACT | Status: DC
  Administered 2019-10-04: 07:00:00 3 mL via RESPIRATORY_TRACT
  Filled 2019-10-03 (×2): qty 3

## 2019-10-03 NOTE — Care Management Important Message (Signed)
Important Message  Patient Details  Name: JERLEAN PERALTA MRN: [id number] Date of Birth: Nov 07, 1950   Medicare Important Message Given:  Other (see comment)  Left message for Ermalinda Barrios, patient's guardian with [place] DSS [phone]) to call me to review Important Message from Medicare.  Juliann Pulse A Yahayra Geis 10/03/2019, 10:24 AM

## 2019-10-03 NOTE — Progress Notes (Addendum)
[facility] hospital liaison note:  Plan is for patient to discharge tomorrow. Writer spoke via telephone to patient's son Melissa George, he was appreciative of the call and is glad for the support hospice will give his mother. Patient is a bit more talkative today. No s/s of dyspnea noted. Emotional support given. Will continue to follow through discharge. Flo Shanks BSN, RN, [place] [phone]

## 2019-10-03 NOTE — Progress Notes (Signed)
   10/03/19 0945  Clinical Encounter Type  Visited With Patient  Visit Type Follow-up;Spiritual support;Social support  Referral From Chaplain  Consult/Referral To Chaplain  Ch follow-up with Pt. Pt told Ch she was afraid of dying. Pt also said she was afraid of not having anywhere to live. Ch prayed for Pt. Ch also talked to the nurse about Pt concerns. Nurse assured Pt she had somewhere to live. Ch will follow-up with PT.

## 2019-10-03 NOTE — Progress Notes (Addendum)
Daily Progress Note   Patient Name: Melissa George       Date: 10/03/2019 DOB: [birth year]  Age: 69 y.o. MRN#: [id number] Attending Physician: Loletha Grayer, MD Primary Care Physician: Patient, No Pcp Per Admit Date: 09/25/2019  Reason for Consultation/Follow-up: Establishing goals of care and Hospice Evaluation  Subjective: Appears less dyspneic today - no accessory muscle use or pursed lip breathing this morning, tells me she a bit better - when asked if roxanol was helpful she tells me "a little bit"  Length of Stay: 7  Current Medications: Scheduled Meds:  . arformoterol  15 mcg Nebulization BID  . atorvastatin  20 mg Oral QHS  . budesonide (PULMICORT) nebulizer solution  0.25 mg Nebulization BID  . busPIRone  5 mg Oral BID  . calcium carbonate  1 tablet Oral Daily  . calcium-vitamin D  1 tablet Oral Daily  . docusate sodium  100 mg Oral BID  . escitalopram  20 mg Oral Daily  . guaiFENesin  600 mg Oral BID  . ipratropium-albuterol  3 mL Nebulization Q4H  . levothyroxine  50 mcg Oral Q M,W,F  . levothyroxine  75 mcg Oral Once per day on Sun Thu Sat  . LORazepam  0.5 mg Oral BID  . methylPREDNISolone (SOLU-MEDROL) injection  40 mg Intravenous Q12H  . mirtazapine  30 mg Oral QHS  . multivitamin with minerals  1 tablet Oral Daily  . nystatin  5 mL Oral QID  . perphenazine  8 mg Oral QHS  . polyethylene glycol  17 g Oral Daily  . roflumilast  250 mcg Oral Daily  . theophylline  100 mg Oral QHS  . tiotropium  18 mcg Inhalation Daily    Continuous Infusions:   PRN Meds: acetaminophen **OR** acetaminophen, baclofen, bisacodyl, chlorpheniramine-HYDROcodone, hydrOXYzine, lactulose, linaclotide, loperamide, magnesium hydroxide, morphine CONCENTRATE, ondansetron **OR** ondansetron (ZOFRAN)  IV, sodium phosphate, traZODone, zolpidem  Physical Exam Constitutional:      General: She is not in acute distress.    Comments: frail  Skin:    General: Skin is warm and dry.  Neurological:     Mental Status: She is alert.     Comments: Orientation unclear, minimally verbal  Psychiatric:        Behavior: Behavior is withdrawn.             Vital Signs: BP (!) 113/53 (BP Location: Left Arm)   Pulse 86   Temp 97.7 F (36.5 C)   Resp 18   Ht 5\' 2"  (1.575 m)   Wt 50 kg   SpO2 100%   BMI 20.16 kg/m  SpO2: SpO2: 100 % O2 Device: O2 Device: Nasal Cannula O2 Flow Rate: O2 Flow Rate (L/min): 4 L/min  Intake/output summary:   Intake/Output Summary (Last 24 hours) at 10/03/2019 6203 Last data filed at 10/02/2019 1650 Gross per 24 hour  Intake 480 ml  Output --  Net 480 ml   LBM: Last BM Date: 10/02/19 Baseline Weight: Weight: 50 kg Most recent weight: Weight: 50 kg       Palliative Assessment/Data: PPS 40%    Flowsheet Rows     Most Recent Value  Intake Tab  Referral Department Hospitalist  Unit at Time of Referral Med/Surg Unit  Palliative Care Primary Diagnosis Pulmonary  Date Notified 10/01/19  Palliative Care Type New Palliative care  Reason for referral Clarify Goals of Care, Non-pain Symptom  Date of Admission 09/25/19  Date first seen by Palliative Care 10/02/19  # of days Palliative referral response time 1 Day(s)  # of days IP prior to Palliative referral 6  Clinical Assessment  Palliative Performance Scale Score 40%  Psychosocial & Spiritual Assessment  Palliative Care Outcomes  Patient/Family meeting held? Yes  Who was at the meeting? legal guardian  [place] goals of care, Counseled regarding hospice, Provided psychosocial or spiritual support, Transitioned to hospice      Patient Active Problem List   Diagnosis Date Noted  . Shortness of breath   . Goals of care, counseling/discussion   . Palliative care by  specialist   . Lung nodule   . Hypothyroidism   . Depression   . Hyperlipidemia   . COPD exacerbation ([place]) 09/26/2019  . Depression, major, recurrent, severe with psychosis ([place]) 08/01/2017  . Coronary artery disease 11/06/2014  . NSTEMI (non-ST elevated myocardial infarction) ([place]) 10/29/2014  . Protein-calorie malnutrition, severe ([place]) 10/29/2014  . Major depressive disorder, recurrent episode, severe with catatonia ([place]) 10/24/2014  . Delusional disorder, persecutory type ([place]) 09/24/2014  . COPD (chronic obstructive pulmonary disease) ([place]) 09/12/2014  . GAD (generalized anxiety disorder)     Palliative Care Assessment & Plan   HPI: 69 y.o. female  with past medical history of COPD, boderline personality disorder, severe depression, and anxiety admitted on 09/25/2019 with shortness of breath. Patient with minimal improvement despite medication adjustments by pulmonary - activity extremely limited. Patient has a legal guardian through [place]. PMT consulted to assist with [place].   Assessment: Symptoms better controlled today.  She remains hopeful for discharge to group home - patient and legal guardian agreeable to hospice involvement. Son has not returned call.  Addendum: Called back by son requesting update. We discussed patient's advanced COPD. He is aware of her condition and poor prognosis. He shares about patient's history of mental illness. He agrees that he would like to focus on comfort and quality of life for "whatever time is left". Agreeable to d/c with hospice.   Recommendations/Plan: Hospice at group home Continue low dose roxanol  Code Status: DNR  Prognosis:  < 6 months  Discharge Planning: group home with hospice  Care plan was discussed with patient and legal guardian  Thank you for allowing the Palliative Medicine Team to assist in the care of this patient.   Total Time 25 minutes Prolonged Time Billed  no       Greater than 50%  of this time was  spent counseling and coordinating care related to the above assessment and plan.  Juel Burrow, DNP, [facility] Palliative Medicine Team Team Phone # [phone]  Pager [phone]

## 2019-10-03 NOTE — Progress Notes (Signed)
Patient ID: Melissa George, female   DOB: [birth year], 69 y.o.   MRN: [id number] [facility] PROGRESS NOTE  Melissa George DXI:[id number] DOB: 07-01-1950 DOA: 09/25/2019 PCP: Remi Haggard, FNP  HPI/Subjective: Patient came into the hospital with abdominal pain and shortness of breath.  Patient being treated for COPD exacerbation.  Patient states that she does not feel good but unable to elaborate much on her own.  She answers yes to shortness of breath.  She answers yes to wheezing.  Objective: Vitals:   10/03/19 0813 10/03/19 1206  BP:  134/64  Pulse:  (!) 101  Resp:  14  Temp:  97.6 F (36.4 C)  SpO2: 100% 100%    Intake/Output Summary (Last 24 hours) at 10/03/2019 1510 Last data filed at 10/02/2019 1650 Gross per 24 hour  Intake 240 ml  Output --  Net 240 ml   Filed Weights   09/25/19 2125 09/26/19 0831  Weight: 50 kg 50 kg    ROS: Review of Systems  Respiratory: Positive for shortness of breath and wheezing.   Cardiovascular: Negative for chest pain.  Gastrointestinal: Negative for abdominal pain.   Exam: Physical Exam  HENT:  Nose: No mucosal edema.  Mouth/Throat: No oropharyngeal exudate.  Eyes: Pupils are equal, round, and reactive to light. Lids are normal.  Cardiovascular: Regular rhythm, S1 normal, S2 normal and normal heart sounds. Tachycardia present.    No systolic murmur is present. Respiratory: She has decreased breath sounds in the right middle field, the right lower field, the left middle field and the left lower field. She has no wheezes. She has no rhonchi. She has no rales.  GI: Soft. There is no abdominal tenderness.  Musculoskeletal:     Right ankle: No swelling.     Left ankle: No swelling.  Neurological: She is alert.      Data Reviewed: Basic Metabolic Panel: Recent Labs  Lab 09/28/19 0837 09/30/19 0417 10/01/19 0522 10/02/19 0457 10/03/19 0419  NA 143 142 144 143 141  K 4.5 3.6 4.0 4.4 4.7  CL 101 103 104 104 101   CO2 30 30 32 35* 31  GLUCOSE 162* 131* 96 118* 130*  BUN 31* 34* 34* 38* 39*  CREATININE 0.68 0.64 0.65 0.56 0.72  CALCIUM 9.2 8.6* 8.6* 8.7* 8.4*   CBC: Recent Labs  Lab 09/28/19 0837 09/30/19 0417 10/01/19 0522 10/02/19 0457  WBC 11.0* 11.8* 12.6* 14.1*  NEUTROABS 9.7* 8.4* 9.1* 11.2*  HGB 12.4 11.7* 11.9* 12.1  HCT 37.7 35.0* 36.7 36.5  MCV 95.2 94.1 96.3 94.1  PLT 249 256 260 261   BNP (last 3 results) Recent Labs    09/25/19 2132  BNP 25.2     Recent Results (from the past 240 hour(s))  SARS Coronavirus 2 by RT PCR (hospital order, performed in [facility] hospital lab) Nasopharyngeal Nasopharyngeal Swab     Status: None   Collection Time: 09/25/19  9:32 PM   Specimen: Nasopharyngeal Swab  Result Value Ref Range Status   SARS Coronavirus 2 NEGATIVE NEGATIVE Final    Comment: (NOTE) SARS-CoV-2 target nucleic acids are NOT DETECTED.  The SARS-CoV-2 RNA is generally detectable in upper and lower respiratory specimens during the acute phase of infection. The lowest concentration of SARS-CoV-2 viral copies this assay can detect is 250 copies / mL. A negative result does not preclude SARS-CoV-2 infection and should not be used as the sole basis for treatment or other patient management decisions.  A negative result  may occur with improper specimen collection / handling, submission of specimen other than nasopharyngeal swab, presence of viral mutation(s) within the areas targeted by this assay, and inadequate number of viral copies (<250 copies / mL). A negative result must be combined with clinical observations, patient history, and epidemiological information.  Fact Sheet for Patients:   StrictlyIdeas.no  Fact Sheet for Healthcare Providers: BankingDealers.co.za  This test is not yet approved or  cleared by the [place] FDA and has been authorized for detection and/or diagnosis of SARS-CoV-2 by FDA under an  Emergency Use Authorization (EUA).  This EUA will remain in effect (meaning this test can be used) for the duration of the COVID-19 declaration under Section 564(b)(1) of the Act, 21 U.S.C. section 360bbb-3(b)(1), unless the authorization is terminated or revoked sooner.  Performed at [facility], [address] [place], [place] [address]        Scheduled Meds: . arformoterol  15 mcg Nebulization BID  . atorvastatin  20 mg Oral QHS  . budesonide (PULMICORT) nebulizer solution  0.25 mg Nebulization BID  . busPIRone  5 mg Oral BID  . calcium carbonate  1 tablet Oral Daily  . calcium-vitamin D  1 tablet Oral Daily  . docusate sodium  100 mg Oral BID  . escitalopram  20 mg Oral Daily  . guaiFENesin  600 mg Oral BID  . ipratropium-albuterol  3 mL Nebulization Q4H  . levothyroxine  50 mcg Oral Q M,W,F  . levothyroxine  75 mcg Oral Once per day on Sun Thu Sat  . LORazepam  0.5 mg Oral BID  . methylPREDNISolone (SOLU-MEDROL) injection  40 mg Intravenous Q12H  . mirtazapine  30 mg Oral QHS  . multivitamin with minerals  1 tablet Oral Daily  . nystatin  5 mL Oral QID  . perphenazine  8 mg Oral QHS  . polyethylene glycol  17 g Oral Daily  . roflumilast  250 mcg Oral Daily  . theophylline  100 mg Oral QHS  . tiotropium  18 mcg Inhalation Daily   Continuous Infusions:  Assessment/Plan:  1. COPD exacerbation.  Patient very slow to make any improvement.  Appreciate palliative care consultation.  Patient will be set up to go back to her group home with hospice for tomorrow.  Patient already completed Rocephin and Zithromax here.  As needed Roxanol. 2. Spiculated lung nodule on CT scan of the chest.  Since the patient will go home with hospice no further work-up will be done for this 3. Hypothyroidism unspecified on Synthroid 4. Depression on Lexapro, Remeron and Ativan 5. Hyperlipidemia unspecified will discontinue atorvastatin 6. Abdominal pain and constipation continue  MiraLAX.    Code Status:     Code Status Orders  (From admission, onward)         Start     Ordered   09/26/19 0137  Do not attempt resuscitation (DNR)  Continuous       Question Answer Comment  In the event of cardiac or respiratory ARREST Do not call a "code blue"   In the event of cardiac or respiratory ARREST Do not perform Intubation, CPR, defibrillation or ACLS   In the event of cardiac or respiratory ARREST Use medication by any route, position, wound care, and other measures to relive pain and suffering. May use oxygen, suction and manual treatment of airway obstruction as needed for comfort.      09/26/19 0136        Code Status History  Date Active Date Inactive Code Status Order ID Comments User Context   08/01/2017 1308 08/10/2017 1331 DNR [id number]  Bettey Costa, MD ED   11/06/2014 2158 11/15/2014 1127 Full Code [id number]  Gonzella Lex, MD Inpatient   11/01/2014 1841 11/05/2014 2135 Full Code [id number]  Gonzella Lex, MD Inpatient   10/29/2014 0225 11/01/2014 1841 Full Code [id number]  Hower, Aaron Mose, MD Inpatient   09/13/2014 0657 10/29/2014 0225 Full Code [id number]  Clapacs, Madie Reno, MD Inpatient   03/11/2014 1348 03/12/2014 0325 Full Code [id number]  Camprubi-Soms, Patty Sermons, PA-C ED   Advance Care Planning Activity     Family Communication: Transitional care team spoke with son about the plan to go back to the group home with hospice Disposition Plan: Status is: Inpatient  Dispo: The patient is from: Group home              Anticipated d/c is to: Group home with hospice for tomorrow              Anticipated d/c date is: 10/04/2019 with hospice at the group home              Patient currently being treated for COPD exacerbation with slow improvement.  Patient's overall prognosis is poor and hospice will be set up at the group home for tomorrow's discharge.  Consultants:  Palliative care  Time spent: 26 minutes  [place]

## 2019-10-03 NOTE — TOC Progression Note (Signed)
Transition of Care [facility]) - Progression Note    Patient Details  Name: Melissa George MRN: [id number] Date of Birth: 1950-10-27  [facility] CM/SW Contact  Shelbie Hutching, RN Phone Number: 10/03/2019, 12:05 PM  Clinical Narrative:     Plan will be for patient to discharge tomorrow with [facility].  This RNCM updated patient's son [name], who verbalizes understanding of discharge plans.  Clement at the group home also updated.  [name] will pick the patient up at discharge tomorrow.   Expected Discharge Plan: Home w Hospice Care Barriers to Discharge: Continued Medical Work up  Expected Discharge Plan and Services Expected Discharge Plan: [place]   Discharge Planning Services: CM Consult Post Acute Care Choice: Hospice Living arrangements for the past 2 months: [place]                           HH Arranged: RN, PT [facility] Agency: [place] ([place]) Date [place]: 10/01/19 Time [place]: 1530 Representative spoke with at [place]: [place] ([place]) Interventions    Readmission Risk Interventions No flowsheet data found.

## 2019-10-04 LAB — BASIC METABOLIC PANEL
Anion gap: 10 (ref 5–15)
BUN: 33 mg/dL — ABNORMAL HIGH (ref 8–23)
CO2: 33 mmol/L — ABNORMAL HIGH (ref 22–32)
Calcium: 8.9 mg/dL (ref 8.9–10.3)
Chloride: 99 mmol/L (ref 98–111)
Creatinine, Ser: 0.71 mg/dL (ref 0.44–1.00)
GFR calc Af Amer: >60 mL/min (ref >60)
GFR calc non Af Amer: >60 mL/min (ref >60)
Glucose, Bld: 128 mg/dL — ABNORMAL HIGH (ref 70–99)
Potassium: 4.8 mmol/L (ref 3.5–5.1)
Sodium: 142 mmol/L (ref 135–145)

## 2019-10-04 LAB — THEOPHYLLINE LEVEL: Theophylline Lvl: 2.1 ug/mL — ABNORMAL LOW (ref 10.0–20.0)

## 2019-10-04 MED ORDER — THEOPHYLLINE ER 100 MG PO CP24: 100.0000 mg | ORAL_CAPSULE | Freq: Every day | ORAL | 0 refills | Status: AC

## 2019-10-04 MED ORDER — ROFLUMILAST 500 MCG PO TABS: 250.0000 ug | ORAL_TABLET | Freq: Every day | ORAL | 0 refills | Status: DC

## 2019-10-04 MED ORDER — PANTOPRAZOLE SODIUM 40 MG PO TBEC: 40.0000 mg | DELAYED_RELEASE_TABLET | Freq: Every day | ORAL | Status: DC

## 2019-10-04 MED ORDER — PANTOPRAZOLE SODIUM 40 MG PO TBEC: 40.0000 mg | DELAYED_RELEASE_TABLET | Freq: Every day | ORAL | 0 refills | Status: AC

## 2019-10-04 MED ORDER — MORPHINE SULFATE (CONCENTRATE) 10 MG/0.5ML PO SOLN: 5.0000 mg | ORAL | 0 refills | Status: DC | PRN

## 2019-10-04 MED ORDER — POLYETHYLENE GLYCOL 3350 17 G PO PACK: 17.0000 g | PACK | Freq: Every day | ORAL | 0 refills | Status: AC

## 2019-10-04 MED ORDER — NYSTATIN 100000 UNIT/ML MT SUSP: 5.0000 mL | Freq: Four times a day (QID) | OROMUCOSAL | 0 refills | Status: AC

## 2019-10-04 MED ORDER — PREDNISONE 10 MG PO TABS
10.0000 mg | ORAL_TABLET | Freq: Every day | ORAL | 0 refills | Status: DC
Start: 2019-10-05 — End: 2020-01-30

## 2019-10-04 MED ORDER — LORAZEPAM 0.5 MG PO TABS: 0.5000 mg | ORAL_TABLET | Freq: Two times a day (BID) | ORAL | 0 refills | Status: DC

## 2019-10-04 NOTE — Discharge Summary (Signed)
[facility] NAME: Melissa George    MR#:  [id number]  DATE OF BIRTH:  Feb 28, 1951  DATE OF ADMISSION:  09/25/2019 ADMITTING PHYSICIAN: Christel Mormon, MD  DATE OF DISCHARGE: 10/04/2019  PRIMARY CARE PHYSICIAN: Remi Haggard, FNP    ADMISSION DIAGNOSIS:  Shortness of breath [R06.02] Lung nodule [R91.1] COPD exacerbation (HCC) [J44.1] Lower abdominal pain [R10.30] Constipation, unspecified constipation type [K59.00]  DISCHARGE DIAGNOSIS:  Active Problems:   COPD exacerbation (HCC)   Shortness of breath   Goals of care, counseling/discussion   Palliative care by specialist   Lung nodule   Hypothyroidism   Depression   Hyperlipidemia   DNR (do not resuscitate)   Constipation   SECONDARY DIAGNOSIS:   Past Medical History:  Diagnosis Date  . Anxiety   . Anxiety   . COPD (chronic obstructive pulmonary disease) ([place])   . 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  COPD exacerbation.  Patient very slow to make any improvement.  Appreciate palliative care consultation.  The patient will be set up to go back to the group home with hospice today.  Roxanol as needed for air hunger, shortness of breath or pain.  Patient completed Rocephin and Zithromax here.  Continue nebulizers and inhalers.  Will give chronic prednisone 10 mg daily 2.  Spiculated nodule on CT scan of the chest.  This will not be evaluated further since the patient will go home with hospice. 3.  Hypothyroidism unspecified on Synthroid 4.  Depression on Lexapro Remeron and Ativan 5.  Hyperlipidemia.  Discontinue atorvastatin 6.  Abdominal pain and constipation.  Son also mentions pain after eating.  I will give Protonix just in case this is a ulcer or acid reflux.  Continue MiraLAX 7.  Chronic respiratory failure on oxygen 4 L  Her overall prognosis is poor.  Patient is not eating and drinking very well.  If she does not eat and drink her life  expectancy will be around a week.  If she eats and drinks, it will be longer than that.  Hospice to follow at the group home.  Patient is a DNR.  DISCHARGE CONDITIONS:   Guarded  CONSULTS OBTAINED:  Treatment Team:  Allyne Gee, MD  DRUG ALLERGIES:   Allergies  Allergen Reactions  . Ciprofloxacin Shortness Of Breath and Itching  . Levofloxacin Other (See Comments)    Reaction:  Unknown   . Morphine And Related Nausea And Vomiting  . Sulfa Antibiotics Hives and Itching  . Symbicort [Budesonide-Formoterol Fumarate] Other (See Comments)    Reaction:  Psychotic episode  . Advair Diskus [Fluticasone-Salmeterol] Anxiety  . Nickel Rash    DISCHARGE MEDICATIONS:   Allergies as of 10/04/2019      Reactions   Ciprofloxacin Shortness Of Breath, Itching   Levofloxacin Other (See Comments)   Reaction:  Unknown    Morphine And Related Nausea And Vomiting   Sulfa Antibiotics Hives, Itching   Symbicort [budesonide-formoterol Fumarate] Other (See Comments)   Reaction:  Psychotic episode   Advair Diskus [fluticasone-salmeterol] Anxiety   Nickel Rash      Medication List    STOP taking these medications   arformoterol 15 MCG/2ML Nebu Commonly known as: BROVANA   atorvastatin 20 MG tablet Commonly known as: LIPITOR   docusate sodium 100 MG capsule Commonly known as: COLACE   GNP Best Fiber Powd   loperamide 2 MG tablet Commonly known as:  IMODIUM A-D   meloxicam 7.5 MG tablet Commonly known as: MOBIC   Therems-M Tabs   Vitamin D (Cholecalciferol) 10 MCG (400 UNIT) Tabs     TAKE these medications   acetaminophen 325 MG tablet Commonly known as: TYLENOL Take 2 tablets (650 mg total) by mouth every 6 (six) hours as needed for mild pain (or Fever >/= 101).   albuterol 108 (90 Base) MCG/ACT inhaler Commonly known as: VENTOLIN HFA Inhale 2 puffs into the lungs every 6 (six) hours as needed for wheezing or shortness of breath. What changed: when to take this   Arnuity  Ellipta 100 MCG/ACT Aepb Generic drug: Fluticasone Furoate Inhale 1 puff into the lungs daily.   baclofen 10 MG tablet Commonly known as: LIORESAL Take 5 mg by mouth every 12 (twelve) hours as needed for muscle spasms.   busPIRone 5 MG tablet Commonly known as: BUSPAR Take 5 mg by mouth 2 (two) times daily.   calcium carbonate 500 MG chewable tablet Commonly known as: TUMS - dosed in mg elemental calcium Chew 1 tablet by mouth daily.   escitalopram 20 MG tablet Commonly known as: LEXAPRO Take 20 mg by mouth daily.   hydrOXYzine 25 MG capsule Commonly known as: VISTARIL TAKE ONE CAPSULE BY MOUTH EVERY 6 HOURS AS NEEDED   ipratropium-albuterol 0.5-2.5 (3) MG/3ML Soln Commonly known as: DUONEB Take 3 mLs by nebulization 2 (two) times daily as needed. What changed: Another medication with the same name was removed. Continue taking this medication, and follow the directions you see here.   levothyroxine 50 MCG tablet Commonly known as: SYNTHROID Take 50 mcg by mouth daily. On Monday, Wednesday, Friday   levothyroxine 75 MCG tablet Commonly known as: SYNTHROID Take 75 mcg by mouth daily. On Thursday, Saturday, Sundays   Linzess 72 MCG capsule Generic drug: linaclotide Take 72 mcg by mouth daily as needed.   LORazepam 0.5 MG tablet Commonly known as: ATIVAN Take 1 tablet (0.5 mg total) by mouth 2 (two) times daily.   magnesium hydroxide 400 MG/5ML suspension Commonly known as: MILK OF MAGNESIA Take 30 mLs by mouth daily as needed for mild constipation.   mirtazapine 30 MG tablet Commonly known as: REMERON Take 30 mg by mouth at bedtime.   morphine CONCENTRATE 10 MG/0.5ML Soln concentrated solution Place 0.25 mLs (5 mg total) under the tongue every 4 (four) hours as needed for severe pain or shortness of breath.   nystatin 100000 UNIT/ML suspension Commonly known as: MYCOSTATIN Take 5 mLs (500,000 Units total) by mouth 4 (four) times daily.   ondansetron 4 MG  disintegrating tablet Commonly known as: Zofran ODT Take 1 tablet (4 mg total) by mouth every 8 (eight) hours as needed for nausea or vomiting.   perphenazine 8 MG tablet Commonly known as: TRILAFON Take 8 mg by mouth at bedtime.   polyethylene glycol 17 g packet Commonly known as: MIRALAX / GLYCOLAX Take 17 g by mouth daily.   predniSONE 10 MG tablet Commonly known as: DELTASONE Take 1 tablet (10 mg total) by mouth daily. Start taking on: October 05, 2019   roflumilast 500 MCG Tabs tablet Commonly known as: DALIRESP Take 0.5 tablets (250 mcg total) by mouth daily.   Spiriva Respimat 2.5 MCG/ACT Aers Generic drug: Tiotropium Bromide Monohydrate Inhale 2 puffs into the lungs daily.   theophylline 100 MG 24 hr capsule Commonly known as: THEO-24 Take 1 capsule (100 mg total) by mouth at bedtime.   zolpidem 5 MG tablet Commonly known  as: AMBIEN Take 5 mg by mouth at bedtime as needed for sleep.        DISCHARGE INSTRUCTIONS:   Follow-up PMD 1 week Follow-up hospice at group home 1 day  If you experience worsening of your admission symptoms, develop shortness of breath, life threatening emergency, suicidal or homicidal thoughts you must seek medical attention immediately by calling 911 or calling your MD immediately  if symptoms less severe.  You Must read complete instructions/literature along with all the possible adverse reactions/side effects for all the Medicines you take and that have been prescribed to you. Take any new Medicines after you have completely understood and accept all the possible adverse reactions/side effects.   Please note  You were cared for by a hospitalist during your hospital stay. If you have any questions about your discharge medications or the care you received while you were in the hospital after you are discharged, you can call the unit and asked to speak with the hospitalist on call if the hospitalist that took care of you is not available.  Once you are discharged, your primary care physician will handle any further medical issues. Please note that NO REFILLS for any discharge medications will be authorized once you are discharged, as it is imperative that you return to your primary care physician (or establish a relationship with a primary care physician if you do not have one) for your aftercare needs so that they can reassess your need for medications and monitor your lab values.    Today   CHIEF COMPLAINT:   Chief Complaint  Patient presents with  . Abdominal Pain  . Shortness of Breath    HISTORY OF PRESENT ILLNESS:  Melissa George  is a 69 y.o. female came in with abdominal pain and shortness of breath   VITAL SIGNS:  Blood pressure 118/64, pulse 79, temperature 97.7 F (36.5 C), temperature source Oral, resp. rate 14, height 5\' 2"  (1.575 m), weight 50 kg, SpO2 99 %.  I/O:  No intake or output data in the 24 hours ending 10/04/19 0803  PHYSICAL EXAMINATION:  GENERAL:  69 y.o.-year-old patient lying in the bed with slight respiratory distress.  EYES: Pupils equal, round, reactive to light and accommodation. No scleral icterus. HEENT: Head atraumatic, normocephalic. Oropharynx and nasopharynx clear.  Ritta Slot is improving LUNGS: Decreased breath sounds bilaterally, no wheezing, rales,rhonchi or crepitation.  Positive use of accessory muscles of respiration.  CARDIOVASCULAR: S1, S2 normal. No murmurs, rubs, or gallops.  ABDOMEN: Soft, non-tender, non-distended. Bowel sounds present. No organomegaly or mass.  EXTREMITIES: No pedal edema.  PSYCHIATRIC: The patient is alert and oriented x 3.  SKIN: No obvious rash, lesion, or ulcer.   DATA REVIEW:   CBC Recent Labs  Lab 10/02/19 0457  WBC 14.1*  HGB 12.1  HCT 36.5  PLT 261    Chemistries  Recent Labs  Lab 10/04/19 0344  NA 142  K 4.8  CL 99  CO2 33*  GLUCOSE 128*  BUN 33*  CREATININE 0.71  CALCIUM 8.9    Microbiology Results  Results for  orders placed or performed during the hospital encounter of 09/25/19  SARS Coronavirus 2 by RT PCR (hospital order, performed in [facility] hospital lab) Nasopharyngeal Nasopharyngeal Swab     Status: None   Collection Time: 09/25/19  9:32 PM   Specimen: Nasopharyngeal Swab  Result Value Ref Range Status   SARS Coronavirus 2 NEGATIVE NEGATIVE Final    Comment: (NOTE) SARS-CoV-2 target nucleic acids  are NOT DETECTED.  The SARS-CoV-2 RNA is generally detectable in upper and lower respiratory specimens during the acute phase of infection. The lowest concentration of SARS-CoV-2 viral copies this assay can detect is 250 copies / mL. A negative result does not preclude SARS-CoV-2 infection and should not be used as the sole basis for treatment or other patient management decisions.  A negative result may occur with improper specimen collection / handling, submission of specimen other than nasopharyngeal swab, presence of viral mutation(s) within the areas targeted by this assay, and inadequate number of viral copies (<250 copies / mL). A negative result must be combined with clinical observations, patient history, and epidemiological information.  Fact Sheet for Patients:   StrictlyIdeas.no  Fact Sheet for Healthcare Providers: BankingDealers.co.za  This test is not yet approved or  cleared by the [place] FDA and has been authorized for detection and/or diagnosis of SARS-CoV-2 by FDA under an Emergency Use Authorization (EUA).  This EUA will remain in effect (meaning this test can be used) for the duration of the COVID-19 declaration under Section 564(b)(1) of the Act, 21 U.S.C. section 360bbb-3(b)(1), unless the authorization is terminated or revoked sooner.  Performed at [facility], [address] [place], [place] [address]      Management plans discussed with the patient, family (son on the phone) and they are in  agreement.  CODE STATUS:     Code Status Orders  (From admission, onward)         Start     Ordered   09/26/19 0137  Do not attempt resuscitation (DNR)  Continuous       Question Answer Comment  In the event of cardiac or respiratory ARREST Do not call a "code blue"   In the event of cardiac or respiratory ARREST Do not perform Intubation, CPR, defibrillation or ACLS   In the event of cardiac or respiratory ARREST Use medication by any route, position, wound care, and other measures to relive pain and suffering. May use oxygen, suction and manual treatment of airway obstruction as needed for comfort.      09/26/19 0136        Code Status History    Date Active Date Inactive Code Status Order ID Comments User Context   08/01/2017 1308 08/10/2017 1331 DNR [id number]  Bettey Costa, MD ED   11/06/2014 2158 11/15/2014 1127 Full Code [id number]  Gonzella Lex, MD Inpatient   11/01/2014 1841 11/05/2014 2135 Full Code [id number]  Gonzella Lex, MD Inpatient   10/29/2014 0225 11/01/2014 1841 Full Code [id number]  Hower, Aaron Mose, MD Inpatient   09/13/2014 0657 10/29/2014 0225 Full Code [id number]  Clapacs, Madie Reno, MD Inpatient   03/11/2014 1348 03/12/2014 0325 Full Code [id number]  Camprubi-Soms, Patty Sermons, PA-C ED   Advance Care Planning Activity      TOTAL TIME TAKING CARE OF THIS PATIENT: 32 minutes.    Loletha Grayer M.D on 10/04/2019 at 8:03 AM  Between 7am to 6pm - Pager - [phone]  After 6pm go to [email or web address] - password [email or web address]  [facility]  CC: Primary care physician; Remi Haggard, FNP

## 2019-10-04 NOTE — Discharge Instructions (Signed)
Chronic Obstructive Pulmonary Disease Chronic obstructive pulmonary disease (COPD) is a long-term (chronic) lung problem. When you have COPD, it is hard for air to get in and out of your lungs. Usually the condition gets worse over time, and your lungs will never return to normal. There are things you can do to keep yourself as healthy as possible.  Your doctor may treat your condition with: ? Medicines. ? Oxygen. ? Lung surgery.  Your doctor may also recommend: ? Rehabilitation. This includes steps to make your body work better. It may involve a team of specialists. ? Quitting smoking, if you smoke. ? Exercise and changes to your diet. ? Comfort measures (palliative care). Follow these instructions at home: Medicines  Take over-the-counter and prescription medicines only as told by your doctor.  Talk to your doctor before taking any cough or allergy medicines. You may need to avoid medicines that cause your lungs to be dry. Lifestyle  If you smoke, stop. Smoking makes the problem worse. If you need help quitting, ask your doctor.  Avoid being around things that make your breathing worse. This may include smoke, chemicals, and fumes.  Stay active, but remember to rest as well.  Learn and use tips on how to relax.  Make sure you get enough sleep. Most adults need at least 7 hours of sleep every night.  Eat healthy foods. Eat smaller meals more often. Rest before meals. Controlled breathing Learn and use tips on how to control your breathing as told by your doctor. Try:  Breathing in (inhaling) through your nose for 1 second. Then, pucker your lips and breath out (exhale) through your lips for 2 seconds.  Putting one hand on your belly (abdomen). Breathe in slowly through your nose for 1 second. Your hand on your belly should move out. Pucker your lips and breathe out slowly through your lips. Your hand on your belly should move in as you breathe out.  Controlled coughing Learn  and use controlled coughing to clear mucus from your lungs. Follow these steps: 1. Lean your head a little forward. 2. Breathe in deeply. 3. Try to hold your breath for 3 seconds. 4. Keep your mouth slightly open while coughing 2 times. 5. Spit any mucus out into a tissue. 6. Rest and do the steps again 1 or 2 times as needed. General instructions  Make sure you get all the shots (vaccines) that your doctor recommends. Ask your doctor about a flu shot and a pneumonia shot.  Use oxygen therapy and pulmonary rehabilitation if told by your doctor. If you need home oxygen therapy, ask your doctor if you should buy a tool to measure your oxygen level (oximeter).  Make a COPD action plan with your doctor. This helps you to know what to do if you feel worse than usual.  Manage any other conditions you have as told by your doctor.  Avoid going outside when it is very hot, cold, or humid.  Avoid people who have a sickness you can catch (contagious).  Keep all follow-up visits as told by your doctor. This is important. Contact a doctor if:  You cough up more mucus than usual.  There is a change in the color or thickness of the mucus.  It is harder to breathe than usual.  Your breathing is faster than usual.  You have trouble sleeping.  You need to use your medicines more often than usual.  You have trouble doing your normal activities such as getting dressed   or walking around the house. Get help right away if:  You have shortness of breath while resting.  You have shortness of breath that stops you from: ? Being able to talk. ? Doing normal activities.  Your chest hurts for longer than 5 minutes.  Your skin color is more blue than usual.  Your pulse oximeter shows that you have low oxygen for longer than 5 minutes.  You have a fever.  You feel too tired to breathe normally. Summary  Chronic obstructive pulmonary disease (COPD) is a long-term lung problem.  The way your  lungs work will never return to normal. Usually the condition gets worse over time. There are things you can do to keep yourself as healthy as possible.  Take over-the-counter and prescription medicines only as told by your doctor.  If you smoke, stop. Smoking makes the problem worse. This information is not intended to replace advice given to you by your health care provider. Make sure you discuss any questions you have with your health care provider. Document Revised: 03/10/2017 Document Reviewed: 05/02/2016 Elsevier Patient Education  2020 Elsevier Inc.  

## 2019-11-26 ENCOUNTER — Telehealth: Payer: Self-pay | Admitting: Physician Assistant

## 2019-11-26 NOTE — Telephone Encounter (Signed)
I connected by phone with Melissa George and/or patient's caregiver on 11/26/2019 at 8:20 PM to discuss the potential vaccination through our Homebound vaccination initiative.   Prevaccination Checklist for COVID-19 Vaccines  1.  Are you feeling sick today? no  2.  Have you ever received a dose of a COVID-19 vaccine?  no      If yes, which one? None   3.  Have you ever had an allergic reaction: (This would include a severe reaction [ e.g., anaphylaxis] that required treatment with epinephrine or EpiPen or that caused you to go to the hospital.  It would also include an allergic reaction that occurred within 4 hours that caused hives, swelling, or respiratory distress, including wheezing.) A.  A previous dose of COVID-19 vaccine. no  B.  A vaccine or injectable therapy that contains multiple components, one of which is a COVID-19 vaccine component, but it is not known which component elicited the immediate reaction. no  C.  Are you allergic to polyethylene glycol? no  D. Are you allergic to Polysorbate, which is found in some vaccines, film coated tablets and intravenous steroids?  no   4.  Have you ever had an allergic reaction to another vaccine (other than COVID-19 vaccine) or an injectable medication? (This would include a severe reaction [ e.g., anaphylaxis] that required treatment with epinephrine or EpiPen or that caused you to go to the hospital.  It would also include an allergic reaction that occurred within 4 hours that caused hives, swelling, or respiratory distress, including wheezing.)  no   5.  Have you ever had a severe allergic reaction (e.g., anaphylaxis) to something other than a component of the COVID-19 vaccine, or any vaccine or injectable medication?  This would include food, pet, venom, environmental, or oral medication allergies.  yes, cipro but anaphylaxis    6.  Have you received any vaccine in the last 14 days? no   7.  Have you ever had a positive test for COVID-19  or has a doctor ever told you that you had COVID-19?  no   8.  Have you received passive antibody therapy (monoclonal antibodies or convalescent serum) as a treatment for COVID-19? no   9.  Do you have a weakened immune system caused by something such as HIV infection or cancer or do you take immunosuppressive drugs or therapies?  no (used to take prednisone but no longer does)   10.  Do you have a bleeding disorder or are you taking a blood thinner? no   11.  Are you pregnant or breast-feeding? no   12.  Do you have dermal fillers? no   __________________   This patient is a 69 y.o. female that meets the FDA criteria to receive homebound vaccination. Patient or parent/caregiver understands they have the option to accept or refuse homebound vaccination.  Patient passed the pre-screening checklist and would like to proceed with homebound vaccination.  Based on questionnaire above, I recommend the patient be observed for 30 minutes.  There are no other household members/caregivers who are also interested in receiving the vaccine.    I will send the patient's information to our scheduling team who will reach out to schedule the patient and potential caregiver/family members for homebound vaccination.   Angelena Form 11/26/2019 8:20 PM

## 2019-12-04 ENCOUNTER — Ambulatory Visit: Payer: Medicare (Managed Care) | Attending: Critical Care Medicine

## 2019-12-04 DIAGNOSIS — Z23 Encounter for immunization: Secondary | ICD-10-CM

## 2019-12-04 NOTE — Progress Notes (Signed)
   Covid-19 Vaccination Clinic  Name:  Melissa George    MRN: [id number] DOB: 05/03/1950  12/04/2019  Melissa George was observed post Covid-19 immunization for 15 minutes without incident. She was provided with Vaccine Information Sheet and instruction to access the V-Safe system.   Melissa George was instructed to call 911 with any severe reactions post vaccine: Marland Kitchen Difficulty breathing  . Swelling of face and throat  . A fast heartbeat  . A bad rash all over body  . Dizziness and weakness   Immunizations Administered    Name Date Dose VIS Date Route   Pfizer COVID-19 Vaccine 12/04/2019 11:03 AM 0.3 mL 06/05/2018 Intramuscular   Manufacturer: [employer], [employer]   Lot: [id number]   [place]: [id number]

## 2019-12-25 ENCOUNTER — Other Ambulatory Visit: Payer: Self-pay

## 2019-12-25 ENCOUNTER — Ambulatory Visit: Payer: Medicare (Managed Care) | Attending: Internal Medicine

## 2019-12-25 DIAGNOSIS — Z23 Encounter for immunization: Secondary | ICD-10-CM

## 2019-12-25 NOTE — Progress Notes (Signed)
   Covid-19 Vaccination Clinic  Name:  Melissa George    MRN: [id number] DOB: [birth year]  12/25/2019  [name] was observed post Covid-19 immunization for 15 minutes without incident. She was provided with Vaccine Information Sheet and instruction to access the V-Safe system.   Ms. Welcome was instructed to call 911 with any severe reactions post vaccine: Marland Kitchen Difficulty breathing  . Swelling of face and throat  . A fast heartbeat  . A bad rash all over body  . Dizziness and weakness   Immunizations Administered    Name Date Dose VIS Date Route   Pfizer COVID-19 Vaccine 12/25/2019 10:47 AM 0.3 mL 06/05/2018 Intramuscular   Manufacturer: [employer], [employer]   Lot: [id number]   [place]: [id number]

## 2020-01-21 ENCOUNTER — Inpatient Hospital Stay
Admission: EM | Admit: 2020-01-21 | Discharge: 2020-01-30 | DRG: 521 | Disposition: A | Discharge status: Skilled Nursing Facility | Attending: Internal Medicine | Admitting: Internal Medicine

## 2020-01-21 ENCOUNTER — Emergency Department

## 2020-01-21 ENCOUNTER — Other Ambulatory Visit: Payer: Self-pay

## 2020-01-21 ENCOUNTER — Encounter: Payer: Self-pay | Admitting: Emergency Medicine

## 2020-01-21 ENCOUNTER — Inpatient Hospital Stay: Admitting: Anesthesiology

## 2020-01-21 ENCOUNTER — Inpatient Hospital Stay

## 2020-01-21 ENCOUNTER — Encounter
Admission: EM | Disposition: A | Discharge status: Skilled Nursing Facility | Payer: Self-pay | Source: Home / Self Care | Attending: Internal Medicine

## 2020-01-21 DIAGNOSIS — T402X5A Adverse effect of other opioids, initial encounter: Secondary | ICD-10-CM | POA: Diagnosis not present

## 2020-01-21 DIAGNOSIS — Z87891 Personal history of nicotine dependence: Secondary | ICD-10-CM

## 2020-01-21 DIAGNOSIS — D649 Anemia, unspecified: Secondary | ICD-10-CM | POA: Diagnosis not present

## 2020-01-21 DIAGNOSIS — R0600 Dyspnea, unspecified: Secondary | ICD-10-CM

## 2020-01-21 DIAGNOSIS — J9622 Acute and chronic respiratory failure with hypercapnia: Secondary | ICD-10-CM | POA: Diagnosis not present

## 2020-01-21 DIAGNOSIS — S72011A Unspecified intracapsular fracture of right femur, initial encounter for closed fracture: Principal | ICD-10-CM | POA: Diagnosis present

## 2020-01-21 DIAGNOSIS — F418 Other specified anxiety disorders: Secondary | ICD-10-CM

## 2020-01-21 DIAGNOSIS — R911 Solitary pulmonary nodule: Secondary | ICD-10-CM | POA: Diagnosis present

## 2020-01-21 DIAGNOSIS — I2510 Atherosclerotic heart disease of native coronary artery without angina pectoris: Secondary | ICD-10-CM | POA: Diagnosis present

## 2020-01-21 DIAGNOSIS — I272 Pulmonary hypertension, unspecified: Secondary | ICD-10-CM | POA: Diagnosis present

## 2020-01-21 DIAGNOSIS — Z79899 Other long term (current) drug therapy: Secondary | ICD-10-CM

## 2020-01-21 DIAGNOSIS — S72001A Fracture of unspecified part of neck of right femur, initial encounter for closed fracture: Secondary | ICD-10-CM

## 2020-01-21 DIAGNOSIS — Z681 Body mass index (BMI) 19 or less, adult: Secondary | ICD-10-CM | POA: Diagnosis not present

## 2020-01-21 DIAGNOSIS — M1611 Unilateral primary osteoarthritis, right hip: Secondary | ICD-10-CM | POA: Diagnosis present

## 2020-01-21 DIAGNOSIS — N179 Acute kidney failure, unspecified: Secondary | ICD-10-CM | POA: Diagnosis not present

## 2020-01-21 DIAGNOSIS — Y9301 Activity, walking, marching and hiking: Secondary | ICD-10-CM | POA: Diagnosis present

## 2020-01-21 DIAGNOSIS — Z888 Allergy status to other drugs, medicaments and biological substances status: Secondary | ICD-10-CM

## 2020-01-21 DIAGNOSIS — G928 Other toxic encephalopathy: Secondary | ICD-10-CM | POA: Diagnosis not present

## 2020-01-21 DIAGNOSIS — Z8249 Family history of ischemic heart disease and other diseases of the circulatory system: Secondary | ICD-10-CM

## 2020-01-21 DIAGNOSIS — Z20822 Contact with and (suspected) exposure to covid-19: Secondary | ICD-10-CM | POA: Diagnosis present

## 2020-01-21 DIAGNOSIS — Z9981 Dependence on supplemental oxygen: Secondary | ICD-10-CM | POA: Diagnosis not present

## 2020-01-21 DIAGNOSIS — Z808 Family history of malignant neoplasm of other organs or systems: Secondary | ICD-10-CM

## 2020-01-21 DIAGNOSIS — D62 Acute posthemorrhagic anemia: Secondary | ICD-10-CM | POA: Diagnosis not present

## 2020-01-21 DIAGNOSIS — E876 Hypokalemia: Secondary | ICD-10-CM | POA: Diagnosis present

## 2020-01-21 DIAGNOSIS — E039 Hypothyroidism, unspecified: Secondary | ICD-10-CM | POA: Diagnosis present

## 2020-01-21 DIAGNOSIS — K5900 Constipation, unspecified: Secondary | ICD-10-CM | POA: Diagnosis present

## 2020-01-21 DIAGNOSIS — D72829 Elevated white blood cell count, unspecified: Secondary | ICD-10-CM | POA: Diagnosis present

## 2020-01-21 DIAGNOSIS — Z8781 Personal history of (healed) traumatic fracture: Secondary | ICD-10-CM

## 2020-01-21 DIAGNOSIS — J441 Chronic obstructive pulmonary disease with (acute) exacerbation: Secondary | ICD-10-CM | POA: Diagnosis not present

## 2020-01-21 DIAGNOSIS — K219 Gastro-esophageal reflux disease without esophagitis: Secondary | ICD-10-CM | POA: Diagnosis present

## 2020-01-21 DIAGNOSIS — S72001D Fracture of unspecified part of neck of right femur, subsequent encounter for closed fracture with routine healing: Secondary | ICD-10-CM | POA: Diagnosis not present

## 2020-01-21 DIAGNOSIS — Z96642 Presence of left artificial hip joint: Secondary | ICD-10-CM | POA: Diagnosis present

## 2020-01-21 DIAGNOSIS — J9621 Acute and chronic respiratory failure with hypoxia: Secondary | ICD-10-CM | POA: Diagnosis not present

## 2020-01-21 DIAGNOSIS — R42 Dizziness and giddiness: Secondary | ICD-10-CM | POA: Diagnosis not present

## 2020-01-21 DIAGNOSIS — I252 Old myocardial infarction: Secondary | ICD-10-CM

## 2020-01-21 DIAGNOSIS — Z66 Do not resuscitate: Secondary | ICD-10-CM | POA: Diagnosis present

## 2020-01-21 DIAGNOSIS — Z885 Allergy status to narcotic agent status: Secondary | ICD-10-CM

## 2020-01-21 DIAGNOSIS — I10 Essential (primary) hypertension: Secondary | ICD-10-CM | POA: Diagnosis not present

## 2020-01-21 DIAGNOSIS — R319 Hematuria, unspecified: Secondary | ICD-10-CM | POA: Diagnosis present

## 2020-01-21 DIAGNOSIS — W010XXA Fall on same level from slipping, tripping and stumbling without subsequent striking against object, initial encounter: Secondary | ICD-10-CM | POA: Diagnosis present

## 2020-01-21 DIAGNOSIS — Z881 Allergy status to other antibiotic agents status: Secondary | ICD-10-CM

## 2020-01-21 DIAGNOSIS — Z79890 Hormone replacement therapy (postmenopausal): Secondary | ICD-10-CM

## 2020-01-21 DIAGNOSIS — R0602 Shortness of breath: Secondary | ICD-10-CM

## 2020-01-21 DIAGNOSIS — Y9289 Other specified places as the place of occurrence of the external cause: Secondary | ICD-10-CM | POA: Diagnosis not present

## 2020-01-21 DIAGNOSIS — J449 Chronic obstructive pulmonary disease, unspecified: Secondary | ICD-10-CM | POA: Diagnosis not present

## 2020-01-21 DIAGNOSIS — Z90710 Acquired absence of both cervix and uterus: Secondary | ICD-10-CM

## 2020-01-21 DIAGNOSIS — Z801 Family history of malignant neoplasm of trachea, bronchus and lung: Secondary | ICD-10-CM

## 2020-01-21 DIAGNOSIS — Z419 Encounter for procedure for purposes other than remedying health state, unspecified: Secondary | ICD-10-CM

## 2020-01-21 HISTORY — DX: Peripheral vascular disease, unspecified: I73.9

## 2020-01-21 HISTORY — PX: HIP ARTHROPLASTY: SHX981

## 2020-01-21 LAB — CBC WITH DIFFERENTIAL/PLATELET
Abs Immature Granulocytes: 0.2 10*3/uL — ABNORMAL HIGH (ref 0.00–0.07)
Basophils Absolute: 0.1 10*3/uL (ref 0.0–0.1)
Basophils Relative: 0 %
Eosinophils Absolute: 0.2 10*3/uL (ref 0.0–0.5)
Eosinophils Relative: 1 %
HCT: 34.6 % — ABNORMAL LOW (ref 36.0–46.0)
Hemoglobin: 11.1 g/dL — ABNORMAL LOW (ref 12.0–15.0)
Immature Granulocytes: 2 %
Lymphocytes Relative: 21 %
Lymphs Abs: 2.5 10*3/uL (ref 0.7–4.0)
MCH: 30.9 pg (ref 26.0–34.0)
MCHC: 32.1 g/dL (ref 30.0–36.0)
MCV: 96.4 fL (ref 80.0–100.0)
Monocytes Absolute: 1 10*3/uL (ref 0.1–1.0)
Monocytes Relative: 8 %
Neutro Abs: 8.5 10*3/uL — ABNORMAL HIGH (ref 1.7–7.7)
Neutrophils Relative %: 68 %
Platelets: 195 10*3/uL (ref 150–400)
RBC: 3.59 MIL/uL — ABNORMAL LOW (ref 3.87–5.11)
RDW: 12 % (ref 11.5–15.5)
WBC: 12.4 10*3/uL — ABNORMAL HIGH (ref 4.0–10.5)
nRBC: 0 % (ref 0.0–0.2)

## 2020-01-21 LAB — URINALYSIS, ROUTINE W REFLEX MICROSCOPIC
Bacteria, UA: NONE SEEN
Bilirubin Urine: NEGATIVE
Glucose, UA: NEGATIVE mg/dL
Ketones, ur: NEGATIVE mg/dL
Nitrite: NEGATIVE
Protein, ur: NEGATIVE mg/dL
Specific Gravity, Urine: 1.015 (ref 1.005–1.030)
Squamous Epithelial / HPF: NONE SEEN (ref 0–5)
pH: 5 (ref 5.0–8.0)

## 2020-01-21 LAB — BASIC METABOLIC PANEL
Anion gap: 7 (ref 5–15)
BUN: 21 mg/dL (ref 8–23)
CO2: 36 mmol/L — ABNORMAL HIGH (ref 22–32)
Calcium: 8.8 mg/dL — ABNORMAL LOW (ref 8.9–10.3)
Chloride: 98 mmol/L (ref 98–111)
Creatinine, Ser: 0.61 mg/dL (ref 0.44–1.00)
GFR, Estimated: >60 mL/min (ref >60)
Glucose, Bld: 145 mg/dL — ABNORMAL HIGH (ref 70–99)
Potassium: 3.2 mmol/L — ABNORMAL LOW (ref 3.5–5.1)
Sodium: 141 mmol/L (ref 135–145)

## 2020-01-21 LAB — TSH: TSH: 0.679 u[IU]/mL (ref 0.350–4.500)

## 2020-01-21 LAB — COMPREHENSIVE METABOLIC PANEL
ALT: 14 U/L (ref 0–44)
AST: 18 U/L (ref 15–41)
Albumin: 3.8 g/dL (ref 3.5–5.0)
Alkaline Phosphatase: 56 U/L (ref 38–126)
Anion gap: 9 (ref 5–15)
BUN: 19 mg/dL (ref 8–23)
CO2: 32 mmol/L (ref 22–32)
Calcium: 8.5 mg/dL — ABNORMAL LOW (ref 8.9–10.3)
Chloride: 99 mmol/L (ref 98–111)
Creatinine, Ser: 0.56 mg/dL (ref 0.44–1.00)
GFR, Estimated: >60 mL/min (ref >60)
Glucose, Bld: 143 mg/dL — ABNORMAL HIGH (ref 70–99)
Potassium: 3.3 mmol/L — ABNORMAL LOW (ref 3.5–5.1)
Sodium: 140 mmol/L (ref 135–145)
Total Bilirubin: 0.5 mg/dL (ref 0.3–1.2)
Total Protein: 6.4 g/dL — ABNORMAL LOW (ref 6.5–8.1)

## 2020-01-21 LAB — CBC
HCT: 33 % — ABNORMAL LOW (ref 36.0–46.0)
Hemoglobin: 10.7 g/dL — ABNORMAL LOW (ref 12.0–15.0)
MCH: 31.2 pg (ref 26.0–34.0)
MCHC: 32.4 g/dL (ref 30.0–36.0)
MCV: 96.2 fL (ref 80.0–100.0)
Platelets: 158 10*3/uL (ref 150–400)
RBC: 3.43 MIL/uL — ABNORMAL LOW (ref 3.87–5.11)
RDW: 12.2 % (ref 11.5–15.5)
WBC: 13.9 10*3/uL — ABNORMAL HIGH (ref 4.0–10.5)
nRBC: 0 % (ref 0.0–0.2)

## 2020-01-21 LAB — RESPIRATORY PANEL BY RT PCR (FLU A&B, COVID)
Influenza A by PCR: NEGATIVE
Influenza B by PCR: NEGATIVE
SARS Coronavirus 2 by RT PCR: NEGATIVE

## 2020-01-21 LAB — PROTIME-INR
INR: 0.9 (ref 0.8–1.2)
Prothrombin Time: 11.4 seconds (ref 11.4–15.2)

## 2020-01-21 LAB — MAGNESIUM: Magnesium: 1.7 mg/dL (ref 1.7–2.4)

## 2020-01-21 LAB — APTT: aPTT: 25 seconds (ref 24–36)

## 2020-01-21 SURGERY — HEMIARTHROPLASTY, HIP, DIRECT ANTERIOR APPROACH, FOR FRACTURE
Anesthesia: Spinal | Site: Hip | Laterality: Right

## 2020-01-21 MED ORDER — BUPIVACAINE-EPINEPHRINE (PF) 0.25% -1:200000 IJ SOLN
INTRAMUSCULAR | Status: DC | PRN
  Administered 2020-01-21: 20 mL via PERINEURAL

## 2020-01-21 MED ORDER — HYDROMORPHONE HCL 1 MG/ML IJ SOLN
0.5000 mg | INTRAMUSCULAR | Status: DC | PRN
  Administered 2020-01-21 – 2020-01-22 (×6): 1 mg via INTRAVENOUS
  Filled 2020-01-21 (×6): qty 1

## 2020-01-21 MED ORDER — LINACLOTIDE 72 MCG PO CAPS
72.0000 ug | ORAL_CAPSULE | Freq: Every day | ORAL | Status: DC
  Administered 2020-01-22 – 2020-01-30 (×9): 72 ug via ORAL
  Filled 2020-01-21 (×11): qty 1

## 2020-01-21 MED ORDER — ALBUTEROL SULFATE HFA 108 (90 BASE) MCG/ACT IN AERS
2.0000 | INHALATION_SPRAY | RESPIRATORY_TRACT | Status: DC | PRN
  Filled 2020-01-21 (×2): qty 6.7

## 2020-01-21 MED ORDER — POLYETHYLENE GLYCOL 3350 17 G PO PACK
17.0000 g | PACK | Freq: Every day | ORAL | Status: DC
  Administered 2020-01-22 – 2020-01-30 (×7): 17 g via ORAL
  Filled 2020-01-21 (×9): qty 1

## 2020-01-21 MED ORDER — CEFAZOLIN SODIUM-DEXTROSE 1-4 GM/50ML-% IV SOLN
INTRAVENOUS | Status: AC
  Administered 2020-01-21: 1 g via INTRAVENOUS
  Filled 2020-01-21: qty 50

## 2020-01-21 MED ORDER — DOCUSATE SODIUM 100 MG PO CAPS
100.0000 mg | ORAL_CAPSULE | Freq: Two times a day (BID) | ORAL | Status: DC
  Administered 2020-01-21 – 2020-01-30 (×17): 100 mg via ORAL
  Filled 2020-01-21 (×18): qty 1

## 2020-01-21 MED ORDER — ENOXAPARIN SODIUM 40 MG/0.4ML ~~LOC~~ SOLN: 40.0000 mg | SUBCUTANEOUS | Status: DC

## 2020-01-21 MED ORDER — LEVOTHYROXINE SODIUM 50 MCG PO TABS
50.0000 ug | ORAL_TABLET | ORAL | Status: DC
  Administered 2020-01-22 – 2020-01-29 (×4): 50 ug via ORAL
  Filled 2020-01-21 (×6): qty 1

## 2020-01-21 MED ORDER — EPHEDRINE SULFATE 50 MG/ML IJ SOLN
INTRAMUSCULAR | Status: DC | PRN
  Administered 2020-01-21 (×2): 10 mg via INTRAVENOUS

## 2020-01-21 MED ORDER — MORPHINE SULFATE (CONCENTRATE) 10 MG/0.5ML PO SOLN
5.0000 mg | ORAL | Status: DC | PRN
  Administered 2020-01-21 – 2020-01-30 (×23): 5 mg via SUBLINGUAL
  Filled 2020-01-21 (×23): qty 0.5

## 2020-01-21 MED ORDER — FLEET ENEMA 7-19 GM/118ML RE ENEM: 1.0000 | ENEMA | Freq: Once | RECTAL | Status: DC | PRN

## 2020-01-21 MED ORDER — METOCLOPRAMIDE HCL 10 MG PO TABS
5.0000 mg | ORAL_TABLET | Freq: Three times a day (TID) | ORAL | Status: DC | PRN
  Administered 2020-01-25 – 2020-01-28 (×3): 10 mg via ORAL
  Filled 2020-01-21 (×3): qty 1

## 2020-01-21 MED ORDER — TRAZODONE HCL 50 MG PO TABS
25.0000 mg | ORAL_TABLET | Freq: Every evening | ORAL | Status: DC | PRN
  Administered 2020-01-21 – 2020-01-29 (×2): 25 mg via ORAL
  Filled 2020-01-21 (×2): qty 1

## 2020-01-21 MED ORDER — BUPIVACAINE HCL (PF) 0.5 % IJ SOLN
INTRAMUSCULAR | Status: DC | PRN
  Administered 2020-01-21: 2.3 mL

## 2020-01-21 MED ORDER — BUDESONIDE 0.25 MG/2ML IN SUSP
0.2500 mg | Freq: Two times a day (BID) | RESPIRATORY_TRACT | Status: DC
  Administered 2020-01-21 – 2020-01-30 (×17): 0.25 mg via RESPIRATORY_TRACT
  Filled 2020-01-21 (×17): qty 2

## 2020-01-21 MED ORDER — MORPHINE SULFATE (PF) 4 MG/ML IV SOLN
4.0000 mg | INTRAVENOUS | Status: DC | PRN
  Administered 2020-01-21: 4 mg via INTRAVENOUS
  Filled 2020-01-21: qty 1

## 2020-01-21 MED ORDER — BACLOFEN 10 MG PO TABS: 5.0000 mg | ORAL_TABLET | Freq: Two times a day (BID) | ORAL | Status: DC | PRN

## 2020-01-21 MED ORDER — FENTANYL CITRATE (PF) 100 MCG/2ML IJ SOLN
INTRAMUSCULAR | Status: AC
  Filled 2020-01-21: qty 2

## 2020-01-21 MED ORDER — TRANEXAMIC ACID-NACL 1000-0.7 MG/100ML-% IV SOLN
INTRAVENOUS | Status: AC
  Filled 2020-01-21: qty 100

## 2020-01-21 MED ORDER — PANTOPRAZOLE SODIUM 40 MG PO TBEC
40.0000 mg | DELAYED_RELEASE_TABLET | Freq: Every day | ORAL | Status: DC
  Administered 2020-01-22 – 2020-01-30 (×9): 40 mg via ORAL
  Filled 2020-01-21 (×9): qty 1

## 2020-01-21 MED ORDER — ONDANSETRON HCL 4 MG/2ML IJ SOLN
4.0000 mg | Freq: Once | INTRAMUSCULAR | Status: AC
  Administered 2020-01-21: 4 mg via INTRAVENOUS
  Filled 2020-01-21: qty 2

## 2020-01-21 MED ORDER — CEFAZOLIN SODIUM-DEXTROSE 2-4 GM/100ML-% IV SOLN
2.0000 g | INTRAVENOUS | Status: DC
  Filled 2020-01-21: qty 100

## 2020-01-21 MED ORDER — THEOPHYLLINE ER 100 MG PO CP24
100.0000 mg | ORAL_CAPSULE | Freq: Every day | ORAL | Status: DC
  Administered 2020-01-21 – 2020-01-29 (×9): 100 mg via ORAL
  Filled 2020-01-21 (×10): qty 1

## 2020-01-21 MED ORDER — MIDAZOLAM HCL 2 MG/2ML IJ SOLN
INTRAMUSCULAR | Status: AC
  Filled 2020-01-21: qty 2

## 2020-01-21 MED ORDER — ACETAMINOPHEN 650 MG RE SUPP: 650.0000 mg | Freq: Four times a day (QID) | RECTAL | Status: DC | PRN

## 2020-01-21 MED ORDER — POTASSIUM CHLORIDE 20 MEQ PO PACK
40.0000 meq | PACK | Freq: Once | ORAL | Status: DC
  Filled 2020-01-21: qty 2

## 2020-01-21 MED ORDER — MIRTAZAPINE 15 MG PO TABS
30.0000 mg | ORAL_TABLET | Freq: Every day | ORAL | Status: DC
  Administered 2020-01-21 – 2020-01-29 (×9): 30 mg via ORAL
  Filled 2020-01-21 (×9): qty 2

## 2020-01-21 MED ORDER — PROPOFOL 500 MG/50ML IV EMUL
INTRAVENOUS | Status: DC | PRN
  Administered 2020-01-21: 75 ug/kg/min via INTRAVENOUS

## 2020-01-21 MED ORDER — ONDANSETRON 4 MG PO TBDP
4.0000 mg | ORAL_TABLET | Freq: Three times a day (TID) | ORAL | Status: DC | PRN
  Administered 2020-01-22 – 2020-01-26 (×3): 4 mg via ORAL
  Filled 2020-01-21 (×5): qty 1

## 2020-01-21 MED ORDER — CALCIUM CARBONATE ANTACID 500 MG PO CHEW
1.0000 | CHEWABLE_TABLET | Freq: Every day | ORAL | Status: DC
  Administered 2020-01-22 – 2020-01-30 (×8): 200 mg via ORAL
  Filled 2020-01-21 (×9): qty 1

## 2020-01-21 MED ORDER — TIOTROPIUM BROMIDE MONOHYDRATE 18 MCG IN CAPS
18.0000 ug | ORAL_CAPSULE | Freq: Every day | RESPIRATORY_TRACT | Status: DC
  Administered 2020-01-22 – 2020-01-30 (×7): 18 ug via RESPIRATORY_TRACT
  Filled 2020-01-21 (×2): qty 5

## 2020-01-21 MED ORDER — MAGNESIUM HYDROXIDE 400 MG/5ML PO SUSP: 30.0000 mL | Freq: Every day | ORAL | Status: DC | PRN

## 2020-01-21 MED ORDER — PREDNISONE 10 MG PO TABS: 10.0000 mg | ORAL_TABLET | Freq: Every day | ORAL | Status: DC

## 2020-01-21 MED ORDER — ONDANSETRON HCL 4 MG/2ML IJ SOLN: 4.0000 mg | Freq: Once | INTRAMUSCULAR | Status: DC | PRN

## 2020-01-21 MED ORDER — ACETAMINOPHEN 325 MG PO TABS
650.0000 mg | ORAL_TABLET | Freq: Four times a day (QID) | ORAL | Status: DC | PRN
  Administered 2020-01-21 – 2020-01-29 (×9): 650 mg via ORAL
  Filled 2020-01-21 (×8): qty 2

## 2020-01-21 MED ORDER — PERPHENAZINE 4 MG PO TABS
8.0000 mg | ORAL_TABLET | Freq: Every day | ORAL | Status: DC
  Administered 2020-01-21 – 2020-01-29 (×9): 8 mg via ORAL
  Filled 2020-01-21 (×10): qty 2

## 2020-01-21 MED ORDER — LACTATED RINGERS IV SOLN: INTRAVENOUS | Status: DC

## 2020-01-21 MED ORDER — METOCLOPRAMIDE HCL 5 MG/ML IJ SOLN: 5.0000 mg | Freq: Three times a day (TID) | INTRAMUSCULAR | Status: DC | PRN

## 2020-01-21 MED ORDER — CEFAZOLIN SODIUM-DEXTROSE 2-3 GM-%(50ML) IV SOLR
INTRAVENOUS | Status: DC | PRN
  Administered 2020-01-21: 2 g via INTRAVENOUS

## 2020-01-21 MED ORDER — HYDROXYZINE HCL 25 MG PO TABS
25.0000 mg | ORAL_TABLET | Freq: Four times a day (QID) | ORAL | Status: DC | PRN
  Administered 2020-01-25 – 2020-01-27 (×2): 25 mg via ORAL
  Filled 2020-01-21 (×6): qty 1

## 2020-01-21 MED ORDER — SODIUM CHLORIDE 0.9 % IV SOLN
INTRAVENOUS | Status: DC | PRN
  Administered 2020-01-21: 50 ug/min via INTRAVENOUS

## 2020-01-21 MED ORDER — ONDANSETRON HCL 4 MG/2ML IJ SOLN
4.0000 mg | INTRAMUSCULAR | Status: DC | PRN
  Administered 2020-01-21 – 2020-01-27 (×8): 4 mg via INTRAVENOUS
  Filled 2020-01-21 (×8): qty 2

## 2020-01-21 MED ORDER — FENTANYL CITRATE (PF) 100 MCG/2ML IJ SOLN
INTRAMUSCULAR | Status: DC | PRN
Start: 2020-01-21 — End: 2020-01-21
  Administered 2020-01-21: 50 ug via INTRAVENOUS

## 2020-01-21 MED ORDER — CEFAZOLIN SODIUM-DEXTROSE 1-4 GM/50ML-% IV SOLN
1.0000 g | Freq: Four times a day (QID) | INTRAVENOUS | Status: AC
  Administered 2020-01-22 (×2): 1 g via INTRAVENOUS
  Filled 2020-01-21 (×5): qty 50

## 2020-01-21 MED ORDER — CHLORHEXIDINE GLUCONATE 0.12 % MT SOLN: 15.0000 mL | Freq: Once | OROMUCOSAL | Status: AC

## 2020-01-21 MED ORDER — LEVOTHYROXINE SODIUM 50 MCG PO TABS
75.0000 ug | ORAL_TABLET | ORAL | Status: DC
  Administered 2020-01-23 – 2020-01-30 (×5): 75 ug via ORAL
  Filled 2020-01-21 (×3): qty 1

## 2020-01-21 MED ORDER — ENOXAPARIN SODIUM 40 MG/0.4ML ~~LOC~~ SOLN
40.0000 mg | SUBCUTANEOUS | Status: DC
  Administered 2020-01-22: 40 mg via SUBCUTANEOUS
  Filled 2020-01-21: qty 0.4

## 2020-01-21 MED ORDER — ZOLPIDEM TARTRATE 5 MG PO TABS
5.0000 mg | ORAL_TABLET | Freq: Every evening | ORAL | Status: DC | PRN
  Administered 2020-01-22 – 2020-01-29 (×3): 5 mg via ORAL
  Filled 2020-01-21 (×3): qty 1

## 2020-01-21 MED ORDER — SODIUM CHLORIDE 0.9 % IV SOLN: Freq: Once | INTRAVENOUS | Status: DC

## 2020-01-21 MED ORDER — POTASSIUM CHLORIDE IN NACL 20-0.9 MEQ/L-% IV SOLN
INTRAVENOUS | Status: DC
  Filled 2020-01-21 (×5): qty 1000

## 2020-01-21 MED ORDER — MIDAZOLAM HCL 5 MG/5ML IJ SOLN
INTRAMUSCULAR | Status: DC | PRN
  Administered 2020-01-21: 1 mg via INTRAVENOUS

## 2020-01-21 MED ORDER — TRANEXAMIC ACID-NACL 1000-0.7 MG/100ML-% IV SOLN
1000.0000 mg | INTRAVENOUS | Status: AC
  Administered 2020-01-21: 1000 mg via INTRAVENOUS
  Filled 2020-01-21: qty 100

## 2020-01-21 MED ORDER — ONDANSETRON HCL 4 MG/2ML IJ SOLN
4.0000 mg | Freq: Four times a day (QID) | INTRAMUSCULAR | Status: DC | PRN
  Administered 2020-01-21: 4 mg via INTRAVENOUS
  Filled 2020-01-21 (×2): qty 2

## 2020-01-21 MED ORDER — BUSPIRONE HCL 10 MG PO TABS
5.0000 mg | ORAL_TABLET | Freq: Two times a day (BID) | ORAL | Status: DC
  Administered 2020-01-21 – 2020-01-30 (×18): 5 mg via ORAL
  Filled 2020-01-21 (×18): qty 1

## 2020-01-21 MED ORDER — LINACLOTIDE 72 MCG PO CAPS: 72.0000 ug | ORAL_CAPSULE | Freq: Every day | ORAL | Status: DC

## 2020-01-21 MED ORDER — CHLORHEXIDINE GLUCONATE 0.12 % MT SOLN
OROMUCOSAL | Status: AC
  Administered 2020-01-21: 15 mL via OROMUCOSAL
  Filled 2020-01-21: qty 15

## 2020-01-21 MED ORDER — ORAL CARE MOUTH RINSE: 15.0000 mL | Freq: Once | OROMUCOSAL | Status: AC

## 2020-01-21 MED ORDER — CEFAZOLIN SODIUM-DEXTROSE 2-4 GM/100ML-% IV SOLN
INTRAVENOUS | Status: AC
  Filled 2020-01-21: qty 100

## 2020-01-21 MED ORDER — PROPOFOL 500 MG/50ML IV EMUL
INTRAVENOUS | Status: AC
  Filled 2020-01-21: qty 50

## 2020-01-21 MED ORDER — ESCITALOPRAM OXALATE 10 MG PO TABS
20.0000 mg | ORAL_TABLET | Freq: Every day | ORAL | Status: DC
  Administered 2020-01-23 – 2020-01-30 (×8): 20 mg via ORAL
  Filled 2020-01-21 (×9): qty 2

## 2020-01-21 MED ORDER — SODIUM CHLORIDE 0.9 % IV SOLN: INTRAVENOUS | Status: DC

## 2020-01-21 MED ORDER — FENTANYL CITRATE (PF) 100 MCG/2ML IJ SOLN: 25.0000 ug | INTRAMUSCULAR | Status: DC | PRN

## 2020-01-21 MED ORDER — LORAZEPAM 0.5 MG PO TABS
0.5000 mg | ORAL_TABLET | Freq: Two times a day (BID) | ORAL | Status: DC
  Administered 2020-01-21 – 2020-01-23 (×4): 0.5 mg via ORAL
  Filled 2020-01-21 (×4): qty 1

## 2020-01-21 MED ORDER — LEVOTHYROXINE SODIUM 50 MCG PO TABS: 75.0000 ug | ORAL_TABLET | ORAL | Status: DC

## 2020-01-21 SURGICAL SUPPLY — 47 items
BLADE SAGITTAL WIDE XTHICK NO (BLADE) ×2 IMPLANT
BRUSH SCRUB EZ  4% CHG (MISCELLANEOUS) ×2
BRUSH SCRUB EZ 4% CHG (MISCELLANEOUS) ×2 IMPLANT
CHLORAPREP W/TINT 26 (MISCELLANEOUS) ×2 IMPLANT
COVER WAND RF STERILE (DRAPES) ×2 IMPLANT
DRAPE 3/4 80X56 (DRAPES) ×2 IMPLANT
DRAPE C-ARM 42X72 X-RAY (DRAPES) ×2 IMPLANT
DRAPE STERI IOBAN 125X83 (DRAPES) IMPLANT
DRSG AQUACEL AG ADV 3.5X10 (GAUZE/BANDAGES/DRESSINGS) IMPLANT
DRSG AQUACEL AG ADV 3.5X14 (GAUZE/BANDAGES/DRESSINGS) IMPLANT
ELECT BLADE 6.5 EXT (BLADE) ×2 IMPLANT
ELECT REM PT RETURN 9FT ADLT (ELECTROSURGICAL) ×2
ELECTRODE REM PT RTRN 9FT ADLT (ELECTROSURGICAL) ×1 IMPLANT
FEM HEAD COCR 28MM 3 (Orthopedic Implant) ×2 IMPLANT
FEMORAL HEAD COCR 28MM 3 (Orthopedic Implant) ×1 IMPLANT
GAUZE XEROFORM 1X8 LF (GAUZE/BANDAGES/DRESSINGS) IMPLANT
GLOVE INDICATOR 8.0 STRL GRN (GLOVE) ×2 IMPLANT
GLOVE SURG ORTHO 8.0 STRL STRW (GLOVE) ×4 IMPLANT
GOWN STRL REUS W/ TWL LRG LVL3 (GOWN DISPOSABLE) ×1 IMPLANT
GOWN STRL REUS W/ TWL XL LVL3 (GOWN DISPOSABLE) ×1 IMPLANT
GOWN STRL REUS W/TWL LRG LVL3 (GOWN DISPOSABLE) ×1
GOWN STRL REUS W/TWL XL LVL3 (GOWN DISPOSABLE) ×1
HEAD BIPOLAR 45MM (Hips) ×2 IMPLANT
HOOD PEEL AWAY FLYTE STAYCOOL (MISCELLANEOUS) ×6 IMPLANT
IRRIGATION SURGIPHOR STRL (IV SOLUTION) IMPLANT
IV NS 1000ML (IV SOLUTION) ×1
IV NS 1000ML BAXH (IV SOLUTION) ×1 IMPLANT
KIT PATIENT CARE HANA TABLE (KITS) ×2 IMPLANT
KIT TURNOVER CYSTO (KITS) ×2 IMPLANT
MAT ABSORB  FLUID 56X50 GRAY (MISCELLANEOUS) ×1
MAT ABSORB FLUID 56X50 GRAY (MISCELLANEOUS) ×1 IMPLANT
NDL SAFETY ECLIPSE 18X1.5 (NEEDLE) ×2 IMPLANT
NEEDLE HYPO 18GX1.5 SHARP (NEEDLE) ×2
NEEDLE HYPO 22GX1.5 SAFETY (NEEDLE) ×2 IMPLANT
NEEDLE SPNL 20GX3.5 QUINCKE YW (NEEDLE) ×2 IMPLANT
PACK HIP PROSTHESIS (MISCELLANEOUS) ×2 IMPLANT
PADDING CAST BLEND 4X4 NS (MISCELLANEOUS) ×4 IMPLANT
PILLOW ABDUCTION MEDIUM (MISCELLANEOUS) ×2 IMPLANT
PULSAVAC PLUS IRRIG FAN TIP (DISPOSABLE) ×2
STAPLER SKIN PROX 35W (STAPLE) ×2 IMPLANT
STEM POLAR STD SZ4 COLLAR (Stent) ×2 IMPLANT
SUT BONE WAX W31G (SUTURE) ×2 IMPLANT
SUT DVC 2 QUILL PDO  T11 36X36 (SUTURE) ×1
SUT DVC 2 QUILL PDO T11 36X36 (SUTURE) ×1 IMPLANT
SUT VIC AB 2-0 CT1 18 (SUTURE) ×2 IMPLANT
SYR 20ML LL LF (SYRINGE) ×2 IMPLANT
TIP FAN IRRIG PULSAVAC PLUS (DISPOSABLE) ×1 IMPLANT

## 2020-01-21 NOTE — Anesthesia Preprocedure Evaluation (Signed)
Anesthesia Evaluation  Patient identified by MRN, date of birth, ID band Patient awake    Reviewed: Allergy & Precautions, NPO status , Patient's Chart, lab work & pertinent test results  History of Anesthesia Complications Negative for: history of anesthetic complications  Airway Mallampati: II  TM Distance: >3 FB Neck ROM: Full    Dental  (+) Poor Dentition, Missing   Pulmonary neg sleep apnea, COPD (on home O2 4L),  COPD inhaler and oxygen dependent, former smoker,    breath sounds clear to auscultation- rhonchi (-) wheezing      Cardiovascular + CAD, + Past MI and + Peripheral Vascular Disease  (-) Cardiac Stents and (-) CABG  Rhythm:Regular Rate:Normal - Systolic murmurs and - Diastolic murmurs    Neuro/Psych neg Seizures PSYCHIATRIC DISORDERS Anxiety Depression Schizophrenia negative neurological ROS     GI/Hepatic negative GI ROS, Neg liver ROS,   Endo/Other  neg diabetesHypothyroidism   Renal/GU Renal InsufficiencyRenal disease     Musculoskeletal negative musculoskeletal ROS (+)   Abdominal (+) - obese,   Peds  Hematology negative hematology ROS (+)   Anesthesia Other Findings Past Medical History: No date: Anxiety No date: Anxiety No date: COPD (chronic obstructive pulmonary disease) (HCC) No date: PAD (peripheral artery disease) (HCC) 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   Reproductive/Obstetrics                             Lab Results  Component Value Date   WBC 13.9 (H) 01/21/2020   HGB 10.7 (L) 01/21/2020   HCT 33.0 (L) 01/21/2020   MCV 96.2 01/21/2020   PLT 158 01/21/2020    Anesthesia Physical Anesthesia Plan  ASA: III  Anesthesia Plan: Spinal   Post-op Pain Management:    Induction:   PONV Risk Score and Plan: 2 and Propofol infusion  Airway Management Planned: Natural Airway  Additional Equipment:   Intra-op Plan:   Post-operative Plan:    Informed Consent: I have reviewed the patients History and Physical, chart, labs and discussed the procedure including the risks, benefits and alternatives for the proposed anesthesia with the patient or authorized representative who has indicated his/her understanding and acceptance.   Patient has DNR.  Discussed DNR with patient, Discussed DNR with power of attorney and Suspend DNR.   Dental advisory given and Consent reviewed with POA (Pt is oriented but has legal guardian, phone consent obtained from legal guardian)  Plan Discussed with: CRNA and Anesthesiologist  Anesthesia Plan Comments:         Anesthesia Quick Evaluation

## 2020-01-21 NOTE — Anesthesia Procedure Notes (Signed)
Spinal  Patient location during procedure: OR Start time: 01/21/2020 3:36 PM End time: 01/21/2020 3:41 PM Staffing Performed: resident/CRNA  Resident/CRNA: Nelda Marseille, CRNA Preanesthetic Checklist Completed: patient identified, IV checked, site marked, risks and benefits discussed, surgical consent, monitors and equipment checked, pre-op evaluation and timeout performed Spinal Block Patient position: sitting Prep: Betadine Patient monitoring: heart rate, continuous pulse ox, blood pressure and cardiac monitor Approach: midline Location: L3-4 Injection technique: single-shot Needle Needle type: Whitacre and Introducer  Needle gauge: 25 G Needle length: 9 cm Additional Notes Negative paresthesia. Negative blood return. Positive free-flowing CSF. Expiration date of kit checked and confirmed. Patient tolerated procedure well, without complications.

## 2020-01-21 NOTE — ED Triage Notes (Signed)
Pt arrived via [facility] from [place] home where pt had mechanical fall this AM, landing onto the right hip. Shortening noted to the rt leg. Pt on 4L chronically (COPD.) Pt denies LOC and hitting head.

## 2020-01-21 NOTE — Progress Notes (Signed)
15 minute call to floor. 

## 2020-01-21 NOTE — Anesthesia Procedure Notes (Signed)
Date/Time: 01/21/2020 3:41 PM Performed by: Nelda Marseille, CRNA Pre-anesthesia Checklist: Patient identified, Emergency Drugs available, Suction available, Patient being monitored and Timeout performed Oxygen Delivery Method: Simple face mask
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
## 2020-01-21 NOTE — Progress Notes (Signed)
°   01/21/20 1746  Assess: MEWS Score  Temp 98 F (36.7 C)  BP 132/72  Pulse Rate (!) 109  Resp (!) 21  Level of Consciousness Alert  SpO2 92 %  O2 Device Nasal Cannula  Patient Activity (if Appropriate) In bed  O2 Flow Rate (L/min) 4 L/min  Assess: MEWS Score  MEWS Temp 0  MEWS Systolic 0  MEWS Pulse 1  MEWS RR 1  MEWS LOC 0  MEWS Score 2  MEWS Score Color Yellow  Assess: if the MEWS score is Yellow or Red  Were vital signs taken at a resting state? Yes  Focused Assessment No change from prior assessment  Early Detection of Sepsis Score *See Row Information* Low  MEWS guidelines implemented *See Row Information* No, other (Comment) (pt getting more frequent VS d/t postop protocol)  Treat  Pain Scale 0-10  Pain Score 0  Notify: Charge Nurse/RN  Name of Charge Nurse/RN Notified Teresa, RN  Date Charge Nurse/RN Notified 01/21/20  Time Charge Nurse/RN Notified 1812  Document  Patient Outcome Other (Comment) (stable)  Progress note created (see row info) Yes

## 2020-01-21 NOTE — ED Notes (Signed)
Son updated to the best of this RN ability

## 2020-01-21 NOTE — Transfer of Care (Signed)
Immediate Anesthesia Transfer of Care Note  Patient: Melissa George  Procedure(s) Performed: ARTHROPLASTY BIPOLAR HIP (HEMIARTHROPLASTY) (Right Hip)  Patient Location: PACU  Anesthesia Type:Spinal  Level of Consciousness: awake, alert  and oriented  Airway & Oxygen Therapy: Patient Spontanous Breathing and Patient connected to face mask oxygen  Post-op Assessment: Report given to RN and Post -op Vital signs reviewed and stable  Post vital signs: Reviewed and stable  Last Vitals:  Vitals Value Taken Time  BP 115/88 01/21/20 1717  Temp 36.4 C 01/21/20 1703  Pulse 111 01/21/20 1717  Resp 12 01/21/20 1717  SpO2 100 % 01/21/20 1717  Vitals shown include unvalidated device data.  Last Pain:  Vitals:   01/21/20 1706  TempSrc:   PainSc: 0-No pain         Complications: No complications documented.

## 2020-01-21 NOTE — Progress Notes (Signed)
[facility] [place] NAME: Melissa George    MR#:  [id number]  DATE OF BIRTH:  Jun 14, 1950  SUBJECTIVE:  patient came in from group home after she had a mechanical fall. Complains of right hip pain. She also complains of nausea. wants some ice chips feels very thirsty. No family in the ER.  REVIEW OF SYSTEMS:   Review of Systems  Constitutional: Negative for chills, fever and weight loss.  HENT: Negative for ear discharge, ear pain and nosebleeds.   Eyes: Negative for blurred vision, pain and discharge.  Respiratory: Negative for sputum production, shortness of breath, wheezing and stridor.   Cardiovascular: Negative for chest pain, palpitations, orthopnea and PND.  Gastrointestinal: Positive for nausea. Negative for abdominal pain, diarrhea and vomiting.  Genitourinary: Negative for frequency and urgency.  Musculoskeletal: Positive for falls and joint pain. Negative for back pain.  Neurological: Positive for weakness. Negative for sensory change, speech change and focal weakness.  Psychiatric/Behavioral: Negative for depression and hallucinations. The patient is not nervous/anxious.    Tolerating Diet:npo Tolerating PT: pending sx  DRUG ALLERGIES:   Allergies  Allergen Reactions  . Ciprofloxacin Shortness Of Breath and Itching  . Levofloxacin Other (See Comments)    Reaction:  Unknown   . Morphine And Related Nausea And Vomiting  . Sulfa Antibiotics Hives and Itching  . Symbicort [Budesonide-Formoterol Fumarate] Other (See Comments)    Reaction:  Psychotic episode  . Advair Diskus [Fluticasone-Salmeterol] Anxiety  . Nickel Rash    VITALS:  Blood pressure (!) 143/67, pulse (!) 103, temperature 98.7 F (37.1 C), temperature source Oral, resp. rate 14, SpO2 92 %.  PHYSICAL EXAMINATION:   Physical Exam limited GENERAL:  69 y.o.-year-old patient lying in the bed with no acute distress. Thin cachectic   HEENT: Head atraumatic,  normocephalic. Oropharynx and nasopharynx clear. Dry oral mucosa LUNGS: distant breath sounds bilaterally, no wheezing, rales, rhonchi. No use of accessory muscles of respiration.  CARDIOVASCULAR: S1, S2 normal. No murmurs, rubs, or gallops.  ABDOMEN: Soft, nontender, nondistended. Bowel sounds present. No organomegaly or mass.  EXTREMITIES: No cyanosis, clubbing or edema b/l.   Decreased auto and right leg NEUROLOGIC: grossly nonfocal PSYCHIATRIC:  patient is alert and awake SKIN: No obvious rash, lesion, or ulcer.   LABORATORY PANEL:  CBC Recent Labs  Lab 01/21/20 0525  WBC 13.9*  HGB 10.7*  HCT 33.0*  PLT 158    Chemistries  Recent Labs  Lab 01/21/20 0247 01/21/20 0247 01/21/20 0525  NA 140   < > 141  K 3.3*   < > 3.2*  CL 99   < > 98  CO2 32   < > 36*  GLUCOSE 143*   < > 145*  BUN 19   < > 21  CREATININE 0.56   < > 0.61  CALCIUM 8.5*   < > 8.8*  MG  --   --  1.7  AST 18  --   --   ALT 14  --   --   ALKPHOS 56  --   --   BILITOT 0.5  --   --    < > = values in this interval not displayed.   Cardiac Enzymes No results for input(s): TROPONINI in the last 168 hours. RADIOLOGY:  DG Chest Port 1 View  Result Date: 01/21/2020 CLINICAL DATA:  Preoperative examination, pulmonary nodule EXAM: PORTABLE CHEST 1 VIEW COMPARISON:  CT and chest radiograph 09/25/2019 FINDINGS: The left  upper lobe pulmonary nodule noted on CT examination demonstrates interval growth in the interval since prior examination now measuring roughly 2.6 cm in diameter. Densely calcified nodule is again seen within the right apex, unchanged. The lungs are otherwise clear. No pneumothorax or pleural effusion. Cardiac size within normal limits. The pulmonary vascularity is normal. No acute bone abnormality. IMPRESSION: Interval enlargement of left upper lobe pulmonary nodule, now measuring 2.6 cm. Electronically Signed   By: Fidela Salisbury MD   On: 01/21/2020 04:00   DG Hip Unilat With Pelvis 2-3 Views  Right  Result Date: 01/21/2020 CLINICAL DATA:  Right hip pain following fall EXAM: DG HIP (WITH OR WITHOUT PELVIS) 2-3V RIGHT COMPARISON:  None. FINDINGS: Single view radiograph of the pelvis and two view radiograph of the right hip demonstrate a acute, subcapital right femoral neck fracture. The femoral head is still seated within the right acetabulum but demonstrates marked external rotation. The femoral shaft demonstrates superior migration, external rotation, and marked varus angulation. There is at least mild right hip degenerative arthritis with joint space narrowing noted. 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 IMPRESSION: Acute, angulated subcapital femoral neck fracture. Electronically Signed   By: Fidela Salisbury MD   On: 01/21/2020 03:58   ASSESSMENT AND PLAN:  Melissa George  is a 69 y.o. Caucasian female with a known history of COPD, coronary artery disease and anxiety, who presented to the emergency room with acute onset of right hip pain after having mechanical fall at her group home.  She stated that she lost balance and fell on the right side.   #Right hip fracture secondary to mechanical fall -patient seen in consultation with Dr. Harlow Mares. Plans to take for surgery. -Preop workup was done by Dr. Sidney Ace -IV fluids -IV PRN pain meds -PT OT to start after surgery  #Hypothyroidism  -continue Synthroid  #Chronic COPD on chronic home oxygen -continue PRN albuterol MDI-continues theophylline 24, dailiresp and Spiriva  #Gerd -PPI  #Depression anxiety continue BuSpar and Lexapro  DVT prophylaxis -SCD for now-postop management by ortho   Dispo: The patient is from: Group home  Anticipated d/c is to: SNF  Anticipated d/c date is: > 3 days  Patient currently is not medically stable to d/c. Procedures: Family communication : left message for legal guardian [name] Consults : orthopedic Dr.  Harlow Mares CODE STATUS: DNR prior to arrival DVT Prophylaxis : SCD pending surgery  Status is: Inpatient  Remains inpatient appropriate because:Ongoing active pain requiring inpatient pain management patient admitted with right hip fracture. Surgery pending. She will need PT OT consult after surgery and TOC for discharge planning   TOTAL TIME TAKING CARE OF THIS PATIENT: *30* minutes.  >50% time spent on counselling and coordination of care  Note: This dictation was prepared with Dragon dictation along with smaller phrase technology. Any transcriptional errors that result from this process are unintentional.  Fritzi Mandes M.D    [facility]   CC: Primary care physician; Remi Haggard, FNPPatient ID: Melissa George, female   DOB: Mar 23, 1951, 69 y.o.   MRN: [id number]

## 2020-01-21 NOTE — Progress Notes (Signed)
[facility] hospital liaison note:  Patient is currently followed by [facility] hospice services at [facility] with a hospice diagnosis of emphysema. She is a DNR code with out of facility DNR in place in the home.  Patient was sent to the [facility] ED following fall. Hospice was not notified prior to transport. In the ED right hip xray revealed an Acute angulated subcapital femoral neck fracture. This is a related admission per hospice physician Dr. Gilford Rile.  Orthopedics has been consulted and per chart note review surgical repair is planned for today pending signed consents.  Writer spoke via telephone to [place], who stated they were aware of the plan. DSS must sign consents. Message left earlier for DSS guardian Nigel Sloop regarding same. Writer relayed this information and contact numbers to staff RN [place] and [employer].  Patient seen at bedside. She was alert and quietly interactive. She did report pain and knew she could have pain medications in 30 minutes. She also reported nausea. Staff RN Caryl Comes aware and PRN antisemitic to be given.  Emotional support given to patient. Will continue to follow and assist with discharge needs.  Patient has received 2 doses of IV Dilaudid, one dose of IV morphine 4 mg, one dose of liquid morphine 5 mg, IV zofran 4 mg x 2 doses since midnight.   VS: 98.7 oral, 143/67, 103, 14  IV/PRN medications: 0.9% NaCl w/KCL 20 mEq @ 75 ml/hr, Dilaudid 0.5-1 mg IV  Q 4 hrs PRN for pain, Zofran IV 4 mg Q 4 hrs PRN, Morphine concentrate 5 mg SL Q 4 hrs PRN pain/dyspnea  I/O +100  Imaging: CLINICAL DATA:  Right hip pain following fall  EXAM: DG HIP (WITH OR WITHOUT PELVIS) 2-3V RIGHT  COMPARISON:  None.  FINDINGS: Single view radiograph of the pelvis and two view radiograph of the right hip demonstrate a acute, subcapital right femoral neck fracture. The femoral head is still seated  within the right acetabulum but demonstrates marked external rotation. The femoral shaft demonstrates superior migration, external rotation, and marked varus angulation. There is at least mild right hip degenerative arthritis with joint space narrowing noted. 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  IMPRESSION: Acute, angulated subcapital femoral neck fracture. Electronically Signed By: Fidela Salisbury MD On: 01/21/2020 03:58   CLINICAL DATA:  Preoperative examination, pulmonary nodule  EXAM: PORTABLE CHEST 1 VIEW  COMPARISON:  CT and chest radiograph 09/25/2019  FINDINGS: The left upper lobe pulmonary nodule noted on CT examination demonstrates interval growth in the interval since prior examination now measuring roughly 2.6 cm in diameter. Densely calcified nodule is again seen within the right apex, unchanged. The lungs are otherwise clear. No pneumothorax or pleural effusion. Cardiac size within normal limits. The pulmonary vascularity is normal. No acute bone abnormality.  IMPRESSION: Interval enlargement of left upper lobe pulmonary nodule, now measuring 2.6 cm. Electronically Signed By: Fidela Salisbury MD On: 01/21/2020 04:00  Hospital problem list:  Right hip fracture secondary to mechanical fall -patient seen in consultation with Dr. Harlow Mares. Plans to take for surgery. -Preop workup was done by Dr. Sidney Ace -IV fluids -IV PRN pain meds -PT OT to start after surgery  #Hypothyroidism  -continue Synthroid  #Chronic COPD on chronic home oxygen -continue PRN albuterol MDI-continues theophylline 24, dailiresp and Spiriva  #Gerd -PPI  #Depression anxiety continue BuSpar and Lexapro  DVT prophylaxis -SCD for now-postop management by ortho  Dispo: The patient is from:Group home Anticipated d/c is to:SNF Anticipated d/c date is: > 3 days Patient currently is not  medically stable to d/c.  Discharge plan: ongoing Spoke with DSS supervisor/guardian Goals of care DNR IDG: Updated Transfer summary and current medication list placed on hospital shadow chart. Will continue to follow and assist with discharge planning. Please call with any hospice related questions or needs. Thank you.  Flo Shanks BSN, RN, [place] [phone]

## 2020-01-21 NOTE — H&P (Addendum)
[place]   PATIENT NAME: Melissa George    MR#:  [id number]  DATE OF BIRTH:  October 18, 1950  DATE OF ADMISSION:  01/21/2020  PRIMARY CARE PHYSICIAN: Remi Haggard, FNP   REQUESTING/REFERRING PHYSICIAN: Hinda Kehr, MD CHIEF COMPLAINT:   Chief Complaint  Patient presents with  . Fall    HISTORY OF PRESENT ILLNESS:  Melissa George  is a 69 y.o. Caucasian female with a known history of COPD, coronary artery disease and anxiety, who presented to the emergency room with acute onset of right hip pain after having mechanical fall at her group home.  She stated that she lost balance and fell on the right side.  She does not admit to any headache or dizziness or blurred vision, presyncope or syncope, paresthesias or focal muscle weakness.  No chest pain or palpitations.  She denied any fever or chills.  No dysuria, oliguria or hematuria or flank pain.  No bleeding diathesis.  Upon presentation to the emergency room, blood pressure was 155/84 with a heart rate of 108 and respiratory to 22.  Labs revealed mild hypokalemia 3.3 with otherwise unremarkable CMP.  CBC showed leukocytosis of 12.4 with neutrophilia as well as anemia.  COVID-19 PCR and influenza antigens came back negative.  CBC showed 21-50 RBCs with hyaline casts.  Chest x-ray showed interval enlargement of left upper lobe pulmonary nodule now measuring 2.6 cm Right hip x-ray reviewed acute angulated subcapital femoral neck fracture. EKG showed normal sinus tachycardia with rate of 110 with right axis deviation and flattened T waves laterally.  The patient was given 4 mg of IV morphine sulfate and 1 g of IV Zofran and hydration with IV normal saline.  She will be admitted to a medical-surgical bed for further evaluation and management PAST MEDICAL HISTORY:   Past Medical History:  Diagnosis Date  . Anxiety   . Anxiety   . COPD (chronic obstructive pulmonary disease) ([place])   . 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 ABDOMINAL HYSTERECTOMY    . APPENDECTOMY    . CARDIAC CATHETERIZATION N/A 10/31/2014   Procedure: Left Heart Cath and Coronary Angiography;  Surgeon: Isaias Cowman, MD;  Location: [place] [facility];  Service: Cardiovascular;  Laterality: N/A;  . CESAREAN SECTION    . HAND SURGERY      SOCIAL HISTORY:   Social History   Tobacco Use  . Smoking status: Former Smoker    Packs/day: 0.25    Years: 15.00    Pack years: 3.75    Types: Cigarettes    Quit date: 09/13/2014    Years since quitting: 5.3  . Smokeless tobacco: Never Used  Substance Use Topics  . Alcohol use: No    FAMILY HISTORY:   Family History  Problem Relation Age of Onset  . CAD Mother   . Thyroid cancer Other   . Lung cancer Other     DRUG ALLERGIES:   Allergies  Allergen Reactions  . Ciprofloxacin Shortness Of Breath and Itching  . Levofloxacin Other (See Comments)    Reaction:  Unknown   . Morphine And Related Nausea And Vomiting  . Sulfa Antibiotics Hives and Itching  . Symbicort [Budesonide-Formoterol Fumarate] Other (See Comments)    Reaction:  Psychotic episode  . Advair Diskus [Fluticasone-Salmeterol] Anxiety  . Nickel Rash    REVIEW OF  SYSTEMS:   ROS As per history of present illness. All pertinent systems were reviewed above. Constitutional, HEENT, cardiovascular, respiratory, GI, GU, musculoskeletal, neuro, psychiatric, endocrine, integumentary and hematologic systems were reviewed and are otherwise negative/unremarkable except for positive findings mentioned above in the HPI.   MEDICATIONS AT HOME:   Prior to Admission medications   Medication Sig Start Date End Date Taking? Authorizing Provider  acetaminophen (TYLENOL) 325 MG tablet Take 2 tablets (650 mg total) by mouth every 6 (six) hours as needed for mild pain (or Fever >/= 101).  08/10/17   Loletha Grayer, MD  albuterol (PROVENTIL HFA;VENTOLIN HFA) 108 (90 BASE) MCG/ACT inhaler Inhale 2 puffs into the lungs every 6 (six) hours as needed for wheezing or shortness of breath. Patient taking differently: Inhale 2 puffs into the lungs every 4 (four) hours as needed for wheezing or shortness of breath.  11/11/14   Hildred Priest, MD  baclofen (LIORESAL) 10 MG tablet Take 5 mg by mouth every 12 (twelve) hours as needed for muscle spasms.    [provider]  busPIRone (BUSPAR) 5 MG tablet Take 5 mg by mouth 2 (two) times daily.    [provider]  calcium carbonate (TUMS - DOSED IN MG ELEMENTAL CALCIUM) 500 MG chewable tablet Chew 1 tablet by mouth daily.    [provider]  escitalopram (LEXAPRO) 20 MG tablet Take 20 mg by mouth daily. 02/19/18   [provider]  Fluticasone Furoate (ARNUITY ELLIPTA) 100 MCG/ACT AEPB Inhale 1 puff into the lungs daily.    [provider]  hydrOXYzine (VISTARIL) 25 MG capsule TAKE ONE CAPSULE BY MOUTH EVERY 6 HOURS AS NEEDED 02/09/18   [provider]  ipratropium-albuterol (DUONEB) 0.5-2.5 (3) MG/3ML SOLN Take 3 mLs by nebulization 2 (two) times daily as needed.    [provider]  levothyroxine (SYNTHROID, LEVOTHROID) 50 MCG tablet Take 50 mcg by mouth daily. On Monday, Wednesday, Friday    [provider]  levothyroxine (SYNTHROID, LEVOTHROID) 75 MCG tablet Take 75 mcg by mouth daily. On Thursday, Saturday, Sundays 02/19/18   [provider]  linaclotide Rolan Lipa) 72 MCG capsule Take 72 mcg by mouth daily as needed.    [provider]  LORazepam (ATIVAN) 0.5 MG tablet Take 1 tablet (0.5 mg total) by mouth 2 (two) times daily. 10/04/19   Loletha Grayer, MD  magnesium hydroxide (MILK OF MAGNESIA) 400 MG/5ML suspension Take 30 mLs by mouth daily as needed for mild constipation.    [provider]  mirtazapine (REMERON) 30 MG tablet Take 30 mg  by mouth at bedtime. 02/19/18   [provider]  Morphine Sulfate (MORPHINE CONCENTRATE) 10 MG/0.5ML SOLN concentrated solution Place 0.25 mLs (5 mg total) under the tongue every 4 (four) hours as needed for severe pain or shortness of breath. 10/04/19   Loletha Grayer, MD  nystatin (MYCOSTATIN) 100000 UNIT/ML suspension Take 5 mLs (500,000 Units total) by mouth 4 (four) times daily. 10/04/19   Loletha Grayer, MD  ondansetron (ZOFRAN ODT) 4 MG disintegrating tablet Take 1 tablet (4 mg total) by mouth every 8 (eight) hours as needed for nausea or vomiting. 07/06/19   Earleen Newport, MD  pantoprazole (PROTONIX) 40 MG tablet Take 1 tablet (40 mg total) by mouth daily. 10/04/19   Loletha Grayer, MD  perphenazine (TRILAFON) 8 MG tablet Take 8 mg by mouth at bedtime. 02/19/18   [provider]  polyethylene glycol (MIRALAX / GLYCOLAX) 17 g packet Take 17 g by mouth  daily. 10/04/19   Loletha Grayer, MD  predniSONE (DELTASONE) 10 MG tablet Take 1 tablet (10 mg total) by mouth daily. 10/05/19   Loletha Grayer, MD  roflumilast (DALIRESP) 500 MCG TABS tablet Take 0.5 tablets (250 mcg total) by mouth daily. 10/04/19   Loletha Grayer, MD  theophylline (THEO-24) 100 MG 24 hr capsule Take 1 capsule (100 mg total) by mouth at bedtime. 10/04/19   Loletha Grayer, MD  Tiotropium Bromide Monohydrate (SPIRIVA RESPIMAT) 2.5 MCG/ACT AERS Inhale 2 puffs into the lungs daily.    [provider]  zolpidem (AMBIEN) 5 MG tablet Take 5 mg by mouth at bedtime as needed for sleep.    [provider]      VITAL SIGNS:  Blood pressure (!) 142/83, pulse (!) 104, temperature 98.7 F (37.1 C), temperature source Oral, resp. rate (!) 23, SpO2 92 %.  PHYSICAL EXAMINATION:  Physical Exam  GENERAL:  69 y.o.-year-old Caucasian female patient lying in the bed with no acute distress.  EYES: Pupils equal, round, reactive to light and accommodation. No scleral icterus. Extraocular muscles  intact.  HEENT: Head atraumatic, normocephalic. Oropharynx and nasopharynx clear.  NECK:  Supple, no jugular venous distention. No thyroid enlargement, no tenderness.  LUNGS: Normal breath sounds bilaterally, no wheezing, rales,rhonchi or crepitation. No use of accessory muscles of respiration.  CARDIOVASCULAR: Regular rate and rhythm, S1, S2 normal. No murmurs, rubs, or gallops.  ABDOMEN: Soft, nondistended, nontender. Bowel sounds present. No organomegaly or mass.  EXTREMITIES: No pedal edema, cyanosis, or clubbing. Musculoskeletal: Right lateral hip tenderness with right lateral leg rotation. NEUROLOGIC: Cranial nerves II through XII are intact. Muscle strength 5/5 in all extremities. Sensation intact. Gait not checked.  PSYCHIATRIC: The patient is alert and oriented x 3.  Normal affect and good eye contact. SKIN: No obvious rash, lesion, or ulcer.   LABORATORY PANEL:   CBC Recent Labs  Lab 01/21/20 0247  WBC 12.4*  HGB 11.1*  HCT 34.6*  PLT 195   ------------------------------------------------------------------------------------------------------------------  Chemistries  Recent Labs  Lab 01/21/20 0247  NA 140  K 3.3*  CL 99  CO2 32  GLUCOSE 143*  BUN 19  CREATININE 0.56  CALCIUM 8.5*  AST 18  ALT 14  ALKPHOS 56  BILITOT 0.5   ------------------------------------------------------------------------------------------------------------------  Cardiac Enzymes No results for input(s): TROPONINI in the last 168 hours. ------------------------------------------------------------------------------------------------------------------  RADIOLOGY:  DG Chest Port 1 View  Result Date: 01/21/2020 CLINICAL DATA:  Preoperative examination, pulmonary nodule EXAM: PORTABLE CHEST 1 VIEW COMPARISON:  CT and chest radiograph 09/25/2019 FINDINGS: The left upper lobe pulmonary nodule noted on CT examination demonstrates interval growth in the interval since prior examination now  measuring roughly 2.6 cm in diameter. Densely calcified nodule is again seen within the right apex, unchanged. The lungs are otherwise clear. No pneumothorax or pleural effusion. Cardiac size within normal limits. The pulmonary vascularity is normal. No acute bone abnormality. IMPRESSION: Interval enlargement of left upper lobe pulmonary nodule, now measuring 2.6 cm. Electronically Signed   By: Fidela Salisbury MD   On: 01/21/2020 04:00   DG Hip Unilat With Pelvis 2-3 Views Right  Result Date: 01/21/2020 CLINICAL DATA:  Right hip pain following fall EXAM: DG HIP (WITH OR WITHOUT PELVIS) 2-3V RIGHT COMPARISON:  None. FINDINGS: Single view radiograph of the pelvis and two view radiograph of the right hip demonstrate a acute, subcapital right femoral neck fracture. The femoral head is still seated within the right acetabulum but demonstrates marked external rotation. The femoral  shaft demonstrates superior migration, external rotation, and marked varus angulation. There is at least mild right hip degenerative arthritis with joint space narrowing noted. 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 IMPRESSION: Acute, angulated subcapital femoral neck fracture. Electronically Signed   By: Fidela Salisbury MD   On: 01/21/2020 03:58      IMPRESSION AND PLAN:   1.  Right hip fracture secondary to mechanical fall. -The patient will be admitted to a medical-surgical bed. -Pain management will be provided. -She will be kept n.p.o. -Orthopedic consultation will be obtained. -I notified Dr. Kurtis Bushman about the patient. -She has no history of CHF, CVA, diabetes mellitus on insulin or renal failure with creatinine more than 2.  She is above average risk for perioperative cardiovascular events per the revised cardiac risk index just based on a history of previous MI but currently not having any chest pain or any symptomatology to delay surgery for today and she has no active  pulmonary issues.  This was discussed with Dr. Harlow Mares.  2.  Hypothyroidism. -We will continue Synthroid and check TSH level.  3.  COPD. -She has no current exacerbation. -We will continue her as needed albuterol MDI. -We will continue her Theo-24 and Spiriva. -We will resume her Daliresp for pulmonary hypertension.  4.  GERD. -PPI therapy will be resumed.  5.  Depression and anxiety. -We will continue BuSpar and Lexapro.  6.  DVT prophylaxis. -SCDs. -Medical prophylaxis currently delayed for postoperative.  All the records are reviewed and case discussed with ED provider. The plan of care was discussed in details with the patient (and family). I answered all questions. The patient agreed to proceed with the above mentioned plan. Further management will depend upon hospital course.   CODE STATUS: Full code  Status is: Inpatient  Remains inpatient appropriate because:Ongoing active pain requiring inpatient pain management, Ongoing diagnostic testing needed not appropriate for outpatient work up, Unsafe d/c plan, IV treatments appropriate due to intensity of illness or inability to take PO and Inpatient level of care appropriate due to severity of illness   Dispo: The patient is from: Group home              Anticipated d/c is to: SNF              Anticipated d/c date is: > 3 days              Patient currently is not medically stable to d/c.   TOTAL TIME TAKING CARE OF THIS PATIENT: 55 minutes.    Christel Mormon M.D on 01/21/2020 at 4:36 AM  [facility]   From 7 PM-7 AM, contact night-coverage [email or web address]  CC: Primary care physician; Remi Haggard, FNP

## 2020-01-21 NOTE — ED Notes (Signed)
Pt resting at this time, call bell within reach, stretcher locked in lowest position

## 2020-01-21 NOTE — Consult Note (Signed)
[place] for Electrolyte Monitoring and Replacement   Recent Labs: Potassium (mmol/L)  Date Value  01/21/2020 3.2 (L)  03/02/2014 3.3 (L)   Magnesium (mg/dL)  Date Value  01/21/2020 1.7  01/23/2014 2.2   Calcium (mg/dL)  Date Value  01/21/2020 8.8 (L)   Calcium, Total (mg/dL)  Date Value  03/02/2014 8.8   Albumin (g/dL)  Date Value  01/21/2020 3.8  03/02/2014 3.6   Phosphorus (mg/dL)  Date Value  10/07/2014 3.4   Sodium (mmol/L)  Date Value  01/21/2020 141  03/02/2014 141    Assessment: Patient is a 69 y/o F with medical history including COPD, CAD, anxiety who presented to the ED 10/12 with R hip pain after mechanical fall. Patient found to have R hip fracture. Ortho consulted and plan is for R hip hemiarthroplasty 10/12. Patient is currently NPO for surgery.   Diet: NPO MIVF: NS at 75 mL/hr  Goal of Therapy:  Electrolytes within normal limits  Plan:  --Labs today notable for K 3.2. Provider has ordered PO KCl 40 mEq x 1. Will further add 20 mEq/L to MIVF (36 K+ mEq/day) --Magnesium at lower limit of normal. Vitals with ST. Continue to monitor. --Re-check electrolytes with AM labs  Benita Gutter 01/21/2020 7:41 AM

## 2020-01-21 NOTE — Op Note (Signed)
01/21/2020  4:54 PM  PATIENT:  Melissa George   MRN: [id number]  PRE-OPERATIVE DIAGNOSIS:  Displaced Subcapital fracture right hip   POST-OPERATIVE DIAGNOSIS: Same  Procedure: Right Hip Anterior Hip Hemiarthroplasty   Surgeon: Elyn Aquas. Harlow Mares, MD   Assist: Carlynn Spry, PA-C  Anesthesia: Spinal   EBL: 300 mL   Specimens: None   Drains: None   Components used: A size 4 Polarstem [employer], a 45 mm -3 mm bipolar head    Description of the procedure in detail: After informed consent was obtained and the appropriate extremity marked in the pre-operative holding area, the patient was taken to the operating room and placed in the supine position on the fracture table. All pressure points were well padded and bilateral lower extremities were place in traction spars. The hip was prepped and draped in standard sterile fashion. A spinal anesthetic had been delivered by the anesthesia team. The skin and subcutaneous tissues were injected with a mixture of Marcaine with epinephrine for post-operative pain. A longitudinal incision approximately 10 cm in length was carried out from the anterior superior iliac spine to the greater trochanter. The tensor fascia was divided and blunt dissection was taken down to the level of the joint capsule. The lateral circumflex vessels were cauterized. Deep retractors were placed and a portion of the anterior capsule was excised. Using fluoroscopy the neck cut was planned and carried out with a sagittal saw. The head was passed from the field with use of a corkscrew and hip skid. Deep retractors were placed along the acetabulum and bony and soft tissue debris was removed.   Attention was then turned to the proximal femur. The leg was placed in extension and external rotation. The canal was opened and sequentially broached to a size 4. The trial components were placed and the hip relocated. The components were found to be in good position using fluoroscopy.  The hip was dislocated and the trial components removed. The final components were impacted in to position and the hip relocated. The final components were again check with fluoroscopy and found to be in good position. Hemostasis was achieved with electrocautery. The deep capsule was injected with Marcaine and epinephrine. The wound was irrigated with bacitracin laced normal saline and the tensor fascia closed with #2 Quill suture. The subcutaneous tissues were closed with 2-0 vicryl and staples for the skin. A sterile dressing was applied and an abduction pillow. Patient tolerated the procedure well and there were no apparent complication. Patient was taken to the recovery room in good condition.    Elyn Aquas. Harlow Mares, MD  01/21/2020 4:54 PM

## 2020-01-21 NOTE — ED Notes (Signed)
This RN spoke to [place] RN  To call back with questions, informed pt will be on the way

## 2020-01-21 NOTE — ED Provider Notes (Addendum)
[facility] Emergency Department Provider Note  ____________________________________________   First MD Initiated Contact with Patient 01/21/20 0241     (approximate)  I have reviewed the triage vital signs and the nursing notes.   HISTORY  Chief Complaint Fall    HPI Melissa George is a 69 y.o. female with history most notable for COPD on 4 L of oxygen at baseline.  She presents by EMS  for evaluation after a fall.  She says that she tripped when she was walking and landed on her right hip.  She had acute onset of severe pain in the right hip.  The people at her facility were able to help her back into a wheelchair but the severe pain continued.  Upon arrival from EMS her right leg is shortened and externally rotated and she has severe pain with any amount of movement.  Rest makes it a little bit better.  She received fentanyl 100 mcg IV in route to the hospital by EMS.  She denies loss of consciousness.  She denies hitting her head.  She has no headache or neck pain.  She denies chest pain.  She reports chronic shortness of breath but reports that it is no worse than normal.  She has had no recent dysuria.  The onset of the symptoms was acute and her pain is severe.  She has no numbness nor tingling in the right leg.  She reports that she has had a prior left hip fracture and surgery.        Past Medical History:  Diagnosis Date  . Anxiety   . Anxiety   . COPD (chronic obstructive pulmonary disease) ([place])   . 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 S/P right hip fracture 01/21/2020  . DNR (do not resuscitate)   . Constipation   . Shortness of breath   . Goals of care, counseling/discussion   . Palliative care by specialist   . Lung nodule   . Hypothyroidism   . Depression   . Hyperlipidemia   . COPD exacerbation ([place]) 09/26/2019  . Depression, major, recurrent, severe with psychosis ([place])  08/01/2017  . Coronary artery disease 11/06/2014  . NSTEMI (non-ST elevated myocardial infarction) ([place]) 10/29/2014  . Protein-calorie malnutrition, severe ([place]) 10/29/2014  . 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 COPD (chronic obstructive pulmonary disease) ([place]) 09/12/2014  . GAD (generalized anxiety disorder)     Past Surgical History:  Procedure Laterality Date  . ABDOMINAL HYSTERECTOMY    . APPENDECTOMY    . CARDIAC CATHETERIZATION N/A 10/31/2014   Procedure: Left Heart Cath and Coronary Angiography;  Surgeon: Isaias Cowman, MD;  Location: [facility];  Service: Cardiovascular;  Laterality: N/A;  . CESAREAN SECTION    . HAND SURGERY      Prior to Admission medications   Medication Sig Start Date End Date Taking? Authorizing Provider  acetaminophen (TYLENOL) 325 MG tablet Take 2 tablets (650 mg total) by mouth every 6 (six) hours as needed for mild pain (or Fever >/= 101). 08/10/17   Loletha Grayer, MD  albuterol (PROVENTIL HFA;VENTOLIN HFA) 108 (90 BASE) MCG/ACT inhaler Inhale 2 puffs into the lungs every 6 (six) hours as needed for wheezing or shortness of breath. Patient taking differently: Inhale 2 puffs into the lungs every 4 (four) hours as needed for  wheezing or shortness of breath.  11/11/14   Hildred Priest, MD  baclofen (LIORESAL) 10 MG tablet Take 5 mg by mouth every 12 (twelve) hours as needed for muscle spasms.    [provider]  busPIRone (BUSPAR) 5 MG tablet Take 5 mg by mouth 2 (two) times daily.    [provider]  calcium carbonate (TUMS - DOSED IN MG ELEMENTAL CALCIUM) 500 MG chewable tablet Chew 1 tablet by mouth daily.    [provider]  escitalopram (LEXAPRO) 20 MG tablet Take 20 mg by mouth daily. 02/19/18   [provider]  Fluticasone Furoate (ARNUITY ELLIPTA) 100 MCG/ACT AEPB Inhale 1 puff  into the lungs daily.    [provider]  hydrOXYzine (VISTARIL) 25 MG capsule TAKE ONE CAPSULE BY MOUTH EVERY 6 HOURS AS NEEDED 02/09/18   [provider]  ipratropium-albuterol (DUONEB) 0.5-2.5 (3) MG/3ML SOLN Take 3 mLs by nebulization 2 (two) times daily as needed.    [provider]  levothyroxine (SYNTHROID, LEVOTHROID) 50 MCG tablet Take 50 mcg by mouth daily. On Monday, Wednesday, Friday    [provider]  levothyroxine (SYNTHROID, LEVOTHROID) 75 MCG tablet Take 75 mcg by mouth daily. On Thursday, Saturday, Sundays 02/19/18   [provider]  linaclotide Rolan Lipa) 72 MCG capsule Take 72 mcg by mouth daily as needed.    [provider]  LORazepam (ATIVAN) 0.5 MG tablet Take 1 tablet (0.5 mg total) by mouth 2 (two) times daily. 10/04/19   Loletha Grayer, MD  magnesium hydroxide (MILK OF MAGNESIA) 400 MG/5ML suspension Take 30 mLs by mouth daily as needed for mild constipation.    [provider]  mirtazapine (REMERON) 30 MG tablet Take 30 mg by mouth at bedtime. 02/19/18   [provider]  Morphine Sulfate (MORPHINE CONCENTRATE) 10 MG/0.5ML SOLN concentrated solution Place 0.25 mLs (5 mg total) under the tongue every 4 (four) hours as needed for severe pain or shortness of breath. 10/04/19   Loletha Grayer, MD  nystatin (MYCOSTATIN) 100000 UNIT/ML suspension Take 5 mLs (500,000 Units total) by mouth 4 (four) times daily. 10/04/19   Loletha Grayer, MD  ondansetron (ZOFRAN ODT) 4 MG disintegrating tablet Take 1 tablet (4 mg total) by mouth every 8 (eight) hours as needed for nausea or vomiting. 07/06/19   Earleen Newport, MD  pantoprazole (PROTONIX) 40 MG tablet Take 1 tablet (40 mg total) by mouth daily. 10/04/19   Loletha Grayer, MD  perphenazine (TRILAFON) 8 MG tablet Take 8 mg by mouth at bedtime. 02/19/18   [provider]  polyethylene glycol (MIRALAX / GLYCOLAX) 17 g packet Take 17 g by mouth daily.  10/04/19   Loletha Grayer, MD  predniSONE (DELTASONE) 10 MG tablet Take 1 tablet (10 mg total) by mouth daily. 10/05/19   Loletha Grayer, MD  roflumilast (DALIRESP) 500 MCG TABS tablet Take 0.5 tablets (250 mcg total) by mouth daily. 10/04/19   Loletha Grayer, MD  theophylline (THEO-24) 100 MG 24 hr capsule Take 1 capsule (100 mg total) by mouth at bedtime. 10/04/19   Loletha Grayer, MD  Tiotropium Bromide Monohydrate (SPIRIVA RESPIMAT) 2.5 MCG/ACT AERS Inhale 2 puffs into the lungs daily.    [provider]  zolpidem (AMBIEN) 5 MG tablet Take 5 mg by mouth at bedtime as needed for sleep.    [provider]    Allergies Ciprofloxacin, Levofloxacin, Morphine and related, Sulfa antibiotics, Symbicort [budesonide-formoterol fumarate], Advair diskus [fluticasone-salmeterol], and Nickel  Family History  Problem Relation  Age of Onset  . CAD Mother   . Thyroid cancer Other   . Lung cancer Other     Social History Social History   Tobacco Use  . Smoking status: Former Smoker    Packs/day: 0.25    Years: 15.00    Pack years: 3.75    Types: Cigarettes    Quit date: 09/13/2014    Years since quitting: 5.3  . Smokeless tobacco: Never Used  Substance Use Topics  . Alcohol use: No  . Drug use: No    Review of Systems Constitutional: No fever/chills Eyes: No visual changes. ENT: No sore throat. Cardiovascular: Denies chest pain.  Denies syncope. Respiratory: Denies shortness of breath. Gastrointestinal: No abdominal pain.  No nausea, no vomiting.  No diarrhea.  No constipation. Genitourinary: Negative for dysuria. Musculoskeletal: Acute onset of pain in the right hip after a fall.  No pain in her head nor neck. Integumentary: Negative for rash. Neurological: Negative for headaches, focal weakness or numbness.   ____________________________________________   PHYSICAL EXAM:  VITAL SIGNS: ED Triage Vitals [01/21/20 0242]  Enc Vitals Group     BP (!) 153/72      Pulse Rate (!) 106     Resp (!) 21     Temp 98.7 F (37.1 C)     Temp Source Oral     SpO2 94 %     Weight      Height      Head Circumference      Peak Flow      Pain Score      Pain Loc      Pain Edu?      Excl. in [place]?     Constitutional: Alert and oriented.  Eyes: Conjunctivae are normal.  Head: Atraumatic. Nose: No congestion/rhinnorhea. Mouth/Throat: Patient is wearing a mask. Neck: No stridor.  No meningeal signs.   Cardiovascular: Normal rate, regular rhythm. Good peripheral circulation. Grossly normal heart sounds. Respiratory: Normal respiratory effort.  No retractions.  No wheezing at this time. Gastrointestinal: Soft and nontender. No distention.  Musculoskeletal: Externally rotated and shortened right lower extremity with severe pain with any movement of the right hip/femur.  Neurovascularly intact. Neurologic:  Normal speech and language. No gross focal neurologic deficits are appreciated.  Skin:  Skin is warm, dry and intact. Psychiatric: Mood and affect are normal. Speech and behavior are normal.  ____________________________________________   LABS (all labs ordered are listed, but only abnormal results are displayed)  Labs Reviewed  COMPREHENSIVE METABOLIC PANEL - Abnormal; Notable for the following components:      Result Value   Potassium 3.3 (*)    Glucose, Bld 143 (*)    Calcium 8.5 (*)    Total Protein 6.4 (*)    All other components within normal limits  CBC WITH DIFFERENTIAL/PLATELET - Abnormal; Notable for the following components:   WBC 12.4 (*)    RBC 3.59 (*)    Hemoglobin 11.1 (*)    HCT 34.6 (*)    Neutro Abs 8.5 (*)    Abs Immature Granulocytes 0.20 (*)    All other components within normal limits  URINALYSIS, ROUTINE W REFLEX MICROSCOPIC - Abnormal; Notable for the following components:   Color, Urine YELLOW (*)    APPearance HAZY (*)    Hgb urine dipstick LARGE (*)    Leukocytes,Ua SMALL (*)    All other components within  normal limits  URINE CULTURE  RESPIRATORY PANEL BY RT PCR (FLU A&B, COVID)  APTT  PROTIME-INR  TYPE AND SCREEN   ____________________________________________  EKG  ED ECG REPORT I, Hinda Kehr, the attending physician, personally viewed and interpreted this ECG.  Date: 01/21/2020 EKG Time: 3:42 AM Rate: 110 Rhythm: Sinus tachycardia QRS Axis: Borderline right axis deviation Intervals: normal ST/T Wave abnormalities: Non-specific ST segment / T-wave changes, but no clear evidence of acute ischemia. Narrative Interpretation: no definitive evidence of acute ischemia; does not meet STEMI criteria.   ____________________________________________  RADIOLOGY I, Hinda Kehr, personally viewed and evaluated these images (plain radiographs) as part of my medical decision making, as well as reviewing the written report by the radiologist.  ED MD interpretation: No acute abnormality on chest x-ray.  Acute angulated subcapital femoral neck fracture on right hip/pelvis x-rays.  Official radiology report(s): DG Chest Port 1 View  Result Date: 01/21/2020 CLINICAL DATA:  Preoperative examination, pulmonary nodule EXAM: PORTABLE CHEST 1 VIEW COMPARISON:  CT and chest radiograph 09/25/2019 FINDINGS: The left upper lobe pulmonary nodule noted on CT examination demonstrates interval growth in the interval since prior examination now measuring roughly 2.6 cm in diameter. Densely calcified nodule is again seen within the right apex, unchanged. The lungs are otherwise clear. No pneumothorax or pleural effusion. Cardiac size within normal limits. The pulmonary vascularity is normal. No acute bone abnormality. IMPRESSION: Interval enlargement of left upper lobe pulmonary nodule, now measuring 2.6 cm. Electronically Signed   By: Fidela Salisbury MD   On: 01/21/2020 04:00   DG Hip Unilat With Pelvis 2-3 Views Right  Result Date: 01/21/2020 CLINICAL DATA:  Right hip pain following fall EXAM: DG HIP (WITH  OR WITHOUT PELVIS) 2-3V RIGHT COMPARISON:  None. FINDINGS: Single view radiograph of the pelvis and two view radiograph of the right hip demonstrate a acute, subcapital right femoral neck fracture. The femoral head is still seated within the right acetabulum but demonstrates marked external rotation. The femoral shaft demonstrates superior migration, external rotation, and marked varus angulation. There is at least mild right hip degenerative arthritis with joint space narrowing noted. 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 IMPRESSION: Acute, angulated subcapital femoral neck fracture. Electronically Signed   By: Fidela Salisbury MD   On: 01/21/2020 03:58    ____________________________________________   PROCEDURES   Procedure(s) performed (including Critical Care):  Procedures   ____________________________________________   INITIAL IMPRESSION / MDM / [place] / ED COURSE  As part of my medical decision making, I reviewed the following data within the [facility] notes reviewed and incorporated, Labs reviewed , EKG interpreted , Old chart reviewed, Radiograph reviewed , Discussed with orthopedic surgeon (Dr. Harlow Mares), Discussed with admitting physician (Dr. Sidney Ace) and Notes from prior ED visits   Differential diagnosis includes, but is not limited to, hip fracture, hip dislocation, musculoskeletal strain.  I strongly suspect fracture of the femur.  Patient is neurovascularly intact.  Proceeding with standard hip fracture work-up including lab work, x-rays of the affected limb and of the chest for preop preparation, EKG.  I ordered morphine 4 mg IV as needed as well as Zofran 4 mg IV as needed.      Clinical Course as of Jan 21 440  Tue Jan 21, 2020  0420 Right displaced subcapital femoral neck fracture with angulation.  Discussed case by phone with Dr. Kurtis Bushman with orthopedic surgery.  He is aware of  the case and will see her in the morning.  I am consulting the hospitalist  service for admission.   [CF]  0422 I personally reviewed the patient's x-rays.  X-ray of the chest demonstrates a pulmonary nodule that is larger than before, but no acute abnormalities.  The x-ray of the hip and pelvis demonstrate the previously described right femoral neck fracture.   [CF]  [phone] Lab work is generally reassuring with an essentially normal comprehensive metabolic panel other than a very slightly decreased potassium.  Urinalysis demonstrates some hematuria but no evidence of active infection and I ordered a urine culture but there is no indication to treat empirically.  CBC demonstrates a very mild leukocytosis and a hemoglobin of 11.1.   [CF]  0424 Normal saline infusion at 100 mL/h has been ordered and the patient is to remain n.p.o.   [CF]  0430 Discussed case by phone with Dr. Sidney Ace who will admit.   [CF]  [phone] Verified the patient has DNR paperwork with her.   [CF]  0441 SARS Coronavirus 2 by RT PCR: NEGATIVE [CF]    Clinical Course User Index [CF] Hinda Kehr, MD     ____________________________________________  FINAL CLINICAL IMPRESSION(S) / ED DIAGNOSES  Final diagnoses:  Closed fracture of neck of right femur, initial encounter ([place])  Chronic obstructive pulmonary disease, unspecified COPD type ([place])  Pulmonary nodule  Hematuria, unspecified type     MEDICATIONS GIVEN DURING THIS VISIT:  Medications  morphine 4 MG/ML injection 4 mg (4 mg Intravenous Given 01/21/20 0326)  0.9 %  sodium chloride infusion (has no administration in time range)  morphine CONCENTRATE 10 MG/0.5ML oral solution 5 mg (has no administration in time range)  busPIRone (BUSPAR) tablet 5 mg (has no administration in time range)  escitalopram (LEXAPRO) tablet 20 mg (has no administration in time range)  hydrOXYzine (VISTARIL) capsule 25 mg (has no administration in time range)  LORazepam (ATIVAN) tablet 0.5  mg (has no administration in time range)  mirtazapine (REMERON) tablet 30 mg (has no administration in time range)  perphenazine (TRILAFON) tablet 8 mg (has no administration in time range)  zolpidem (AMBIEN) tablet 5 mg (has no administration in time range)  levothyroxine (SYNTHROID) tablet 75 mcg (has no administration in time range)  predniSONE (DELTASONE) tablet 10 mg (has no administration in time range)  calcium carbonate (TUMS - dosed in mg elemental calcium) chewable tablet 200 mg of elemental calcium (has no administration in time range)  linaclotide (LINZESS) capsule 72 mcg (has no administration in time range)  magnesium hydroxide (MILK OF MAGNESIA) suspension 30 mL (has no administration in time range)  ondansetron (ZOFRAN-ODT) disintegrating tablet 4 mg (has no administration in time range)  pantoprazole (PROTONIX) EC tablet 40 mg (has no administration in time range)  polyethylene glycol (MIRALAX / GLYCOLAX) packet 17 g (has no administration in time range)  albuterol (VENTOLIN HFA) 108 (90 Base) MCG/ACT inhaler 2 puff (has no administration in time range)  baclofen (LIORESAL) tablet 5 mg (has no administration in time range)  ondansetron (ZOFRAN) injection 4 mg (4 mg Intravenous Given 01/21/20 0325)     ED Discharge Orders    None      *Please note:  DAIZY OUTEN was evaluated in Emergency Department on 01/21/2020 for the symptoms described in the history of present illness. She was evaluated in the context of the global COVID-19 pandemic, which necessitated consideration that the patient might be at risk for infection with the SARS-CoV-2 virus that causes COVID-19. Institutional protocols and algorithms that pertain to the evaluation of patients at risk for  COVID-19 are in a state of rapid change based on information released by regulatory bodies including the CDC and federal and state organizations. These policies and algorithms were followed during the patient's care in  the ED.  Some ED evaluations and interventions may be delayed as a result of limited staffing during and after the pandemic.*  Note:  This document was prepared using Dragon voice recognition software and may include unintentional dictation errors.   Hinda Kehr, MD 01/21/20 5643    Hinda Kehr, MD 01/21/20 [phone]

## 2020-01-22 ENCOUNTER — Encounter: Payer: Self-pay | Admitting: Family Medicine

## 2020-01-22 DIAGNOSIS — N179 Acute kidney failure, unspecified: Secondary | ICD-10-CM | POA: Diagnosis not present

## 2020-01-22 DIAGNOSIS — S72001D Fracture of unspecified part of neck of right femur, subsequent encounter for closed fracture with routine healing: Secondary | ICD-10-CM

## 2020-01-22 DIAGNOSIS — F418 Other specified anxiety disorders: Secondary | ICD-10-CM | POA: Diagnosis not present

## 2020-01-22 DIAGNOSIS — Z8781 Personal history of (healed) traumatic fracture: Secondary | ICD-10-CM | POA: Diagnosis not present

## 2020-01-22 LAB — BASIC METABOLIC PANEL
Anion gap: 8 (ref 5–15)
BUN: 28 mg/dL — ABNORMAL HIGH (ref 8–23)
CO2: 32 mmol/L (ref 22–32)
Calcium: 8.1 mg/dL — ABNORMAL LOW (ref 8.9–10.3)
Chloride: 100 mmol/L (ref 98–111)
Creatinine, Ser: 1.07 mg/dL — ABNORMAL HIGH (ref 0.44–1.00)
GFR, Estimated: 53 mL/min — ABNORMAL LOW (ref >60)
Glucose, Bld: 114 mg/dL — ABNORMAL HIGH (ref 70–99)
Potassium: 4 mmol/L (ref 3.5–5.1)
Sodium: 140 mmol/L (ref 135–145)

## 2020-01-22 LAB — URINE CULTURE: Culture: NO GROWTH

## 2020-01-22 LAB — MAGNESIUM: Magnesium: 1.6 mg/dL — ABNORMAL LOW (ref 1.7–2.4)

## 2020-01-22 MED ORDER — LACTATED RINGERS IV SOLN: INTRAVENOUS | Status: DC

## 2020-01-22 MED ORDER — HYDROMORPHONE HCL 1 MG/ML IJ SOLN
0.5000 mg | INTRAMUSCULAR | Status: DC | PRN
  Administered 2020-01-22 – 2020-01-24 (×7): 0.5 mg via INTRAVENOUS
  Filled 2020-01-22 (×7): qty 1

## 2020-01-22 MED ORDER — MAGNESIUM SULFATE 2 GM/50ML IV SOLN
2.0000 g | Freq: Once | INTRAVENOUS | Status: AC
  Administered 2020-01-22: 2 g via INTRAVENOUS
  Filled 2020-01-22: qty 50

## 2020-01-22 NOTE — Consult Note (Signed)
[facility] for Electrolyte Monitoring and Replacement   Recent Labs: Potassium (mmol/L)  Date Value  01/22/2020 4.0  03/02/2014 3.3 (L)   Magnesium (mg/dL)  Date Value  01/22/2020 1.6 (L)  01/23/2014 2.2   Calcium (mg/dL)  Date Value  01/22/2020 8.1 (L)   Calcium, Total (mg/dL)  Date Value  03/02/2014 8.8   Albumin (g/dL)  Date Value  01/21/2020 3.8  03/02/2014 3.6   Phosphorus (mg/dL)  Date Value  10/07/2014 3.4   Sodium (mmol/L)  Date Value  01/22/2020 140  03/02/2014 141    Assessment: Patient is a 69 y/o F with medical history including COPD, CAD, anxiety who presented to the ED 10/12 with R hip pain after mechanical fall. Patient found to have R hip fracture. Ortho consulted and plan is for R hip hemiarthroplasty 10/12. Patient is currently NPO for surgery.   Diet: NPO MIVF: LR at 75 mL/hr  Goal of Therapy:  Electrolytes within normal limits  Plan:  --Labs today notable for Mg 1.6. K 4.0 - wnl's --Will replete Mg with 2g IV bolus, DC fluids with potassium  --Re-check electrolytes with AM labs and continue to monitor.  Lu Duffel, PharmD, BCPS Clinical Pharmacist 01/22/2020 7:37 AM

## 2020-01-22 NOTE — Anesthesia Postprocedure Evaluation (Signed)
Anesthesia Post Note  Patient: Melissa George  Procedure(s) Performed: ARTHROPLASTY BIPOLAR HIP (HEMIARTHROPLASTY) (Right Hip)  Patient location during evaluation: Nursing Unit Anesthesia Type: Spinal Level of consciousness: awake, awake and alert and oriented Pain management: pain level controlled Vital Signs Assessment: post-procedure vital signs reviewed and stable Respiratory status: spontaneous breathing, nonlabored ventilation and respiratory function stable Cardiovascular status: blood pressure returned to baseline and stable Postop Assessment: no headache and no backache Anesthetic complications: no   No complications documented.   Last Vitals:  Vitals:   01/22/20 0218 01/22/20 0620  BP: (!) 96/55 (!) 117/59  Pulse: 98 (!) 108  Resp: 16 18  Temp: 37 C 37.5 C  SpO2: 98% 98%    Last Pain:  Vitals:   01/22/20 0620  TempSrc: Oral  PainSc:                  Johnna Acosta

## 2020-01-22 NOTE — Progress Notes (Addendum)
Progress Note    Melissa George  IDP:[id number] DOB: [birth year]  DOA: 01/21/2020 PCP: Remi Haggard, FNP      Brief Narrative:    Medical records reviewed and are as summarized below:  Melissa George is a 69 y.o. female with medical history significant for CAD, COPD, depression, anxiety, who presented to the hospital with acute onset of right hip pain following a mechanical fall at the group.  She was found to have right hip fracture.  She was treated with analgesics.  She was seen in consultation by the orthopedic surgeon and she underwent right hip hemiarthroplasty on 01/21/2020.  She developed AKI requiring treatment with IV fluids.      Assessment/Plan:   Principal Problem:   Closed fracture of neck of right femur (HCC) Active Problems:   COPD (chronic obstructive pulmonary disease) (HCC)   Depression with anxiety   S/P right hip fracture   AKI (acute kidney injury) ([place]) Altered mental status/acute toxic encephalopathy Acute kidney injury  Body mass index is 18.29 kg/m.    PLAN  Patient is status post right hip hemiarthroplasty 01/21/2020. Analgesics as needed for pain Lethargy/toxic encephalopathy is likely due to IV Dilaudid.  Decreased dose of IV Dilaudid from 1 mg to 0.5 mg and minimize its use.   Treat AKI with IV fluids and monitor BMP. Continue bronchodilators for COPD. Continue psychotropics for depression and anxiety Patient is already enrolled in hospice.  Follow-up with hospice team and social worker for disposition.    Diet Order            Diet regular Room service appropriate? Yes; Fluid consistency: Thin  Diet effective now                    Consultants:  Orthopedic surgeon  Procedures:  Right hip hemiarthroplasty    Medications:   . budesonide  0.25 mg Nebulization BID  . busPIRone  5 mg Oral BID  . calcium carbonate  1 tablet Oral Daily  . docusate sodium  100 mg Oral BID  . enoxaparin (LOVENOX)  injection  40 mg Subcutaneous Q24H  . escitalopram  20 mg Oral Daily  . levothyroxine  50 mcg Oral Once per day on Mon Wed Fri  . [START ON 01/23/2020] levothyroxine  75 mcg Oral Once per day on Sun Tue Thu Sat  . linaclotide  72 mcg Oral QAC breakfast  . LORazepam  0.5 mg Oral BID  . mirtazapine  30 mg Oral QHS  . pantoprazole  40 mg Oral Daily  . perphenazine  8 mg Oral QHS  . polyethylene glycol  17 g Oral Daily  . potassium chloride  40 mEq Oral Once  . theophylline  100 mg Oral QHS  . tiotropium  18 mcg Inhalation Daily   Continuous Infusions: .  ceFAZolin (ANCEF) IV    . lactated ringers 75 mL/hr at 01/22/20 1039     Anti-infectives (From admission, onward)   Start     Dose/Rate Route Frequency Ordered Stop   01/21/20 2200  ceFAZolin (ANCEF) IVPB 1 g/50 mL premix        1 g 100 mL/hr over 30 Minutes Intravenous Every 6 hours 01/21/20 1745 01/22/20 1110   01/21/20 1519  ceFAZolin (ANCEF) 2-4 GM/100ML-% IVPB       Note to Pharmacy: Milinda Cave   : cabinet override      01/21/20 1519 01/22/20 0329   01/21/20 1517  ceFAZolin (ANCEF)  1-4 GM/50ML-% IVPB       Note to Pharmacy: Sharmon Leyden   : cabinet override      01/21/20 1517 01/21/20 2213   01/21/20 0924  ceFAZolin (ANCEF) IVPB 2g/100 mL premix        2 g 200 mL/hr over 30 Minutes Intravenous 30 min pre-op 01/21/20 1610               Family Communication/Anticipated D/C date and plan/Code Status   DVT prophylaxis: enoxaparin (LOVENOX) injection 40 mg Start: 01/22/20 0800 SCDs Start: 01/21/20 1746 Place and maintain sequential compression device Start: 01/21/20 0704     Code Status: DNR  Family Communication: None Disposition Plan:    Status is: Inpatient  Remains inpatient appropriate because:Altered mental status and Unsafe d/c plan   Dispo: The patient is from: Group home              Anticipated d/c is to: SNF              Anticipated d/c date is: 1 day              Patient currently is  not medically stable to d/c.           Subjective:   Patient was unable to provide an adequate history because she was drowsy/lethargic at the time of my visit.  According to her nurse, she had given her Dilaudid previously because of severe pain in the right hip.  Objective:    Vitals:   01/22/20 0218 01/22/20 0620 01/22/20 0907 01/22/20 1123  BP: (!) 96/55 (!) 117/59 102/65 95/82  Pulse: 98 (!) 108 (!) 110 100  Resp: 16 18 18 18   Temp: 98.6 F (37 C) 99.5 F (37.5 C) 99.5 F (37.5 C) 98.3 F (36.8 C)  TempSrc: Oral Oral Oral Oral  SpO2: 98% 98% 97% 98%  Weight:      Height:       No data found.   Intake/Output Summary (Last 24 hours) at 01/22/2020 1430 Last data filed at 01/22/2020 1100 Gross per 24 hour  Intake 2653.17 ml  Output 555 ml  Net 2098.17 ml   Filed Weights   01/21/20 1513  Weight: 45.4 kg    Exam:  GEN: NAD SKIN: Warm and dry EYES: No pallor or icterus ENT: MMM CV: RRR PULM: CTA B ABD: soft, ND, NT, +BS CNS: Lethargic but responsive to verbal stimuli.  Non focal EXT: Right hip surgical wound with dry dressing.  Some swelling noted on the right hip.   Data Reviewed:   I have personally reviewed following labs and imaging studies:  Labs: Labs show the following:   Basic Metabolic Panel: Recent Labs  Lab 01/21/20 0247 01/21/20 0247 01/21/20 0525 01/22/20 0409  NA 140  --  141 140  K 3.3*   < > 3.2* 4.0  CL 99  --  98 100  CO2 32  --  36* 32  GLUCOSE 143*  --  145* 114*  BUN 19  --  21 28*  CREATININE 0.56  --  0.61 1.07*  CALCIUM 8.5*  --  8.8* 8.1*  MG  --   --  1.7 1.6*   < > = values in this interval not displayed.   GFR Estimated Creatinine Clearance: 35.1 mL/min (A) (by C-G formula based on SCr of 1.07 mg/dL (H)). Liver Function Tests: Recent Labs  Lab 01/21/20 0247  AST 18  ALT 14  ALKPHOS 56  BILITOT 0.5  PROT 6.4*  ALBUMIN 3.8   No results for input(s): LIPASE, AMYLASE in the last 168 hours. No  results for input(s): AMMONIA in the last 168 hours. Coagulation profile Recent Labs  Lab 01/21/20 0247  INR 0.9    CBC: Recent Labs  Lab 01/21/20 0247 01/21/20 0525  WBC 12.4* 13.9*  NEUTROABS 8.5*  --   HGB 11.1* 10.7*  HCT 34.6* 33.0*  MCV 96.4 96.2  PLT 195 158   Cardiac Enzymes: No results for input(s): CKTOTAL, CKMB, CKMBINDEX, TROPONINI in the last 168 hours. BNP (last 3 results) No results for input(s): PROBNP in the last 8760 hours. CBG: No results for input(s): GLUCAP in the last 168 hours. D-Dimer: No results for input(s): DDIMER in the last 72 hours. Hgb A1c: No results for input(s): HGBA1C in the last 72 hours. Lipid Profile: No results for input(s): CHOL, HDL, LDLCALC, TRIG, CHOLHDL, LDLDIRECT in the last 72 hours. Thyroid function studies: Recent Labs    01/21/20 0525  TSH 0.679   Anemia work up: No results for input(s): VITAMINB12, FOLATE, FERRITIN, TIBC, IRON, RETICCTPCT in the last 72 hours. Sepsis Labs: Recent Labs  Lab 01/21/20 0247 01/21/20 0525  WBC 12.4* 13.9*    Microbiology Recent Results (from the past 240 hour(s))  Urine culture     Status: None   Collection Time: 01/21/20  3:29 AM   Specimen: Urine, Random  Result Value Ref Range Status   Specimen Description   Final    URINE, RANDOM Performed at [facility], [address] [place], [place] [address]    Special Requests   Final    NONE Performed at [facility], [address] [place], [place] [address]    Culture   Final    NO GROWTH Performed at [facility], [place] [address] [place],  [address]    Report Status 01/22/2020 FINAL  Final  Respiratory Panel by RT PCR (Flu A&B, Covid) -     Status: None   Collection Time: 01/21/20  3:29 AM  Result Value Ref Range Status   SARS Coronavirus 2 by RT PCR NEGATIVE NEGATIVE Final    Comment: (NOTE) SARS-CoV-2 target nucleic acids are NOT DETECTED.  The SARS-CoV-2 RNA is generally  detectable in upper respiratoy specimens during the acute phase of infection. The lowest concentration of SARS-CoV-2 viral copies this assay can detect is 131 copies/mL. A negative result does not preclude SARS-Cov-2 infection and should not be used as the sole basis for treatment or other patient management decisions. A negative result may occur with  improper specimen collection/handling, submission of specimen other than nasopharyngeal swab, presence of viral mutation(s) within the areas targeted by this assay, and inadequate number of viral copies (<131 copies/mL). A negative result must be combined with clinical observations, patient history, and epidemiological information. The expected result is Negative.  Fact Sheet for Patients:  PinkCheek.be  Fact Sheet for Healthcare Providers:  GravelBags.it  This test is no t yet approved or cleared by the [place] FDA and  has been authorized for detection and/or diagnosis of SARS-CoV-2 by FDA under an Emergency Use Authorization (EUA). This EUA will remain  in effect (meaning this test can be used) for the duration of the COVID-19 declaration under Section 564(b)(1) of the Act, 21 U.S.C. section 360bbb-3(b)(1), unless the authorization is terminated or revoked sooner.     Influenza A by PCR NEGATIVE NEGATIVE Final   Influenza B by PCR NEGATIVE NEGATIVE Final  Comment: (NOTE) The Xpert Xpress SARS-CoV-2/FLU/RSV assay is intended as an aid in  the diagnosis of influenza from Nasopharyngeal swab specimens and  should not be used as a sole basis for treatment. Nasal washings and  aspirates are unacceptable for Xpert Xpress SARS-CoV-2/FLU/RSV  testing.  Fact Sheet for Patients: PinkCheek.be  Fact Sheet for Healthcare Providers: GravelBags.it  This test is not yet approved or cleared by the [place] FDA and    has been authorized for detection and/or diagnosis of SARS-CoV-2 by  FDA under an Emergency Use Authorization (EUA). This EUA will remain  in effect (meaning this test can be used) for the duration of the  Covid-19 declaration under Section 564(b)(1) of the Act, 21  U.S.C. section 360bbb-3(b)(1), unless the authorization is  terminated or revoked. Performed at [facility], [address] [place], [place] [address]     Procedures and diagnostic studies:  DG Chest [facility] 1 View  Result Date: 01/21/2020 CLINICAL DATA:  Preoperative examination, pulmonary nodule EXAM: PORTABLE CHEST 1 VIEW COMPARISON:  CT and chest radiograph 09/25/2019 FINDINGS: The left upper lobe pulmonary nodule noted on CT examination demonstrates interval growth in the interval since prior examination now measuring roughly 2.6 cm in diameter. Densely calcified nodule is again seen within the right apex, unchanged. The lungs are otherwise clear. No pneumothorax or pleural effusion. Cardiac size within normal limits. The pulmonary vascularity is normal. No acute bone abnormality. IMPRESSION: Interval enlargement of left upper lobe pulmonary nodule, now measuring 2.6 cm. Electronically Signed   By: Fidela Salisbury MD   On: 01/21/2020 04:00   DG HIP OPERATIVE UNILAT W OR W/O PELVIS RIGHT  Result Date: 01/21/2020 CLINICAL DATA:  Right hip fracture EXAM: OPERATIVE RIGHT HIP WITH PELVIS COMPARISON:  None. FLUOROSCOPY TIME:  Radiation Exposure Index (as provided by the fluoroscopic device): 0.7 mGy If the device does not provide the exposure index: Fluoroscopy Time:  1 second Number of Acquired Images:  1 FINDINGS: Right hip hemiarthroplasty is noted in satisfactory position. No acute soft tissue or bony abnormalities are noted. IMPRESSION: Status post right hip hemiarthroplasty Electronically Signed   By: Inez Catalina M.D.   On: 01/21/2020 18:02   DG Hip Unilat With Pelvis 2-3 Views Right  Result Date:  01/21/2020 CLINICAL DATA:  Right hip pain following fall EXAM: DG HIP (WITH OR WITHOUT PELVIS) 2-3V RIGHT COMPARISON:  None. FINDINGS: Single view radiograph of the pelvis and two view radiograph of the right hip demonstrate a acute, subcapital right femoral neck fracture. The femoral head is still seated within the right acetabulum but demonstrates marked external rotation. The femoral shaft demonstrates superior migration, external rotation, and marked varus angulation. There is at least mild right hip degenerative arthritis with joint space narrowing noted. 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 IMPRESSION: Acute, angulated subcapital femoral neck fracture. Electronically Signed   By: Fidela Salisbury MD   On: 01/21/2020 03:58               LOS: 1 day   [facility]   Pager on [email or web address]. If 7PM-7AM, please contact night-coverage at [email or web address]     01/22/2020, 2:30 PM

## 2020-01-22 NOTE — Evaluation (Signed)
Physical Therapy Evaluation Patient Details Name: Melissa George MRN: [id number] DOB: 1951-04-01 Today's Date: 01/22/2020   History of Present Illness  Melissa George is a 69 y/o female who underwent R anterior hip hemiarthroplasty after sustaining a mechanical fall. 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  Clinical Impression  Pt lying in bed with eyes half open with abduction pillow wedge between BLE. Pt required verbal stimulation to rouse and was noted to drift in and out of sleep requiring tactile cues for continued alertness. Pt on 4L via nasal cannula and reports 8/10 pain in R hip. Pt with very low phonation throughout session and often mouths answers. Pt performed therex in bed for promotion of soft tissue flexibility and muscle strengthening, requiring min A for concentric/eccentric movements due to pain and weakness. Pt required continued multimodal cues for attention to task and for continued participation. Mod - max A for bed mobility for truncal and BLE management. Pt required mod A for sitting balance and noted labored breathing with O2 sats in low 90s. Pt performed sit <> stand with mod A for boosting hips to stand and standing balance. Pt attempted to march in place for weight shifting and sat quickly to bed due to fatigue. Pt then unable to hold trunk upright and returned to supine, needing total A to scoot to [facility]. Pt presents with deficits in strength, functional mobility, pain, functional activity tolerance, and balance. Pt educated on frequency of treatments during acute stay and WB status. Pt remained above 90% SpO2 with all activities. Pt unsafe at this time to return to group home setting due to current level of function. Pt would benefit from skilled PT to address aforementioned deficits and STR at discharge to maximize safety and independence with functional mobility. Unable to set bed alarm on bed due to increased  sensitivity.   Follow Up Recommendations SNF;Supervision for mobility/OOB    Equipment Recommendations  Rolling walker with 5" wheels;3in1 (PT)    Recommendations for Other Services OT consult     Precautions / Restrictions Precautions Precautions: Fall Restrictions Weight Bearing Restrictions: Yes RLE Weight Bearing: Weight bearing as tolerated      Mobility  Bed Mobility Overal bed mobility: Needs Assistance Bed Mobility: Supine to Sit;Sit to Supine     Supine to sit: Mod assist Sit to supine: Max assist   General bed mobility comments: Supine to sit required mod A for scooting hips toward EOB and for trunk management; max A for sit to supine for trunk control and BLE elevation onto bed; total A for scooting toward [facility]  Transfers Overall transfer level: Needs assistance Equipment used: Rolling walker (2 wheeled) Transfers: Sit to/from Stand Sit to Stand: Mod assist         General transfer comment: Mod A for sit <> stand transfer for boost into standing and for eccentric control on descent.  Ambulation/Gait             General Gait Details: not performed - unsafe at this time  Stairs            Wheelchair Mobility    Modified Rankin (Stroke Patients Only)       Balance Overall balance assessment: Needs assistance Sitting-balance support: Bilateral upper extremity supported;Feet supported Sitting balance-Leahy Scale: Fair Sitting balance - Comments: pt required mod A initially for sitting balance then progressed to min A with BUE/BLE support Postural control: Posterior lean Standing balance support: Bilateral upper extremity supported;During functional  activity Standing balance-Leahy Scale: Poor Standing balance comment: Poor standing balance with posterior lean requiring mod A initially then max A with BUE on RW                             Pertinent Vitals/Pain Pain Assessment: 0-10 Pain Score: 8  Pain Location: R hip Pain  Descriptors / Indicators: Aching;Discomfort;Operative site guarding;Sore Pain Intervention(s): Monitored during session;Limited activity within patient's tolerance;Patient requesting pain meds-RN notified    Home Living Family/patient expects to be discharged to:: Group home                 Additional Comments: patient reports 3-4 steps with railling to enter however unclear on laterality; pt reports owning walker but unclear of what type    Prior Function Level of Independence: Needs assistance   Gait / Transfers Assistance Needed: pt reports usage of walker but states that someone at the group home told her that she did not need it anymore and she stopped using it; pt unable to give time periods in which this occurred  ADL's / Homemaking Assistance Needed: reports staff at group home provide assistance for cooking, bathing, etc.  Comments: Pt reports 4L O2 usage at home at baseline; pt also reports multiple fall history     Hand Dominance        Extremity/Trunk Assessment   Upper Extremity Assessment Upper Extremity Assessment: Generalized weakness (grossly 3+ to 4-/5 bilaterally)    Lower Extremity Assessment Lower Extremity Assessment: RLE deficits/detail;Generalized weakness (LLE grossly 3+ to 4-/5) RLE Deficits / Details: surgical site; pt unable to lift RLE against gravity and requires assist to move in gravity eliminated position RLE: Unable to fully assess due to pain RLE Sensation: WNL       Communication   Communication: No difficulties  Cognition Arousal/Alertness: Lethargic Behavior During Therapy: WFL for tasks assessed/performed;Flat affect Overall Cognitive Status: Within Functional Limits for tasks assessed                                 General Comments: Pt alert and oriented x 4.      General Comments      Exercises Other Exercises Other Exercises: pt performed AP, QS, GS, and hip ab/add x 10 on RLE   Assessment/Plan    PT  Assessment Patient needs continued PT services  PT Problem List Decreased strength;Decreased activity tolerance;Decreased balance;Decreased mobility;Decreased coordination;Decreased knowledge of precautions;Cardiopulmonary status limiting activity;Pain       PT Treatment Interventions DME instruction;Gait training;Stair training;Functional mobility training;Therapeutic activities;Therapeutic exercise;Balance training;Patient/family education    PT Goals (Current goals can be found in the Care Plan section)  Acute Rehab PT Goals Patient Stated Goal: none state PT Goal Formulation: With patient Time For Goal Achievement: 02/05/20 Potential to Achieve Goals: Fair    Frequency BID   Barriers to discharge        Co-evaluation               AM-PAC PT "6 Clicks" Mobility  Outcome Measure Help needed turning from your back to your side while in a flat bed without using bedrails?: A Lot Help needed moving from lying on your back to sitting on the side of a flat bed without using bedrails?: A Lot Help needed moving to and from a bed to a chair (including a wheelchair)?: A Lot Help needed standing up  from a chair using your arms (e.g., wheelchair or bedside chair)?: A Lot Help needed to walk in hospital room?: A Lot Help needed climbing 3-5 steps with a railing? : A Lot 6 Click Score: 12    End of Session Equipment Utilized During Treatment: Gait belt;Oxygen;Other (comment) (4L O2) Activity Tolerance: Patient limited by lethargy;Patient limited by fatigue;Patient limited by pain Patient left: in bed;with call bell/phone within reach;Other (comment) (unable to set bed alarm due to increased sensitivity) Nurse Communication: Mobility status PT Visit Diagnosis: Unsteadiness on feet (R26.81);Other abnormalities of gait and mobility (R26.89);Muscle weakness (generalized) (M62.81);Repeated falls (R29.6);History of falling (Z91.81);Pain Pain - Right/Left: Right Pain - part of body: Hip     Time: 2395-3202 PT Time Calculation (min) (ACUTE ONLY): 42 min   Charges:              Vale Haven, SPT  Vale Haven 01/22/2020, 4:24 PM

## 2020-01-22 NOTE — Progress Notes (Signed)
Subjective:  Patient reports pain as mild to moderate.    Objective:   VITALS:   Vitals:   01/21/20 2120 01/21/20 2217 01/22/20 0218 01/22/20 0620  BP: 106/67 91/69 (!) 96/55 (!) 117/59  Pulse: (!) 106 90 98 (!) 108  Resp: 18 20 16 18   Temp: 98.7 F (37.1 C) 97.9 F (36.6 C) 98.6 F (37 C) 99.5 F (37.5 C)  TempSrc: Oral Oral Oral Oral  SpO2: 93% 95% 98% 98%  Weight:      Height:        PHYSICAL EXAM:  Neurologically intact ABD soft Neurovascular intact Sensation intact distally Intact pulses distally Dorsiflexion/Plantar flexion intact Incision: scant drainage No cellulitis present Compartment soft  LABS  Results for orders placed or performed during the hospital encounter of 01/21/20 (from the past 24 hour(s))  Basic metabolic panel     Status: Abnormal   Collection Time: 01/22/20  4:09 AM  Result Value Ref Range   Sodium 140 135 - 145 mmol/L   Potassium 4.0 3.5 - 5.1 mmol/L   Chloride 100 98 - 111 mmol/L   CO2 32 22 - 32 mmol/L   Glucose, Bld 114 (H) 70 - 99 mg/dL   BUN 28 (H) 8 - 23 mg/dL   Creatinine, Ser 1.07 (H) 0.44 - 1.00 mg/dL   Calcium 8.1 (L) 8.9 - 10.3 mg/dL   GFR, Estimated 53 (L) >60 mL/min   Anion gap 8 5 - 15  Magnesium     Status: Abnormal   Collection Time: 01/22/20  4:09 AM  Result Value Ref Range   Magnesium 1.6 (L) 1.7 - 2.4 mg/dL    DG Chest Port 1 View  Result Date: 01/21/2020 CLINICAL DATA:  Preoperative examination, pulmonary nodule EXAM: PORTABLE CHEST 1 VIEW COMPARISON:  CT and chest radiograph 09/25/2019 FINDINGS: The left upper lobe pulmonary nodule noted on CT examination demonstrates interval growth in the interval since prior examination now measuring roughly 2.6 cm in diameter. Densely calcified nodule is again seen within the right apex, unchanged. The lungs are otherwise clear. No pneumothorax or pleural effusion. Cardiac size within normal limits. The pulmonary vascularity is normal. No acute bone abnormality.  IMPRESSION: Interval enlargement of left upper lobe pulmonary nodule, now measuring 2.6 cm. Electronically Signed   By: Fidela Salisbury MD   On: 01/21/2020 04:00   DG HIP OPERATIVE UNILAT W OR W/O PELVIS RIGHT  Result Date: 01/21/2020 CLINICAL DATA:  Right hip fracture EXAM: OPERATIVE RIGHT HIP WITH PELVIS COMPARISON:  None. FLUOROSCOPY TIME:  Radiation Exposure Index (as provided by the fluoroscopic device): 0.7 mGy If the device does not provide the exposure index: Fluoroscopy Time:  1 second Number of Acquired Images:  1 FINDINGS: Right hip hemiarthroplasty is noted in satisfactory position. No acute soft tissue or bony abnormalities are noted. IMPRESSION: Status post right hip hemiarthroplasty Electronically Signed   By: Inez Catalina M.D.   On: 01/21/2020 18:02   DG Hip Unilat With Pelvis 2-3 Views Right  Result Date: 01/21/2020 CLINICAL DATA:  Right hip pain following fall EXAM: DG HIP (WITH OR WITHOUT PELVIS) 2-3V RIGHT COMPARISON:  None. FINDINGS: Single view radiograph of the pelvis and two view radiograph of the right hip demonstrate a acute, subcapital right femoral neck fracture. The femoral head is still seated within the right acetabulum but demonstrates marked external rotation. The femoral shaft demonstrates superior migration, external rotation, and marked varus angulation. There is at least mild right hip degenerative arthritis with joint space  narrowing noted. 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 IMPRESSION: Acute, angulated subcapital femoral neck fracture. Electronically Signed   By: Fidela Salisbury MD   On: 01/21/2020 03:58    Assessment/Plan: 1 Day Post-Op   Active Problems:   Depression with anxiety   S/P right hip fracture   Closed fracture of neck of right femur (HCC)   Advance diet Up with therapy  WBAT RLE Advance diet Up with therapy  Discharge per Medicine, probably SNF Continue pain control Continue Lovenox  40mg  daily for 14 days last dose, 12/05/19 Follow with Dr. Harlow Mares office in 2 weeks for stable removal, call office for appt. [phone], Estimate follow up on 12/05/19  Carlynn Spry , PA-C 01/22/2020, 6:45 AM

## 2020-01-22 NOTE — Progress Notes (Signed)
PT Cancellation Note  Patient Details Name: Melissa George MRN: [id number] DOB: April 11, 1951   Cancelled Treatment:    Reason Eval/Treat Not Completed: Other (comment) Pt order received and chart reviewed. Made two attempts to see pt this morning. First attempt, pt was being assisted with breakfast. Second attempt, pt was too drowsy and unable to remain alert/roused for participation in evaluation. Will attempt at a later time when appropriate.   Vale Haven 01/22/2020, 10:15 AM

## 2020-01-22 NOTE — Progress Notes (Addendum)
[facility] hospital liaison note:  Patient is currently followed by [facility] hosp[facility] services at [place] care home with a hospice diagnosis of emphysema. She is a DNR code with out of facility DNR in place in the home.  Patient was sent to the [facility] ED following a fall at her group home Hospice was not notified prior to transport. In the ED right hip xray revealed an acute angulated subcapital femoral neck fracture. This is a related admission per hospice physician Dr. Gilford Rile.  Patient is now post op day 1.  Visited with patient at bedside. She is withdrawn and reports pain. She has received 3 doses of IV Dilaudid 1 mg and a dose of acetaminophen. Assisted her with her lunch tray, she ate only bites, encouraged fluid intake. Much emotional support given  Please note patient is withdrawn at baseline with a very soft voice and is also slow to respond at times. She ambulates with a walker to the bathroom and dining room at baseline and does experience dyspnea with minimal exertion. She also spends a great deal of time sleeping per her hospice RN. Staff RN, aide, TOC and PT all made aware.  VS: 98.3 oral, 95/82, 100, 18 I/O +149.3 IV/ PRNs: Lactated ringers 75 ml/hr continuous, IV dilaudid 0.5 mg-1 mh Q 4hrs PRN, Zofran 4 mg IV Q 4 hrs PRN nausea  No new imaging  Hospital problem list: Principal Problem:   Closed fracture of neck of right femur (HCC) Active Problems:   COPD (chronic obstructive pulmonary disease) (HCC)   Depression with anxiety   S/P right hip fracture   AKI (acute kidney injury) ([place]) Altered mental status/acute toxic encephalopathy Acute kidney injury  Body mass index is 18.29 kg/m.    PLAN  Patient is status post right hip hemiarthroplasty 01/21/2020. Analgesics as needed for pain Lethargy/toxic encephalopathy is likely due to IV Dilaudid.  Decreased dose of IV Dilaudid from 1 mg to 0.5 mg and minimize its use.   Treat  AKI with IV fluids and monitor BMP. Continue bronchodilators for COPD. Continue psychotropics for depression and anxiety Patient is already enrolled in hospice.  Follow-up with hospice team and social worker for disposition.  IDG: updated Discharge planning: Liaison and hospital TOC to work with DSS [place] regarding dsicahrge plan. PT is recommending short term rehab. Goals of Care: DNR  Will continue to follow and assist with discharge planning. Please call with any hospice related questions or needs. Thank you.  Flo Shanks BSN, RN, [place] [phone]

## 2020-01-22 NOTE — Plan of Care (Signed)

## 2020-01-23 DIAGNOSIS — S72001D Fracture of unspecified part of neck of right femur, subsequent encounter for closed fracture with routine healing: Secondary | ICD-10-CM | POA: Diagnosis not present

## 2020-01-23 DIAGNOSIS — Z8781 Personal history of (healed) traumatic fracture: Secondary | ICD-10-CM | POA: Diagnosis not present

## 2020-01-23 DIAGNOSIS — N179 Acute kidney failure, unspecified: Secondary | ICD-10-CM | POA: Diagnosis not present

## 2020-01-23 DIAGNOSIS — F418 Other specified anxiety disorders: Secondary | ICD-10-CM | POA: Diagnosis not present

## 2020-01-23 LAB — CBC WITH DIFFERENTIAL/PLATELET
Abs Immature Granulocytes: 0.06 10*3/uL (ref 0.00–0.07)
Basophils Absolute: 0 10*3/uL (ref 0.0–0.1)
Basophils Relative: 0 %
Eosinophils Absolute: 0.1 10*3/uL (ref 0.0–0.5)
Eosinophils Relative: 1 %
HCT: 22.9 % — ABNORMAL LOW (ref 36.0–46.0)
Hemoglobin: 7.3 g/dL — ABNORMAL LOW (ref 12.0–15.0)
Immature Granulocytes: 1 %
Lymphocytes Relative: 6 %
Lymphs Abs: 0.5 10*3/uL — ABNORMAL LOW (ref 0.7–4.0)
MCH: 31.6 pg (ref 26.0–34.0)
MCHC: 31.9 g/dL (ref 30.0–36.0)
MCV: 99.1 fL (ref 80.0–100.0)
Monocytes Absolute: 1 10*3/uL (ref 0.1–1.0)
Monocytes Relative: 11 %
Neutro Abs: 7.4 10*3/uL (ref 1.7–7.7)
Neutrophils Relative %: 81 %
Platelets: 123 10*3/uL — ABNORMAL LOW (ref 150–400)
RBC: 2.31 MIL/uL — ABNORMAL LOW (ref 3.87–5.11)
RDW: 12.3 % (ref 11.5–15.5)
WBC: 9.1 10*3/uL (ref 4.0–10.5)
nRBC: 0 % (ref 0.0–0.2)

## 2020-01-23 LAB — MAGNESIUM: Magnesium: 2.2 mg/dL (ref 1.7–2.4)

## 2020-01-23 LAB — BASIC METABOLIC PANEL
Anion gap: 8 (ref 5–15)
BUN: 25 mg/dL — ABNORMAL HIGH (ref 8–23)
CO2: 32 mmol/L (ref 22–32)
Calcium: 8.3 mg/dL — ABNORMAL LOW (ref 8.9–10.3)
Chloride: 100 mmol/L (ref 98–111)
Creatinine, Ser: 0.8 mg/dL (ref 0.44–1.00)
GFR, Estimated: >60 mL/min (ref >60)
Glucose, Bld: 105 mg/dL — ABNORMAL HIGH (ref 70–99)
Potassium: 4 mmol/L (ref 3.5–5.1)
Sodium: 140 mmol/L (ref 135–145)

## 2020-01-23 MED ORDER — LORAZEPAM 0.5 MG PO TABS
0.5000 mg | ORAL_TABLET | Freq: Three times a day (TID) | ORAL | Status: DC
  Administered 2020-01-23 – 2020-01-30 (×22): 0.5 mg via ORAL
  Filled 2020-01-23 (×24): qty 1

## 2020-01-23 NOTE — Progress Notes (Signed)
Subjective:  Patient reports pain as mild to moderate.  Patient reports mild dizziness with PT and standing.  Objective:   VITALS:   Vitals:   01/22/20 1123 01/22/20 1605 01/22/20 1953 01/22/20 2350  BP: 95/82 112/78  (!) 157/76  Pulse: 100 (!) 102  66  Resp: 18 16  16   Temp: 98.3 F (36.8 C) 98.2 F (36.8 C)  99.1 F (37.3 C)  TempSrc: Oral Oral  Oral  SpO2: 98% 100% 98% (!) 84%  Weight:      Height:        PHYSICAL EXAM:  Neurologically intact ABD soft Neurovascular intact Sensation intact distally Intact pulses distally Dorsiflexion/Plantar flexion intact Incision: scant drainage No cellulitis present Compartment soft dressing changed, New aquacel may stay in place until follow up unless saturated.  LABS  Results for orders placed or performed during the hospital encounter of 01/21/20 (from the past 24 hour(s))  Basic metabolic panel     Status: Abnormal   Collection Time: 01/23/20  2:58 AM  Result Value Ref Range   Sodium 140 135 - 145 mmol/L   Potassium 4.0 3.5 - 5.1 mmol/L   Chloride 100 98 - 111 mmol/L   CO2 32 22 - 32 mmol/L   Glucose, Bld 105 (H) 70 - 99 mg/dL   BUN 25 (H) 8 - 23 mg/dL   Creatinine, Ser 0.80 0.44 - 1.00 mg/dL   Calcium 8.3 (L) 8.9 - 10.3 mg/dL   GFR, Estimated >60 >60 mL/min   Anion gap 8 5 - 15  Magnesium     Status: None   Collection Time: 01/23/20  2:58 AM  Result Value Ref Range   Magnesium 2.2 1.7 - 2.4 mg/dL  CBC with Differential/Platelet     Status: Abnormal   Collection Time: 01/23/20  2:58 AM  Result Value Ref Range   WBC 9.1 4.0 - 10.5 K/uL   RBC 2.31 (L) 3.87 - 5.11 MIL/uL   Hemoglobin 7.3 (L) 12.0 - 15.0 g/dL   HCT 22.9 (L) 36 - 46 %   MCV 99.1 80.0 - 100.0 fL   MCH 31.6 26.0 - 34.0 pg   MCHC 31.9 30.0 - 36.0 g/dL   RDW 12.3 11.5 - 15.5 %   Platelets 123 (L) 150 - 400 K/uL   nRBC 0.0 0.0 - 0.2 %   Neutrophils Relative % 81 %   Neutro Abs 7.4 1.7 - 7.7 K/uL   Lymphocytes Relative 6 %   Lymphs Abs 0.5 (L)  0.7 - 4.0 K/uL   Monocytes Relative 11 %   Monocytes Absolute 1.0 0.1 - 1.0 K/uL   Eosinophils Relative 1 %   Eosinophils Absolute 0.1 0.0 - 0.5 K/uL   Basophils Relative 0 %   Basophils Absolute 0.0 0.0 - 0.1 K/uL   Immature Granulocytes 1 %   Abs Immature Granulocytes 0.06 0.00 - 0.07 K/uL    DG HIP OPERATIVE UNILAT W OR W/O PELVIS RIGHT  Result Date: 01/21/2020 CLINICAL DATA:  Right hip fracture EXAM: OPERATIVE RIGHT HIP WITH PELVIS COMPARISON:  None. FLUOROSCOPY TIME:  Radiation Exposure Index (as provided by the fluoroscopic device): 0.7 mGy If the device does not provide the exposure index: Fluoroscopy Time:  1 second Number of Acquired Images:  1 FINDINGS: Right hip hemiarthroplasty is noted in satisfactory position. No acute soft tissue or bony abnormalities are noted. IMPRESSION: Status post right hip hemiarthroplasty Electronically Signed   By: Inez Catalina M.D.   On: 01/21/2020 18:02  Assessment/Plan: 2 Days Post-Op   Principal Problem:   Closed fracture of neck of right femur (HCC) Active Problems:   COPD (chronic obstructive pulmonary disease) (HCC)   Depression with anxiety   S/P right hip fracture   AKI (acute kidney injury) ([place])   Advance diet Up with therapy   WBAT RLE Advance diet Up with therapy  Continue pain control Hemoglobin 7.3 and mildly symptomatic, Will stop Lovenox Follow with Dr. Harlow Mares office in 2 weeks for stable removal, call office for appt. [phone], Estimate follow up on 12/05/19  Patient is already enrolled in hospice. Follow-up with hospice team and social worker for disposition.   Carlynn Spry , PA-C 01/23/2020, 6:48 AM

## 2020-01-23 NOTE — Consult Note (Signed)
[facility] for Electrolyte Monitoring and Replacement   Recent Labs: Potassium (mmol/L)  Date Value  01/23/2020 4.0  03/02/2014 3.3 (L)   Magnesium (mg/dL)  Date Value  01/23/2020 2.2  01/23/2014 2.2   Calcium (mg/dL)  Date Value  01/23/2020 8.3 (L)   Calcium, Total (mg/dL)  Date Value  03/02/2014 8.8   Albumin (g/dL)  Date Value  01/21/2020 3.8  03/02/2014 3.6   Phosphorus (mg/dL)  Date Value  10/07/2014 3.4   Sodium (mmol/L)  Date Value  01/23/2020 140  03/02/2014 141    Assessment: Patient is a 69 y/o F with medical history including COPD, CAD, anxiety who presented to the ED 10/12 with R hip pain after mechanical fall. Patient found to have R hip fracture. Ortho consulted and plan is for R hip hemiarthroplasty 10/12. Patient is currently NPO for surgery.   Diet: NPO MIVF: LR at 75 mL/hr  Goal of Therapy:  Electrolytes within normal limits  Plan:  --Labs today notable for Mg 2.2. K 4.0 - wnl's --No replenishment warranted today --Re-check electrolytes with AM labs and continue to monitor.  Lu Duffel, PharmD, BCPS Clinical Pharmacist 01/23/2020 7:37 AM

## 2020-01-23 NOTE — Progress Notes (Addendum)
Progress Note    LAZARA GRIESER  OJJ:[id number] DOB: 06/20/1950  DOA: 01/21/2020 PCP: Remi Haggard, FNP      Brief Narrative:    Medical records reviewed and are as summarized below:  ALBERTIA CARVIN is a 69 y.o. female with medical history significant for CAD, COPD, depression, anxiety, who presented to the hospital with acute onset of right hip pain following a mechanical fall at the group.  She was found to have right hip fracture.  She was treated with analgesics.  She was seen in consultation by the orthopedic surgeon and she underwent right hip hemiarthroplasty on 01/21/2020.  She developed AKI requiring treatment with IV fluids.      Assessment/Plan:   Principal Problem:   Closed fracture of neck of right femur (HCC) Active Problems:   COPD (chronic obstructive pulmonary disease) (HCC)   Depression with anxiety   S/P right hip fracture   AKI (acute kidney injury) ([place]) Altered mental status/acute toxic encephalopathy Acute kidney injury Acute blood loss anemia   Body mass index is 18.29 kg/m.    PLAN  Patient is status post right hip hemiarthroplasty 01/21/2020. No indication for blood transfusion at this time.  Monitor H&H. Continue analgesics as needed for pain Discontinue IV fluids since creatinine has improved. Continue bronchodilators for COPD Continue 4 L/min oxygen via nasal cannula for chronic hypoxemic respiratory failure. Continue psychotropics for depression and anxiety Awaiting placement to SNF for short-term rehab versus home with hospice.  Follow-up with social worker to assist with disposition.    Diet Order            Diet regular Room service appropriate? Yes; Fluid consistency: Thin  Diet effective now                    Consultants:  Orthopedic surgeon  Procedures:  Right hip hemiarthroplasty    Medications:   . budesonide  0.25 mg Nebulization BID  . busPIRone  5 mg Oral BID  . calcium carbonate   1 tablet Oral Daily  . docusate sodium  100 mg Oral BID  . escitalopram  20 mg Oral Daily  . levothyroxine  50 mcg Oral Once per day on Mon Wed Fri  . levothyroxine  75 mcg Oral Once per day on Sun Tue Thu Sat  . linaclotide  72 mcg Oral QAC breakfast  . LORazepam  0.5 mg Oral BID  . mirtazapine  30 mg Oral QHS  . pantoprazole  40 mg Oral Daily  . perphenazine  8 mg Oral QHS  . polyethylene glycol  17 g Oral Daily  . potassium chloride  40 mEq Oral Once  . theophylline  100 mg Oral QHS  . tiotropium  18 mcg Inhalation Daily   Continuous Infusions: .  ceFAZolin (ANCEF) IV       Anti-infectives (From admission, onward)   Start     Dose/Rate Route Frequency Ordered Stop   01/21/20 2200  ceFAZolin (ANCEF) IVPB 1 g/50 mL premix        1 g 100 mL/hr over 30 Minutes Intravenous Every 6 hours 01/21/20 1745 01/22/20 1110   01/21/20 1519  ceFAZolin (ANCEF) 2-4 GM/100ML-% IVPB       Note to Pharmacy: Milinda Cave   : cabinet override      01/21/20 1519 01/22/20 0329   01/21/20 1517  ceFAZolin (ANCEF) 1-4 GM/50ML-% IVPB       Note to Pharmacy: Sharmon Leyden   :  cabinet override      01/21/20 1517 01/22/20 2105   01/21/20 0924  ceFAZolin (ANCEF) IVPB 2g/100 mL premix        2 g 200 mL/hr over 30 Minutes Intravenous 30 min pre-op 01/21/20 0932               Family Communication/Anticipated D/C date and plan/Code Status   DVT prophylaxis: SCDs Start: 01/21/20 1746 Place and maintain sequential compression device Start: 01/21/20 0704     Code Status: DNR  Family Communication: None Disposition Plan:    Status is: Inpatient  Remains inpatient appropriate because:Altered mental status and Unsafe d/c plan   Dispo: The patient is from: Group home              Anticipated d/c is to: SNF              Anticipated d/c date is: 1 day              Patient currently is not medically stable to d/c.           Subjective:   Interval events noted.  No complaints.   Pain in the right hip is better.  Objective:    Vitals:   01/22/20 1953 01/22/20 2350 01/23/20 0803 01/23/20 1048  BP:  (!) 157/76 131/64 (!) 104/55  Pulse:  66 (!) 109 96  Resp:  16 20 14   Temp:  99.1 F (37.3 C) 98.4 F (36.9 C) 98.3 F (36.8 C)  TempSrc:  Oral    SpO2: 98% (!) 84% (!) 89% 96%  Weight:      Height:       No data found.   Intake/Output Summary (Last 24 hours) at 01/23/2020 1340 Last data filed at 01/23/2020 0529 Gross per 24 hour  Intake 0 ml  Output 676 ml  Net -676 ml   Filed Weights   01/21/20 1513  Weight: 45.4 kg    Exam:  GEN: No acute distress SKIN: Warm and dry EYES: No pallor or icterus ENT: MMM CV: Regular rate and rhythm PULM: No wheezing or rales heard ABD: Soft, nontender CNS: Drowsy but arousable.  Oriented x3. EXT: Right hip surgical wound with dry dressing.  Some swelling noted on the right hip.   Data Reviewed:   I have personally reviewed following labs and imaging studies:  Labs: Labs show the following:   Basic Metabolic Panel: Recent Labs  Lab 01/21/20 0247 01/21/20 0247 01/21/20 0525 01/21/20 0525 01/22/20 0409 01/23/20 0258  NA 140  --  141  --  140 140  K 3.3*   < > 3.2*   < > 4.0 4.0  CL 99  --  98  --  100 100  CO2 32  --  36*  --  32 32  GLUCOSE 143*  --  145*  --  114* 105*  BUN 19  --  21  --  28* 25*  CREATININE 0.56  --  0.61  --  1.07* 0.80  CALCIUM 8.5*  --  8.8*  --  8.1* 8.3*  MG  --   --  1.7  --  1.6* 2.2   < > = values in this interval not displayed.   GFR Estimated Creatinine Clearance: 46.9 mL/min (by C-G formula based on SCr of 0.8 mg/dL). Liver Function Tests: Recent Labs  Lab 01/21/20 0247  AST 18  ALT 14  ALKPHOS 56  BILITOT 0.5  PROT 6.4*  ALBUMIN 3.8  No results for input(s): LIPASE, AMYLASE in the last 168 hours. No results for input(s): AMMONIA in the last 168 hours. Coagulation profile Recent Labs  Lab 01/21/20 0247  INR 0.9    CBC: Recent Labs  Lab  01/21/20 0247 01/21/20 0525 01/23/20 0258  WBC 12.4* 13.9* 9.1  NEUTROABS 8.5*  --  7.4  HGB 11.1* 10.7* 7.3*  HCT 34.6* 33.0* 22.9*  MCV 96.4 96.2 99.1  PLT 195 158 123*   Cardiac Enzymes: No results for input(s): CKTOTAL, CKMB, CKMBINDEX, TROPONINI in the last 168 hours. BNP (last 3 results) No results for input(s): PROBNP in the last 8760 hours. CBG: No results for input(s): GLUCAP in the last 168 hours. D-Dimer: No results for input(s): DDIMER in the last 72 hours. Hgb A1c: No results for input(s): HGBA1C in the last 72 hours. Lipid Profile: No results for input(s): CHOL, HDL, LDLCALC, TRIG, CHOLHDL, LDLDIRECT in the last 72 hours. Thyroid function studies: Recent Labs    01/21/20 0525  TSH 0.679   Anemia work up: No results for input(s): VITAMINB12, FOLATE, FERRITIN, TIBC, IRON, RETICCTPCT in the last 72 hours. Sepsis Labs: Recent Labs  Lab 01/21/20 0247 01/21/20 0525 01/23/20 0258  WBC 12.4* 13.9* 9.1    Microbiology Recent Results (from the past 240 hour(s))  Urine culture     Status: None   Collection Time: 01/21/20  3:29 AM   Specimen: Urine, Random  Result Value Ref Range Status   Specimen Description   Final    URINE, RANDOM Performed at [facility], [address] [place], [place] [address]    Special Requests   Final    NONE Performed at [facility], [address], [place], [place] [address]    Culture   Final    NO GROWTH Performed at [facility], [place] [address], [place],  [address]    Report Status 01/22/2020 FINAL  Final  Respiratory Panel by RT PCR (Flu A&B, Covid) -     Status: None   Collection Time: 01/21/20  3:29 AM  Result Value Ref Range Status   SARS Coronavirus 2 by RT PCR NEGATIVE NEGATIVE Final    Comment: (NOTE) SARS-CoV-2 target nucleic acids are NOT DETECTED.  The SARS-CoV-2 RNA is generally detectable in upper respiratoy specimens during the acute phase of infection. The  lowest concentration of SARS-CoV-2 viral copies this assay can detect is 131 copies/mL. A negative result does not preclude SARS-Cov-2 infection and should not be used as the sole basis for treatment or other patient management decisions. A negative result may occur with  improper specimen collection/handling, submission of specimen other than nasopharyngeal swab, presence of viral mutation(s) within the areas targeted by this assay, and inadequate number of viral copies (<131 copies/mL). A negative result must be combined with clinical observations, patient history, and epidemiological information. The expected result is Negative.  Fact Sheet for Patients:  PinkCheek.be  Fact Sheet for Healthcare Providers:  GravelBags.it  This test is no t yet approved or cleared by the [place] FDA and  has been authorized for detection and/or diagnosis of SARS-CoV-2 by FDA under an Emergency Use Authorization (EUA). This EUA will remain  in effect (meaning this test can be used) for the duration of the COVID-19 declaration under Section 564(b)(1) of the Act, 21 U.S.C. section 360bbb-3(b)(1), unless the authorization is terminated or revoked sooner.     Influenza A by PCR NEGATIVE NEGATIVE Final   Influenza B by PCR  NEGATIVE NEGATIVE Final    Comment: (NOTE) The Xpert Xpress SARS-CoV-2/FLU/RSV assay is intended as an aid in  the diagnosis of influenza from Nasopharyngeal swab specimens and  should not be used as a sole basis for treatment. Nasal washings and  aspirates are unacceptable for Xpert Xpress SARS-CoV-2/FLU/RSV  testing.  Fact Sheet for Patients: PinkCheek.be  Fact Sheet for Healthcare Providers: GravelBags.it  This test is not yet approved or cleared by the [place] FDA and  has been authorized for detection and/or diagnosis of SARS-CoV-2 by  FDA under  an Emergency Use Authorization (EUA). This EUA will remain  in effect (meaning this test can be used) for the duration of the  Covid-19 declaration under Section 564(b)(1) of the Act, 21  U.S.C. section 360bbb-3(b)(1), unless the authorization is  terminated or revoked. Performed at [facility], [address] [place], [place] [address]     Procedures and diagnostic studies:  DG HIP OPERATIVE UNILAT W OR W/O PELVIS RIGHT  Result Date: 01/21/2020 CLINICAL DATA:  Right hip fracture EXAM: OPERATIVE RIGHT HIP WITH PELVIS COMPARISON:  None. FLUOROSCOPY TIME:  Radiation Exposure Index (as provided by the fluoroscopic device): 0.7 mGy If the device does not provide the exposure index: Fluoroscopy Time:  1 second Number of Acquired Images:  1 FINDINGS: Right hip hemiarthroplasty is noted in satisfactory position. No acute soft tissue or bony abnormalities are noted. IMPRESSION: Status post right hip hemiarthroplasty Electronically Signed   By: Inez Catalina M.D.   On: 01/21/2020 18:02               LOS: 2 days   Isaura Schiller  [facility]   Pager on [email or web address]. If 7PM-7AM, please contact night-coverage at [email or web address]     01/23/2020, 1:40 PM

## 2020-01-23 NOTE — Progress Notes (Signed)
[facility] Liaison note:  Patient is currently followed by [facility] hosp[facility] services at [place] care home with a hospice diagnosis of emphysema. She is a DNR code with out of facility DNR in place in the home.  Patient was sent to the [facility] ED following a fall at her group home Hospice was not notified prior to transport. In the ED right hip xray revealed anacute angulated subcapital femoral neck fracture. This is a related admission per hospice physician Dr. Gilford Rile.   Patient is post op day 2  Patient seen sitting up on the recliner, appeared very anxious.Purwick catheter had been displaced and was lying in the chair. Assisted with repositioning patient with staff aide Lattie Haw.  Patient was able with the FULL assist of two to get up to the bedside commode, then assisted with FULL assistance of 2 back to the bed. She remained anxious, discussed with staff RN Lorenz Coaster.  Home medications reviewed. Attending physician Dr. Mal Misty notified and lorazepam changed to TID. Patient continues to require IV dilaudid for pain control.  Writer then assisted patient with her lunch, she continues with poor appetite, but did eat a bit more at lunch. Encouraged fluids.  Per chart note review and discussion with PT, the recommendations are for SNF for rehab. This will need to be discussed with her DSS guardian. TOC Misty Green aware. Will continue to follow and update hospice team.  VS: 98.3 oral, 104/55, 96, 14 I/O 149.3/ 676 IV/PRN medications IV Fluids d/cd today PRN IV Dilaudid 0.5 mg q 4 hrs PRN, liquid morphine concentrate  5 mg SL Q 4 hrs PRN, IV zofran 4mg  Q 4 PRN  No new imaging  Hospital problem list Principal Problem:   Closed fracture of neck of right femur (HCC) Active Problems:   COPD (chronic obstructive pulmonary disease) (HCC)   Depression with anxiety   S/P right hip fracture   AKI (acute kidney injury) ([place]) Altered mental status/acute toxic  encephalopathy Acute kidney injury Acute blood loss anemia   Body mass index is 18.29 kg/m.    PLAN  Patient is status post right hip hemiarthroplasty 01/21/2020. No indication for blood transfusion at this time.  Monitor H&H. Continue analgesics as needed for pain Discontinue IV fluids since creatinine has improved. Continue bronchodilators for COPD Continue 4 L/min oxygen via nasal cannula for chronic hypoxemic respiratory failure. Continue psychotropics for depression and anxiety Awaiting placement to SNF for short-term rehab versus home with hospice.  Follow-up with social worker to assist with disposition.  IDG: updated Discharge planning: Liaison and hospital TOC to work with DSS [place] regarding dsicahrge plan. PT is recommending short term rehab. Goals of Care: DNR  Flo Shanks BSN, RN, [place] [phone]

## 2020-01-23 NOTE — Progress Notes (Signed)
Physical Therapy Treatment Patient Details Name: Melissa George MRN: [id number] DOB: 1950/06/01 Today's Date: 01/23/2020    History of Present Illness Melissa George is a 69 y/o female who underwent R anterior hip hemiarthroplasty after sustaining a mechanical fall. 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    PT Comments    Pt lying in bed upon arrival to room, with HOB at highest elevation, and reports feeling a little bit better than yesterday. Pt states throughout treatment that she feels itchy on her back and wonders if it might be morphine that is causing it. Pt performed therex in bed and required verbal cues for correct technique. SpO2 reading at 98%, 106 HR with bed therex. Education packet given and reviewed with pt. Pt then performed supine to sit with max A for truncal elevation, BLE advancement over edge of bed, and scooting hips forward. Pt required increased time to perform. Noted increased work of breathing once sitting edge of bed. O2 sats at 95% with HR at 115 bpm. Pt required mod A for sitting balance secondary to posterior lean. Pt was able to progress to min A with BUE support on bedrails and verbal cues for anterior lean. Pt required max+2 A for sit <> stand transfer for boosting to stand, upright balance, weight shifting, and eccentric control on descent to chair. Fatigue is a limiting factor. O2 sats at 98% with 111HR after transfer. Pt remained on 4L O2 throughout session. Pt left positioned for comfort. Pt with poor tolerance to activity therefore changing frequency to daily vs BID. POC remains appropriate for STR at discharge secondary to decreased functional mobility and increased level of assistance required.     Follow Up Recommendations  SNF;Supervision for mobility/OOB     Equipment Recommendations  Rolling walker with 5" wheels;3in1 (PT)    Recommendations for Other Services       Precautions /  Restrictions Precautions Precautions: Fall Restrictions Weight Bearing Restrictions: Yes RLE Weight Bearing: Weight bearing as tolerated    Mobility  Bed Mobility Overal bed mobility: Needs Assistance Bed Mobility: Supine to Sit     Supine to sit: Max assist     General bed mobility comments: MAx A for supine to sit for scooting hips to edge of bed and for truncal elevation  Transfers Overall transfer level: Needs assistance Equipment used: None Transfers: Sit to/from Stand Sit to Stand: Max assist;+2 physical assistance         General transfer comment: Max+2 A for boosting to stand and for eccentric control on descent  Ambulation/Gait Ambulation/Gait assistance: Max assist;+2 physical assistance Gait Distance (Feet): 1 Feet Assistive device: None   Gait velocity: decreased   General Gait Details: Max+2 A for weightshifting and remaining upright   Stairs             Wheelchair Mobility    Modified Rankin (Stroke Patients Only)       Balance Overall balance assessment: Needs assistance Sitting-balance support: Bilateral upper extremity supported;Feet supported Sitting balance-Leahy Scale: Fair Sitting balance - Comments: mod A required initially then progressed to min A with BUE/BLE support Postural control: Posterior lean Standing balance support: Bilateral upper extremity supported;During functional activity Standing balance-Leahy Scale: Poor Standing balance comment: max+2 A to remain upright                            Cognition Arousal/Alertness: Awake/alert Behavior During Therapy: [facility] for tasks  assessed/performed;Flat affect Overall Cognitive Status: Within Functional Limits for tasks assessed                                        Exercises Other Exercises Other Exercises: RLE AP, QS, GS, SAQ x 12 reps    General Comments        Pertinent Vitals/Pain Pain Assessment: 0-10 Pain Score: 6  Pain Location:  R hip Pain Descriptors / Indicators: Aching;Discomfort;Operative site guarding;Sore Pain Intervention(s): Monitored during session;Repositioned;Limited activity within patient's tolerance    Home Living                      Prior Function            PT Goals (current goals can now be found in the care plan section) Acute Rehab PT Goals Patient Stated Goal: none stated PT Goal Formulation: With patient Time For Goal Achievement: 02/05/20 Potential to Achieve Goals: Fair Progress towards PT goals: Progressing toward goals    Frequency    7X/week      PT Plan Current plan remains appropriate    Co-evaluation              AM-PAC PT "6 Clicks" Mobility   Outcome Measure  Help needed turning from your back to your side while in a flat bed without using bedrails?: A Lot Help needed moving from lying on your back to sitting on the side of a flat bed without using bedrails?: A Lot Help needed moving to and from a bed to a chair (including a wheelchair)?: A Lot Help needed standing up from a chair using your arms (e.g., wheelchair or bedside chair)?: A Lot Help needed to walk in hospital room?: A Lot Help needed climbing 3-5 steps with a railing? : Total 6 Click Score: 11    End of Session Equipment Utilized During Treatment: Gait belt;Oxygen (4L) Activity Tolerance: Patient limited by fatigue Patient left: in chair;with call bell/phone within reach;with chair alarm set Nurse Communication: Mobility status PT Visit Diagnosis: Unsteadiness on feet (R26.81);Other abnormalities of gait and mobility (R26.89);Muscle weakness (generalized) (M62.81);Repeated falls (R29.6);History of falling (Z91.81);Pain Pain - Right/Left: Right Pain - part of body: Hip     Time: 1001-1039 PT Time Calculation (min) (ACUTE ONLY): 38 min  Charges:                       Vale Haven, SPT   Davidjames Blansett 01/23/2020, 1:28 PM

## 2020-01-24 ENCOUNTER — Inpatient Hospital Stay

## 2020-01-24 DIAGNOSIS — J9622 Acute and chronic respiratory failure with hypercapnia: Secondary | ICD-10-CM

## 2020-01-24 LAB — BLOOD GAS, ARTERIAL
Acid-Base Excess: 10.1 mmol/L — ABNORMAL HIGH (ref 0.0–2.0)
Bicarbonate: 36.3 mmol/L — ABNORMAL HIGH (ref 20.0–28.0)
FIO2: 0.4
O2 Saturation: 96.5 %
Patient temperature: 37
pCO2 arterial: 60 mmHg — ABNORMAL HIGH (ref 32.0–48.0)
pH, Arterial: 7.39 (ref 7.350–7.450)
pO2, Arterial: 87 mmHg (ref 83.0–108.0)

## 2020-01-24 LAB — BASIC METABOLIC PANEL
Anion gap: 11 (ref 5–15)
BUN: 18 mg/dL (ref 8–23)
CO2: 32 mmol/L (ref 22–32)
Calcium: 8.5 mg/dL — ABNORMAL LOW (ref 8.9–10.3)
Chloride: 99 mmol/L (ref 98–111)
Creatinine, Ser: 0.6 mg/dL (ref 0.44–1.00)
GFR, Estimated: >60 mL/min (ref >60)
Glucose, Bld: 133 mg/dL — ABNORMAL HIGH (ref 70–99)
Potassium: 3.8 mmol/L (ref 3.5–5.1)
Sodium: 142 mmol/L (ref 135–145)

## 2020-01-24 LAB — URINALYSIS, COMPLETE (UACMP) WITH MICROSCOPIC
Bacteria, UA: NONE SEEN
Bilirubin Urine: NEGATIVE
Glucose, UA: NEGATIVE mg/dL
Ketones, ur: NEGATIVE mg/dL
Leukocytes,Ua: NEGATIVE
Nitrite: NEGATIVE
Protein, ur: NEGATIVE mg/dL
Specific Gravity, Urine: 1.004 — ABNORMAL LOW (ref 1.005–1.030)
Squamous Epithelial / HPF: NONE SEEN (ref 0–5)
pH: 6 (ref 5.0–8.0)

## 2020-01-24 LAB — CBC WITH DIFFERENTIAL/PLATELET
Abs Immature Granulocytes: 0.08 10*3/uL — ABNORMAL HIGH (ref 0.00–0.07)
Basophils Absolute: 0 10*3/uL (ref 0.0–0.1)
Basophils Relative: 0 %
Eosinophils Absolute: 0 10*3/uL (ref 0.0–0.5)
Eosinophils Relative: 0 %
HCT: 23 % — ABNORMAL LOW (ref 36.0–46.0)
Hemoglobin: 7.2 g/dL — ABNORMAL LOW (ref 12.0–15.0)
Immature Granulocytes: 1 %
Lymphocytes Relative: 5 %
Lymphs Abs: 0.5 10*3/uL — ABNORMAL LOW (ref 0.7–4.0)
MCH: 31 pg (ref 26.0–34.0)
MCHC: 31.3 g/dL (ref 30.0–36.0)
MCV: 99.1 fL (ref 80.0–100.0)
Monocytes Absolute: 0.8 10*3/uL (ref 0.1–1.0)
Monocytes Relative: 8 %
Neutro Abs: 8.2 10*3/uL — ABNORMAL HIGH (ref 1.7–7.7)
Neutrophils Relative %: 86 %
Platelets: 128 10*3/uL — ABNORMAL LOW (ref 150–400)
RBC: 2.32 MIL/uL — ABNORMAL LOW (ref 3.87–5.11)
RDW: 12.5 % (ref 11.5–15.5)
WBC: 9.7 10*3/uL (ref 4.0–10.5)
nRBC: 0 % (ref 0.0–0.2)

## 2020-01-24 LAB — BLOOD GAS, VENOUS

## 2020-01-24 LAB — PREPARE RBC (CROSSMATCH)

## 2020-01-24 MED ORDER — LEVALBUTEROL HCL 1.25 MG/0.5ML IN NEBU
INHALATION_SOLUTION | RESPIRATORY_TRACT | Status: AC
  Administered 2020-01-24: 1.25 mg
  Filled 2020-01-24: qty 0.5

## 2020-01-24 MED ORDER — FUROSEMIDE 10 MG/ML IJ SOLN
INTRAMUSCULAR | Status: AC
  Administered 2020-01-24: 40 mg
  Filled 2020-01-24: qty 4

## 2020-01-24 MED ORDER — IPRATROPIUM BROMIDE 0.02 % IN SOLN
0.5000 mg | Freq: Four times a day (QID) | RESPIRATORY_TRACT | Status: DC
  Administered 2020-01-24 – 2020-01-25 (×5): 0.5 mg via RESPIRATORY_TRACT
  Filled 2020-01-24 (×5): qty 2.5

## 2020-01-24 MED ORDER — LEVALBUTEROL HCL 0.63 MG/3ML IN NEBU
0.6300 mg | INHALATION_SOLUTION | Freq: Four times a day (QID) | RESPIRATORY_TRACT | Status: DC | PRN
  Filled 2020-01-24: qty 3

## 2020-01-24 MED ORDER — ALBUTEROL SULFATE (2.5 MG/3ML) 0.083% IN NEBU
INHALATION_SOLUTION | RESPIRATORY_TRACT | Status: AC
  Administered 2020-01-24: 2.5 mg
  Filled 2020-01-24: qty 3

## 2020-01-24 MED ORDER — ALBUTEROL SULFATE (2.5 MG/3ML) 0.083% IN NEBU: 2.5000 mg | INHALATION_SOLUTION | RESPIRATORY_TRACT | Status: DC | PRN

## 2020-01-24 MED ORDER — METOPROLOL TARTRATE 5 MG/5ML IV SOLN
2.5000 mg | Freq: Once | INTRAVENOUS | Status: AC
  Administered 2020-01-24: 2.5 mg via INTRAVENOUS
  Filled 2020-01-24: qty 5

## 2020-01-24 MED ORDER — IPRATROPIUM-ALBUTEROL 0.5-2.5 (3) MG/3ML IN SOLN
RESPIRATORY_TRACT | Status: AC
  Filled 2020-01-24: qty 3

## 2020-01-24 MED ORDER — METHYLPREDNISOLONE SODIUM SUCC 125 MG IJ SOLR
INTRAMUSCULAR | Status: AC
  Filled 2020-01-24: qty 2

## 2020-01-24 MED ORDER — LEVALBUTEROL HCL 0.63 MG/3ML IN NEBU
0.6300 mg | INHALATION_SOLUTION | Freq: Four times a day (QID) | RESPIRATORY_TRACT | Status: DC
  Administered 2020-01-24 – 2020-01-25 (×5): 0.63 mg via RESPIRATORY_TRACT
  Filled 2020-01-24 (×8): qty 3

## 2020-01-24 MED ORDER — METHYLPREDNISOLONE SODIUM SUCC 125 MG IJ SOLR
60.0000 mg | Freq: Once | INTRAMUSCULAR | Status: AC
  Administered 2020-01-24: 60 mg via INTRAVENOUS

## 2020-01-24 MED ORDER — SODIUM CHLORIDE 0.9% IV SOLUTION: Freq: Once | INTRAVENOUS | Status: DC

## 2020-01-24 MED ORDER — IPRATROPIUM-ALBUTEROL 0.5-2.5 (3) MG/3ML IN SOLN
3.0000 mL | RESPIRATORY_TRACT | Status: DC | PRN
  Administered 2020-01-24: 3 mL via RESPIRATORY_TRACT

## 2020-01-24 MED ORDER — SODIUM CHLORIDE 0.9 % IV BOLUS
500.0000 mL | Freq: Once | INTRAVENOUS | Status: AC
  Administered 2020-01-24: 500 mL via INTRAVENOUS

## 2020-01-24 MED ORDER — SODIUM CHLORIDE 0.9 % IV SOLN: INTRAVENOUS | Status: DC

## 2020-01-24 NOTE — Consult Note (Signed)
[place] for Electrolyte Monitoring and Replacement   Recent Labs: Potassium (mmol/L)  Date Value  01/24/2020 3.8  03/02/2014 3.3 (L)   Magnesium (mg/dL)  Date Value  01/23/2020 2.2  01/23/2014 2.2   Calcium (mg/dL)  Date Value  01/24/2020 8.5 (L)   Calcium, Total (mg/dL)  Date Value  03/02/2014 8.8   Albumin (g/dL)  Date Value  01/21/2020 3.8  03/02/2014 3.6   Phosphorus (mg/dL)  Date Value  10/07/2014 3.4   Sodium (mmol/L)  Date Value  01/24/2020 142  03/02/2014 141    Assessment: Patient is a 69 y/o F with medical history including COPD, CAD, anxiety who presented to the ED 10/12 with R hip pain after mechanical fall. Patient found to have R hip fracture. Ortho consulted and plan is for R hip hemiarthroplasty 10/12.   MIVF: NaCl at 125 mL/hr  Goal of Therapy:  Electrolytes within normal limits  Plan:  --No replenishment warranted today --Re-check electrolytes with AM labs and continue to monitor.  Pernell Dupre, PharmD, BCPS Clinical Pharmacist 01/24/2020 7:49 AM

## 2020-01-24 NOTE — Progress Notes (Signed)
Subjective:  Patient reports pain as mild to moderate.    Objective:   VITALS:   Vitals:   01/24/20 0136 01/24/20 0355 01/24/20 0409 01/24/20 0413  BP: 130/66 119/61 (!) 98/55 (!) 92/52  Pulse: (!) 137 (!) 130 (!) 109 (!) 108  Resp: (!) 24 20 20 20   Temp:   99.2 F (37.3 C) 99.2 F (37.3 C)  TempSrc:   Oral Oral  SpO2: 92% 93% 93% 97%  Weight:      Height:        PHYSICAL EXAM:  Neurologically intact ABD soft Neurovascular intact Sensation intact distally Intact pulses distally Dorsiflexion/Plantar flexion intact Incision: scant drainage No cellulitis present Compartment soft  LABS  Results for orders placed or performed during the hospital encounter of 01/21/20 (from the past 24 hour(s))  Blood gas, arterial     Status: Abnormal   Collection Time: 01/24/20 12:48 AM  Result Value Ref Range   FIO2 0.40    Delivery systems NASAL CANNULA    pH, Arterial 7.39 7.35 - 7.45   pCO2 arterial 60 (H) 32 - 48 mmHg   pO2, Arterial 87 83 - 108 mmHg   Bicarbonate 36.3 (H) 20.0 - 28.0 mmol/L   Acid-Base Excess 10.1 (H) 0.0 - 2.0 mmol/L   O2 Saturation 96.5 %   Patient temperature 37.0    Collection site LEFT RADIAL    Sample type ARTERIAL DRAW    Allens test (pass/fail) PASS PASS  Basic metabolic panel     Status: Abnormal   Collection Time: 01/24/20  2:52 AM  Result Value Ref Range   Sodium 142 135 - 145 mmol/L   Potassium 3.8 3.5 - 5.1 mmol/L   Chloride 99 98 - 111 mmol/L   CO2 32 22 - 32 mmol/L   Glucose, Bld 133 (H) 70 - 99 mg/dL   BUN 18 8 - 23 mg/dL   Creatinine, Ser 0.60 0.44 - 1.00 mg/dL   Calcium 8.5 (L) 8.9 - 10.3 mg/dL   GFR, Estimated >60 >60 mL/min   Anion gap 11 5 - 15  CBC with Differential/Platelet     Status: Abnormal   Collection Time: 01/24/20  2:52 AM  Result Value Ref Range   WBC 9.7 4.0 - 10.5 K/uL   RBC 2.32 (L) 3.87 - 5.11 MIL/uL   Hemoglobin 7.2 (L) 12.0 - 15.0 g/dL   HCT 23.0 (L) 36 - 46 %   MCV 99.1 80.0 - 100.0 fL   MCH 31.0 26.0 -  34.0 pg   MCHC 31.3 30.0 - 36.0 g/dL   RDW 12.5 11.5 - 15.5 %   Platelets 128 (L) 150 - 400 K/uL   nRBC 0.0 0.0 - 0.2 %   Neutrophils Relative % 86 %   Neutro Abs 8.2 (H) 1.7 - 7.7 K/uL   Lymphocytes Relative 5 %   Lymphs Abs 0.5 (L) 0.7 - 4.0 K/uL   Monocytes Relative 8 %   Monocytes Absolute 0.8 0.1 - 1.0 K/uL   Eosinophils Relative 0 %   Eosinophils Absolute 0.0 0.0 - 0.5 K/uL   Basophils Relative 0 %   Basophils Absolute 0.0 0.0 - 0.1 K/uL   Immature Granulocytes 1 %   Abs Immature Granulocytes 0.08 (H) 0.00 - 0.07 K/uL    DG Chest Port 1 View  Result Date: 01/24/2020 CLINICAL DATA:  Shortness of breath EXAM: PORTABLE CHEST 1 VIEW COMPARISON:  01/21/2020 FINDINGS: Left upper lobe pulmonary nodule is less clearly visualized on this study  due to overlapping structures. Right apical nodule is unchanged. The lungs are hyperinflated with diffuse interstitial prominence. No pleural effusion or pneumothorax. No lobar consolidation IMPRESSION: 1. COPD without acute airspace disease. 2. Unchanged right apical nodule. 3. Left upper lobe nodule less clearly seen on this study. Electronically Signed   By: Ulyses Jarred M.D.   On: 01/24/2020 01:49    Assessment/Plan: 3 Days Post-Op   Principal Problem:   Closed fracture of neck of right femur (HCC) Active Problems:   COPD (chronic obstructive pulmonary disease) (HCC)   Depression with anxiety   S/P right hip fracture   AKI (acute kidney injury) ([place])   Advance diet Up with therapy WBAT RLE Continue pain control Hemoglobin 7.2  Continue to hold Lovenox Follow with Dr. Harlow Mares office in 2 weeks for stable removal, call office for appt. [phone], Estimate follow up on 12/05/19  Patient is already enrolled in hospice. Follow-up with hospice team and social worker for disposition.    Carlynn Spry , PA-C 01/24/2020, 8:19 AM

## 2020-01-24 NOTE — Progress Notes (Signed)
Ch arrived at room in response to RR. RN let Ch know that Pt had some SOB. Ch checked if any family was around. RN said that Pt's son has been checking up on her via phone, but no family present at this time.

## 2020-01-24 NOTE — TOC Progression Note (Signed)
Transition of Care [facility]) - Progression Note    Patient Details  Name: Melissa George MRN: [id number] Date of Birth: [birth year]  Transition of Care [facility]) CM/SW [place], RN Phone Number: 01/24/2020, 8:33 AM  Clinical Narrative:   RNCM left VM for Elaina Hoops to gain approval for SNF bed search if that is there wishes.          Expected Discharge Plan and Services                                                 Social Determinants of Health (SDOH) Interventions    Readmission Risk Interventions No flowsheet data found.

## 2020-01-24 NOTE — Progress Notes (Signed)
Rapid Response Event Note   Reason for Call : Change in mental status and heart rate.    Initial Focused Assessment:   Pt in bed O2 in place, audible wheezing. RN states she became less alert and BP 16/90. Pt is tachycardic to 120s to 130s. Will open eyes and answer some questions.     Interventions: RR order put in for albuterol neb and 12 lead ekg. Dr. Damita Dunnings arrived at bedside and will issue further orders if needed.   Plan of Care: Remain in room for  Now, will see if interventions are successful    Event Summary:  Pt left with MD at bedside MD Notified: Damita Dunnings at [facility] Call [place] A, RN

## 2020-01-24 NOTE — Progress Notes (Signed)
Pt began having sob - satying help me over and over - c/o nausea - vs taken and sats dropped, bp elevated and pulse >150 - MD sent message via chat and also paged-rapid response team arrived in room

## 2020-01-24 NOTE — Progress Notes (Signed)
Interval events noted.  Rapid response was called in the early hours of the morning because of change in mental status, tachycardia and worsening hypoxemia.  She is tachycardic and hypoxemic requiring 5 L/min oxygen via nasal cannula.  Diagnostic data including lab work and chest x-ray reviewed.  No acute abnormality noted on chest x-ray.  Hemoglobin is down to 7.2 from 11.1 on admission..  Patient will be transfused with 1 unit of packed red blood cells because of acute blood loss anemia.  Case was discussed with Ms. Lollie Sails with DSS.  Risks and benefits of blood transfusion were discussed and she consented to blood transfusion.  Disposition was also discussed.  She is yet to decide whether patient should go to SNF for short-term rehab or go back to the group home with hospice.  She said she will discuss this with her team.      LOS: 3 days      Sheniya Garciaperez  [facility]   Pager on [email or web address]. If 7PM-7AM, please contact night-coverage at [email or web address]     01/24/2020, 2:11 PM

## 2020-01-24 NOTE — Progress Notes (Signed)
[facility] Liaison note:  Patient is currently followed by [facility] hosp[facility] services at [place] care home with a hospice diagnosis of emphysema. She is a DNR code with out of facility DNR in place in the home.  Patient was sent to the [facility] ED followingafall at her group homeHospice was not notified prior to transport. In the ED right hip xray revealed anacute angulated subcapital femoral neck fracture. This is a related admission per hospice physician Dr. Gilford Rile.  Patient is post op day 3  Checked on patient several times today. She was sleeping each time.  Chart notes reviewed and report exchanged with staff RN Gerald Stabs. A rapid response was called overnight for a change in MEWS score with tachycardia, decreased oxygen saturations and wheezing. Her hemoglobin was 7.2 this morning and plan is for a 1 unit of PRBC to be given. Appetite remains poor. Call placed to patient's DSS guardian Nigel Sloop, awaiting call back. Patient was able to do some bed exercises today with PT. The recommendation continues to be for SNF short term rehab. Will continue to follow and assist with discharge planning and up date hospice team.  VS: 98.3 oral, 103/55, 89, 16, 98% on 4 liters IV/ PRN Meds: 1 unit of PRBC, IV Dilaudid 0.5 mg Q 4 hrs, Duoneb 3 ml Q 4 hrs PRN wheezing, Morphine concentrate 5 mg Q 4 hrs PRN pain/dyspnea  Abnormal labs: 01/24/2020 32:44 BASIC METABOLIC PANEL: Rpt (A) Glucose: 133 (H) Calcium: 8.5 (L) WBC: 9.7 RBC: 2.32 (L) Hemoglobin: 7.2 (L) HCT: 23.0 (L) Platelets: 128 (L) Lymphocyte #: 0.5 (L) Abs Immature Granulocytes: 0.08 (H)  Imaging: CLINICAL DATA:  Shortness of breath EXAM: PORTABLE CHEST 1 VIEW COMPARISON:  01/21/2020 IMPRESSION: 1. COPD without acute airspace disease. 2. Unchanged right apical nodule. 3. Left upper lobe nodule less clearly seen on this study.  Hospital Problem list:  Principal Problem:    Closed fracture of neck of right femur ([place]) Active Problems:   COPD (chronic obstructive pulmonary disease) (HCC)   Depression with anxiety   S/P right hip fracture   AKI (acute kidney injury) ([place]) Altered mental status/acute toxic encephalopathy Acute kidney injury Acute blood loss anemia  Body mass index is 18.29 kg/m.   PLAN  Patient is status post right hip hemiarthroplasty 01/21/2020. No indication for blood transfusion at this time.  Monitor H&H. Continue analgesics as needed for pain Discontinue IV fluids since creatinine has improved. Continue bronchodilators for COPD Continue 4 L/min oxygen via nasal cannula for chronic hypoxemic respiratory failure. Continue psychotropics for depression and anxiety Awaiting placement to SNF for short-term rehab versus home with hospice.  Follow-up with social worker to assist with disposition.  Interval events noted.  Rapid response was called in the early hours of the morning because of change in mental status, tachycardia and worsening hypoxemia.  She is tachycardic and hypoxemic requiring 5 L/min oxygen via nasal cannula.  Diagnostic data including lab work and chest x-ray reviewed.  No acute abnormality noted on chest x-ray.  Hemoglobin is down to 7.2 from 11.1 on admission..  Patient will be transfused with 1 unit of packed red blood cells because of acute blood loss anemia.  Case was discussed with Ms. Lollie Sails with DSS.  Risks and benefits of blood transfusion were discussed and she consented to blood transfusion.  Disposition was also discussed.  She is yet to decide whether patient should go to SNF for short-term rehab or go back to the group  home with hospice.  She said she will discuss this with her team.  Discharge Planning: On going Family Contact: message left for DSS guardian IDG: updated Goals of care: DNR  Flo Shanks BSN, RN, [place] [phone]

## 2020-01-24 NOTE — Progress Notes (Signed)
   01/23/20 2338  Assess: MEWS Score  Temp 97.7 F (36.5 C)  BP (!) 160/66  Pulse Rate (!) 136  Resp 20  SpO2 (!) 77 %  O2 Device Nasal Cannula  O2 Flow Rate (L/min) 4 L/min  Assess: MEWS Score  MEWS Temp 0  MEWS Systolic 0  MEWS Pulse 3  MEWS RR 0  MEWS LOC 0  MEWS Score 3  MEWS Score Color Yellow  Assess: if the MEWS score is Yellow or Red  Were vital signs taken at a resting state? Yes  Focused Assessment Change from prior assessment (see assessment flowsheet)  Early Detection of Sepsis Score *See Row Information* Low  MEWS guidelines implemented *See Row Information* Yes  Treat  MEWS Interventions Escalated (See documentation below)  Take Vital Signs  Increase Vital Sign Frequency  Yellow: Q 2hr X 2 then Q 4hr X 2, if remains yellow, continue Q 4hrs  Escalate  MEWS: Escalate Yellow: discuss with charge nurse/RN and consider discussing with provider and RRT  Notify: Charge Nurse/RN  Name of Charge Nurse/RN Notified Kasey RN  Date Charge Nurse/RN Notified 01/23/20  Time Charge Nurse/RN Notified 2345  Notify: Provider  Provider Name/Title Judd Gaudier MD  Date Provider Notified 01/23/20  Time Provider Notified 2340  Notification Type  (secure chat )  Notification Reason Change in status  Response See new orders  Date of Provider Response 01/23/20  Time of Provider Response 0009  Notify: Rapid Response  Name of Rapid Response RN Notified Beth RN  Date Rapid Response Notified 01/24/20  Time Rapid Response Notified 0000

## 2020-01-24 NOTE — Progress Notes (Signed)
Physical Therapy Treatment Patient Details Name: Melissa George MRN: [id number] DOB: 07/12/1950 Today's Date: 01/24/2020    History of Present Illness Melissa George is a 69 y/o female who underwent R anterior hip hemiarthroplasty after sustaining a mechanical fall. 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    PT Comments    Pt received slouched down in bed with food tray in front of her. Pt requesting assistance to eat because she stated she is unable to feed herself and said that she needs to sit up higher in bed. NT notified about feeding assistance who reported that she was currently feeding another patient and would come when she was done with that. Pt required total+2 A to slide to [facility] via drawsheet. Once pt in better position, pt performed APs bilaterally x 10 then she reported that she needed to have a BM and requested to be placed on a bedpan. HOB lowered and pt instructed to reach across body with RUE first for clinician's hand then assisted to bedrail. Pillow placed between legs and pt instructed to bend L knee. Mod+2 A for rotating into sidelying position and back after placing bedpan underneath. HOB raised to simulate sitting on commode. Pt able to shift shoulders to L for improved alignment in bed and able to pull on clinician's hand to come into long sitting for pillow placement behind shoulders. Pt noted to mumble then stated "the Uvaldo Bristle is going to come tonight". When asked, pt stated that she had not had any bowel movement yet. Nurse tech arrived and agreeable to get pt off of bedpan then assist her with eating. Pt left on bedpan with nurse tech in room. Pt remained on 4L O2 throughout session. Pt's face looked a little brighter today and vocalizations presented with more volume however pt continues to be limited secondary to generalized weakness and decreased functional activity tolerance. SNF recommendation remains  appropriate.    Follow Up Recommendations  SNF;Supervision for mobility/OOB     Equipment Recommendations  Rolling walker with 5" wheels;3in1 (PT)    Recommendations for Other Services       Precautions / Restrictions Precautions Precautions: Fall Restrictions Weight Bearing Restrictions: Yes RLE Weight Bearing: Weight bearing as tolerated    Mobility  Bed Mobility Overal bed mobility: Needs Assistance Bed Mobility: Rolling Rolling: Mod assist;+2 for physical assistance         General bed mobility comments: Total+2 A for scooting to [place] via drawsheet; mod+2 A for rolling to L to be placed on bedpan  Transfers                 General transfer comment: not performed today  Ambulation/Gait             General Gait Details: not performed   Stairs             Wheelchair Mobility    Modified Rankin (Stroke Patients Only)       Balance   Sitting-balance support: Single extremity supported Sitting balance-Leahy Scale: Fair Sitting balance - Comments: pt pulled herself on clincian's hand to come into longsitting from elevated HOB       Standing balance comment: not performed                             Cognition Arousal/Alertness: Awake/alert Behavior During Therapy: WFL for tasks assessed/performed;Flat affect Overall Cognitive Status: Within Functional Limits for tasks assessed  General Comments: Pt stated "a robber is going to come here tonight."      Exercises Other Exercises Other Exercises: AP x 10 bialterally    General Comments        Pertinent Vitals/Pain Pain Assessment: Faces Faces Pain Scale: Hurts even more Pain Location: back and L heel Pain Descriptors / Indicators: Discomfort;Grimacing;Guarding;Sore Pain Intervention(s): Monitored during session;Repositioned;Limited activity within patient's tolerance    Home Living                      Prior  Function            PT Goals (current goals can now be found in the care plan section) Acute Rehab PT Goals PT Goal Formulation: With patient Time For Goal Achievement: 02/05/20 Potential to Achieve Goals: Fair Progress towards PT goals: Progressing toward goals    Frequency    7X/week      PT Plan Current plan remains appropriate    Co-evaluation              AM-PAC PT "6 Clicks" Mobility   Outcome Measure  Help needed turning from your back to your side while in a flat bed without using bedrails?: A Lot Help needed moving from lying on your back to sitting on the side of a flat bed without using bedrails?: A Lot Help needed moving to and from a bed to a chair (including a wheelchair)?: A Lot Help needed standing up from a chair using your arms (e.g., wheelchair or bedside chair)?: A Lot Help needed to walk in hospital room?: Total Help needed climbing 3-5 steps with a railing? : Total 6 Click Score: 10    End of Session Equipment Utilized During Treatment: Oxygen Activity Tolerance: Patient limited by lethargy;Patient limited by fatigue Patient left: in bed;with nursing/sitter in room;Other (comment) (on bedpan) Nurse Communication: Mobility status PT Visit Diagnosis: Unsteadiness on feet (R26.81);Other abnormalities of gait and mobility (R26.89);Muscle weakness (generalized) (M62.81);Repeated falls (R29.6);History of falling (Z91.81);Pain Pain - part of body:  (back and L foot)     Time: 1000-1025 PT Time Calculation (min) (ACUTE ONLY): 25 min  Charges:                        Vale Haven, SPT   Vale Haven 01/24/2020, 12:10 PM

## 2020-01-24 NOTE — Significant Event (Addendum)
Rapid Response Event Note   Reason for Call : called RR for possible transfusion reaction   Initial Focused Assessment: pt laying in bed, pod 3 hip fx, just started unit prbc's, alert, anxious, tachycardic, SOB.Marland KitchenMarland Kitchen      Interventions: Dr Nevada Crane to bedside, blood bank notified, blood stopped, iv removed, new iv placed, reaction labs ordered, lasix given, vbg, steroids, and breathing treatment ordered. Pt's VSS... pt back to 4L [place] which is what she wears at home for COPD.   Plan of Care: RN Gerald Stabs, and charge RN Anderson Malta to call if further assistance needed.    Event Summary:   MD Notified: Nevada Crane Call Time: 1805 Arrival Time: [place] A, RN

## 2020-01-24 NOTE — Progress Notes (Signed)
Rapid response PROGRESS NOTE    Melissa George  ENI:[id number] DOB: 12/26/1950 DOA: 01/21/2020 PCP: Remi Haggard, FNP  Brief Narrative:  Patient admitted from hospice with hip fracture Rapid response called due to decreased alertness from prior as well as persistent tachycardia in the 130s  Assessment & Plan:  Altered mental status/acute metabolic encephalopathy COPD exacerbation Acute on chronic hypercapnic respiratory failure -Likely secondary to worsening of chronic hypercapnic respiratory failure -Patient is on 5 L O2 up from home flow rate of 4 L -ABG on 5 L showing PCO2 of 60 with normal pH -Chest x-ray ordered and reviewed -Continue O2 at 5 for now but can transition to BiPAP to assess for therapeutic benefit -DuoNebs stat  Sinus tachycardia -Suspect related to acute condition above as well as possible mild dehydration as well as postoperative anemia -Hemoglobin 7.3, down 3 points from 10 the day prior -CBC ordered and results reviewed.  Hemoglobin about the same at 7.2 -IV hydration to assess for benefit -One-time low-dose metoprolol if no improvement with hydration      Principal Problem:   Closed fracture of neck of right femur (HCC) Active Problems:   COPD (chronic obstructive pulmonary disease) (HCC)   Depression with anxiety   S/P right hip fracture   AKI (acute kidney injury) ([place])  .    Objective: Vitals:   01/23/20 2338 01/23/20 2355 01/24/20 0105 01/24/20 0136  BP: (!) 160/66 (!) 166/80  130/66  Pulse: (!) 136   (!) 137  Resp: 20   (!) 24  Temp: 97.7 F (36.5 C)     TempSrc: Oral     SpO2: (!) 77% 100% 100% 92%  Weight:      Height:        Intake/Output Summary (Last 24 hours) at 01/24/2020 0308 Last data filed at 01/23/2020 6144 Gross per 24 hour  Intake --  Output 200 ml  Net -200 ml   Filed Weights   01/21/20 1513  Weight: 45.4 kg    Examination:  General exam: Patient appears pale, lethargic, arousable with shaking   Respiratory system: Tachypneic with increased work of breathing use of accessory muscles, audible wheezing, rhonchi throughout both lung fields Cardiovascular system: Tachycardic.  No murmurs. Gastrointestinal system: Abdomen is nondistended, soft and nontender. No organomegaly or masses felt. Normal bowel sounds heard. Central nervous system: Alert and oriented. No focal neurological deficits. Extremities: Symmetric 5 x 5 power. Skin: No rashes, lesions or ulcers     Data Reviewed: I have personally reviewed following labs and imaging studies  CBC: Recent Labs  Lab 01/21/20 0247 01/21/20 0525 01/23/20 0258 01/24/20 0252  WBC 12.4* 13.9* 9.1 9.7  NEUTROABS 8.5*  --  7.4 8.2*  HGB 11.1* 10.7* 7.3* 7.2*  HCT 34.6* 33.0* 22.9* 23.0*  MCV 96.4 96.2 99.1 99.1  PLT 195 158 123* 315*   Basic Metabolic Panel: Recent Labs  Lab 01/21/20 0247 01/21/20 0525 01/22/20 0409 01/23/20 0258  NA 140 141 140 140  K 3.3* 3.2* 4.0 4.0  CL 99 98 100 100  CO2 32 36* 32 32  GLUCOSE 143* 145* 114* 105*  BUN 19 21 28* 25*  CREATININE 0.56 0.61 1.07* 0.80  CALCIUM 8.5* 8.8* 8.1* 8.3*  MG  --  1.7 1.6* 2.2   GFR: Estimated Creatinine Clearance: 46.9 mL/min (by C-G formula based on SCr of 0.8 mg/dL). Liver Function Tests: Recent Labs  Lab 01/21/20 0247  AST 18  ALT 14  ALKPHOS 56  BILITOT 0.5  PROT 6.4*  ALBUMIN 3.8   No results for input(s): LIPASE, AMYLASE in the last 168 hours. No results for input(s): AMMONIA in the last 168 hours. Coagulation Profile: Recent Labs  Lab 01/21/20 0247  INR 0.9   Cardiac Enzymes: No results for input(s): CKTOTAL, CKMB, CKMBINDEX, TROPONINI in the last 168 hours. BNP (last 3 results) No results for input(s): PROBNP in the last 8760 hours. HbA1C: No results for input(s): HGBA1C in the last 72 hours. CBG: No results for input(s): GLUCAP in the last 168 hours. Lipid Profile: No results for input(s): CHOL, HDL, LDLCALC, TRIG, CHOLHDL,  LDLDIRECT in the last 72 hours. Thyroid Function Tests: Recent Labs    01/21/20 0525  TSH 0.679   Anemia Panel: No results for input(s): VITAMINB12, FOLATE, FERRITIN, TIBC, IRON, RETICCTPCT in the last 72 hours. Sepsis Labs: No results for input(s): PROCALCITON, LATICACIDVEN in the last 168 hours.  Recent Results (from the past 240 hour(s))  Urine culture     Status: None   Collection Time: 01/21/20  3:29 AM   Specimen: Urine, Random  Result Value Ref Range Status   Specimen Description   Final    URINE, RANDOM Performed at [facility], [address], [place], [place] [address]    Special Requests   Final    NONE Performed at [facility], [address], [place], [place] [address]    Culture   Final    NO GROWTH Performed at [facility], [place] [address], [place], [place] [address]    Report Status 01/22/2020 FINAL  Final  Respiratory Panel by RT PCR (Flu A&B, Covid) -     Status: None   Collection Time: 01/21/20  3:29 AM  Result Value Ref Range Status   SARS Coronavirus 2 by RT PCR NEGATIVE NEGATIVE Final    Comment: (NOTE) SARS-CoV-2 target nucleic acids are NOT DETECTED.  The SARS-CoV-2 RNA is generally detectable in upper respiratoy specimens during the acute phase of infection. The lowest concentration of SARS-CoV-2 viral copies this assay can detect is 131 copies/mL. A negative result does not preclude SARS-Cov-2 infection and should not be used as the sole basis for treatment or other patient management decisions. A negative result may occur with  improper specimen collection/handling, submission of specimen other than nasopharyngeal swab, presence of viral mutation(s) within the areas targeted by this assay, and inadequate number of viral copies (<131 copies/mL). A negative result must be combined with clinical observations, patient history, and epidemiological information. The expected result is Negative.  Fact Sheet for Patients:   PinkCheek.be  Fact Sheet for Healthcare Providers:  GravelBags.it  This test is no t yet approved or cleared by the [place] FDA and  has been authorized for detection and/or diagnosis of SARS-CoV-2 by FDA under an Emergency Use Authorization (EUA). This EUA will remain  in effect (meaning this test can be used) for the duration of the COVID-19 declaration under Section 564(b)(1) of the Act, 21 U.S.C. section 360bbb-3(b)(1), unless the authorization is terminated or revoked sooner.     Influenza A by PCR NEGATIVE NEGATIVE Final   Influenza B by PCR NEGATIVE NEGATIVE Final    Comment: (NOTE) The Xpert Xpress SARS-CoV-2/FLU/RSV assay is intended as an aid in  the diagnosis of influenza from Nasopharyngeal swab specimens and  should not be used as a sole basis for treatment. Nasal washings and  aspirates are unacceptable for Xpert Xpress SARS-CoV-2/FLU/RSV  testing.  Fact Sheet  for Patients: PinkCheek.be  Fact Sheet for Healthcare Providers: GravelBags.it  This test is not yet approved or cleared by the [place] FDA and  has been authorized for detection and/or diagnosis of SARS-CoV-2 by  FDA under an Emergency Use Authorization (EUA). This EUA will remain  in effect (meaning this test can be used) for the duration of the  Covid-19 declaration under Section 564(b)(1) of the Act, 21  U.S.C. section 360bbb-3(b)(1), unless the authorization is  terminated or revoked. Performed at [facility], [address] [place], [place] [address]          Radiology Studies: DG Chest [place] 1 View  Result Date: 01/24/2020 CLINICAL DATA:  Shortness of breath EXAM: PORTABLE CHEST 1 VIEW COMPARISON:  01/21/2020 FINDINGS: Left upper lobe pulmonary nodule is less clearly visualized on this study due to overlapping structures. Right apical nodule is unchanged.  The lungs are hyperinflated with diffuse interstitial prominence. No pleural effusion or pneumothorax. No lobar consolidation IMPRESSION: 1. COPD without acute airspace disease. 2. Unchanged right apical nodule. 3. Left upper lobe nodule less clearly seen on this study. Electronically Signed   By: Ulyses Jarred M.D.   On: 01/24/2020 01:49        Scheduled Meds: . budesonide  0.25 mg Nebulization BID  . busPIRone  5 mg Oral BID  . calcium carbonate  1 tablet Oral Daily  . docusate sodium  100 mg Oral BID  . escitalopram  20 mg Oral Daily  . ipratropium-albuterol      . levothyroxine  50 mcg Oral Once per day on Mon Wed Fri  . levothyroxine  75 mcg Oral Once per day on Sun Tue Thu Sat  . linaclotide  72 mcg Oral QAC breakfast  . LORazepam  0.5 mg Oral TID  . mirtazapine  30 mg Oral QHS  . pantoprazole  40 mg Oral Daily  . perphenazine  8 mg Oral QHS  . polyethylene glycol  17 g Oral Daily  . potassium chloride  40 mEq Oral Once  . theophylline  100 mg Oral QHS  . tiotropium  18 mcg Inhalation Daily   Continuous Infusions: .  ceFAZolin (ANCEF) IV       LOS: 3 days    Time spent: [place], MD [facility] Pager [phone]  If 7PM-7AM, please contact night-coverage [email or web address] Password [email or web address] 01/24/2020, 3:08 AM

## 2020-01-24 NOTE — Progress Notes (Signed)
   01/24/20 0136  Assess: MEWS Score  BP 130/66  Pulse Rate (!) 137  Resp (!) 24  SpO2 92 %  O2 Device Nasal Cannula  O2 Flow Rate (L/min) 3 L/min  Assess: MEWS Score  MEWS Temp 0  MEWS Systolic 0  MEWS Pulse 3  MEWS RR 1  MEWS LOC 0  MEWS Score 4  MEWS Score Color Red  Assess: if the MEWS score is Yellow or Red  Were vital signs taken at a resting state? Yes  Focused Assessment Change from prior assessment (see assessment flowsheet)  Early Detection of Sepsis Score *See Row Information* Low  MEWS guidelines implemented *See Row Information* No, previously yellow, continue vital signs every 4 hours  Take Vital Signs  Increase Vital Sign Frequency  Red: Q 1hr X 4 then Q 4hr X 4, if remains red, continue Q 4hrs  Escalate  MEWS: Escalate Red: discuss with charge nurse/RN and provider, consider discussing with RRT  Notify: Charge Nurse/RN  Name of Charge Nurse/RN Notified Kasey RN  Date Charge Nurse/RN Notified 01/24/20  Time Charge Nurse/RN Notified 0140  Notify: Provider  Provider Name/Title Judd Gaudier MD  Date Provider Notified 01/24/20  Time Provider Notified 0140  Notification Type  (secure chat )  Notification Reason Change in status  Response Other (Comment) (xray ordered )  Date of Provider Response 01/24/20  Time of Provider Response 0149  Document  Patient Outcome Stabilized after interventions  Progress note created (see row info) Yes

## 2020-01-25 DIAGNOSIS — S72001D Fracture of unspecified part of neck of right femur, subsequent encounter for closed fracture with routine healing: Secondary | ICD-10-CM | POA: Diagnosis not present

## 2020-01-25 LAB — BPAM RBC
Blood Product Expiration Date: 202111062359
ISSUE DATE / TIME: 202110151703
Unit Type and Rh: 6200

## 2020-01-25 LAB — TYPE AND SCREEN
ABO/RH(D): A POS
Antibody Screen: NEGATIVE
Unit division: 0

## 2020-01-25 LAB — POTASSIUM: Potassium: 3.8 mmol/L (ref 3.5–5.1)

## 2020-01-25 LAB — CBC WITH DIFFERENTIAL/PLATELET
Abs Immature Granulocytes: 0.03 10*3/uL (ref 0.00–0.07)
Basophils Absolute: 0 10*3/uL (ref 0.0–0.1)
Basophils Relative: 0 %
Eosinophils Absolute: 0 10*3/uL (ref 0.0–0.5)
Eosinophils Relative: 0 %
HCT: 22.6 % — ABNORMAL LOW (ref 36.0–46.0)
Hemoglobin: 7.2 g/dL — ABNORMAL LOW (ref 12.0–15.0)
Immature Granulocytes: 1 %
Lymphocytes Relative: 9 %
Lymphs Abs: 0.4 10*3/uL — ABNORMAL LOW (ref 0.7–4.0)
MCH: 31.2 pg (ref 26.0–34.0)
MCHC: 31.9 g/dL (ref 30.0–36.0)
MCV: 97.8 fL (ref 80.0–100.0)
Monocytes Absolute: 0.2 10*3/uL (ref 0.1–1.0)
Monocytes Relative: 5 %
Neutro Abs: 4 10*3/uL (ref 1.7–7.7)
Neutrophils Relative %: 85 %
Platelets: 173 10*3/uL (ref 150–400)
RBC: 2.31 MIL/uL — ABNORMAL LOW (ref 3.87–5.11)
RDW: 12.8 % (ref 11.5–15.5)
WBC: 4.7 10*3/uL (ref 4.0–10.5)
nRBC: 0 % (ref 0.0–0.2)

## 2020-01-25 LAB — TRANSFUSION REACTION
DAT C3: NEGATIVE
Post RXN DAT IgG: NEGATIVE

## 2020-01-25 LAB — MAGNESIUM: Magnesium: 2 mg/dL (ref 1.7–2.4)

## 2020-01-25 NOTE — Progress Notes (Addendum)
Progress Note    Melissa George  UMP:[id number] DOB: Sep 01, 1950  DOA: 01/21/2020 PCP: Remi Haggard, FNP      Brief Narrative:    Medical records reviewed and are as summarized below:  Melissa George is a 69 y.o. female with medical history significant for CAD, COPD, depression, anxiety, who presented to the hospital with acute onset of right hip pain following a mechanical fall at the group.  She was found to have right hip fracture.  She was treated with analgesics.  She was seen in consultation by the orthopedic surgeon and she underwent right hip hemiarthroplasty on 01/21/2020.  She developed AKI requiring treatment with IV fluids.      Assessment/Plan:   Principal Problem:   Closed fracture of neck of right femur (HCC) Active Problems:   COPD (chronic obstructive pulmonary disease) (HCC)   Depression with anxiety   S/P right hip fracture   AKI (acute kidney injury) ([place]) Altered mental status/acute toxic encephalopathy Acute kidney injury Acute blood loss anemia   Body mass index is 18.29 kg/m.    PLAN   Initially, there was concern for blood transfusion reaction after patient was noted to be tachycardic, hypoxemic during blood transfusion.  However, transfusion reaction test did not show any evidence of hemolytic reaction.  Patient can have additional blood transfusion if required but will hold off further blood transfusion for now.  H&H is stable but continue to monitor.  Status post right hip hemiarthroplasty on 01/21/2020.  Analgesics as needed for pain.  Continue bronchodilators for COPD.   Continue 4 L/min oxygen via nasal cannula for chronic hypoxemic and hypercapnic respiratory failure.  Patient suffers from anxiety and is possible that acute event that she experienced during blood transfusion yesterday may be related to anxiety.  Continue psychotropics and lorazepam.  Awaiting placement to SNF for short-term rehab versus group home  with hospice.  Follow-up with social worker to assist with disposition.    Diet Order            Diet regular Room service appropriate? Yes; Fluid consistency: Thin  Diet effective now                    Consultants:  Orthopedic surgeon  Procedures:  Right hip hemiarthroplasty    Medications:   . sodium chloride   Intravenous Once  . budesonide  0.25 mg Nebulization BID  . busPIRone  5 mg Oral BID  . calcium carbonate  1 tablet Oral Daily  . docusate sodium  100 mg Oral BID  . escitalopram  20 mg Oral Daily  . ipratropium  0.5 mg Nebulization Q6H  . levalbuterol  0.63 mg Nebulization Q6H  . levothyroxine  50 mcg Oral Once per day on Mon Wed Fri  . levothyroxine  75 mcg Oral Once per day on Sun Tue Thu Sat  . linaclotide  72 mcg Oral QAC breakfast  . LORazepam  0.5 mg Oral TID  . mirtazapine  30 mg Oral QHS  . pantoprazole  40 mg Oral Daily  . perphenazine  8 mg Oral QHS  . polyethylene glycol  17 g Oral Daily  . theophylline  100 mg Oral QHS  . tiotropium  18 mcg Inhalation Daily   Continuous Infusions: .  ceFAZolin (ANCEF) IV       Anti-infectives (From admission, onward)   Start     Dose/Rate Route Frequency Ordered Stop   01/21/20 2200  ceFAZolin (ANCEF)  IVPB 1 g/50 mL premix        1 g 100 mL/hr over 30 Minutes Intravenous Every 6 hours 01/21/20 1745 01/22/20 1110   01/21/20 1519  ceFAZolin (ANCEF) 2-4 GM/100ML-% IVPB       Note to Pharmacy: Milinda Cave   : cabinet override      01/21/20 1519 01/22/20 0329   01/21/20 1517  ceFAZolin (ANCEF) 1-4 GM/50ML-% IVPB       Note to Pharmacy: Sharmon Leyden   : cabinet override      01/21/20 1517 01/22/20 2105   01/21/20 0924  ceFAZolin (ANCEF) IVPB 2g/100 mL premix        2 g 200 mL/hr over 30 Minutes Intravenous 30 min pre-op 01/21/20 0925               Family Communication/Anticipated D/C date and plan/Code Status   DVT prophylaxis: SCDs Start: 01/21/20 1746 Place and maintain  sequential compression device Start: 01/21/20 0704     Code Status: DNR  Family Communication: None Disposition Plan:    Status is: Inpatient  Remains inpatient appropriate because:Altered mental status and Unsafe d/c plan   Dispo: The patient is from: Group home              Anticipated d/c is to: SNF              Anticipated d/c date is: 1 day              Patient currently is not medically stable to d/c.           Subjective:   Interval events noted.  Rapid response was called after patient developed shortness of breath, nausea, tachycardia, hypertension and decreased oxygen saturation.  Reportedly, these changes occurred about 15 minutes into her blood transfusion.  It is worth noting that patient had previously been tachycardic prior to blood transfusion.  Objective:    Vitals:   01/25/20 0543 01/25/20 0700 01/25/20 0827 01/25/20 1133  BP:  (!) 150/62 (!) 120/57 92/81  Pulse:  (!) 136 95 (!) 104  Resp:  20 18 18   Temp:  98.3 F (36.8 C) 98.4 F (36.9 C) 98.5 F (36.9 C)  TempSrc:   Oral Oral  SpO2: 95% 94% 94% 100%  Weight:      Height:       No data found.   Intake/Output Summary (Last 24 hours) at 01/25/2020 1140 Last data filed at 01/25/2020 1021 Gross per 24 hour  Intake 240 ml  Output 2150 ml  Net -1910 ml   Filed Weights   01/21/20 1513  Weight: 45.4 kg    Exam:   GEN: NAD SKIN: No rash EYES: EOMI ENT: MMM CV: RRR PULM: Bilateral expiratory wheezing.  No rales heard ABD: soft, ND, NT, +BS CNS: Alert and oriented to person and place.  Speech is slow. EXT: Right hip surgical wound with clean and dry dressing.  Mild surgical tenderness.  .   Data Reviewed:   I have personally reviewed following labs and imaging studies:  Labs: Labs show the following:   Basic Metabolic Panel: Recent Labs  Lab 01/21/20 0247 01/21/20 0247 01/21/20 0525 01/21/20 0525 01/22/20 0409 01/22/20 0409 01/23/20 0258 01/23/20 0258  01/24/20 0252 01/25/20 0435  NA 140  --  141  --  140  --  140  --  142  --   K 3.3*   < > 3.2*   < > 4.0   < > 4.0   < >  3.8 3.8  CL 99  --  98  --  100  --  100  --  99  --   CO2 32  --  36*  --  32  --  32  --  32  --   GLUCOSE 143*  --  145*  --  114*  --  105*  --  133*  --   BUN 19  --  21  --  28*  --  25*  --  18  --   CREATININE 0.56  --  0.61  --  1.07*  --  0.80  --  0.60  --   CALCIUM 8.5*  --  8.8*  --  8.1*  --  8.3*  --  8.5*  --   MG  --   --  1.7  --  1.6*  --  2.2  --   --  2.0   < > = values in this interval not displayed.   GFR Estimated Creatinine Clearance: 46.2 mL/min (by C-G formula based on SCr of 0.6 mg/dL). Liver Function Tests: Recent Labs  Lab 01/21/20 0247  AST 18  ALT 14  ALKPHOS 56  BILITOT 0.5  PROT 6.4*  ALBUMIN 3.8   No results for input(s): LIPASE, AMYLASE in the last 168 hours. No results for input(s): AMMONIA in the last 168 hours. Coagulation profile Recent Labs  Lab 01/21/20 0247  INR 0.9    CBC: Recent Labs  Lab 01/21/20 0247 01/21/20 0525 01/23/20 0258 01/24/20 0252 01/25/20 0435  WBC 12.4* 13.9* 9.1 9.7 4.7  NEUTROABS 8.5*  --  7.4 8.2* 4.0  HGB 11.1* 10.7* 7.3* 7.2* 7.2*  HCT 34.6* 33.0* 22.9* 23.0* 22.6*  MCV 96.4 96.2 99.1 99.1 97.8  PLT 195 158 123* 128* 173   Cardiac Enzymes: No results for input(s): CKTOTAL, CKMB, CKMBINDEX, TROPONINI in the last 168 hours. BNP (last 3 results) No results for input(s): PROBNP in the last 8760 hours. CBG: No results for input(s): GLUCAP in the last 168 hours. D-Dimer: No results for input(s): DDIMER in the last 72 hours. Hgb A1c: No results for input(s): HGBA1C in the last 72 hours. Lipid Profile: No results for input(s): CHOL, HDL, LDLCALC, TRIG, CHOLHDL, LDLDIRECT in the last 72 hours. Thyroid function studies: No results for input(s): TSH, T4TOTAL, T3FREE, THYROIDAB in the last 72 hours.  Invalid input(s): FREET3 Anemia work up: No results for input(s): VITAMINB12,  FOLATE, FERRITIN, TIBC, IRON, RETICCTPCT in the last 72 hours. Sepsis Labs: Recent Labs  Lab 01/21/20 0525 01/23/20 0258 01/24/20 0252 01/25/20 0435  WBC 13.9* 9.1 9.7 4.7    Microbiology Recent Results (from the past 240 hour(s))  Urine culture     Status: None   Collection Time: 01/21/20  3:29 AM   Specimen: Urine, Random  Result Value Ref Range Status   Specimen Description   Final    URINE, RANDOM Performed at [facility], [address], [place], [place] [address]    Special Requests   Final    NONE Performed at [facility], [address], [place], [place] [address]    Culture   Final    NO GROWTH Performed at [facility], [place] [address] [place],  [address]    Report Status 01/22/2020 FINAL  Final  Respiratory Panel by RT PCR (Flu A&B, Covid) -     Status: None   Collection Time: 01/21/20  3:29 AM  Result Value Ref Range  Status   SARS Coronavirus 2 by RT PCR NEGATIVE NEGATIVE Final    Comment: (NOTE) SARS-CoV-2 target nucleic acids are NOT DETECTED.  The SARS-CoV-2 RNA is generally detectable in upper respiratoy specimens during the acute phase of infection. The lowest concentration of SARS-CoV-2 viral copies this assay can detect is 131 copies/mL. A negative result does not preclude SARS-Cov-2 infection and should not be used as the sole basis for treatment or other patient management decisions. A negative result may occur with  improper specimen collection/handling, submission of specimen other than nasopharyngeal swab, presence of viral mutation(s) within the areas targeted by this assay, and inadequate number of viral copies (<131 copies/mL). A negative result must be combined with clinical observations, patient history, and epidemiological information. The expected result is Negative.  Fact Sheet for Patients:  PinkCheek.be  Fact Sheet for Healthcare Providers:   GravelBags.it  This test is no t yet approved or cleared by the [place] FDA and  has been authorized for detection and/or diagnosis of SARS-CoV-2 by FDA under an Emergency Use Authorization (EUA). This EUA will remain  in effect (meaning this test can be used) for the duration of the COVID-19 declaration under Section 564(b)(1) of the Act, 21 U.S.C. section 360bbb-3(b)(1), unless the authorization is terminated or revoked sooner.     Influenza A by PCR NEGATIVE NEGATIVE Final   Influenza B by PCR NEGATIVE NEGATIVE Final    Comment: (NOTE) The Xpert Xpress SARS-CoV-2/FLU/RSV assay is intended as an aid in  the diagnosis of influenza from Nasopharyngeal swab specimens and  should not be used as a sole basis for treatment. Nasal washings and  aspirates are unacceptable for Xpert Xpress SARS-CoV-2/FLU/RSV  testing.  Fact Sheet for Patients: PinkCheek.be  Fact Sheet for Healthcare Providers: GravelBags.it  This test is not yet approved or cleared by the [place] FDA and  has been authorized for detection and/or diagnosis of SARS-CoV-2 by  FDA under an Emergency Use Authorization (EUA). This EUA will remain  in effect (meaning this test can be used) for the duration of the  Covid-19 declaration under Section 564(b)(1) of the Act, 21  U.S.C. section 360bbb-3(b)(1), unless the authorization is  terminated or revoked. Performed at [facility], [address] [place],  [address]     Procedures and diagnostic studies:  DG Chest [facility] 1 View  Result Date: 01/24/2020 CLINICAL DATA:  Shortness of breath EXAM: PORTABLE CHEST 1 VIEW COMPARISON:  01/21/2020 FINDINGS: Left upper lobe pulmonary nodule is less clearly visualized on this study due to overlapping structures. Right apical nodule is unchanged. The lungs are hyperinflated with diffuse interstitial prominence. No  pleural effusion or pneumothorax. No lobar consolidation IMPRESSION: 1. COPD without acute airspace disease. 2. Unchanged right apical nodule. 3. Left upper lobe nodule less clearly seen on this study. Electronically Signed   By: Ulyses Jarred M.D.   On: 01/24/2020 01:49               LOS: 4 days   Doranne Schmutz  [facility]   Pager on [email or web address]. If 7PM-7AM, please contact night-coverage at [email or web address]     01/25/2020, 11:40 AM

## 2020-01-25 NOTE — Progress Notes (Signed)
Physical Therapy Treatment Patient Details Name: Melissa George MRN: [id number] DOB: Sep 07, 1950 Today's Date: 01/25/2020    History of Present Illness Melissa George is a 69 y/o female who underwent R anterior hip hemiarthroplasty after sustaining a mechanical fall. 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    PT Comments    RR called last night due to reaction from blood transfusion. Pt limited with further mobility today secondary to tachycardia, therefore, treatment focused on bed level exercises for strengthening. Pt able to complete x10 reps of BLE exercises with increased pain from 7-10/10 at R hip. Pt HR ranged from 116-132 bpm with SpO2 >90% throughout session on 4L O2 via HFNC.  Pt will continue to benefit from skilled acute PT services to address deficits for return to baseline function.    Follow Up Recommendations  SNF;Supervision for mobility/OOB     Equipment Recommendations  Rolling walker with 5" wheels;3in1 (PT)    Recommendations for Other Services       Precautions / Restrictions Precautions Precautions: Fall Restrictions Weight Bearing Restrictions: Yes RLE Weight Bearing: Weight bearing as tolerated    Mobility  Bed Mobility               General bed mobility comments: deferred due to tachycardia  Transfers                    Ambulation/Gait                 Stairs             Wheelchair Mobility    Modified Rankin (Stroke Patients Only)       Balance       Sitting balance - Comments: deferred due to tachycardia                                    Cognition Arousal/Alertness: Awake/alert Behavior During Therapy: WFL for tasks assessed/performed;Flat affect Overall Cognitive Status: Within Functional Limits for tasks assessed                                        Exercises Total Joint Exercises Ankle Circles/Pumps:  AROM;Strengthening;Both;10 reps;Supine Gluteal Sets: AROM;Strengthening;Both;10 reps;Supine Short Arc Quad: AROM;Both;10 reps;Supine Heel Slides: Supine;AROM;10 reps;Strengthening;Both Hip ABduction/ADduction: AROM;Supine;Strengthening;Both;10 reps Other Exercises Other Exercises: Functional mobility deferred today secondary to tachycardia. Pt with resting HR of 110bpm upon entry, with max HR at 132bpm post session. Pt on 4L O2 via HFNC with SpO2 >90% during session. Labored breathing noted post session.    General Comments        Pertinent Vitals/Pain Pain Score: 7  Pain Location: 7/10 sharp R hip pain that progressed to 10/10 with activity Pain Descriptors / Indicators: Discomfort;Grimacing;Guarding;Sore;Sharp Pain Intervention(s): Limited activity within patient's tolerance;Monitored during session;Repositioned    Home Living                      Prior Function            PT Goals (current goals can now be found in the care plan section) Progress towards PT goals: Progressing toward goals    Frequency    7X/week      PT Plan Current plan remains appropriate    Co-evaluation  AM-PAC PT "6 Clicks" Mobility   Outcome Measure  Help needed turning from your back to your side while in a flat bed without using bedrails?: A Lot Help needed moving from lying on your back to sitting on the side of a flat bed without using bedrails?: A Lot Help needed moving to and from a bed to a chair (including a wheelchair)?: A Lot Help needed standing up from a chair using your arms (e.g., wheelchair or bedside chair)?: A Lot Help needed to walk in hospital room?: Total Help needed climbing 3-5 steps with a railing? : Total 6 Click Score: 10    End of Session Equipment Utilized During Treatment: Oxygen Activity Tolerance: Patient limited by pain;Treatment limited secondary to medical complications (Comment) Patient left: in bed;with family/visitor  present;with call bell/phone within reach Nurse Communication: Mobility status PT Visit Diagnosis: Unsteadiness on feet (R26.81);Other abnormalities of gait and mobility (R26.89);Muscle weakness (generalized) (M62.81);Repeated falls (R29.6);History of falling (Z91.81);Pain Pain - Right/Left: Right Pain - part of body: Hip     Time: 4888-9169 PT Time Calculation (min) (ACUTE ONLY): 11 min  Charges:  $Therapeutic Exercise: 8-22 mins                     Herminio Commons, PT, DPT 2:18 PM,01/25/20

## 2020-01-25 NOTE — Progress Notes (Signed)
PT Cancellation Note  Patient Details Name: Melissa George MRN: [id number] DOB: 02-04-1951   Cancelled Treatment:    Reason Eval/Treat Not Completed: Other (comment) (Pt requesting that PT come back after lunch. Will follow up with therapy treatment in the afternoon as time permits.)  Herminio Commons, PT, DPT 11:28 AM,01/25/20

## 2020-01-25 NOTE — Consult Note (Signed)
[facility] for Electrolyte Monitoring and Replacement   Recent Labs: Potassium (mmol/L)  Date Value  01/25/2020 3.8  03/02/2014 3.3 (L)   Magnesium (mg/dL)  Date Value  01/25/2020 2.0  01/23/2014 2.2   Calcium (mg/dL)  Date Value  01/24/2020 8.5 (L)   Calcium, Total (mg/dL)  Date Value  03/02/2014 8.8   Albumin (g/dL)  Date Value  01/21/2020 3.8  03/02/2014 3.6   Phosphorus (mg/dL)  Date Value  10/07/2014 3.4   Sodium (mmol/L)  Date Value  01/24/2020 142  03/02/2014 141    Assessment: Patient is a 69 y/o F with medical history including COPD, CAD, anxiety who presented to the ED 10/12 with R hip pain after mechanical fall. Patient found to have R hip fracture. Ortho consulted and plan is for R hip hemiarthroplasty 10/12.   MIVF: NaCl at 125 mL/hr  Goal of Therapy:  Electrolytes within normal limits  Plan:  --No replenishment warranted at this time --Re-check electrolytes with AM labs and continue to monitor.  Noralee Space, PharmD, BCPS Clinical Pharmacist 01/25/2020 8:09 AM

## 2020-01-25 NOTE — Progress Notes (Signed)
Subjective:  Patient reports pain as mild.    Objective:   VITALS:   Vitals:   01/25/20 0543 01/25/20 0700 01/25/20 0827 01/25/20 1133  BP:  (!) 150/62 (!) 120/57 92/81  Pulse:  (!) 136 95 (!) 104  Resp:  20 18 18   Temp:  98.3 F (36.8 C) 98.4 F (36.9 C) 98.5 F (36.9 C)  TempSrc:   Oral Oral  SpO2: 95% 94% 94% 100%  Weight:      Height:        PHYSICAL EXAM:  ABD soft Sensation intact distally Dorsiflexion/Plantar flexion intact Incision: scant drainage No cellulitis present Compartment soft  LABS  Results for orders placed or performed during the hospital encounter of 01/21/20 (from the past 24 hour(s))  Blood gas, venous     Status: Abnormal (Preliminary result)   Collection Time: 01/24/20  6:26 PM  Result Value Ref Range   FIO2 0.36    Delivery systems NASAL CANNULA    pH, Ven 7.40 7.25 - 7.43   pCO2, Ven 63 (H) 44 - 60 mmHg   pO2, Ven PENDING 32 - 45 mmHg   Bicarbonate 39.0 (H) 20.0 - 28.0 mmol/L   Acid-Base Excess 12.7 (H) 0.0 - 2.0 mmol/L   O2 Saturation 52.8 %   Patient temperature 37.0    Collection site VEIN    Sample type VENOUS   Transfusion reaction     Status: None   Collection Time: 01/24/20  6:26 PM  Result Value Ref Range   Post RXN DAT IgG NEG    DAT C3 NEG    Path interp tx rxn      C/ DR Claudette Laws 01/24/20 @ 7:55PM NO LABORATORY EVIDENCE OF HEMOLYTIC TRANSFUSION REACTION. PATIENT IS FINE TO GET MORE BLOOD HOWEVER HE DOES WANT A CALL IF MORE BLOOD IS ORDERED./MSK PATIENT EXPERIENCED ANXIETY, TACHYCARDIA, IMPENDING DOOM AND SOB Performed at [facility], [address] [place], [place] [address]   Urinalysis, Complete w Microscopic     Status: Abnormal   Collection Time: 01/24/20  6:52 PM  Result Value Ref Range   Color, Urine COLORLESS (A) YELLOW   APPearance CLEAR (A) CLEAR   Specific Gravity, Urine 1.004 (L) 1.005 - 1.030   pH 6.0 5.0 - 8.0   Glucose, UA NEGATIVE NEGATIVE mg/dL   Hgb urine dipstick SMALL (A)  NEGATIVE   Bilirubin Urine NEGATIVE NEGATIVE   Ketones, ur NEGATIVE NEGATIVE mg/dL   Protein, ur NEGATIVE NEGATIVE mg/dL   Nitrite NEGATIVE NEGATIVE   Leukocytes,Ua NEGATIVE NEGATIVE   RBC / HPF 0-5 0 - 5 RBC/hpf   WBC, UA 0-5 0 - 5 WBC/hpf   Bacteria, UA NONE SEEN NONE SEEN   Squamous Epithelial / LPF NONE SEEN 0 - 5   Mucus PRESENT   CBC with Differential/Platelet     Status: Abnormal   Collection Time: 01/25/20  4:35 AM  Result Value Ref Range   WBC 4.7 4.0 - 10.5 K/uL   RBC 2.31 (L) 3.87 - 5.11 MIL/uL   Hemoglobin 7.2 (L) 12.0 - 15.0 g/dL   HCT 22.6 (L) 36 - 46 %   MCV 97.8 80.0 - 100.0 fL   MCH 31.2 26.0 - 34.0 pg   MCHC 31.9 30.0 - 36.0 g/dL   RDW 12.8 11.5 - 15.5 %   Platelets 173 150 - 400 K/uL   nRBC 0.0 0.0 - 0.2 %   Neutrophils Relative % 85 %   Neutro Abs 4.0 1.7 -  7.7 K/uL   Lymphocytes Relative 9 %   Lymphs Abs 0.4 (L) 0.7 - 4.0 K/uL   Monocytes Relative 5 %   Monocytes Absolute 0.2 0.1 - 1.0 K/uL   Eosinophils Relative 0 %   Eosinophils Absolute 0.0 0.0 - 0.5 K/uL   Basophils Relative 0 %   Basophils Absolute 0.0 0.0 - 0.1 K/uL   Immature Granulocytes 1 %   Abs Immature Granulocytes 0.03 0.00 - 0.07 K/uL  Magnesium     Status: None   Collection Time: 01/25/20  4:35 AM  Result Value Ref Range   Magnesium 2.0 1.7 - 2.4 mg/dL  Potassium     Status: None   Collection Time: 01/25/20  4:35 AM  Result Value Ref Range   Potassium 3.8 3.5 - 5.1 mmol/L    DG Chest Port 1 View  Result Date: 01/24/2020 CLINICAL DATA:  Shortness of breath EXAM: PORTABLE CHEST 1 VIEW COMPARISON:  01/21/2020 FINDINGS: Left upper lobe pulmonary nodule is less clearly visualized on this study due to overlapping structures. Right apical nodule is unchanged. The lungs are hyperinflated with diffuse interstitial prominence. No pleural effusion or pneumothorax. No lobar consolidation IMPRESSION: 1. COPD without acute airspace disease. 2. Unchanged right apical nodule. 3. Left upper lobe  nodule less clearly seen on this study. Electronically Signed   By: Ulyses Jarred M.D.   On: 01/24/2020 01:49    Assessment/Plan: 4 Days Post-Op   Principal Problem:   Closed fracture of neck of right femur (HCC) Active Problems:   COPD (chronic obstructive pulmonary disease) (HCC)   Depression with anxiety   S/P right hip fracture   AKI (acute kidney injury) ([place])   Up with therapy WBAT RLE Continue pain control Hemoglobin stable  Continue to hold Lovenox Follow with Dr. Harlow Mares office in 2 weeks for stable removal, call office for appt. [phone], Estimate follow up on 12/05/19  Patient is already enrolled in hospice. Follow-up with hospice team and social worker for disposition.   Lovell Sheehan , MD 01/25/2020, 1:41 PM

## 2020-01-25 NOTE — Plan of Care (Signed)

## 2020-01-26 DIAGNOSIS — D649 Anemia, unspecified: Secondary | ICD-10-CM | POA: Diagnosis not present

## 2020-01-26 DIAGNOSIS — N179 Acute kidney failure, unspecified: Secondary | ICD-10-CM | POA: Diagnosis not present

## 2020-01-26 DIAGNOSIS — S72001D Fracture of unspecified part of neck of right femur, subsequent encounter for closed fracture with routine healing: Secondary | ICD-10-CM | POA: Diagnosis not present

## 2020-01-26 DIAGNOSIS — F418 Other specified anxiety disorders: Secondary | ICD-10-CM | POA: Diagnosis not present

## 2020-01-26 LAB — CBC
HCT: 22.5 % — ABNORMAL LOW (ref 36.0–46.0)
Hemoglobin: 6.9 g/dL — ABNORMAL LOW (ref 12.0–15.0)
MCH: 30.5 pg (ref 26.0–34.0)
MCHC: 30.7 g/dL (ref 30.0–36.0)
MCV: 99.6 fL (ref 80.0–100.0)
Platelets: 227 10*3/uL (ref 150–400)
RBC: 2.26 MIL/uL — ABNORMAL LOW (ref 3.87–5.11)
RDW: 13.1 % (ref 11.5–15.5)
WBC: 5.9 10*3/uL (ref 4.0–10.5)
nRBC: 0.3 % — ABNORMAL HIGH (ref 0.0–0.2)

## 2020-01-26 LAB — PREPARE RBC (CROSSMATCH)

## 2020-01-26 LAB — MAGNESIUM: Magnesium: 1.9 mg/dL (ref 1.7–2.4)

## 2020-01-26 LAB — POTASSIUM: Potassium: 3.5 mmol/L (ref 3.5–5.1)

## 2020-01-26 MED ORDER — SODIUM CHLORIDE 0.9% IV SOLUTION: Freq: Once | INTRAVENOUS | Status: DC

## 2020-01-26 MED ORDER — DIPHENHYDRAMINE HCL 25 MG PO CAPS
25.0000 mg | ORAL_CAPSULE | Freq: Once | ORAL | Status: AC
  Administered 2020-01-26: 25 mg via ORAL
  Filled 2020-01-26: qty 1

## 2020-01-26 MED ORDER — IPRATROPIUM BROMIDE 0.02 % IN SOLN
0.5000 mg | Freq: Three times a day (TID) | RESPIRATORY_TRACT | Status: DC
  Administered 2020-01-26 – 2020-01-30 (×13): 0.5 mg via RESPIRATORY_TRACT
  Filled 2020-01-26 (×14): qty 2.5

## 2020-01-26 MED ORDER — MAGNESIUM SULFATE IN D5W 1-5 GM/100ML-% IV SOLN
1.0000 g | Freq: Once | INTRAVENOUS | Status: AC
  Administered 2020-01-26: 1 g via INTRAVENOUS
  Filled 2020-01-26: qty 100

## 2020-01-26 MED ORDER — IPRATROPIUM BROMIDE 0.02 % IN SOLN
0.5000 mg | Freq: Four times a day (QID) | RESPIRATORY_TRACT | Status: DC | PRN
  Filled 2020-01-26: qty 2.5

## 2020-01-26 MED ORDER — LEVALBUTEROL HCL 0.63 MG/3ML IN NEBU
0.6300 mg | INHALATION_SOLUTION | Freq: Three times a day (TID) | RESPIRATORY_TRACT | Status: DC
  Administered 2020-01-26 – 2020-01-30 (×13): 0.63 mg via RESPIRATORY_TRACT
  Filled 2020-01-26 (×16): qty 3

## 2020-01-26 MED ORDER — LEVALBUTEROL HCL 0.63 MG/3ML IN NEBU
0.6300 mg | INHALATION_SOLUTION | Freq: Four times a day (QID) | RESPIRATORY_TRACT | Status: DC | PRN
  Filled 2020-01-26: qty 3

## 2020-01-26 NOTE — Plan of Care (Signed)

## 2020-01-26 NOTE — Progress Notes (Signed)
  Subjective:  Patient reports pain as mild.  Sitting with hips flexed.  Objective:   VITALS:   Vitals:   01/25/20 1355 01/25/20 1545 01/25/20 1948 01/26/20 1122  BP:  (!) 108/55  120/62  Pulse:  (!) 110  88  Resp:  18  17  Temp:  98.5 F (36.9 C)  98.6 F (37 C)  TempSrc:  Oral  Oral  SpO2: 94% 96% 95% 100%  Weight:      Height:        PHYSICAL EXAM:  Sensation intact distally Dorsiflexion/Plantar flexion intact Incision: dressing C/D/I No cellulitis present Compartment soft  LABS  Results for orders placed or performed during the hospital encounter of 01/21/20 (from the past 24 hour(s))  Potassium     Status: None   Collection Time: 01/26/20  5:18 AM  Result Value Ref Range   Potassium 3.5 3.5 - 5.1 mmol/L  Magnesium     Status: None   Collection Time: 01/26/20  5:18 AM  Result Value Ref Range   Magnesium 1.9 1.7 - 2.4 mg/dL  CBC     Status: Abnormal   Collection Time: 01/26/20  5:18 AM  Result Value Ref Range   WBC 5.9 4.0 - 10.5 K/uL   RBC 2.26 (L) 3.87 - 5.11 MIL/uL   Hemoglobin 6.9 (L) 12.0 - 15.0 g/dL   HCT 22.5 (L) 36 - 46 %   MCV 99.6 80.0 - 100.0 fL   MCH 30.5 26.0 - 34.0 pg   MCHC 30.7 30.0 - 36.0 g/dL   RDW 13.1 11.5 - 15.5 %   Platelets 227 150 - 400 K/uL   nRBC 0.3 (H) 0.0 - 0.2 %  Prepare RBC (crossmatch)     Status: None (Preliminary result)   Collection Time: 01/26/20  7:25 AM  Result Value Ref Range   Order Confirmation PENDING   Type and screen     Status: None   Collection Time: 01/26/20  9:01 AM  Result Value Ref Range   ABO/RH(D) A POS    Antibody Screen NEG    Sample Expiration      01/29/2020,2359 Performed at [facility], [address] [place], [place] [address]     No results found.  Assessment/Plan: 5 Days Post-Op   Principal Problem:   Closed fracture of neck of right femur (HCC) Active Problems:   COPD (chronic obstructive pulmonary disease) (HCC)   Depression with anxiety   S/P right hip  fracture   AKI (acute kidney injury) ([facility])   Symptomatic anemia   Up with therapy Discharge to SNF or hospice Follow-up in 12 to 14 days for staple removal   Lovell Sheehan , MD 01/26/2020, 3:13 PM

## 2020-01-26 NOTE — Consult Note (Signed)
[place] [facility]   Recent Labs: Potassium (mmol/L)  Date Value  01/26/2020 3.5  03/02/2014 3.3 (L)   Magnesium (mg/dL)  Date Value  01/26/2020 1.9  01/23/2014 2.2   Calcium (mg/dL)  Date Value  01/24/2020 8.5 (L)   Calcium, Total (mg/dL)  Date Value  03/02/2014 8.8   Albumin (g/dL)  Date Value  01/21/2020 3.8  03/02/2014 3.6   Phosphorus (mg/dL)  Date Value  10/07/2014 3.4   Sodium (mmol/L)  Date Value  01/24/2020 142  03/02/2014 141    Assessment: Patient is a 69 y/o F with medical history including COPD, CAD, anxiety who presented to the ED 10/12 with R hip pain after mechanical fall. Patient found to have R hip fracture. Ortho consulted and plan is for R hip hemiarthroplasty 10/12.    Goal of Therapy:  Electrolytes within normal limits  Plan:  K 3.5  Mag 1.9 Will order Magnesium sulfate 1 gram IV x1 --Re-check electrolytes with AM labs and continue to monitor.  Noralee Space, PharmD, BCPS Clinical Pharmacist 01/26/2020 12:06 PM

## 2020-01-26 NOTE — Progress Notes (Addendum)
Progress Note    Melissa George  PJA:[id number] DOB: [birth year]  DOA: 01/21/2020 PCP: Remi Haggard, FNP      Brief Narrative:    Medical records reviewed and are as summarized below:  Melissa George is a 69 y.o. female with medical history significant for CAD, COPD, depression, anxiety, who presented to the hospital with acute onset of right hip pain following a mechanical fall at the group.  She was found to have right hip fracture.  She was treated with analgesics.  She was seen in consultation by the orthopedic surgeon and she underwent right hip hemiarthroplasty on 01/21/2020.  She developed AKI requiring treatment with IV fluids.      Assessment/Plan:   Principal Problem:   Closed fracture of neck of right femur (HCC) Active Problems:   COPD (chronic obstructive pulmonary disease) (HCC)   Depression with anxiety   S/P right hip fracture   AKI (acute kidney injury) (HCC)   Symptomatic anemia Altered mental status/acute toxic encephalopathy Acute kidney injury Acute blood loss anemia   Body mass index is 18.29 kg/m.    PLAN   Transfuse 1 unit of packed red blood cells for symptomatic anemia with hemoglobin of 6.9.  Premedicate with 1 dose of Benadryl 25 mg.  Monitor patient closely for transfusion reaction.  Status post right hip hemiarthroplasty on 01/21/2020.  Analgesics as needed for pain.  Continue bronchodilators for COPD.  She is on 4 L/min oxygen for chronic hypoxemic and hypercapnic respiratory failure.  This is her baseline oxygen requirement.  Continue lorazepam and psychotropics for anxiety and depression.  Awaiting placement to SNF for short-term rehab versus group home with hospice.    Legal guardian with DSS has not made a decision yet.  Follow-up with social worker to assist with disposition.      Diet Order            Diet regular Room service appropriate? Yes; Fluid consistency: Thin  Diet effective now                     Consultants:  Orthopedic surgeon  Procedures:  Right hip hemiarthroplasty    Medications:   . sodium chloride   Intravenous Once  . sodium chloride   Intravenous Once  . budesonide  0.25 mg Nebulization BID  . busPIRone  5 mg Oral BID  . calcium carbonate  1 tablet Oral Daily  . diphenhydrAMINE  25 mg Oral Once  . docusate sodium  100 mg Oral BID  . escitalopram  20 mg Oral Daily  . ipratropium  0.5 mg Nebulization TID  . levalbuterol  0.63 mg Nebulization TID  . levothyroxine  50 mcg Oral Once per day on Mon Wed Fri  . levothyroxine  75 mcg Oral Once per day on Sun Tue Thu Sat  . linaclotide  72 mcg Oral QAC breakfast  . LORazepam  0.5 mg Oral TID  . mirtazapine  30 mg Oral QHS  . pantoprazole  40 mg Oral Daily  . perphenazine  8 mg Oral QHS  . polyethylene glycol  17 g Oral Daily  . theophylline  100 mg Oral QHS  . tiotropium  18 mcg Inhalation Daily   Continuous Infusions: .  ceFAZolin (ANCEF) IV       Anti-infectives (From admission, onward)   Start     Dose/Rate Route Frequency Ordered Stop   01/21/20 2200  ceFAZolin (ANCEF) IVPB 1 g/50 mL premix  1 g 100 mL/hr over 30 Minutes Intravenous Every 6 hours 01/21/20 1745 01/22/20 1110   01/21/20 1519  ceFAZolin (ANCEF) 2-4 GM/100ML-% IVPB       Note to Pharmacy: Milinda Cave   : cabinet override      01/21/20 1519 01/22/20 0329   01/21/20 1517  ceFAZolin (ANCEF) 1-4 GM/50ML-% IVPB       Note to Pharmacy: Sharmon Leyden   : cabinet override      01/21/20 1517 01/22/20 2105   01/21/20 0924  ceFAZolin (ANCEF) IVPB 2g/100 mL premix        2 g 200 mL/hr over 30 Minutes Intravenous 30 min pre-op 01/21/20 0925               Family Communication/Anticipated D/C date and plan/Code Status   DVT prophylaxis: SCDs Start: 01/21/20 1746 Place and maintain sequential compression device Start: 01/21/20 0704     Code Status: DNR  Family Communication: None Disposition Plan:    Status  is: Inpatient  Remains inpatient appropriate because:Unsafe d/c plan   Dispo: The patient is from: Group home              Anticipated d/c is to: SNF              Anticipated d/c date is: 1 day              Patient currently is not medically stable to d/c.           Subjective:   She complains of nausea, fatigue and right hip pain.  No shortness of breath or chest pain.  Objective:    Vitals:   01/25/20 1133 01/25/20 1355 01/25/20 1545 01/25/20 1948  BP: 92/81  (!) 108/55   Pulse: (!) 104  (!) 110   Resp: 18  18   Temp: 98.5 F (36.9 C)  98.5 F (36.9 C)   TempSrc: Oral  Oral   SpO2: 100% 94% 96% 95%  Weight:      Height:       No data found.   Intake/Output Summary (Last 24 hours) at 01/26/2020 1101 Last data filed at 01/26/2020 0534 Gross per 24 hour  Intake 0 ml  Output 1300 ml  Net -1300 ml   Filed Weights   01/21/20 1513  Weight: 45.4 kg    Exam:  GEN: NAD SKIN: Bruises on right hip EYES: Pale, anicteric ENT: MMM CV: RRR PULM: CTA B ABD: soft, ND, NT, +BS CNS: AAO x 3, non focal EXT: Mild swelling and tenderness of the right hip    .   Data Reviewed:   I have personally reviewed following labs and imaging studies:  Labs: Labs show the following:   Basic Metabolic Panel: Recent Labs  Lab 01/21/20 0247 01/21/20 0247 01/21/20 0525 01/21/20 0525 01/22/20 0409 01/22/20 0409 01/23/20 0258 01/23/20 0258 01/24/20 0252 01/24/20 0252 01/25/20 0435 01/26/20 0518  NA 140  --  141  --  140  --  140  --  142  --   --   --   K 3.3*   < > 3.2*   < > 4.0   < > 4.0   < > 3.8   < > 3.8 3.5  CL 99  --  98  --  100  --  100  --  99  --   --   --   CO2 32  --  36*  --  32  --  32  --  32  --   --   --   GLUCOSE 143*  --  145*  --  114*  --  105*  --  133*  --   --   --   BUN 19  --  21  --  28*  --  25*  --  18  --   --   --   CREATININE 0.56  --  0.61  --  1.07*  --  0.80  --  0.60  --   --   --   CALCIUM 8.5*  --  8.8*  --  8.1*  --   8.3*  --  8.5*  --   --   --   MG  --   --  1.7  --  1.6*  --  2.2  --   --   --  2.0 1.9   < > = values in this interval not displayed.   GFR Estimated Creatinine Clearance: 46.2 mL/min (by C-G formula based on SCr of 0.6 mg/dL). Liver Function Tests: Recent Labs  Lab 01/21/20 0247  AST 18  ALT 14  ALKPHOS 56  BILITOT 0.5  PROT 6.4*  ALBUMIN 3.8   No results for input(s): LIPASE, AMYLASE in the last 168 hours. No results for input(s): AMMONIA in the last 168 hours. Coagulation profile Recent Labs  Lab 01/21/20 0247  INR 0.9    CBC: Recent Labs  Lab 01/21/20 0247 01/21/20 0247 01/21/20 0525 01/23/20 0258 01/24/20 0252 01/25/20 0435 01/26/20 0518  WBC 12.4*   < > 13.9* 9.1 9.7 4.7 5.9  NEUTROABS 8.5*  --   --  7.4 8.2* 4.0  --   HGB 11.1*   < > 10.7* 7.3* 7.2* 7.2* 6.9*  HCT 34.6*   < > 33.0* 22.9* 23.0* 22.6* 22.5*  MCV 96.4   < > 96.2 99.1 99.1 97.8 99.6  PLT 195   < > 158 123* 128* 173 227   < > = values in this interval not displayed.   Cardiac Enzymes: No results for input(s): CKTOTAL, CKMB, CKMBINDEX, TROPONINI in the last 168 hours. BNP (last 3 results) No results for input(s): PROBNP in the last 8760 hours. CBG: No results for input(s): GLUCAP in the last 168 hours. D-Dimer: No results for input(s): DDIMER in the last 72 hours. Hgb A1c: No results for input(s): HGBA1C in the last 72 hours. Lipid Profile: No results for input(s): CHOL, HDL, LDLCALC, TRIG, CHOLHDL, LDLDIRECT in the last 72 hours. Thyroid function studies: No results for input(s): TSH, T4TOTAL, T3FREE, THYROIDAB in the last 72 hours.  Invalid input(s): FREET3 Anemia work up: No results for input(s): VITAMINB12, FOLATE, FERRITIN, TIBC, IRON, RETICCTPCT in the last 72 hours. Sepsis Labs: Recent Labs  Lab 01/23/20 0258 01/24/20 0252 01/25/20 0435 01/26/20 0518  WBC 9.1 9.7 4.7 5.9    Microbiology Recent Results (from the past 240 hour(s))  Urine culture     Status: None    Collection Time: 01/21/20  3:29 AM   Specimen: Urine, Random  Result Value Ref Range Status   Specimen Description   Final    URINE, RANDOM Performed at [facility], [address] [place], [place] [address]    Special Requests   Final    NONE Performed at [facility], [address] [place], [place] [address]    Culture   Final    NO GROWTH Performed at [facility], [place] [address], [place], [place]  [address]    Report Status 01/22/2020 FINAL  Final  Respiratory Panel by RT PCR (Flu A&B, Covid) -     Status: None   Collection Time: 01/21/20  3:29 AM  Result Value Ref Range Status   SARS Coronavirus 2 by RT PCR NEGATIVE NEGATIVE Final    Comment: (NOTE) SARS-CoV-2 target nucleic acids are NOT DETECTED.  The SARS-CoV-2 RNA is generally detectable in upper respiratoy specimens during the acute phase of infection. The lowest concentration of SARS-CoV-2 viral copies this assay can detect is 131 copies/mL. A negative result does not preclude SARS-Cov-2 infection and should not be used as the sole basis for treatment or other patient management decisions. A negative result may occur with  improper specimen collection/handling, submission of specimen other than nasopharyngeal swab, presence of viral mutation(s) within the areas targeted by this assay, and inadequate number of viral copies (<131 copies/mL). A negative result must be combined with clinical observations, patient history, and epidemiological information. The expected result is Negative.  Fact Sheet for Patients:  PinkCheek.be  Fact Sheet for Healthcare Providers:  GravelBags.it  This test is no t yet approved or cleared by the [place] FDA and  has been authorized for detection and/or diagnosis of SARS-CoV-2 by FDA under an Emergency Use Authorization (EUA). This EUA will remain  in effect (meaning this test can be used) for  the duration of the COVID-19 declaration under Section 564(b)(1) of the Act, 21 U.S.C. section 360bbb-3(b)(1), unless the authorization is terminated or revoked sooner.     Influenza A by PCR NEGATIVE NEGATIVE Final   Influenza B by PCR NEGATIVE NEGATIVE Final    Comment: (NOTE) The Xpert Xpress SARS-CoV-2/FLU/RSV assay is intended as an aid in  the diagnosis of influenza from Nasopharyngeal swab specimens and  should not be used as a sole basis for treatment. Nasal washings and  aspirates are unacceptable for Xpert Xpress SARS-CoV-2/FLU/RSV  testing.  Fact Sheet for Patients: PinkCheek.be  Fact Sheet for Healthcare Providers: GravelBags.it  This test is not yet approved or cleared by the [place] FDA and  has been authorized for detection and/or diagnosis of SARS-CoV-2 by  FDA under an Emergency Use Authorization (EUA). This EUA will remain  in effect (meaning this test can be used) for the duration of the  Covid-19 declaration under Section 564(b)(1) of the Act, 21  U.S.C. section 360bbb-3(b)(1), unless the authorization is  terminated or revoked. Performed at [facility], [address] [place], [place] [address]     Procedures and diagnostic studies:  No results found.             LOS: 5 days   Zavon Hyson  [facility]   Pager on [email or web address]. If 7PM-7AM, please contact night-coverage at [email or web address]     01/26/2020, 11:01 AM

## 2020-01-26 NOTE — Progress Notes (Signed)
Physical Therapy Treatment Patient Details Name: Melissa George MRN: [id number] DOB: 1951/04/10 Today's Date: 01/26/2020    History of Present Illness Melissa George is a 69 y/o female who underwent R anterior hip hemiarthroplasty after sustaining a mechanical fall. 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    PT Comments    HgB 6.9 this am and trending down.  Pt is able to participate in AAROM BLE to assist with pain control, comfort, skin integrety and circulation.  OOB/mobility deferred at this time.  Will continue as appropriate.  Follow Up Recommendations  SNF;Supervision for mobility/OOB     Equipment Recommendations  Rolling walker with 5" wheels;3in1 (PT)    Recommendations for Other Services       Precautions / Restrictions Precautions Precautions: Fall Restrictions Weight Bearing Restrictions: Yes RLE Weight Bearing: Weight bearing as tolerated    Mobility  Bed Mobility               General bed mobility comments: deferred due to HGB and general lethargy  Transfers                    Ambulation/Gait                 Stairs             Wheelchair Mobility    Modified Rankin (Stroke Patients Only)       Balance                                            Cognition Arousal/Alertness: Lethargic Behavior During Therapy: WFL for tasks assessed/performed Overall Cognitive Status: Within Functional Limits for tasks assessed                                        Exercises Total Joint Exercises Ankle Circles/Pumps: AROM;Strengthening;Both;10 reps;Supine Gluteal Sets: AROM;Strengthening;Both;10 reps;Supine Short Arc Quad: AROM;Both;10 reps;Supine Heel Slides: Supine;AROM;10 reps;Strengthening;Both Hip ABduction/ADduction: AROM;Supine;Strengthening;Both;10 reps    General Comments        Pertinent Vitals/Pain Pain Assessment:  Faces Faces Pain Scale: Hurts little more Pain Location: reports pain with AAROM but apears generally comfortable with ex's Pain Descriptors / Indicators: Grimacing;Sore Pain Intervention(s): Limited activity within patient's tolerance;Monitored during session;Repositioned    Home Living                      Prior Function            PT Goals (current goals can now be found in the care plan section) Progress towards PT goals: Not progressing toward goals - comment (HgB and lethargy limiting factors)    Frequency    7X/week      PT Plan Current plan remains appropriate    Co-evaluation              AM-PAC PT "6 Clicks" Mobility   Outcome Measure  Help needed turning from your back to your side while in a flat bed without using bedrails?: A Lot Help needed moving from lying on your back to sitting on the side of a flat bed without using bedrails?: A Lot Help needed moving to and from a bed to a chair (including a wheelchair)?: A Lot Help needed standing  up from a chair using your arms (e.g., wheelchair or bedside chair)?: A Lot Help needed to walk in hospital room?: Total Help needed climbing 3-5 steps with a railing? : Total 6 Click Score: 10    End of Session Equipment Utilized During Treatment: Oxygen Activity Tolerance: Patient limited by lethargy Patient left: in bed;with call bell/phone within reach;with bed alarm set Nurse Communication: Mobility status Pain - Right/Left: Right Pain - part of body: Hip     Time: 4758-3074 PT Time Calculation (min) (ACUTE ONLY): 8 min  Charges:  $Therapeutic Exercise: 8-22 mins                    Melissa George, PTA 01/26/20, 10:54 AM

## 2020-01-27 DIAGNOSIS — D649 Anemia, unspecified: Secondary | ICD-10-CM | POA: Diagnosis not present

## 2020-01-27 DIAGNOSIS — S72001D Fracture of unspecified part of neck of right femur, subsequent encounter for closed fracture with routine healing: Secondary | ICD-10-CM | POA: Diagnosis not present

## 2020-01-27 LAB — TYPE AND SCREEN
ABO/RH(D): A POS
Antibody Screen: NEGATIVE
Unit division: 0

## 2020-01-27 LAB — HEMOGLOBIN AND HEMATOCRIT, BLOOD
HCT: 27.7 % — ABNORMAL LOW (ref 36.0–46.0)
Hemoglobin: 8.7 g/dL — ABNORMAL LOW (ref 12.0–15.0)

## 2020-01-27 LAB — MAGNESIUM: Magnesium: 2.2 mg/dL (ref 1.7–2.4)

## 2020-01-27 LAB — BASIC METABOLIC PANEL
Anion gap: 7 (ref 5–15)
BUN: 20 mg/dL (ref 8–23)
CO2: 40 mmol/L — ABNORMAL HIGH (ref 22–32)
Calcium: 8.4 mg/dL — ABNORMAL LOW (ref 8.9–10.3)
Chloride: 97 mmol/L — ABNORMAL LOW (ref 98–111)
Creatinine, Ser: 0.66 mg/dL (ref 0.44–1.00)
GFR, Estimated: >60 mL/min (ref >60)
Glucose, Bld: 101 mg/dL — ABNORMAL HIGH (ref 70–99)
Potassium: 3.8 mmol/L (ref 3.5–5.1)
Sodium: 144 mmol/L (ref 135–145)

## 2020-01-27 LAB — BPAM RBC
Blood Product Expiration Date: 202111112359
ISSUE DATE / TIME: 202110171658
Unit Type and Rh: 6200

## 2020-01-27 NOTE — Progress Notes (Addendum)
Plan TIA and is difficult to glucose to the deformity lupus 47months the following day in the to be doing fully dehydrated because of marked in the following day Indicates he also may be to stop this and then continue so try to still trying to find a middle ground where    Progress Note    AUTUM George  ZOX:[id number] DOB: Sep 30, 1950  DOA: 01/21/2020 PCP: Remi Haggard, FNP      Brief Narrative:    Medical records reviewed and are as summarized below:  Melissa George is a 69 y.o. female with medical history significant for CAD, COPD, depression, anxiety, who presented to the hospital with acute onset of right hip pain following a mechanical fall at the group.  She was found to have right hip fracture.  She was treated with analgesics.  She was seen in consultation by the orthopedic surgeon and she underwent right hip hemiarthroplasty on 01/21/2020.  She developed AKI requiring treatment with IV fluids.      Assessment/Plan:   Principal Problem:   Closed fracture of neck of right femur (HCC) Active Problems:   COPD (chronic obstructive pulmonary disease) (HCC)   Depression with anxiety   S/P right hip fracture   AKI (acute kidney injury) (HCC)   Symptomatic anemia Altered mental status/acute toxic encephalopathy Acute kidney injury Acute blood loss anemia   Body mass index is 18.29 kg/m.    PLAN   H&H improved.  S/p transfusion with 1 unit of packed red blood cells on 01/26/2020 for acute blood loss anemia.  She tolerated blood transfusion without any complications.  Status post right hip hemiarthroplasty on 01/21/2020.  Analgesics as needed for pain.  Continue bronchodilators for COPD  Continue 4 L/min oxygen for chronic hypoxic and hypercapnic respiratory failure.  Continue Ativan and psychotropics for anxiety and depression  Legal guardian has opted for short-term rehab at [facility]  Follow-up with social worker to assist with  disposition.      Diet Order            Diet regular Room service appropriate? Yes; Fluid consistency: Thin  Diet effective now                    Consultants:  Orthopedic surgeon  Procedures:  Right hip hemiarthroplasty    Medications:   . sodium chloride   Intravenous Once  . sodium chloride   Intravenous Once  . budesonide  0.25 mg Nebulization BID  . busPIRone  5 mg Oral BID  . calcium carbonate  1 tablet Oral Daily  . docusate sodium  100 mg Oral BID  . escitalopram  20 mg Oral Daily  . ipratropium  0.5 mg Nebulization TID  . levalbuterol  0.63 mg Nebulization TID  . levothyroxine  50 mcg Oral Once per day on Mon Wed Fri  . levothyroxine  75 mcg Oral Once per day on Sun Tue Thu Sat  . linaclotide  72 mcg Oral QAC breakfast  . LORazepam  0.5 mg Oral TID  . mirtazapine  30 mg Oral QHS  . pantoprazole  40 mg Oral Daily  . perphenazine  8 mg Oral QHS  . polyethylene glycol  17 g Oral Daily  . theophylline  100 mg Oral QHS  . tiotropium  18 mcg Inhalation Daily   Continuous Infusions: .  ceFAZolin (ANCEF) IV       Anti-infectives (From admission, onward)   Start     Dose/Rate Route Frequency  Ordered Stop   01/21/20 2200  ceFAZolin (ANCEF) IVPB 1 g/50 mL premix        1 g 100 mL/hr over 30 Minutes Intravenous Every 6 hours 01/21/20 1745 01/22/20 1110   01/21/20 1519  ceFAZolin (ANCEF) 2-4 GM/100ML-% IVPB       Note to Pharmacy: Milinda Cave   : cabinet override      01/21/20 1519 01/22/20 0329   01/21/20 1517  ceFAZolin (ANCEF) 1-4 GM/50ML-% IVPB       Note to Pharmacy: Sharmon Leyden   : cabinet override      01/21/20 1517 01/22/20 2105   01/21/20 0924  ceFAZolin (ANCEF) IVPB 2g/100 mL premix        2 g 200 mL/hr over 30 Minutes Intravenous 30 min pre-op 01/21/20 0925               Family Communication/Anticipated D/C date and plan/Code Status   DVT prophylaxis: SCDs Start: 01/21/20 1746 Place and maintain sequential compression  device Start: 01/21/20 0704     Code Status: DNR  Family Communication: None Disposition Plan:    Status is: Inpatient  Remains inpatient appropriate because:Unsafe d/c plan   Dispo: The patient is from: Group home              Anticipated d/c is to: SNF              Anticipated d/c date is: 1 day              Patient currently is not medically stable to d/c.           Subjective:   She still c/o right hip pain. She feels a little better today  Objective:    Vitals:   01/26/20 1923 01/26/20 2001 01/26/20 2259 01/27/20 0719  BP:  124/68 (!) 123/50 130/75  Pulse:  92 91 82  Resp:  18 18 17   Temp:  97.7 F (36.5 C) 98.8 F (37.1 C) 98.3 F (36.8 C)  TempSrc:  Oral Oral Oral  SpO2: 98% 99% 98% 97%  Weight:      Height:       No data found.   Intake/Output Summary (Last 24 hours) at 01/27/2020 1408 Last data filed at 01/27/2020 1349 Gross per 24 hour  Intake 100.11 ml  Output 1375 ml  Net -1274.89 ml   Filed Weights   01/21/20 1513  Weight: 45.4 kg    Exam:   GEN: NAD SKIN: No rash EYES: EOMI ENT: MMM CV: RRR PULM: CTA B ABD: soft, ND, NT, +BS CNS: AAO x 3, non focal EXT: Right hip swelling and tenderness       .   Data Reviewed:   I have personally reviewed following labs and imaging studies:  Labs: Labs show the following:   Basic Metabolic Panel: Recent Labs  Lab 01/21/20 0525 01/21/20 0525 01/22/20 0409 01/22/20 0409 01/23/20 0258 01/23/20 0258 01/24/20 0252 01/24/20 0252 01/25/20 0435 01/25/20 0435 01/26/20 0518 01/27/20 0201  NA 141  --  140  --  140  --  142  --   --   --   --  144  K 3.2*   < > 4.0   < > 4.0   < > 3.8   < > 3.8   < > 3.5 3.8  CL 98  --  100  --  100  --  99  --   --   --   --  97*  CO2 36*  --  32  --  32  --  32  --   --   --   --  40*  GLUCOSE 145*  --  114*  --  105*  --  133*  --   --   --   --  101*  BUN 21  --  28*  --  25*  --  18  --   --   --   --  20  CREATININE 0.61  --   1.07*  --  0.80  --  0.60  --   --   --   --  0.66  CALCIUM 8.8*  --  8.1*  --  8.3*  --  8.5*  --   --   --   --  8.4*  MG 1.7   < > 1.6*  --  2.2  --   --   --  2.0  --  1.9 2.2   < > = values in this interval not displayed.   GFR Estimated Creatinine Clearance: 46.2 mL/min (by C-G formula based on SCr of 0.66 mg/dL). Liver Function Tests: Recent Labs  Lab 01/21/20 0247  AST 18  ALT 14  ALKPHOS 56  BILITOT 0.5  PROT 6.4*  ALBUMIN 3.8   No results for input(s): LIPASE, AMYLASE in the last 168 hours. No results for input(s): AMMONIA in the last 168 hours. Coagulation profile Recent Labs  Lab 01/21/20 0247  INR 0.9    CBC: Recent Labs  Lab 01/21/20 0247 01/21/20 0247 01/21/20 0525 01/21/20 0525 01/23/20 0258 01/24/20 0252 01/25/20 0435 01/26/20 0518 01/27/20 0549  WBC 12.4*   < > 13.9*  --  9.1 9.7 4.7 5.9  --   NEUTROABS 8.5*  --   --   --  7.4 8.2* 4.0  --   --   HGB 11.1*   < > 10.7*   < > 7.3* 7.2* 7.2* 6.9* 8.7*  HCT 34.6*   < > 33.0*   < > 22.9* 23.0* 22.6* 22.5* 27.7*  MCV 96.4   < > 96.2  --  99.1 99.1 97.8 99.6  --   PLT 195   < > 158  --  123* 128* 173 227  --    < > = values in this interval not displayed.   Cardiac Enzymes: No results for input(s): CKTOTAL, CKMB, CKMBINDEX, TROPONINI in the last 168 hours. BNP (last 3 results) No results for input(s): PROBNP in the last 8760 hours. CBG: No results for input(s): GLUCAP in the last 168 hours. D-Dimer: No results for input(s): DDIMER in the last 72 hours. Hgb A1c: No results for input(s): HGBA1C in the last 72 hours. Lipid Profile: No results for input(s): CHOL, HDL, LDLCALC, TRIG, CHOLHDL, LDLDIRECT in the last 72 hours. Thyroid function studies: No results for input(s): TSH, T4TOTAL, T3FREE, THYROIDAB in the last 72 hours.  Invalid input(s): FREET3 Anemia work up: No results for input(s): VITAMINB12, FOLATE, FERRITIN, TIBC, IRON, RETICCTPCT in the last 72 hours. Sepsis Labs: Recent Labs   Lab 01/23/20 0258 01/24/20 0252 01/25/20 0435 01/26/20 0518  WBC 9.1 9.7 4.7 5.9    Microbiology Recent Results (from the past 240 hour(s))  Urine culture     Status: None   Collection Time: 01/21/20  3:29 AM   Specimen: Urine, Random  Result Value Ref Range Status   Specimen Description   Final    URINE, RANDOM Performed at [facility], [address], [place], [place] [address]    Special Requests   Final    NONE Performed at [facility], [address], [place], [place] [address]    Culture   Final    NO GROWTH Performed at [facility], [place] [address] [place], [place] [address]    Report Status 01/22/2020 FINAL  Final  Respiratory Panel by RT PCR (Flu A&B, Covid) -     Status: None   Collection Time: 01/21/20  3:29 AM  Result Value Ref Range Status   SARS Coronavirus 2 by RT PCR NEGATIVE NEGATIVE Final    Comment: (NOTE) SARS-CoV-2 target nucleic acids are NOT DETECTED.  The SARS-CoV-2 RNA is generally detectable in upper respiratoy specimens during the acute phase of infection. The lowest concentration of SARS-CoV-2 viral copies this assay can detect is 131 copies/mL. A negative result does not preclude SARS-Cov-2 infection and should not be used as the sole basis for treatment or other patient management decisions. A negative result may occur with  improper specimen collection/handling, submission of specimen other than nasopharyngeal swab, presence of viral mutation(s) within the areas targeted by this assay, and inadequate number of viral copies (<131 copies/mL). A negative result must be combined with clinical observations, patient history, and epidemiological information. The expected result is Negative.  Fact Sheet for Patients:  PinkCheek.be  Fact Sheet for Healthcare Providers:  GravelBags.it  This test is no t yet approved or cleared by the [place] FDA and   has been authorized for detection and/or diagnosis of SARS-CoV-2 by FDA under an Emergency Use Authorization (EUA). This EUA will remain  in effect (meaning this test can be used) for the duration of the COVID-19 declaration under Section 564(b)(1) of the Act, 21 U.S.C. section 360bbb-3(b)(1), unless the authorization is terminated or revoked sooner.     Influenza A by PCR NEGATIVE NEGATIVE Final   Influenza B by PCR NEGATIVE NEGATIVE Final    Comment: (NOTE) The Xpert Xpress SARS-CoV-2/FLU/RSV assay is intended as an aid in  the diagnosis of influenza from Nasopharyngeal swab specimens and  should not be used as a sole basis for treatment. Nasal washings and  aspirates are unacceptable for Xpert Xpress SARS-CoV-2/FLU/RSV  testing.  Fact Sheet for Patients: PinkCheek.be  Fact Sheet for Healthcare Providers: GravelBags.it  This test is not yet approved or cleared by the [place] FDA and  has been authorized for detection and/or diagnosis of SARS-CoV-2 by  FDA under an Emergency Use Authorization (EUA). This EUA will remain  in effect (meaning this test can be used) for the duration of the  Covid-19 declaration under Section 564(b)(1) of the Act, 21  U.S.C. section 360bbb-3(b)(1), unless the authorization is  terminated or revoked. Performed at [facility], [address] [place],  [place] [address]     Procedures and diagnostic studies:  No results found.             LOS: 6 days   Zakeya Junker  [facility]   Pager on [email or web address]. If 7PM-7AM, please contact night-coverage at [email or web address]     01/27/2020, 2:08 PM

## 2020-01-27 NOTE — Progress Notes (Signed)
[facility] Liaison note:  Patient is currently followed by [facility] hosp[facility] services at [place] care home with a hospice diagnosis of emphysema. She is a DNR code with out of facility DNR in place in the home.  Patient was [facility] ED followingafall at her group homeHospice was not notified prior to transport. In the  ED right hip xray revealed anacute angulated subcapital femoral neck fracture. This is a related admission per hospice physician Dr. Gilford Rile.  Patient is now post op day 6 Chart notes reviewed. Patient was to have received 1 unit of blood on Friday 10/15, but the transfusion was stopped when she developed tachycardia and hypoxemia.  Patient is now s/p 1 unit of PRBC given yesterday 10/16 for continued drop in hemoglobin to 6.9. Hemoglobin up to 8.7 this morning. She has been able to work some with PT today. Plan chosen by DSS guardian per [facility] Jhonnie Garner is for short term rehab then return to her group home. Bed search has been initiated.  Visited patent  at bedside, she was more interactive with Probation officer today. Assisted patient with ordering dinner, appetite remains poor, encouraged fluids. Patient drank well during visit. She reported that [name] had visited and this brought her great happiness. She also requested tylenol for pain. Patient is also taking PRN liquid morphine, she has had 3 doses today.  Patient's Request for Tylenol reported to staff RN Patrice. Emotional support given.  Will continue to follow and update hospice team.  VS:98.2 oral, 106/62,83, 17, 97% on 4 liters of oxygen  I/O:220/675  Abnormal Labs: Chloride: 97 (L) CO2: 40 (H) Glucose: 101 (H) Calcium: 8.4 (L)Results for BENEDICTA, SULTAN (MRN [id number]) as of 01/27/2020 19:06  Hemoglobin: 8.7 (L) HCT: 27.7 (L)  No new diagnostics  IV/PRN Meds:  Acetaminophen 650mg  Q 6 hrs PRN, hydroxcyzine 25 mg Q 6 hrs PRN anxiety,Liquid Morphine  concentrate 5 mg Q 4 hrs PRN, Zofran 4mg  IV q 4 hrs PRN Nausea  Hospital problem List Principal Problem:   Closed fracture of neck of right femur (HCC) Active Problems:   COPD (chronic obstructive pulmonary disease) (HCC)   Depression with anxiety   S/P right hip fracture   AKI (acute kidney injury) ([place])   Symptomatic anemia Altered mental status/acute toxic encephalopathy Acute kidney injury Acute blood loss anemia Body mass index is 18.29 kg/m.  H&H improved.  S/p transfusion with 1 unit of packed red blood cells on 01/26/2020 for acute blood loss anemia.  She tolerated blood transfusion without any complications.  Status post right hip hemiarthroplasty on 01/21/2020.  Analgesics as needed for pain.  Continue bronchodilators for COPD  Continue 4 L/min oxygen for chronic hypoxic and hypercapnic respiratory failure.  Continue Ativan and psychotropics for anxiety and depression  Legal guardian has opted for short-term rehab at [facility].  Follow-up with social worker to assist with disposition.  Discharge Planning: On going Family Contact: message left for DSS guardian IDG: updated to plan for SNF, hospice social worker to contact [place] guardian regarding revocation of hospice benefit  Goals of care: DNR  Please call with any hospice related questions. Thank you Flo Shanks BSN, [place] [phone]

## 2020-01-27 NOTE — TOC Progression Note (Signed)
Transition of Care [facility]) - Progression Note    Patient Details  Name: [name] MRN: [id number] Date of Birth: January 19, 1951  Transition of Care [facility]) CM/SW [place], RN Phone Number: 01/27/2020, 10:14 AM  Clinical Narrative:   RNCM received return call from [place] with [place], patient's legal guardian to discuss placement. [name] reports that patient was previously relatively independent at baseline while at the group home. Discussed that patient's current status, and that mainly her therapy here has consisted of what can be done in the bed. After discussion [name] requests that SNF work up be started.  RNCM will verify PASSR, start FL2 and initiate bed search.          Expected Discharge Plan and Services                                                 Social Determinants of Health (SDOH) Interventions    Readmission Risk Interventions No flowsheet data found.

## 2020-01-27 NOTE — TOC Progression Note (Signed)
Transition of Care [facility]) - Progression Note    Patient Details  Name: Melissa George MRN: [id number] Date of Birth: 01-Mar-1951  Transition of Care [facility]) CM/SW [place], RN Phone Number: 01/27/2020, 8:29 AM  Clinical Narrative:   RNCM left VM for [name] Day DSS supervisor at [phone] to discuss their wishes as to whether patient will return to group home or if SNF work up should be completed.          Expected Discharge Plan and Services                                                 Social Determinants of Health (SDOH) Interventions    Readmission Risk Interventions No flowsheet data found.

## 2020-01-27 NOTE — NC FL2 (Signed)
[place] LEVEL OF CARE SCREENING TOOL     IDENTIFICATION  Patient Name: Melissa George Birthdate: 03/01/1951 Sex: female Admission Date (Current Location): 01/21/2020  [facility] and [place] Number:  Engineering geologist and Address:  [facility], [address], [place], [place] [address]      Provider Number: [id number]  Attending Physician Name and Address:  Jennye Boroughs, MD  Relative Name and Phone Number:  legal guardian Nigel Sloop [phone]    Current Level of Care: Hospital Recommended Level of Care: [place] Prior Approval Number:    Date Approved/Denied:   PASRR Number:    Discharge Plan: SNF    Current Diagnoses: Patient Active Problem List   Diagnosis Date Noted  . Symptomatic anemia 01/26/2020  . AKI (acute kidney injury) ([place]) 01/22/2020  . S/P right hip fracture 01/21/2020  . Closed fracture of neck of right femur ([place])   . DNR (do not resuscitate)   . Constipation   . Shortness of breath   . Goals of care, counseling/discussion   . Palliative care by specialist   . Lung nodule   . Hypothyroidism   . Depression with anxiety   . Hyperlipidemia   . COPD exacerbation ([place]) 09/26/2019  . Depression, major, recurrent, severe with psychosis ([place]) 08/01/2017  . Coronary artery disease 11/06/2014  . NSTEMI (non-ST elevated myocardial infarction) ([place]) 10/29/2014  . Protein-calorie malnutrition, severe ([place]) 10/29/2014  . Major depressive disorder, recurrent episode, severe with catatonia ([place]) 10/24/2014  . Delusional disorder, persecutory type ([place]) 09/24/2014  . COPD (chronic obstructive pulmonary disease) ([place]) 09/12/2014  . GAD (generalized anxiety disorder)     [place] RESPIRATION BLADDER Height & Weight     Self, Time, Situation, Place  Normal External catheter Weight: 45.4 kg Height:  5\' 2"  (157.5 cm)  BEHAVIORAL SYMPTOMS/MOOD NEUROLOGICAL BOWEL NUTRITION STATUS       Incontinent Diet (Regular)  AMBULATORY STATUS COMMUNICATION OF NEEDS Skin   Extensive Assist Verbally Surgical wounds                       Personal Care Assistance Level of Assistance  Bathing, Dressing, Feeding Bathing Assistance: Maximum assistance Feeding assistance: Limited assistance Dressing Assistance: Maximum assistance     Functional Limitations Info  Hearing   Hearing Info: Impaired (Hard of hearing)      SPECIAL CARE FACTORS FREQUENCY  PT (By licensed PT), OT (By licensed OT)                    Contractures Contractures Info: Not present    Additional Factors Info  Code Status, Allergies Code Status Info: DNR Allergies Info: Ciprofloxacin, Levofloxacin, Morphine, Sulfa, Symbicort, Advair, Nickel           Current Medications (01/27/2020):  This is the current hospital active medication list Current Facility-Administered Medications  Medication Dose Route Frequency Provider Last Rate Last Admin  . 0.9 %  sodium chloride infusion (Manually program via Guardrails IV Fluids)   Intravenous Once Jennye Boroughs, MD   Stopped at 01/24/20 1852  . 0.9 %  sodium chloride infusion (Manually program via Guardrails IV Fluids)   Intravenous Once Jennye Boroughs, MD      . acetaminophen (TYLENOL) tablet 650 mg  650 mg Oral Q6H PRN Lovell Sheehan, MD   650 mg at 01/26/20 2043   Or  . acetaminophen (TYLENOL) suppository 650 mg  650 mg Rectal Q6H PRN Kurtis Bushman  R, MD      . budesonide (PULMICORT) nebulizer solution 0.25 mg  0.25 mg Nebulization BID Lovell Sheehan, MD   0.25 mg at 01/27/20 0804  . busPIRone (BUSPAR) tablet 5 mg  5 mg Oral BID Lovell Sheehan, MD   5 mg at 01/27/20 7062  . calcium carbonate (TUMS - dosed in mg elemental calcium) chewable tablet 200 mg of elemental calcium  1 tablet Oral Daily Lovell Sheehan, MD   200 mg of elemental calcium at 01/27/20 0929  . ceFAZolin (ANCEF) IVPB 2g/100 mL premix  2 g Intravenous 30 min Pre-Op Lovell Sheehan,  MD      . docusate sodium (COLACE) capsule 100 mg  100 mg Oral BID Lovell Sheehan, MD   100 mg at 01/27/20 0929  . escitalopram (LEXAPRO) tablet 20 mg  20 mg Oral Daily Lovell Sheehan, MD   20 mg at 01/27/20 3762  . HYDROmorphone (DILAUDID) injection 0.5 mg  0.5 mg Intravenous Q4H PRN Jennye Boroughs, MD   0.5 mg at 01/24/20 0309  . hydrOXYzine (ATARAX/VISTARIL) tablet 25 mg  25 mg Oral Q6H PRN Jennye Boroughs, MD   25 mg at 01/27/20 0525  . ipratropium (ATROVENT) nebulizer solution 0.5 mg  0.5 mg Nebulization TID Jennye Boroughs, MD   0.5 mg at 01/27/20 0804  . ipratropium (ATROVENT) nebulizer solution 0.5 mg  0.5 mg Nebulization Q6H PRN Jennye Boroughs, MD      . levalbuterol Penne Lash) nebulizer solution 0.63 mg  0.63 mg Nebulization TID Jennye Boroughs, MD   0.63 mg at 01/27/20 0804  . levalbuterol (XOPENEX) nebulizer solution 0.63 mg  0.63 mg Nebulization Q6H PRN Jennye Boroughs, MD      . levothyroxine (SYNTHROID) tablet 50 mcg  50 mcg Oral Once per day on Mon Wed Fri Lovell Sheehan, MD   50 mcg at 01/27/20 0525  . levothyroxine (SYNTHROID) tablet 75 mcg  75 mcg Oral Once per day on Sun Tue Thu Sat Lovell Sheehan, MD   75 mcg at 01/26/20 0645  . linaclotide (LINZESS) capsule 72 mcg  72 mcg Oral QAC breakfast Lovell Sheehan, MD   72 mcg at 01/27/20 0524  . LORazepam (ATIVAN) tablet 0.5 mg  0.5 mg Oral TID Jennye Boroughs, MD   0.5 mg at 01/27/20 0928  . magnesium hydroxide (MILK OF MAGNESIA) suspension 30 mL  30 mL Oral Daily PRN Lovell Sheehan, MD      . magnesium hydroxide (MILK OF MAGNESIA) suspension 30 mL  30 mL Oral Daily PRN Lovell Sheehan, MD      . metoCLOPramide (REGLAN) tablet 5-10 mg  5-10 mg Oral Q8H PRN Lovell Sheehan, MD   10 mg at 01/27/20 0930   Or  . metoCLOPramide (REGLAN) injection 5-10 mg  5-10 mg Intravenous Q8H PRN Lovell Sheehan, MD      . mirtazapine (REMERON) tablet 30 mg  30 mg Oral QHS Lovell Sheehan, MD   30 mg at 01/26/20 2036  . morphine CONCENTRATE 10  MG/0.5ML oral solution 5 mg  5 mg Sublingual Q4H PRN Lovell Sheehan, MD   5 mg at 01/27/20 8315  . ondansetron (ZOFRAN) injection 4 mg  4 mg Intravenous Q4H PRN Lovell Sheehan, MD   4 mg at 01/27/20 0930  . ondansetron (ZOFRAN-ODT) disintegrating tablet 4 mg  4 mg Oral Q8H PRN Lovell Sheehan, MD   4 mg at 01/26/20 2035  . pantoprazole (PROTONIX)  EC tablet 40 mg  40 mg Oral Daily Lovell Sheehan, MD   40 mg at 01/27/20 4276  . perphenazine (TRILAFON) tablet 8 mg  8 mg Oral QHS Lovell Sheehan, MD   8 mg at 01/26/20 2038  . polyethylene glycol (MIRALAX / GLYCOLAX) packet 17 g  17 g Oral Daily Lovell Sheehan, MD   17 g at 01/27/20 0931  . sodium phosphate (FLEET) 7-19 GM/118ML enema 1 enema  1 enema Rectal Once PRN Lovell Sheehan, MD      . theophylline (THEO-24) 24 hr capsule 100 mg  100 mg Oral QHS Lovell Sheehan, MD   100 mg at 01/26/20 2035  . tiotropium (SPIRIVA) inhalation capsule ([facility] use ONLY) 18 mcg  18 mcg Inhalation Daily Lovell Sheehan, MD   18 mcg at 01/27/20 7011  . traZODone (DESYREL) tablet 25 mg  25 mg Oral QHS PRN Lovell Sheehan, MD   25 mg at 01/21/20 2034  . zolpidem (AMBIEN) tablet 5 mg  5 mg Oral QHS PRN Lovell Sheehan, MD   5 mg at 01/23/20 2116     Discharge Medications: Please see discharge summary for a list of discharge medications.  Relevant Imaging Results:  Relevant Lab Results:   Additional Information SS# [id number]  Shelbie Ammons, RN

## 2020-01-27 NOTE — Consult Note (Signed)
[facility] for Electrolyte Monitoring and Replacement   Recent Labs: Potassium (mmol/L)  Date Value  01/27/2020 3.8  03/02/2014 3.3 (L)   Magnesium (mg/dL)  Date Value  01/27/2020 2.2  01/23/2014 2.2   Calcium (mg/dL)  Date Value  01/27/2020 8.4 (L)   Calcium, Total (mg/dL)  Date Value  03/02/2014 8.8   Albumin (g/dL)  Date Value  01/21/2020 3.8  03/02/2014 3.6   Phosphorus (mg/dL)  Date Value  10/07/2014 3.4   Sodium (mmol/L)  Date Value  01/27/2020 144  03/02/2014 141    Assessment: Patient is a 69 y/o F with medical history including COPD, CAD, anxiety who presented to the ED 10/12 with R hip pain after mechanical fall. Patient found to have R hip fracture. Ortho consulted and plan is for R hip hemiarthroplasty 10/12.    Goal of Therapy:  Electrolytes within normal limits  Plan:  K 3.8  Mag 2.2 No replacement warranted at this time.  --Re-check electrolytes with AM labs and continue to monitor.  Pernell Dupre, PharmD, BCPS Clinical Pharmacist 01/27/2020 8:08 AM

## 2020-01-27 NOTE — Progress Notes (Signed)
Physical Therapy Treatment Patient Details Name: TAKEIRA YANES MRN: [id number] DOB: 10-11-1950 Today's Date: 01/27/2020    History of Present Illness Gabrelle Roca is a 69 y/o female who underwent R anterior hip hemiarthroplasty after sustaining a mechanical fall. 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    PT Comments    Pt awake and ready for session.  "I'll try."  Pt is able to transition to/from sitting with max a x 1.  She requires close supervision for sitting but no physical assist to remain upright.  Seated AROM to tolerance.  She does report some dizziness upon sitting 4/10 which increases to 9/10 over 4-5 minutes.  She is returned to supine.  BP taken 130/51 P 90.  Dizziness resolving with time. Would benefit from orthostatics next session.   Follow Up Recommendations  SNF     Equipment Recommendations  Rolling walker with 5" wheels;3in1 (PT)    Recommendations for Other Services       Precautions / Restrictions Precautions Precautions: Fall Restrictions Weight Bearing Restrictions: Yes RLE Weight Bearing: Weight bearing as tolerated    Mobility  Bed Mobility Overal bed mobility: Needs Assistance Bed Mobility: Supine to Sit;Sit to Supine     Supine to sit: Max assist Sit to supine: Max assist      Transfers                    Ambulation/Gait                 Stairs             Wheelchair Mobility    Modified Rankin (Stroke Patients Only)       Balance Overall balance assessment: Needs assistance Sitting-balance support: Single extremity supported Sitting balance-Leahy Scale: Fair Sitting balance - Comments: able to sit unsupported but needs supevsion for safety/dizziness       Standing balance comment: not performed                             Cognition Arousal/Alertness: Awake/alert Behavior During Therapy: WFL for tasks assessed/performed Overall  Cognitive Status: Within Functional Limits for tasks assessed                                        Exercises Other Exercises Other Exercises: seated LAQ BLE x 10, ankle pumps    General Comments        Pertinent Vitals/Pain Pain Assessment: No/denies pain    Home Living                      Prior Function            PT Goals (current goals can now be found in the care plan section) Progress towards PT goals: Progressing toward goals    Frequency    7X/week      PT Plan Current plan remains appropriate    Co-evaluation              AM-PAC PT "6 Clicks" Mobility   Outcome Measure  Help needed turning from your back to your side while in a flat bed without using bedrails?: A Lot Help needed moving from lying on your back to sitting on the side of a flat bed without using bedrails?: A Lot Help  needed moving to and from a bed to a chair (including a wheelchair)?: A Lot Help needed standing up from a chair using your arms (e.g., wheelchair or bedside chair)?: A Lot Help needed to walk in hospital room?: Total Help needed climbing 3-5 steps with a railing? : Total 6 Click Score: 10    End of Session Equipment Utilized During Treatment: Oxygen Activity Tolerance: Patient tolerated treatment well;Treatment limited secondary to medical complications (Comment) Patient left: in bed;with call bell/phone within reach;with bed alarm set Nurse Communication: Mobility status Pain - Right/Left: Right Pain - part of body: Hip     Time: 7544-9201 PT Time Calculation (min) (ACUTE ONLY): 15 min  Charges:  $Therapeutic Activity: 8-22 mins                    Chesley Noon, PTA 01/27/20, 1:06 PM

## 2020-01-28 DIAGNOSIS — S72001D Fracture of unspecified part of neck of right femur, subsequent encounter for closed fracture with routine healing: Secondary | ICD-10-CM | POA: Diagnosis not present

## 2020-01-28 LAB — MAGNESIUM: Magnesium: 2.1 mg/dL (ref 1.7–2.4)

## 2020-01-28 LAB — POTASSIUM: Potassium: 4.1 mmol/L (ref 3.5–5.1)

## 2020-01-28 MED ORDER — ENOXAPARIN SODIUM 40 MG/0.4ML ~~LOC~~ SOLN
40.0000 mg | SUBCUTANEOUS | Status: DC
  Administered 2020-01-28 – 2020-01-29 (×2): 40 mg via SUBCUTANEOUS
  Filled 2020-01-28 (×2): qty 0.4

## 2020-01-28 MED ORDER — FLEET ENEMA 7-19 GM/118ML RE ENEM
1.0000 | ENEMA | Freq: Once | RECTAL | Status: AC
  Administered 2020-01-28: 1 via RECTAL

## 2020-01-28 NOTE — Progress Notes (Signed)
Physical Therapy Treatment Patient Details Name: Melissa George MRN: [id number] DOB: Aug 28, 1950 Today's Date: 01/28/2020    History of Present Illness Melissa George is a 69 y/o female who underwent R anterior hip hemiarthroplasty after sustaining a mechanical fall. 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    PT Comments    Patient is agreeable to PT and is progressing towards PT goals. Patient required Mod A for bed mobility for occasional trunk and RLE support with cues for technique. Mod A for sit to stand transfer from bed with cues for hand placement and safety using rolling walker. Patient sat up on edge of bed for ~ 7 minutes with SBA for safety with unilateral or bilateral UE support with activity tolerance limited by fatigue. Patient ambulated 2 ft with rolling walker with Min A with activity tolerance limited by fatigue and mild right hip pain reported with ambulation. Recommend to continue PT to maximize independence and address remaining functional limitations. SNF remains appropriate discharge plan.     Follow Up Recommendations  SNF     Equipment Recommendations  Rolling walker with 5" wheels;3in1 (PT)    Recommendations for Other Services       Precautions / Restrictions Precautions Precautions: Fall Restrictions Weight Bearing Restrictions: Yes RLE Weight Bearing: Weight bearing as tolerated    Mobility  Bed Mobility Overal bed mobility: Needs Assistance Bed Mobility: Supine to Sit;Sit to Supine Rolling: Min guard (for rolling to left side for bed pan placement )   Supine to sit: Mod assist Sit to supine: Mod assist   General bed mobility comments: intermittent assistance for RLE support and for upright trunk support. verbal cues for technique and safety   Transfers Overall transfer level: Needs assistance Equipment used: Rolling walker (2 wheeled) Transfers: Sit to/from Stand Sit to Stand: Mod  assist         General transfer comment: lifting and lowering assistance provided with standing. verbal cues for technique, hand placement   Ambulation/Gait Ambulation/Gait assistance: Min assist Gait Distance (Feet): 2 Feet Assistive device: Rolling walker (2 wheeled) Gait Pattern/deviations: Narrow base of support;Decreased step length - right;Step-to pattern Gait velocity: decreased   General Gait Details: verbal cues for techniques using rolling walker. steadying assistance provided for safety. further mobility declined due to mild right hip pain with activity and fatigue    Stairs             Wheelchair Mobility    Modified Rankin (Stroke Patients Only)       Balance Overall balance assessment: Needs assistance Sitting-balance support: Single extremity supported Sitting balance-Leahy Scale: Fair     Standing balance support: Bilateral upper extremity supported Standing balance-Leahy Scale: Fair Standing balance comment: patient relying on rolling walker for UE support in standing position                             Cognition Arousal/Alertness: Awake/alert Behavior During Therapy: Flat affect Overall Cognitive Status: Within Functional Limits for tasks assessed                                        Exercises Total Joint Exercises Long Arc Quad: AAROM;Strengthening;Both;10 reps;Seated Other Exercises Other Exercises: verbal cues for exercise technique     General Comments        Pertinent Vitals/Pain Pain  Assessment: Faces Faces Pain Scale: Hurts a little bit Pain Location: mild pain reported right hip with standing only, otherwise no pain reported  Pain Descriptors / Indicators: Sore Pain Intervention(s): Monitored during session    Home Living                      Prior Function            PT Goals (current goals can now be found in the care plan section) Acute Rehab PT Goals Patient Stated Goal:  none stated  PT Goal Formulation: With patient Time For Goal Achievement: 02/05/20 Potential to Achieve Goals: Fair Progress towards PT goals: Progressing toward goals    Frequency    7X/week      PT Plan Current plan remains appropriate    Co-evaluation              AM-PAC PT "6 Clicks" Mobility   Outcome Measure  Help needed turning from your back to your side while in a flat bed without using bedrails?: A Lot Help needed moving from lying on your back to sitting on the side of a flat bed without using bedrails?: A Lot Help needed moving to and from a bed to a chair (including a wheelchair)?: A Lot Help needed standing up from a chair using your arms (e.g., wheelchair or bedside chair)?: A Lot Help needed to walk in hospital room?: A Lot Help needed climbing 3-5 steps with a railing? : Total 6 Click Score: 11    End of Session Equipment Utilized During Treatment: Oxygen Activity Tolerance: Patient limited by fatigue Patient left: in bed;with call bell/phone within reach;with bed alarm set Nurse Communication: Mobility status PT Visit Diagnosis: Unsteadiness on feet (R26.81);Other abnormalities of gait and mobility (R26.89);Muscle weakness (generalized) (M62.81);Repeated falls (R29.6);History of falling (Z91.81);Pain Pain - Right/Left: Right Pain - part of body: Hip     Time: 1500-1540 PT Time Calculation (min) (ACUTE ONLY): 40 min  Charges:  $Therapeutic Activity: 38-52 mins                     [name], PT, MPT   Percell Locus 01/28/2020, 3:53 PM

## 2020-01-28 NOTE — Consult Note (Signed)
[facility] for Electrolyte Monitoring and Replacement   Recent Labs: Potassium (mmol/L)  Date Value  01/28/2020 4.1  03/02/2014 3.3 (L)   Magnesium (mg/dL)  Date Value  01/28/2020 2.1  01/23/2014 2.2   Calcium (mg/dL)  Date Value  01/27/2020 8.4 (L)   Calcium, Total (mg/dL)  Date Value  03/02/2014 8.8   Albumin (g/dL)  Date Value  01/21/2020 3.8  03/02/2014 3.6   Phosphorus (mg/dL)  Date Value  10/07/2014 3.4   Sodium (mmol/L)  Date Value  01/27/2020 144  03/02/2014 141    Assessment: Patient is a 69 y/o F with medical history including COPD, CAD, anxiety who presented to the ED 10/12 with R hip pain after mechanical fall. Patient found to have R hip fracture. Ortho consulted and plan is for R hip hemiarthroplasty 10/12.    Goal of Therapy:  Electrolytes within normal limits  Plan:  K 4.1  Mag 2.1 No replacement warranted at this time (x 2 days).  --Re-check electrolytes with AM labs and continue to monitor.  Rowland Lathe, PharmD Clinical Pharmacist 01/28/2020 7:52 AM

## 2020-01-28 NOTE — TOC Progression Note (Signed)
Transition of Care [facility]) - Progression Note    Patient Details  Name: Melissa George MRN: [id number] Date of Birth: [birth year]  Transition of [facility]) CM/SW [place], RN Phone Number: 01/28/2020, 3:53 PM  Clinical Narrative:   RNCM extended bed search into [place]          Expected Discharge Plan and Services                                                 Social Determinants of Health (SDOH) Interventions    Readmission Risk Interventions No flowsheet data found.

## 2020-01-28 NOTE — Progress Notes (Addendum)
Progress Note    Melissa George  KYH:[id number] DOB: [birth year]  DOA: 01/21/2020 PCP: Remi Haggard, FNP      Brief Narrative:    Medical records reviewed and are as summarized below:  Melissa George is a 69 y.o. female with medical history significant for CAD, COPD, chronic hypoxemic respiratory failure 4 L/min oxygen, depression, anxiety, who presented to the hospital with acute onset of right hip pain following a mechanical fall at the group.  She was found to have right hip fracture.  She was treated with analgesics.  She was seen in consultation by the orthopedic surgeon and she underwent right hip hemiarthroplasty on 01/21/2020.  She developed AKI requiring treatment with IV fluids.  She also developed acute blood loss anemia requiring transfusion with 1 unit of packed red blood cells.  Hospital course was complicated by episodes of altered mental status suspected to be due to toxic encephalopathy from opioids.  Her legal guardian with DSS, Ms. Lollie Sails, was contemplating discharge to SNF for short-term rehab versus discharge to group home with hospice.  Legal guardian has opted for discharge to SNF for short-term rehab.      Assessment/Plan:   Principal Problem:   Closed fracture of neck of right femur (HCC) Active Problems:   COPD (chronic obstructive pulmonary disease) (HCC)   Depression with anxiety   S/P right hip fracture   AKI (acute kidney injury) (HCC)   Symptomatic anemia Altered mental status/acute toxic encephalopathy Acute kidney injury Acute blood loss anemia   Body mass index is 18.29 kg/m.    PLAN  H&H improved. s/p transfusion with 1 unit of packed red blood cells on 01/26/2020 for acute postoperative blood loss anemia.  S/p right hip hemiarthroplasty on 01/21/2020 for right hip fracture.  Analgesics as needed for pain.  Dr. Harlow Mares, orthopedic surgeon, recommended resuming Lovenox and continuing for 14 days.  (Discussed via  secure chart)  Continue bronchodilators for COPD  Continue 4 L/min oxygen for chronic hypoxic and hypercapnic respiratory failure  Continue Ativan and psychotropics for anxiety and depression.  Constipation: Continue laxatives  Legal guardian DSS has opted for short-term rehab at [facility].  Awaiting placement to SNF.       Diet Order            Diet regular Room service appropriate? Yes; Fluid consistency: Thin  Diet effective now                    Consultants:  Orthopedic surgeon  Procedures:  Right hip hemiarthroplasty    Medications:   . sodium chloride   Intravenous Once  . sodium chloride   Intravenous Once  . budesonide  0.25 mg Nebulization BID  . busPIRone  5 mg Oral BID  . calcium carbonate  1 tablet Oral Daily  . docusate sodium  100 mg Oral BID  . enoxaparin (LOVENOX) injection  40 mg Subcutaneous Q24H  . escitalopram  20 mg Oral Daily  . ipratropium  0.5 mg Nebulization TID  . levalbuterol  0.63 mg Nebulization TID  . levothyroxine  50 mcg Oral Once per day on Mon Wed Fri  . levothyroxine  75 mcg Oral Once per day on Sun Tue Thu Sat  . linaclotide  72 mcg Oral QAC breakfast  . LORazepam  0.5 mg Oral TID  . mirtazapine  30 mg Oral QHS  . pantoprazole  40 mg Oral Daily  . perphenazine  8 mg Oral QHS  .  polyethylene glycol  17 g Oral Daily  . theophylline  100 mg Oral QHS  . tiotropium  18 mcg Inhalation Daily   Continuous Infusions: .  ceFAZolin (ANCEF) IV       Anti-infectives (From admission, onward)   Start     Dose/Rate Route Frequency Ordered Stop   01/21/20 2200  ceFAZolin (ANCEF) IVPB 1 g/50 mL premix        1 g 100 mL/hr over 30 Minutes Intravenous Every 6 hours 01/21/20 1745 01/22/20 1110   01/21/20 1519  ceFAZolin (ANCEF) 2-4 GM/100ML-% IVPB       Note to Pharmacy: Milinda Cave   : cabinet override      01/21/20 1519 01/22/20 0329   01/21/20 1517  ceFAZolin (ANCEF) 1-4 GM/50ML-% IVPB       Note to Pharmacy: Sharmon Leyden   : cabinet override      01/21/20 1517 01/22/20 2105   01/21/20 0924  ceFAZolin (ANCEF) IVPB 2g/100 mL premix        2 g 200 mL/hr over 30 Minutes Intravenous 30 min pre-op 01/21/20 8338               Family Communication/Anticipated D/C date and plan/Code Status   DVT prophylaxis: enoxaparin (LOVENOX) injection 40 mg Start: 01/28/20 2200 SCDs Start: 01/21/20 1746 Place and maintain sequential compression device Start: 01/21/20 0704     Code Status: DNR  Family Communication: None Disposition Plan:    Status is: Inpatient  Remains inpatient appropriate because:Unsafe d/c plan   Dispo: The patient is from: Group home              Anticipated d/c is to: SNF              Anticipated d/c date is: 1 day              Patient currently is not medically stable to d/c.           Subjective:   She complains of "fatigue".  Right hip pain is better today.  Interval events noted.  Patient has been constipated for a while.  Objective:    Vitals:   01/27/20 2031 01/27/20 2228 01/28/20 0853 01/28/20 0938  BP:  (!) 116/54 115/75   Pulse:  79 81   Resp:  17 16   Temp:  98.5 F (36.9 C)    TempSrc:  Oral    SpO2: 97% 96% 95% 94%  Weight:      Height:       No data found.   Intake/Output Summary (Last 24 hours) at 01/28/2020 1526 Last data filed at 01/28/2020 0100 Gross per 24 hour  Intake --  Output 900 ml  Net -900 ml   Filed Weights   01/21/20 1513  Weight: 45.4 kg    Exam:  GEN: NAD SKIN: No rash EYES: EOMI ENT: MMM CV: RRR PULM: CTA B ABD: soft, ND, NT, +BS CNS: AAO x 3, non focal EXT: Mild right hip swelling and tenderness.  Right hip surgical wound with dressing.        .   Data Reviewed:   I have personally reviewed following labs and imaging studies:  Labs: Labs show the following:   Basic Metabolic Panel: Recent Labs  Lab 01/22/20 0409 01/22/20 0409 01/23/20 0258 01/23/20 0258 01/24/20 0252  01/24/20 0252 01/25/20 0435 01/25/20 0435 01/26/20 0518 01/26/20 0518 01/27/20 0201 01/28/20 0309  NA 140  --  140  --  142  --   --   --   --   --  144  --   K 4.0   < > 4.0   < > 3.8   < > 3.8   < > 3.5   < > 3.8 4.1  CL 100  --  100  --  99  --   --   --   --   --  97*  --   CO2 32  --  32  --  32  --   --   --   --   --  40*  --   GLUCOSE 114*  --  105*  --  133*  --   --   --   --   --  101*  --   BUN 28*  --  25*  --  18  --   --   --   --   --  20  --   CREATININE 1.07*  --  0.80  --  0.60  --   --   --   --   --  0.66  --   CALCIUM 8.1*  --  8.3*  --  8.5*  --   --   --   --   --  8.4*  --   MG 1.6*   < > 2.2  --   --   --  2.0  --  1.9  --  2.2 2.1   < > = values in this interval not displayed.   GFR Estimated Creatinine Clearance: 46.2 mL/min (by C-G formula based on SCr of 0.66 mg/dL). Liver Function Tests: No results for input(s): AST, ALT, ALKPHOS, BILITOT, PROT, ALBUMIN in the last 168 hours. No results for input(s): LIPASE, AMYLASE in the last 168 hours. No results for input(s): AMMONIA in the last 168 hours. Coagulation profile No results for input(s): INR, PROTIME in the last 168 hours.  CBC: Recent Labs  Lab 01/23/20 0258 01/24/20 0252 01/25/20 0435 01/26/20 0518 01/27/20 0549  WBC 9.1 9.7 4.7 5.9  --   NEUTROABS 7.4 8.2* 4.0  --   --   HGB 7.3* 7.2* 7.2* 6.9* 8.7*  HCT 22.9* 23.0* 22.6* 22.5* 27.7*  MCV 99.1 99.1 97.8 99.6  --   PLT 123* 128* 173 227  --    Cardiac Enzymes: No results for input(s): CKTOTAL, CKMB, CKMBINDEX, TROPONINI in the last 168 hours. BNP (last 3 results) No results for input(s): PROBNP in the last 8760 hours. CBG: No results for input(s): GLUCAP in the last 168 hours. D-Dimer: No results for input(s): DDIMER in the last 72 hours. Hgb A1c: No results for input(s): HGBA1C in the last 72 hours. Lipid Profile: No results for input(s): CHOL, HDL, LDLCALC, TRIG, CHOLHDL, LDLDIRECT in the last 72 hours. Thyroid function  studies: No results for input(s): TSH, T4TOTAL, T3FREE, THYROIDAB in the last 72 hours.  Invalid input(s): FREET3 Anemia work up: No results for input(s): VITAMINB12, FOLATE, FERRITIN, TIBC, IRON, RETICCTPCT in the last 72 hours. Sepsis Labs: Recent Labs  Lab 01/23/20 0258 01/24/20 0252 01/25/20 0435 01/26/20 0518  WBC 9.1 9.7 4.7 5.9    Microbiology Recent Results (from the past 240 hour(s))  Urine culture     Status: None   Collection Time: 01/21/20  3:29 AM   Specimen: Urine, Random  Result Value Ref Range Status   Specimen Description   Final    URINE, RANDOM Performed at [facility], [address], [place], [place] [address]    Special Requests   Final  NONE Performed at [facility], [address], [place], [place] [address]    Culture   Final    NO GROWTH Performed at [facility], [place] [address] [place], [place] [address]    Report Status 01/22/2020 FINAL  Final  Respiratory Panel by RT PCR (Flu A&B, Covid) -     Status: None   Collection Time: 01/21/20  3:29 AM  Result Value Ref Range Status   SARS Coronavirus 2 by RT PCR NEGATIVE NEGATIVE Final    Comment: (NOTE) SARS-CoV-2 target nucleic acids are NOT DETECTED.  The SARS-CoV-2 RNA is generally detectable in upper respiratoy specimens during the acute phase of infection. The lowest concentration of SARS-CoV-2 viral copies this assay can detect is 131 copies/mL. A negative result does not preclude SARS-Cov-2 infection and should not be used as the sole basis for treatment or other patient management decisions. A negative result may occur with  improper specimen collection/handling, submission of specimen other than nasopharyngeal swab, presence of viral mutation(s) within the areas targeted by this assay, and inadequate number of viral copies (<131 copies/mL). A negative result must be combined with clinical observations, patient history, and epidemiological information.  The expected result is Negative.  Fact Sheet for Patients:  PinkCheek.be  Fact Sheet for Healthcare Providers:  GravelBags.it  This test is no t yet approved or cleared by the [place] FDA and  has been authorized for detection and/or diagnosis of SARS-CoV-2 by FDA under an Emergency Use Authorization (EUA). This EUA will remain  in effect (meaning this test can be used) for the duration of the COVID-19 declaration under Section 564(b)(1) of the Act, 21 U.S.C. section 360bbb-3(b)(1), unless the authorization is terminated or revoked sooner.     Influenza A by PCR NEGATIVE NEGATIVE Final   Influenza B by PCR NEGATIVE NEGATIVE Final    Comment: (NOTE) The Xpert Xpress SARS-CoV-2/FLU/RSV assay is intended as an aid in  the diagnosis of influenza from Nasopharyngeal swab specimens and  should not be used as a sole basis for treatment. Nasal washings and  aspirates are unacceptable for Xpert Xpress SARS-CoV-2/FLU/RSV  testing.  Fact Sheet for Patients: PinkCheek.be  Fact Sheet for Healthcare Providers: GravelBags.it  This test is not yet approved or cleared by the [place] FDA and  has been authorized for detection and/or diagnosis of SARS-CoV-2 by  FDA under an Emergency Use Authorization (EUA). This EUA will remain  in effect (meaning this test can be used) for the duration of the  Covid-19 declaration under Section 564(b)(1) of the Act, 21  U.S.C. section 360bbb-3(b)(1), unless the authorization is  terminated or revoked. Performed at [facility], [address] [place], [place] [address]     Procedures and diagnostic studies:  No results found.             LOS: 7 days   Hailley Byers  [facility]   Pager on [email or web address]. If 7PM-7AM, please contact night-coverage at [email or web address]     01/28/2020, 3:26 PM

## 2020-01-28 NOTE — Progress Notes (Signed)
[facility] Liaison note:  Patient is currently followed by [facility] services at [place] [facility] with a hospice diagnosis of emphysema. She is a DNR code with out of facility DNR in place in the home.  Patient was sent to the [facility] ED followingafall at her group homeHospice was not notified prior to transport. In the  ED right hip xray revealed anacute angulated subcapital femoral neck fracture. This is a related admission per hospice physician Dr. Gilford Rile.  Visited patient at bedside, she was alert and quietly interactive, reports pain has improved, she did have one dose of liquid morphine 5 mg this morning. Her appetite remains poor. IV fluids have been discontinued. No bowel movement since admission, she is taking her miralax daily and colace. Discussed with staff RN Anderson Malta. Plan is for Fleets enema.. Plan continues for discharge to skilled nursing for short term rehab, awaiting bed offers and DSS choice. Will continue to follow and provide emotional support and assist with discharge planning.  VS: 115/75, 81, 16, 95% on 4 liters   I/O: no intake charted, patient is drinking some with meals, fluids encouraged/1600  Abnormal labs:no new labs No new diagnostics  IV/PRN Meds: Morphine sulfate 5 mg q 4 hrs PRN  Hospital Problem list: Principal Problem:   Closed fracture of neck of right femur ([place]) Active Problems:   COPD (chronic obstructive pulmonary disease) (HCC)   Depression with anxiety   S/P right hip fracture   AKI (acute kidney injury) (HCC)   Symptomatic anemia Altered mental status/acute toxic encephalopathy Acute kidney injury Acute blood loss anemia Body mass index is 18.29 kg/m.   PLAN  H&H improved. s/p transfusion with 1 unit of packed red blood cells on 01/26/2020 for acute postoperative blood loss anemia.  S/p right hip hemiarthroplasty on 01/21/2020 for right hip fracture.  Analgesics as needed  for pain.  Continue bronchodilators for COPD  Continue 4 L/min oxygen for chronic hypoxic and hypercapnic respiratory failure  Continue Ativan and psychotropics for anxiety and depression.  Constipation: Continue laxatives  Legal guardian DSS has opted for short-term rehab at [facility].  Awaiting placement to SNF.  Discharge Planning: On going Family Contact: none today, still awaiting call back from DSS case worker IDG: updated to plan for SNF, hospice social worker to contact [place] guardian regarding revocation of hospice benefit  Goals of care: DNR Please call with any hospice related questions. Thank you.  Flo Shanks BSN, RN, [place] [phone]

## 2020-01-29 DIAGNOSIS — N179 Acute kidney failure, unspecified: Secondary | ICD-10-CM | POA: Diagnosis not present

## 2020-01-29 DIAGNOSIS — J449 Chronic obstructive pulmonary disease, unspecified: Secondary | ICD-10-CM | POA: Diagnosis not present

## 2020-01-29 DIAGNOSIS — F418 Other specified anxiety disorders: Secondary | ICD-10-CM | POA: Diagnosis not present

## 2020-01-29 DIAGNOSIS — S72001D Fracture of unspecified part of neck of right femur, subsequent encounter for closed fracture with routine healing: Secondary | ICD-10-CM | POA: Diagnosis not present

## 2020-01-29 LAB — BLOOD GAS, VENOUS
Acid-Base Excess: 12.7 mmol/L — ABNORMAL HIGH (ref 0.0–2.0)
Bicarbonate: 39 mmol/L — ABNORMAL HIGH (ref 20.0–28.0)
FIO2: 0.36
O2 Saturation: 52.8 %
Patient temperature: 37
pCO2, Ven: 63 mmHg — ABNORMAL HIGH (ref 44.0–60.0)
pH, Ven: 7.4 (ref 7.250–7.430)

## 2020-01-29 LAB — CREATININE, SERUM
Creatinine, Ser: 0.68 mg/dL (ref 0.44–1.00)
GFR, Estimated: >60 mL/min (ref >60)

## 2020-01-29 LAB — POTASSIUM: Potassium: 4.3 mmol/L (ref 3.5–5.1)

## 2020-01-29 NOTE — Care Management Important Message (Signed)
Important Message  Patient Details  Name: Melissa George MRN: [id number] Date of Birth: 03-13-1951   Medicare Important Message Given:  Yes     Melissa George 01/29/2020, 10:50 AM

## 2020-01-29 NOTE — TOC Progression Note (Signed)
Transition of Care [facility]) - Progression Note    Patient Details  Name: Melissa George MRN: [id number] Date of Birth: November 18, 1950  Transition of Care [facility]) CM/SW [place], RN Phone Number: 01/29/2020, 9:01 AM  Clinical Narrative:   RNCM received return call from Bethanne Ginger, [place] with approval to accept bed at [facility]. RNCM reached out and accepted bed in the hub and called Ebony Hail with [facility] to start insurance authorization.          Expected Discharge Plan and Services                                                 Social Determinants of Health (SDOH) Interventions    Readmission Risk Interventions No flowsheet data found.

## 2020-01-29 NOTE — Progress Notes (Signed)
[facility] Liaison note:  Patient is currently followed by [facility] services at [place] care home with a hospice diagnosis of emphysema. She is a DNR code with out of facility DNR in place in the home.  Patient was sent to the [facility] ED followingafall at her group homeHospice was not notified prior to transport. In the  ED right hip xray revealed anacute angulated subcapital femoral neck fracture. This is a related admission per hospice physician Dr. Gilford Rile.  Visited patient at bedside. Patient was alert and again quietly interactive with Probation officer. Discussed discharge plan to the [facility] tomorrow. Patient expressed concern over how she would transport, assured her that the [facility] arranges transport. Appetite remains poor, continue to encourage fluids.  Foley draining clear light yellow urine.  Patient did report pain during visit and requested Tylenol. Staff RN Myles Gip made aware.  With plan for discharge to the [facility] tomorrow, patient will be revoking her hospice benefit to pursue rehab. Hospice social worker will coordinate revocation paperwork with patient's DSS guardian. TOC Misty Green aware. Patient will require non emergent transport at discharge.  Outpatient Palliative to continue to follow at the [facility]. Attending and TOC aware.  VS: 98.5, 125/55, 84, 18, 97% on 4 liters   I/O: no intake charted/1500  Abnormal labs: N/A  IV/PRN meds:Tyelonl 650 mg x 2 doses, Liquid morphine concentrate 5 mg x 2 doses, Trazodone 25 mg x 1 dose  Hospital problem list   Principal Problem:   Closed fracture of neck of right femur (HCC) Active Problems:   COPD (chronic obstructive pulmonary disease) (HCC)   Depression with anxiety   S/P right hip fracture   AKI (acute kidney injury) ([place])   Symptomatic anemia  Acute symptomatic anemia likely postoperative blood loss.  Status post PRBC transfusion.  Hemoglobin has  remained stable at this time.  Latest hemoglobin of 8.7.  CBC Latest Ref Rng & Units 01/27/2020 01/26/2020 01/25/2020  WBC 4.0 - 10.5 K/uL - 5.9 4.7  Hemoglobin 12.0 - 15.0 g/dL 8.7(L) 6.9(L) 7.2(L)  Hematocrit 36 - 46 % 27.7(L) 22.5(L) 22.6(L)  Platelets 150 - 400 K/uL - 227 173    S/pright hip hemiarthroplasty on 01/21/2020 for right hip fracture. Analgesics as needed for pain.Dr. Harlow Mares, orthopedic surgeon, recommended resuming Lovenox and continuing for 14 days.    COPD hypoxic respiratory failure.  On 4 L of oxygen at baseline.  Continue bronchodilators.  She was on hospice for COPD  Anxiety and depression.  Continue Ativan and Lexapro  Constipation: Continue laxatives   Discharge Planning: Planned discharge to [facility] for short term rehab IDG: updatedto plan for SNF, hospice social worker to contact [place] guardian regarding revocation of hospice benefit  Goals of care: DNR Please call with any hospice related questions. Thank you.  Flo Shanks BSN, RN, [place] [phone]

## 2020-01-29 NOTE — Progress Notes (Signed)
Physical Therapy Treatment Patient Details Name: Melissa George MRN: [id number] DOB: [birth year] Today's Date: 01/29/2020    History of Present Illness Melissa George is a 69 y/o female who underwent R anterior hip hemiarthroplasty after sustaining a mechanical fall. 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    PT Comments    Pt was awake in supine with HOB el;evated ~ 20 degrees upon arriving. She agrees to PT session with encouragement. Overall pt has very flat affect and requires increased time to process desired task. She was able to exit L side of bed with min assist. Sat EOB x several minutes prior to standing and ambulating to recliner. She was able to tolerate ambulation ~ 20 ft with RW + constant min assist. Pt has incraesed difficulty standing from lower surfaces. Reviewed HEP handout and pt states she will perform I'ly. Did not want to perform 2/2 to pain. At conclusion of session, pt was sitting in recliner with chair alarm in place, call bell in reach, and RN aware of pt's abilities.     Follow Up Recommendations  SNF     Equipment Recommendations  Rolling walker with 5" wheels;3in1 (PT)    Recommendations for Other Services       Precautions / Restrictions Precautions Precautions: Fall Restrictions Weight Bearing Restrictions: No RLE Weight Bearing: Weight bearing as tolerated    Mobility  Bed Mobility Overal bed mobility: Needs Assistance Bed Mobility: Supine to Sit     Supine to sit: Min assist     General bed mobility comments: MMin assist to safely exit L side of bed with increased time to perform + vcs for technique.  Transfers Overall transfer level: Needs assistance Equipment used: Rolling walker (2 wheeled) Transfers: Sit to/from Stand Sit to Stand: Min assist;Mod assist;From elevated surface         General transfer comment: pt performed STS 3 x throughout session. min assist from elevated  bed height but mod assist from lower recliner height.  Ambulation/Gait Ambulation/Gait assistance: Min assist Gait Distance (Feet): 20 Feet Assistive device: Rolling walker (2 wheeled) Gait Pattern/deviations: Narrow base of support;Decreased step length - right;Step-to pattern Gait velocity: decreased   General Gait Details: pt was able to ambulate ~ 20 ft prior to requesting to sit. min assist + vcs for safety throughout       Balance Overall balance assessment: Needs assistance Sitting-balance support: Feet supported Sitting balance-Leahy Scale: Good Sitting balance - Comments: no LOB with sitting EOB several minutes   Standing balance support: Bilateral upper extremity supported Standing balance-Leahy Scale: Fair Standing balance comment: reliant on BUE support on RW for support. min assist during dynamic activity        Cognition Arousal/Alertness: Awake/alert Behavior During Therapy: Flat affect Overall Cognitive Status: Within Functional Limits for tasks assessed      General Comments: Pt has flat affect but was agreeable to session and cooperative throughout.              Pertinent Vitals/Pain Pain Assessment: 0-10 Pain Score: 4  Faces Pain Scale: Hurts a little bit Pain Location: R Hip Pain Intervention(s): Limited activity within patient's tolerance;Monitored during session;Premedicated before session;Repositioned;Ice applied           PT Goals (current goals can now be found in the care plan section) Acute Rehab PT Goals Patient Stated Goal: return to group home Progress towards PT goals: Progressing toward goals    Frequency    7X/week  PT Plan Current plan remains appropriate       AM-PAC PT "6 Clicks" Mobility   Outcome Measure  Help needed turning from your back to your side while in a flat bed without using bedrails?: A Lot Help needed moving from lying on your back to sitting on the side of a flat bed without using bedrails?: A  Lot Help needed moving to and from a bed to a chair (including a wheelchair)?: A Lot Help needed standing up from a chair using your arms (e.g., wheelchair or bedside chair)?: A Lot Help needed to walk in hospital room?: A Lot Help needed climbing 3-5 steps with a railing? : A Lot 6 Click Score: 12    End of Session Equipment Utilized During Treatment: Oxygen (4 L) Activity Tolerance: Patient limited by fatigue Patient left: in chair;with call bell/phone within reach;with chair alarm set Nurse Communication: Mobility status PT Visit Diagnosis: Unsteadiness on feet (R26.81);Other abnormalities of gait and mobility (R26.89);Muscle weakness (generalized) (M62.81);Repeated falls (R29.6);History of falling (Z91.81);Pain Pain - Right/Left: Right Pain - part of body: Hip     Time: 1030-1053 PT Time Calculation (min) (ACUTE ONLY): 23 min  Charges:  $Gait Training: 8-22 mins $Therapeutic Activity: 8-22 mins                     Julaine Fusi PTA 01/29/20, 11:22 AM

## 2020-01-29 NOTE — TOC Progression Note (Addendum)
Transition of Care [facility]) - Progression Note    Patient Details  Name: Melissa George MRN: [id number] Date of Birth: 13-Apr-1950  [facility]) CM/SW [place], RN Phone Number: 01/29/2020, 8:08 AM  Clinical Narrative:   RNCM left VM for Nigel Sloop, guardian at [phone] to determine if they wish to proceed with [facility] for placement. At this time that is the only bed offer. Facility will need to get authorization once bed is decided on.   8:20am: Received return call from [place] she will reach out to her her supervisor and one of them will return call.          Expected Discharge Plan and Services                                                 Social Determinants of Health (SDOH) Interventions    Readmission Risk Interventions No flowsheet data found.

## 2020-01-29 NOTE — Progress Notes (Signed)
PROGRESS NOTE  JANICA ELDRED ZDG:[id number] DOB: 21-Dec-1950 DOA: 01/21/2020 PCP: Remi Haggard, FNP   LOS: 8 days   Brief narrative: As per HPI,  Melissa George is a 69 y.o. female with medical history significant for CAD, COPD, chronic hypoxemic respiratory failure 4 L/min oxygen, depression, anxiety, who presented to the hospital with acute onset of right hip pain following a mechanical fall at the group home.  She was found to have right hip fracture and was admitted to hospital.  Orthopedic saw the patient and underwent right hip hemiarthroplasty on 01/21/2020.  She developed AKI requiring treatment with IV fluids.  She also developed acute blood loss anemia requiring transfusion with 1 unit of packed red blood cells.  Hospital course was complicated by episodes of altered mental status suspected to be due to toxic encephalopathy from opioids.Her legal guardian with DSS, Ms. Lollie Sails, was contemplating discharge to SNF for short-term rehab versus discharge to group home with hospice.  Legal guardian has opted for discharge to SNF for short-term rehab at this time.  Pending authorization.   Assessment/Plan:  Principal Problem:   Closed fracture of neck of right femur (HCC) Active Problems:   COPD (chronic obstructive pulmonary disease) (HCC)   Depression with anxiety   S/P right hip fracture   AKI (acute kidney injury) ([place])   Symptomatic anemia  Acute symptomatic anemia likely postoperative blood loss.  Status post PRBC transfusion.  Hemoglobin has remained stable at this time.  Latest hemoglobin of 8.7.  CBC Latest Ref Rng & Units 01/27/2020 01/26/2020 01/25/2020  WBC 4.0 - 10.5 K/uL - 5.9 4.7  Hemoglobin 12.0 - 15.0 g/dL 8.7(L) 6.9(L) 7.2(L)  Hematocrit 36 - 46 % 27.7(L) 22.5(L) 22.6(L)  Platelets 150 - 400 K/uL - 227 173    S/p right hip hemiarthroplasty on 01/21/2020 for right hip fracture.  Analgesics as needed for pain. Dr. Harlow Mares, orthopedic surgeon,  recommended resuming Lovenox and continuing for 14 days.    COPD hypoxic respiratory failure.  On 4 L of oxygen at baseline.  Continue bronchodilators.  She was on hospice for COPD  Anxiety and depression.  Continue Ativan and Lexapro  Constipation: Continue laxatives  DVT prophylaxis: enoxaparin (LOVENOX) injection 40 mg Start: 01/28/20 2200 SCDs Start: 01/21/20 1746 Place and maintain sequential compression device Start: 01/21/20 0704   Code Status: DNR  Family Communication: None.  Patient has legal guardian  Status is: Inpatient  Remains inpatient appropriate because:Awaiting for skilled nursing facility placement   Dispo: The patient is from: Group home              Anticipated d/c is to: SNF              Anticipated d/c date is: 2 days              Patient currently is medically stable to d/c.   Consultants: Orthopedics  Procedures:  Right hip hemiarthroplasty  Antibiotics:  . None  Anti-infectives (From admission, onward)   Start     Dose/Rate Route Frequency Ordered Stop   01/21/20 2200  ceFAZolin (ANCEF) IVPB 1 g/50 mL premix        1 g 100 mL/hr over 30 Minutes Intravenous Every 6 hours 01/21/20 1745 01/22/20 1110   01/21/20 1519  ceFAZolin (ANCEF) 2-4 GM/100ML-% IVPB       Note to Pharmacy: Milinda Cave   : cabinet override      01/21/20 1519 01/22/20 0329   01/21/20 1517  ceFAZolin (ANCEF) 1-4 GM/50ML-% IVPB       Note to Pharmacy: Sharmon Leyden   : cabinet override      01/21/20 1517 01/22/20 2105   01/21/20 0924  ceFAZolin (ANCEF) IVPB 2g/100 mL premix        2 g 200 mL/hr over 30 Minutes Intravenous 30 min pre-op 01/21/20 0925         Subjective: Today, patient was seen and examined at bedside.  Patient complains of pain at the hip surgical site.  Denies any increasing shortness of breath cough fever chills.  Objective: Vitals:   01/29/20 0831 01/29/20 0900  BP: 124/81   Pulse: 74   Resp: 16   Temp: 97.8 F (36.6 C)   SpO2:  99% 97%    Intake/Output Summary (Last 24 hours) at 01/29/2020 0940 Last data filed at 01/29/2020 0545 Gross per 24 hour  Intake --  Output 1500 ml  Net -1500 ml   Filed Weights   01/21/20 1513  Weight: 45.4 kg   Body mass index is 18.29 kg/m.   Physical Exam: GENERAL: Patient is alert awake and communicative.  On nasal oxygen. Not in obvious distress. HENT: No scleral pallor or icterus. Pupils equally reactive to light. Oral mucosa is moist NECK: is supple, no gross swelling noted. CHEST:   Diminished breath sounds bilaterally. CVS: S1 and S2 heard, no murmur. Regular rate and rhythm.  ABDOMEN: Soft, non-tender, bowel sounds are present. EXTREMITIES: Right hip with dressing from recent hip surgery.  Distal pulses palpable CNS: Cranial nerves are intact. No focal motor deficits. SKIN: warm and dry without rashes.  Right hip dressing.  Data Review: I have personally reviewed the following laboratory data and studies,  CBC: Recent Labs  Lab 01/23/20 0258 01/24/20 0252 01/25/20 0435 01/26/20 0518 01/27/20 0549  WBC 9.1 9.7 4.7 5.9  --   NEUTROABS 7.4 8.2* 4.0  --   --   HGB 7.3* 7.2* 7.2* 6.9* 8.7*  HCT 22.9* 23.0* 22.6* 22.5* 27.7*  MCV 99.1 99.1 97.8 99.6  --   PLT 123* 128* 173 227  --    Basic Metabolic Panel: Recent Labs  Lab 01/23/20 0258 01/23/20 0258 01/24/20 0252 01/24/20 0252 01/25/20 0435 01/26/20 0518 01/27/20 0201 01/28/20 0309 01/29/20 0607  NA 140  --  142  --   --   --  144  --   --   K 4.0   < > 3.8   < > 3.8 3.5 3.8 4.1 4.3  CL 100  --  99  --   --   --  97*  --   --   CO2 32  --  32  --   --   --  40*  --   --   GLUCOSE 105*  --  133*  --   --   --  101*  --   --   BUN 25*  --  18  --   --   --  20  --   --   CREATININE 0.80  --  0.60  --   --   --  0.66  --  0.68  CALCIUM 8.3*  --  8.5*  --   --   --  8.4*  --   --   MG 2.2  --   --   --  2.0 1.9 2.2 2.1  --    < > = values in this interval not displayed.   Liver Function  Tests: No  results for input(s): AST, ALT, ALKPHOS, BILITOT, PROT, ALBUMIN in the last 168 hours. No results for input(s): LIPASE, AMYLASE in the last 168 hours. No results for input(s): AMMONIA in the last 168 hours. Cardiac Enzymes: No results for input(s): CKTOTAL, CKMB, CKMBINDEX, TROPONINI in the last 168 hours. BNP (last 3 results) Recent Labs    09/25/19 2132  BNP 25.2    ProBNP (last 3 results) No results for input(s): PROBNP in the last 8760 hours.  CBG: No results for input(s): GLUCAP in the last 168 hours. Recent Results (from the past 240 hour(s))  Urine culture     Status: None   Collection Time: 01/21/20  3:29 AM   Specimen: Urine, Random  Result Value Ref Range Status   Specimen Description   Final    URINE, RANDOM Performed at [facility], [address], [place], [place] [address]    Special Requests   Final    NONE Performed at [facility], [address] [place], [place] [address]    Culture   Final    NO GROWTH Performed at [facility], [place] [address], [place], [place] [address]    Report Status 01/22/2020 FINAL  Final  Respiratory Panel by RT PCR (Flu A&B, Covid) -     Status: None   Collection Time: 01/21/20  3:29 AM  Result Value Ref Range Status   SARS Coronavirus 2 by RT PCR NEGATIVE NEGATIVE Final    Comment: (NOTE) SARS-CoV-2 target nucleic acids are NOT DETECTED.  The SARS-CoV-2 RNA is generally detectable in upper respiratoy specimens during the acute phase of infection. The lowest concentration of SARS-CoV-2 viral copies this assay can detect is 131 copies/mL. A negative result does not preclude SARS-Cov-2 infection and should not be used as the sole basis for treatment or other patient management decisions. A negative result may occur with  improper specimen collection/handling, submission of specimen other than nasopharyngeal swab, presence of viral mutation(s) within the areas targeted by this assay, and inadequate  number of viral copies (<131 copies/mL). A negative result must be combined with clinical observations, patient history, and epidemiological information. The expected result is Negative.  Fact Sheet for Patients:  PinkCheek.be  Fact Sheet for Healthcare Providers:  GravelBags.it  This test is no t yet approved or cleared by the [place] FDA and  has been authorized for detection and/or diagnosis of SARS-CoV-2 by FDA under an Emergency Use Authorization (EUA). This EUA will remain  in effect (meaning this test can be used) for the duration of the COVID-19 declaration under Section 564(b)(1) of the Act, 21 U.S.C. section 360bbb-3(b)(1), unless the authorization is terminated or revoked sooner.     Influenza A by PCR NEGATIVE NEGATIVE Final   Influenza B by PCR NEGATIVE NEGATIVE Final    Comment: (NOTE) The Xpert Xpress SARS-CoV-2/FLU/RSV assay is intended as an aid in  the diagnosis of influenza from Nasopharyngeal swab specimens and  should not be used as a sole basis for treatment. Nasal washings and  aspirates are unacceptable for Xpert Xpress SARS-CoV-2/FLU/RSV  testing.  Fact Sheet for Patients: PinkCheek.be  Fact Sheet for Healthcare Providers: GravelBags.it  This test is not yet approved or cleared by the [place] FDA and  has been authorized for detection and/or diagnosis of SARS-CoV-2 by  FDA under an Emergency Use Authorization (EUA). This EUA will remain  in effect (meaning this test can be used) for the duration of the  Covid-19 declaration under Section 564(b)(1) of the Act, 21  U.S.C. section 360bbb-3(b)(1), unless the authorization is  terminated or revoked. Performed at [facility], [address] [place], [place] [address]      Studies: No results found.    Flora Lipps, MD  [facility] 01/29/2020

## 2020-01-29 NOTE — Consult Note (Signed)
[place] for Electrolyte Monitoring and Replacement   Recent Labs: Potassium (mmol/L)  Date Value  01/28/2020 4.1  03/02/2014 3.3 (L)   Magnesium (mg/dL)  Date Value  01/28/2020 2.1  01/23/2014 2.2   Calcium (mg/dL)  Date Value  01/27/2020 8.4 (L)   Calcium, Total (mg/dL)  Date Value  03/02/2014 8.8   Albumin (g/dL)  Date Value  01/21/2020 3.8  03/02/2014 3.6   Phosphorus (mg/dL)  Date Value  10/07/2014 3.4   Sodium (mmol/L)  Date Value  01/27/2020 144  03/02/2014 141    Assessment: Patient is a 69 y/o F with medical history including COPD, CAD, anxiety who presented to the ED 10/12 with R hip pain after mechanical fall. Patient found to have R hip fracture. Ortho consulted and plan is for R hip hemiarthroplasty 10/12.    Goal of Therapy:  Electrolytes within normal limits  Plan:  K 4.3  No replacement warranted at this time (x 3 days).  At this time pharmacy will sign-off on consult.   Thank you for allowing pharmacy to be a part of this patient's care.   Rowland Lathe, PharmD Clinical Pharmacist 01/29/2020 7:21 AM

## 2020-01-30 DIAGNOSIS — N179 Acute kidney failure, unspecified: Secondary | ICD-10-CM | POA: Diagnosis not present

## 2020-01-30 DIAGNOSIS — S72001D Fracture of unspecified part of neck of right femur, subsequent encounter for closed fracture with routine healing: Secondary | ICD-10-CM | POA: Diagnosis not present

## 2020-01-30 DIAGNOSIS — F418 Other specified anxiety disorders: Secondary | ICD-10-CM | POA: Diagnosis not present

## 2020-01-30 DIAGNOSIS — J449 Chronic obstructive pulmonary disease, unspecified: Secondary | ICD-10-CM | POA: Diagnosis not present

## 2020-01-30 LAB — RESPIRATORY PANEL BY RT PCR (FLU A&B, COVID)
Influenza A by PCR: NEGATIVE
Influenza B by PCR: NEGATIVE
SARS Coronavirus 2 by RT PCR: NEGATIVE

## 2020-01-30 LAB — CBC
HCT: 29.8 % — ABNORMAL LOW (ref 36.0–46.0)
Hemoglobin: 9.2 g/dL — ABNORMAL LOW (ref 12.0–15.0)
MCH: 30.9 pg (ref 26.0–34.0)
MCHC: 30.9 g/dL (ref 30.0–36.0)
MCV: 100 fL (ref 80.0–100.0)
Platelets: 337 10*3/uL (ref 150–400)
RBC: 2.98 MIL/uL — ABNORMAL LOW (ref 3.87–5.11)
RDW: 12.8 % (ref 11.5–15.5)
WBC: 9.1 10*3/uL (ref 4.0–10.5)
nRBC: 0 % (ref 0.0–0.2)

## 2020-01-30 LAB — BASIC METABOLIC PANEL
Anion gap: 9 (ref 5–15)
BUN: 13 mg/dL (ref 8–23)
CO2: 34 mmol/L — ABNORMAL HIGH (ref 22–32)
Calcium: 9 mg/dL (ref 8.9–10.3)
Chloride: 97 mmol/L — ABNORMAL LOW (ref 98–111)
Creatinine, Ser: 0.68 mg/dL (ref 0.44–1.00)
GFR, Estimated: >60 mL/min (ref >60)
Glucose, Bld: 110 mg/dL — ABNORMAL HIGH (ref 70–99)
Potassium: 4.4 mmol/L (ref 3.5–5.1)
Sodium: 140 mmol/L (ref 135–145)

## 2020-01-30 MED ORDER — MORPHINE SULFATE (CONCENTRATE) 10 MG/0.5ML PO SOLN
5.0000 mg | ORAL | 0 refills | Status: AC | PRN
Start: 2020-01-30 — End: 2021-01-29

## 2020-01-30 MED ORDER — LORAZEPAM 0.5 MG PO TABS: 0.5000 mg | ORAL_TABLET | Freq: Two times a day (BID) | ORAL | 0 refills | Status: AC

## 2020-01-30 MED ORDER — TRAZODONE HCL 50 MG PO TABS: 25.0000 mg | ORAL_TABLET | Freq: Every evening | ORAL | Status: AC | PRN

## 2020-01-30 MED ORDER — ENOXAPARIN SODIUM 40 MG/0.4ML ~~LOC~~ SOLN: 40.0000 mg | SUBCUTANEOUS | 0 refills | Status: AC

## 2020-01-30 NOTE — Progress Notes (Signed)
Discharge note:  Pt discharged to [facility] Transported by [facility]. Report called to receiving nurse Jodi Mourning, RN at facility. Discharge information placed in discharge packet. IV removed. Foley removed. Persriptions signed by Dr. Louanne Belton.  Ronnette Hila, RN

## 2020-01-30 NOTE — Discharge Summary (Addendum)
Physician Discharge Summary  Melissa George PXT:[id number] DOB: [birth year] DOA: 01/21/2020  PCP: Remi Haggard, FNP  Admit date: 01/21/2020 Discharge date: 01/30/2020  Admitted From: Home  Discharge disposition: SNF   Recommendations for Outpatient Follow-Up:    Follow up with your primary care provider at the skilled nursing facility in 3 to 5 days.  Check CBC, BMP, magnesium in the next visit  Patient will need to follow-up with orthopedics in 2 weeks for staple removal.  Continue Lovenox for the next 14 days for DVT prophylaxis.  [facility] outpatient palliative care to follow after discharge.   Discharge Diagnosis:   Principal Problem:   Closed fracture of neck of right femur ([place]) Active Problems:   COPD (chronic obstructive pulmonary disease) (H[facility])   Depression with anxiety   S/P right hip fracture   AKI (acute kidney injury) ([place])   Symptomatic anemia    Discharge Condition: Improved.  Diet recommendation: Low sodium, heart healthy.   Wound care: Local surgical site care  Code status: DNR   History of Present Illness:   Florita Nitsch Laughlinis a 69 y.o.femalewith medical history significant for CAD, COPD,chronic hypoxemic respiratory failure on 4 L/min oxygen,depression, anxiety, who presented to the hospital with acute onset of right hip pain following a mechanical fall at the group home. She was found to have right hip fracture and was admitted to hospital.  Orthopedic saw the patient and underwent right hip hemiarthroplasty on 01/21/2020. She developed AKI requiring treatment with IV fluids.She also developed acute blood loss anemia requiring transfusion with 1 unit of packed red blood cells. Hospital course was complicated by episodes of altered mental status suspected to be due to toxic encephalopathy from opioids.Her legal guardian with DSS, Ms. AdrianDaye,was contemplating discharge to SNF for short-term rehab versus discharge to group  home with hospice. Legal guardian has opted for discharge to SNF for short-term rehab at this time.     Hospital Course:   Following conditions were addressed during hospitalization as listed below,  Acute symptomatic anemia likely postoperative blood loss.  Status post PRBC transfusion.  Hemoglobin has remained stable at this time.  Latest hemoglobin of 9.2 prior to discharge.  S/pright hip hemiarthroplasty on 01/21/2020 for right hip fracture. Analgesics as needed for pain.Dr. Harlow Mares, orthopedic surgeon, recommended resuming Lovenox and continuing for 14 days.    COPD hypoxic respiratory failure.  On 4 L of oxygen at baseline.  Continue bronchodilators.  She was on hospice for COPD prior to admission.  Anxiety and depression.  Continue Ativan and Lexapro on discharge.   Constipation: Continue laxatives on discharge.   Disposition.  At this time, patient is stable for disposition to skilled nursing facility for rehabilitation.  Patient will benefit from continued hospice after completion of rehabilitation.  [facility] outpatient palliative care to follow after discharge.  Legal guardian has been notified regarding the plan for disposition.  Medical Consultants:    Orthopedics  Procedures:    S/pright hip hemiarthroplasty on 01/21/2020 for right hip fracture.  Subjective:   Today, patient and examined at bedside.  Denies any interval complaints.  Denies overt pain.  Denies nausea vomiting fever or chills.  Discharge Exam:   Vitals:   01/30/20 0753 01/30/20 0801  BP:  128/78  Pulse: 82 81  Resp: 12 17  Temp:  97.7 F (36.5 C)  SpO2: 98% 100%   Vitals:   01/29/20 1537 01/29/20 2100 01/30/20 0753 01/30/20 0801  BP: (!) 125/55   128/78  Pulse:  84  82 81  Resp: 18  12 17   Temp: 98.5 F (36.9 C)   97.7 F (36.5 C)  TempSrc: Oral   Oral  SpO2: 99% 99% 98% 100%  Weight:      Height:       General: Alert awake, not in obvious distress nasal cannula oxygen,  thinly built HENT: pupils equally reacting to light,  No scleral pallor or icterus noted. Oral mucosa is moist.  Chest:  Diminished breath sounds bilaterally. CVS: S1 &S2 heard. No murmur.  Regular rate and rhythm. Abdomen: Soft, nontender, nondistended.  Bowel sounds are heard.   Extremities: No cyanosis, clubbing or edema.  Peripheral pulses are palpable.  Right hip with dressing from recent surgery. Psych: Alert, awake and communicative., normal mood CNS:  No cranial nerve deficits.  Power equal in all extremities.   Skin: Warm and dry.  Right hip with dressing.  The results of significant diagnostics from this hospitalization (including imaging, microbiology, ancillary and laboratory) are listed below for reference.     Diagnostic Studies:   DG Chest Port 1 View  Result Date: 01/21/2020 CLINICAL DATA:  Preoperative examination, pulmonary nodule EXAM: PORTABLE CHEST 1 VIEW COMPARISON:  CT and chest radiograph 09/25/2019 FINDINGS: The left upper lobe pulmonary nodule noted on CT examination demonstrates interval growth in the interval since prior examination now measuring roughly 2.6 cm in diameter. Densely calcified nodule is again seen within the right apex, unchanged. The lungs are otherwise clear. No pneumothorax or pleural effusion. Cardiac size within normal limits. The pulmonary vascularity is normal. No acute bone abnormality. IMPRESSION: Interval enlargement of left upper lobe pulmonary nodule, now measuring 2.6 cm. Electronically Signed   By: Fidela Salisbury MD   On: 01/21/2020 04:00   DG HIP OPERATIVE UNILAT W OR W/O PELVIS RIGHT  Result Date: 01/21/2020 CLINICAL DATA:  Right hip fracture EXAM: OPERATIVE RIGHT HIP WITH PELVIS COMPARISON:  None. FLUOROSCOPY TIME:  Radiation Exposure Index (as provided by the fluoroscopic device): 0.7 mGy If the device does not provide the exposure index: Fluoroscopy Time:  1 second Number of Acquired Images:  1 FINDINGS: Right hip hemiarthroplasty  is noted in satisfactory position. No acute soft tissue or bony abnormalities are noted. IMPRESSION: Status post right hip hemiarthroplasty Electronically Signed   By: Inez Catalina M.D.   On: 01/21/2020 18:02   DG Hip Unilat With Pelvis 2-3 Views Right  Result Date: 01/21/2020 CLINICAL DATA:  Right hip pain following fall EXAM: DG HIP (WITH OR WITHOUT PELVIS) 2-3V RIGHT COMPARISON:  None. FINDINGS: Single view radiograph of the pelvis and two view radiograph of the right hip demonstrate a acute, subcapital right femoral neck fracture. The femoral head is still seated within the right acetabulum but demonstrates marked external rotation. The femoral shaft demonstrates superior migration, external rotation, and marked varus angulation. There is at least mild right hip degenerative arthritis with joint space narrowing noted. 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 IMPRESSION: Acute, angulated subcapital femoral neck fracture. Electronically Signed   By: Fidela Salisbury MD   On: 01/21/2020 03:58     Labs:   Basic Metabolic Panel: Recent Labs  Lab 01/24/20 4709 01/24/20 6283 01/25/20 6629 01/25/20 4765 01/26/20 0518 01/26/20 0518 01/27/20 0201 01/27/20 0201 01/28/20 0309 01/28/20 0309 01/29/20 4650 01/30/20 3546  NA 142  --   --   --   --   --  144  --   --   --   --  140  K 3.8   < > 3.8   < > 3.5   < > 3.8   < > 4.1   < > 4.3 4.4  CL 99  --   --   --   --   --  97*  --   --   --   --  97*  CO2 32  --   --   --   --   --  40*  --   --   --   --  34*  GLUCOSE 133*  --   --   --   --   --  101*  --   --   --   --  110*  BUN 18  --   --   --   --   --  20  --   --   --   --  13  CREATININE 0.60  --   --   --   --   --  0.66  --   --   --  0.68 0.68  CALCIUM 8.5*  --   --   --   --   --  8.4*  --   --   --   --  9.0  MG  --   --  2.0  --  1.9  --  2.2  --  2.1  --   --   --    < > = values in this interval not displayed.   GFR Estimated  Creatinine Clearance: 46.2 mL/min (by C-G formula based on SCr of 0.68 mg/dL). Liver Function Tests: No results for input(s): AST, ALT, ALKPHOS, BILITOT, PROT, ALBUMIN in the last 168 hours. No results for input(s): LIPASE, AMYLASE in the last 168 hours. No results for input(s): AMMONIA in the last 168 hours. Coagulation profile No results for input(s): INR, PROTIME in the last 168 hours.  CBC: Recent Labs  Lab 01/24/20 0252 01/25/20 0435 01/26/20 0518 01/27/20 0549 01/30/20 0638  WBC 9.7 4.7 5.9  --  9.1  NEUTROABS 8.2* 4.0  --   --   --   HGB 7.2* 7.2* 6.9* 8.7* 9.2*  HCT 23.0* 22.6* 22.5* 27.7* 29.8*  MCV 99.1 97.8 99.6  --  100.0  PLT 128* 173 227  --  337   Cardiac Enzymes: No results for input(s): CKTOTAL, CKMB, CKMBINDEX, TROPONINI in the last 168 hours. BNP: Invalid input(s): POCBNP CBG: No results for input(s): GLUCAP in the last 168 hours. D-Dimer No results for input(s): DDIMER in the last 72 hours. Hgb A1c No results for input(s): HGBA1C in the last 72 hours. Lipid Profile No results for input(s): CHOL, HDL, LDLCALC, TRIG, CHOLHDL, LDLDIRECT in the last 72 hours. Thyroid function studies No results for input(s): TSH, T4TOTAL, T3FREE, THYROIDAB in the last 72 hours.  Invalid input(s): FREET3 Anemia work up No results for input(s): VITAMINB12, FOLATE, FERRITIN, TIBC, IRON, RETICCTPCT in the last 72 hours. Microbiology Recent Results (from the past 240 hour(s))  Urine culture     Status: None   Collection Time: 01/21/20  3:29 AM   Specimen: Urine, Random  Result Value Ref Range Status   Specimen Description   Final    URINE, RANDOM Performed at [facility], [address], [place], [place] [address]    Special Requests   Final    NONE Performed at [facility], [address], [place], [place] [address]    Culture  Final    NO GROWTH Performed at [facility], [place] [address], [place], [place] [address]    Report Status  01/22/2020 FINAL  Final  Respiratory Panel by RT PCR (Flu A&B, Covid) -     Status: None   Collection Time: 01/21/20  3:29 AM  Result Value Ref Range Status   SARS Coronavirus 2 by RT PCR NEGATIVE NEGATIVE Final    Comment: (NOTE) SARS-CoV-2 target nucleic acids are NOT DETECTED.  The SARS-CoV-2 RNA is generally detectable in upper respiratoy specimens during the acute phase of infection. The lowest concentration of SARS-CoV-2 viral copies this assay can detect is 131 copies/mL. A negative result does not preclude SARS-Cov-2 infection and should not be used as the sole basis for treatment or other patient management decisions. A negative result may occur with  improper specimen collection/handling, submission of specimen other than nasopharyngeal swab, presence of viral mutation(s) within the areas targeted by this assay, and inadequate number of viral copies (<131 copies/mL). A negative result must be combined with clinical observations, patient history, and epidemiological information. The expected result is Negative.  Fact Sheet for Patients:  PinkCheek.be  Fact Sheet for Healthcare Providers:  GravelBags.it  This test is no t yet approved or cleared by the [place] FDA and  has been authorized for detection and/or diagnosis of SARS-CoV-2 by FDA under an Emergency Use Authorization (EUA). This EUA will remain  in effect (meaning this test can be used) for the duration of the COVID-19 declaration under Section 564(b)(1) of the Act, 21 U.S.C. section 360bbb-3(b)(1), unless the authorization is terminated or revoked sooner.     Influenza A by PCR NEGATIVE NEGATIVE Final   Influenza B by PCR NEGATIVE NEGATIVE Final    Comment: (NOTE) The Xpert Xpress SARS-CoV-2/FLU/RSV assay is intended as an aid in  the diagnosis of influenza from Nasopharyngeal swab specimens and  should not be used as a sole basis for treatment.  Nasal washings and  aspirates are unacceptable for Xpert Xpress SARS-CoV-2/FLU/RSV  testing.  Fact Sheet for Patients: PinkCheek.be  Fact Sheet for Healthcare Providers: GravelBags.it  This test is not yet approved or cleared by the [place] FDA and  has been authorized for detection and/or diagnosis of SARS-CoV-2 by  FDA under an Emergency Use Authorization (EUA). This EUA will remain  in effect (meaning this test can be used) for the duration of the  Covid-19 declaration under Section 564(b)(1) of the Act, 21  U.S.C. section 360bbb-3(b)(1), unless the authorization is  terminated or revoked. Performed at [facility], [address] [place], [place] [address]   Respiratory Panel by RT PCR (Flu A&B, Covid) - Nasopharyngeal Swab     Status: None   Collection Time: 01/30/20 10:16 AM   Specimen: Nasopharyngeal Swab  Result Value Ref Range Status   SARS Coronavirus 2 by RT PCR NEGATIVE NEGATIVE Final    Comment: (NOTE) SARS-CoV-2 target nucleic acids are NOT DETECTED.  The SARS-CoV-2 RNA is generally detectable in upper respiratoy specimens during the acute phase of infection. The lowest concentration of SARS-CoV-2 viral copies this assay can detect is 131 copies/mL. A negative result does not preclude SARS-Cov-2 infection and should not be used as the sole basis for treatment or other patient management decisions. A negative result may occur with  improper specimen collection/handling, submission of specimen other than nasopharyngeal swab, presence of viral mutation(s) within the areas targeted by this assay, and inadequate number of viral copies (<131 copies/mL).  A negative result must be combined with clinical observations, patient history, and epidemiological information. The expected result is Negative.  Fact Sheet for Patients:  PinkCheek.be  Fact Sheet for Healthcare  Providers:  GravelBags.it  This test is no t yet approved or cleared by the [place] FDA and  has been authorized for detection and/or diagnosis of SARS-CoV-2 by FDA under an Emergency Use Authorization (EUA). This EUA will remain  in effect (meaning this test can be used) for the duration of the COVID-19 declaration under Section 564(b)(1) of the Act, 21 U.S.C. section 360bbb-3(b)(1), unless the authorization is terminated or revoked sooner.     Influenza A by PCR NEGATIVE NEGATIVE Final   Influenza B by PCR NEGATIVE NEGATIVE Final    Comment: (NOTE) The Xpert Xpress SARS-CoV-2/FLU/RSV assay is intended as an aid in  the diagnosis of influenza from Nasopharyngeal swab specimens and  should not be used as a sole basis for treatment. Nasal washings and  aspirates are unacceptable for Xpert Xpress SARS-CoV-2/FLU/RSV  testing.  Fact Sheet for Patients: PinkCheek.be  Fact Sheet for Healthcare Providers: GravelBags.it  This test is not yet approved or cleared by the [place] FDA and  has been authorized for detection and/or diagnosis of SARS-CoV-2 by  FDA under an Emergency Use Authorization (EUA). This EUA will remain  in effect (meaning this test can be used) for the duration of the  Covid-19 declaration under Section 564(b)(1) of the Act, 21  U.S.C. section 360bbb-3(b)(1), unless the authorization is  terminated or revoked. Performed at [facility], [address], [place], [place] [address]      Discharge Instructions:    Allergies as of 01/30/2020      Reactions   Ciprofloxacin Shortness Of Breath, Itching   Levofloxacin Other (See Comments)   Reaction:  Unknown    Morphine And Related Nausea And Vomiting   Sulfa Antibiotics Hives, Itching   Symbicort [budesonide-formoterol Fumarate] Other (See Comments)   Reaction:  Psychotic episode   Advair Diskus  [fluticasone-salmeterol] Anxiety   Nickel Rash      Medication List    STOP taking these medications   baclofen 10 MG tablet Commonly known as: LIORESAL   predniSONE 10 MG tablet Commonly known as: DELTASONE   roflumilast 500 MCG Tabs tablet Commonly known as: DALIRESP   zolpidem 5 MG tablet Commonly known as: AMBIEN     TAKE these medications   acetaminophen 325 MG tablet Commonly known as: TYLENOL Take 2 tablets (650 mg total) by mouth every 6 (six) hours as needed for mild pain (or Fever >/= 101).   albuterol 108 (90 Base) MCG/ACT inhaler Commonly known as: VENTOLIN HFA Inhale 2 puffs into the lungs every 6 (six) hours as needed for wheezing or shortness of breath. What changed: when to take this   Arnuity Ellipta 100 MCG/ACT Aepb Generic drug: Fluticasone Furoate Inhale 1 puff into the lungs daily.   busPIRone 5 MG tablet Commonly known as: BUSPAR Take 5 mg by mouth 2 (two) times daily.   calcium carbonate 500 MG chewable tablet Commonly known as: TUMS - dosed in mg elemental calcium Chew 1 tablet by mouth daily.   enoxaparin 40 MG/0.4ML injection Commonly known as: LOVENOX Inject 0.4 mLs (40 mg total) into the skin daily for 14 days.   escitalopram 20 MG tablet Commonly known as: LEXAPRO Take 20 mg by mouth daily.   hydrOXYzine 25 MG capsule Commonly known as: VISTARIL TAKE ONE CAPSULE BY MOUTH EVERY  6 HOURS AS NEEDED   ipratropium-albuterol 0.5-2.5 (3) MG/3ML Soln Commonly known as: DUONEB Take 3 mLs by nebulization 2 (two) times daily as needed.   levothyroxine 50 MCG tablet Commonly known as: SYNTHROID Take 50 mcg by mouth daily. On Monday, Wednesday, Friday   levothyroxine 75 MCG tablet Commonly known as: SYNTHROID Take 75 mcg by mouth daily. On Thursday, Saturday, Sundays   Linzess 72 MCG capsule Generic drug: linaclotide Take 72 mcg by mouth daily as needed.   LORazepam 0.5 MG tablet Commonly known as: ATIVAN Take 1 tablet (0.5 mg  total) by mouth 2 (two) times daily.   magnesium hydroxide 400 MG/5ML suspension Commonly known as: MILK OF MAGNESIA Take 30 mLs by mouth daily as needed for mild constipation.   mirtazapine 30 MG tablet Commonly known as: REMERON Take 30 mg by mouth at bedtime.   morphine CONCENTRATE 10 MG/0.5ML Soln concentrated solution Place 0.25 mLs (5 mg total) under the tongue every 4 (four) hours as needed for severe pain or shortness of breath.   nystatin 100000 UNIT/ML suspension Commonly known as: MYCOSTATIN Take 5 mLs (500,000 Units total) by mouth 4 (four) times daily.   ondansetron 4 MG disintegrating tablet Commonly known as: Zofran ODT Take 1 tablet (4 mg total) by mouth every 8 (eight) hours as needed for nausea or vomiting.   pantoprazole 40 MG tablet Commonly known as: PROTONIX Take 1 tablet (40 mg total) by mouth daily.   perphenazine 8 MG tablet Commonly known as: TRILAFON Take 8 mg by mouth at bedtime.   polyethylene glycol 17 g packet Commonly known as: MIRALAX / GLYCOLAX Take 17 g by mouth daily.   promethazine 25 MG tablet Commonly known as: PHENERGAN Take 25 mg by mouth every 6 (six) hours as needed for nausea or vomiting.   senna 8.6 MG Tabs tablet Commonly known as: SENOKOT Take 1 tablet by mouth at bedtime.   Spiriva Respimat 2.5 MCG/ACT Aers Generic drug: Tiotropium Bromide Monohydrate Inhale 2 puffs into the lungs daily.   theophylline 100 MG 24 hr capsule Commonly known as: THEO-24 Take 1 capsule (100 mg total) by mouth at bedtime.   traZODone 50 MG tablet Commonly known as: DESYREL Take 0.5 tablets (25 mg total) by mouth at bedtime as needed for sleep.          Contact information for follow-up providers    Lovell Sheehan, MD. Schedule an appointment as soon as possible for a visit.   Specialty: Orthopedic Surgery Why: 2 weeks for hip surgery followup and staples Contact information: [place]  [address] [phone]            Contact information for after-discharge care    [facility] .   Service: Skilled Nursing Contact information: [address] [place] [phone]                   Time coordinating discharge: 39 minutes  Signed:  Shakiera Edelson  [facility] 01/30/2020, 2:02 PM

## 2020-01-30 NOTE — Progress Notes (Signed)
Physical Therapy Treatment Patient Details Name: Melissa George MRN: [id number] DOB: 01-03-1951 Today's Date: 01/30/2020    History of Present Illness Melissa George is a 69 y/o female who underwent R anterior hip hemiarthroplasty after sustaining a mechanical fall. 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    PT Comments    Pt continues to be minimally interactive verbally, but was able to and willing to participate with all requested acts and generally speaking did well.  She had expected pain and weakness with bed exercises and struggled with hip flexion but actually showed good AROM/resisted exercise tolerance with most acts.  Ambulation continued to be limited distance but showing increased confidence and decreased need for assist per previous sessions.  Pt progressing, expected to d/c to rehab this afternoon.   Follow Up Recommendations  SNF     Equipment Recommendations       Recommendations for Other Services       Precautions / Restrictions Precautions Precautions: Fall Restrictions RLE Weight Bearing: Weight bearing as tolerated    Mobility  Bed Mobility Overal bed mobility: Needs Assistance Bed Mobility: Supine to Sit     Supine to sit: Min assist     General bed mobility comments: good effort, needed only light assist  Transfers Overall transfer level: Needs assistance Equipment used: Rolling walker (2 wheeled) Transfers: Sit to/from Stand Sit to Stand: Min assist;Min guard         General transfer comment: Cuing for set up and sequencing, but did relatively well needing only light assist to keep hips forward  Ambulation/Gait Ambulation/Gait assistance: Min guard;Min assist Gait Distance (Feet): 20 Feet Assistive device: Rolling walker (2 wheeled)       General Gait Details: pt was able to ambulate ~ 20 ft prior to requesting to sit. on 4L O2 sats appeared to remain in the 90s with no c/o  excessive fatigue   Stairs             Wheelchair Mobility    Modified Rankin (Stroke Patients Only)       Balance Overall balance assessment: Needs assistance Sitting-balance support: Feet supported Sitting balance-Leahy Scale: Good Sitting balance - Comments: no LOB with sitting EOB several minutes   Standing balance support: Bilateral upper extremity supported Standing balance-Leahy Scale: Good Standing balance comment: reliant on BUE support on RW for support. ivity                            Cognition Arousal/Alertness: Awake/alert Behavior During Therapy: Flat affect Overall Cognitive Status: Within Functional Limits for tasks assessed                                        Exercises Total Joint Exercises Ankle Circles/Pumps: Strengthening;15 reps Quad Sets: Strengthening;15 reps Short Arc Quad: AROM;15 reps Heel Slides: AROM;Strengthening;10 reps (with resisted leg ext) Hip ABduction/ADduction: AROM;Supine;Strengthening;Both;10 reps Straight Leg Raises: AAROM;10 reps    General Comments        Pertinent Vitals/Pain Pain Assessment: 0-10 Pain Score: 3  Pain Location: R Hip    Home Living                      Prior Function            PT Goals (current goals can now be  found in the care plan section) Progress towards PT goals: Progressing toward goals    Frequency    7X/week      PT Plan Current plan remains appropriate    Co-evaluation              AM-PAC PT "6 Clicks" Mobility   Outcome Measure  Help needed turning from your back to your side while in a flat bed without using bedrails?: A Little Help needed moving from lying on your back to sitting on the side of a flat bed without using bedrails?: A Little Help needed moving to and from a bed to a chair (including a wheelchair)?: A Lot Help needed standing up from a chair using your arms (e.g., wheelchair or bedside chair)?: A  Little Help needed to walk in hospital room?: A Lot Help needed climbing 3-5 steps with a railing? : A Lot 6 Click Score: 15    End of Session Equipment Utilized During Treatment: Oxygen Activity Tolerance: Patient limited by fatigue Patient left: in chair;with call bell/phone within reach;with chair alarm set Nurse Communication: Mobility status PT Visit Diagnosis: Unsteadiness on feet (R26.81);Other abnormalities of gait and mobility (R26.89);Muscle weakness (generalized) (M62.81);Repeated falls (R29.6);History of falling (Z91.81);Pain Pain - Right/Left: Right Pain - part of body: Hip     Time: 9379-0240 PT Time Calculation (min) (ACUTE ONLY): 31 min  Charges:  $Gait Training: 8-22 mins $Therapeutic Exercise: 8-22 mins                     Kreg Shropshire, DPT  01/30/2020, 2:09 PM

## 2020-02-06 ENCOUNTER — Other Ambulatory Visit: Payer: Self-pay

## 2020-02-06 ENCOUNTER — Non-Acute Institutional Stay: Payer: Medicare (Managed Care) | Admitting: Primary Care

## 2020-02-06 DIAGNOSIS — S72001D Fracture of unspecified part of neck of right femur, subsequent encounter for closed fracture with routine healing: Secondary | ICD-10-CM

## 2020-02-06 DIAGNOSIS — E43 Unspecified severe protein-calorie malnutrition: Secondary | ICD-10-CM

## 2020-02-06 DIAGNOSIS — Z515 Encounter for palliative care: Secondary | ICD-10-CM

## 2020-02-06 DIAGNOSIS — J441 Chronic obstructive pulmonary disease with (acute) exacerbation: Secondary | ICD-10-CM

## 2020-02-06 NOTE — Progress Notes (Signed)
[facility] Pall[facility] Care Consult Note Telephone: [phone]  Fax: [phone]  PATIENT NAME: Melissa George [id number] [phone] (home)  DOB: [birth year] MRN: [id number]  PRIMARY CARE PROVIDER:    Remi Haggard, [address] [place],  [place] [address] [phone]  REFERRING PROVIDER:    Bonnita Nasuti, MD [address] [place],  [place] [address]   RESPONSIBLE PARTY:   Extended Emergency Contact Information Primary Emergency Contact: Nigel Sloop Work Phone: [phone] Mobile Phone: [phone] Relation: Legal Guardian Secondary Emergency Contact: Michaela Corner Address: [address] [place] [address] [place] Phone: [phone] Mobile Phone: [phone] Relation: Son  I met face to face with patient in facility.   ASSESSMENT AND RECOMMENDATIONS:   1. Advance Care Planning/Goals of Care: Goals include to maximize quality of life and symptom management. Our advance care planning conversation included a discussion about:   t   Creation of an  advance directive document .  Decision not to resuscitate or to due to poor prognosis. I met with [name] in her nursing home room. She has recently rescinded hospice in order to undergo rehab after a hip fracture and repair. Her goal is to return to her group home where she lives and most likely re-enroll in hospice. She has DNR order per her chart however staff cannot locate it. I have completed a new one and left it in the facility. I  am happy to coordinate resumption of hospice care once she leaves facility.  2. Symptom Management:   Dyspnea: Today she is in her bed and does not sit up for the interview. Her oxygen is at 4 L and she states it's very hard for her to breathe.  Recommend roxanol for pain and dysnpea, 5 mg q 4 hrs po prn.  Endurance: Staff endorses that she has  very little stamina and does not get up much. She must have portable oxygen for any activity.   Pain: She denies pain but staff stated that she does request her PRN pain meds.   3. Follow up Palliative Care Visit: Palliative care will continue to follow for goals of care clarification and symptom management. Return 2-3 weeks or prn.  4. Family /Caregiver/Community Supports: Has legal guardian, Lives in group home, currently in SNF for PT.  5. Cognitive / Functional decline: A and O x 2. Unable to do any adls, iadls due to endurance  I spent 35 minutes providing this consultation,  from 1600 to 1635. More than 50% of the time in this consultation was spent coordinating communication.   CHIEF COMPLAINT: debility  HISTORY OF PRESENT ILLNESS:  Melissa George is a 69 y.o. year old female  with weight loss, recent fracture and failure to thrive . We are asked to consult around goals of care and advance care planning.    Review and summarization of old Epic records shows or history from other than patient  shows history of recent hip fx and former hospice participation.  Palliative Care was asked to follow this patient by consultation request of Hague, Rosalyn Charters, MD  to help address advance care planning and goals of care. This is an initial visit.  CODE STATUS: DNR  PPS: 30%  HOSPICE ELIGIBILITY/DIAGNOSIS: yes  ROS  General: NAD Pulmonary: denies  Cough,+increased SOB, oxygen  at 4 L / [place] Abdomen: endorses fair appetite, endorses occ constipation GU: denies dysuria, endorses continence of urine MSK:  endorses ROM limitations, no falls reported at SNF Neurological: endorses weakness, endorses  pain Psych: A and O x 2  Physical Exam: Current and past weights:98.2 lbs Constitutional: NAD General :frail appearing, thin EYES: anicteric sclera,EOMI, lids intact, no discharge  ENMT: intact hearing,oral mucous membranes dry CV:  no LE edema Pulmonary: no increased work of breathing, no  cough, no audible wheezes, oxygen 4 l / [place] Abdomen: intake 25%,no ascites GU: deferred MSK: moderate sacropenia, decreased ROM in all extremities,  non ambulatory Skin: warm and dry, no rashes or wounds on visible skin Neuro: Weakness, mild cognitive impairment, grossly non -focal Psych: anxious affect, A and O x 2  CURRENT PROBLEM LIST:  Patient Active Problem List   Diagnosis Date Noted  . Symptomatic anemia 01/26/2020  . AKI (acute kidney injury) ([place]) 01/22/2020  . S/P right hip fracture 01/21/2020  . Closed fracture of neck of right femur ([place])   . DNR (do not resuscitate)   . Constipation   . Shortness of breath   . Goals of care, counseling/discussion   . Palliative care by specialist   . Lung nodule   . Hypothyroidism   . Depression with anxiety   . Hyperlipidemia   . COPD exacerbation ([place]) 09/26/2019  . Depression, major, recurrent, severe with psychosis ([place]) 08/01/2017  . Coronary artery disease 11/06/2014  . NSTEMI (non-ST elevated myocardial infarction) ([place]) 10/29/2014  . Protein-calorie malnutrition, severe ([place]) 10/29/2014  . 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 Delusional disorder, persecutory type ([place]) 09/24/2014  . COPD (chronic obstructive pulmonary disease) ([place]) 09/12/2014  . GAD (generalized anxiety disorder)    PAST MEDICAL HISTORY:  Active Ambulatory Problems    Diagnosis Date Noted  . GAD (generalized anxiety disorder)   . COPD (chronic obstructive pulmonary disease) ([place]) 09/12/2014  . Delusional disorder, persecutory type ([place]) 09/24/2014  . Major depressive disorder, recurrent episode, severe with catatonia ([place]) 10/24/2014  . NSTEMI (non-ST elevated myocardial infarction) ([place]) 10/29/2014  . Protein-calorie malnutrition, severe ([place]) 10/29/2014  . Coronary artery disease 11/06/2014  . Depression, major, recurrent, severe with psychosis ([place]) 08/01/2017  . COPD exacerbation ([place]) 09/26/2019    . Shortness of breath   . Goals of care, counseling/discussion   . Palliative care by specialist   . Lung nodule   . Hypothyroidism   . Depression with anxiety   . Hyperlipidemia   . DNR (do not resuscitate)   . Constipation   . S/P right hip fracture 01/21/2020  . Closed fracture of neck of right femur ([place])   . AKI (acute kidney injury) ([place]) 01/22/2020  . Symptomatic anemia 01/26/2020   Resolved Ambulatory Problems    Diagnosis Date Noted  . No Resolved Ambulatory Problems   Past Medical History:  Diagnosis Date  . Anxiety   . Anxiety   . PAD (peripheral artery disease) ([place])   . 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 Anxiety   . Anxiety   . COPD (chronic obstructive pulmonary disease) ([place])   . PAD (peripheral artery disease) ([place])   . 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 Smoking status: Former Smoker    Packs/day: 0.25    Years: 15.00    Pack years: 3.75  Types: Cigarettes    Quit date: 09/13/2014    Years since quitting: 5.4  . Smokeless tobacco: Never Used  Substance Use Topics  . Alcohol use: No   FAMILY HX:  Family History  Problem Relation Age of Onset  . CAD Mother   . Thyroid cancer Other   . Lung cancer Other     ALLERGIES:  Allergies  Allergen Reactions  . Ciprofloxacin Shortness Of Breath and Itching  . Levofloxacin Other (See Comments)    Reaction:  Unknown   . Morphine And Related Nausea And Vomiting  . Sulfa Antibiotics Hives and Itching  . Symbicort [Budesonide-Formoterol Fumarate] Other (See Comments)    Reaction:  Psychotic episode  . Advair Diskus [Fluticasone-Salmeterol] Anxiety  . Nickel Rash     PERTINENT MEDICATIONS:  Outpatient Encounter Medications as of 02/06/2020  Medication Sig  . acetaminophen (TYLENOL) 325 MG tablet Take 2 tablets (650 mg total) by mouth every 6 (six) hours as needed for mild pain (or Fever >/= 101).  Marland Kitchen albuterol  (PROVENTIL HFA;VENTOLIN HFA) 108 (90 BASE) MCG/ACT inhaler Inhale 2 puffs into the lungs every 6 (six) hours as needed for wheezing or shortness of breath. (Patient taking differently: Inhale 2 puffs into the lungs every 4 (four) hours as needed for wheezing or shortness of breath. )  . busPIRone (BUSPAR) 5 MG tablet Take 5 mg by mouth 2 (two) times daily.  . calcium carbonate (TUMS - DOSED IN MG ELEMENTAL CALCIUM) 500 MG chewable tablet Chew 1 tablet by mouth daily.  Marland Kitchen enoxaparin (LOVENOX) 40 MG/0.4ML injection Inject 0.4 mLs (40 mg total) into the skin daily for 14 days.  Marland Kitchen escitalopram (LEXAPRO) 20 MG tablet Take 20 mg by mouth daily.  . Fluticasone Furoate (ARNUITY ELLIPTA) 100 MCG/ACT AEPB Inhale 1 puff into the lungs daily.  . hydrOXYzine (VISTARIL) 25 MG capsule TAKE ONE CAPSULE BY MOUTH EVERY 6 HOURS AS NEEDED (Patient not taking: Reported on 01/21/2020)  . ipratropium-albuterol (DUONEB) 0.5-2.5 (3) MG/3ML SOLN Take 3 mLs by nebulization 2 (two) times daily as needed.  Marland Kitchen levothyroxine (SYNTHROID, LEVOTHROID) 50 MCG tablet Take 50 mcg by mouth daily. On Monday, Wednesday, Friday  . levothyroxine (SYNTHROID, LEVOTHROID) 75 MCG tablet Take 75 mcg by mouth daily. On Thursday, Saturday, Sundays  . linaclotide (LINZESS) 72 MCG capsule Take 72 mcg by mouth daily as needed.  Marland Kitchen LORazepam (ATIVAN) 0.5 MG tablet Take 1 tablet (0.5 mg total) by mouth 2 (two) times daily.  . magnesium hydroxide (MILK OF MAGNESIA) 400 MG/5ML suspension Take 30 mLs by mouth daily as needed for mild constipation.  . mirtazapine (REMERON) 30 MG tablet Take 30 mg by mouth at bedtime.  . Morphine Sulfate (MORPHINE CONCENTRATE) 10 MG/0.5ML SOLN concentrated solution Place 0.25 mLs (5 mg total) under the tongue every 4 (four) hours as needed for severe pain or shortness of breath.  . nystatin (MYCOSTATIN) 100000 UNIT/ML suspension Take 5 mLs (500,000 Units total) by mouth 4 (four) times daily. (Patient not taking: Reported on  01/21/2020)  . ondansetron (ZOFRAN ODT) 4 MG disintegrating tablet Take 1 tablet (4 mg total) by mouth every 8 (eight) hours as needed for nausea or vomiting.  . pantoprazole (PROTONIX) 40 MG tablet Take 1 tablet (40 mg total) by mouth daily.  Marland Kitchen perphenazine (TRILAFON) 8 MG tablet Take 8 mg by mouth at bedtime.  . polyethylene glycol (MIRALAX / GLYCOLAX) 17 g packet Take 17 g by mouth daily.  . promethazine (PHENERGAN) 25 MG tablet Take 25  mg by mouth every 6 (six) hours as needed for nausea or vomiting.  . senna (SENOKOT) 8.6 MG TABS tablet Take 1 tablet by mouth at bedtime.  . theophylline (THEO-24) 100 MG 24 hr capsule Take 1 capsule (100 mg total) by mouth at bedtime.  . Tiotropium Bromide Monohydrate (SPIRIVA RESPIMAT) 2.5 MCG/ACT AERS Inhale 2 puffs into the lungs daily.  . traZODone (DESYREL) 50 MG tablet Take 0.5 tablets (25 mg total) by mouth at bedtime as needed for sleep.   No facility-administered encounter medications on file as of 02/06/2020.     Jason Coop, NP , DNP, MPH, AGPCNP-BC, A[facility]  COVID-19 PATIENT SCREENING TOOL  Person answering questions: ____________staff______ _____   1.  Is the patient or any family member in the home showing any signs or symptoms regarding respiratory infection?               Person with Symptom- __________NA_________________  a. Fever                                                                          Yes___ No___          ___________________  b. Shortness of breath                                                    Yes___ No___          ___________________ c. Cough/congestion                                       Yes___  No___         ___________________ d. Body aches/pains                                                         Yes___ No___        ____________________ e. Gastrointestinal symptoms (diarrhea, nausea)           Yes___ No___        ____________________  2. Within the past 14 days, has anyone living in the  home had any contact with someone with or under investigation for COVID-19?    Yes___ No_X_   Person __________________

## 2020-02-19 ENCOUNTER — Non-Acute Institutional Stay: Payer: Medicare (Managed Care) | Admitting: Primary Care

## 2020-02-19 ENCOUNTER — Other Ambulatory Visit: Payer: Self-pay

## 2020-02-19 DIAGNOSIS — E43 Unspecified severe protein-calorie malnutrition: Secondary | ICD-10-CM

## 2020-02-19 DIAGNOSIS — Z515 Encounter for palliative care: Secondary | ICD-10-CM

## 2020-02-19 DIAGNOSIS — J441 Chronic obstructive pulmonary disease with (acute) exacerbation: Secondary | ICD-10-CM

## 2020-02-19 DIAGNOSIS — S72001D Fracture of unspecified part of neck of right femur, subsequent encounter for closed fracture with routine healing: Secondary | ICD-10-CM

## 2020-02-19 NOTE — Progress Notes (Signed)
[facility] Pall[facility] Consult Note Telephone: [phone]  Fax: [phone]   TELEHEALTH VISIT STATEMENT Due to the COVID-19 crisis, this visit was done via telemedicine from my office. It was initiated and consented to by this patient and/or family.   Date of encounter: 02/19/20 PATIENT NAME: Melissa George [id number] [phone] (home)  DOB: 13-Apr-1950 MRN: [id number]  PRIMARY CARE PROVIDER:    Remi Haggard, [place],  [place] [address] [phone]  REFERRING PROVIDER:   Bonnita Nasuti, MD [address] [place],  [place] [phone]  RESPONSIBLE PARTY:   Extended Emergency Contact Information Primary Emergency Contact: Nigel Sloop Work Phone: [phone] Mobile Phone: [phone] Relation: Legal Guardian Secondary Emergency Contact: Michaela Corner Address: [address] [place] [address] [place] Phone: [phone] Mobile Phone: [phone] Relation: Son   Palliative Care was asked to follow this patient by consultation request of Haque MD to help address advance care planning and goals of care. This is a follow up  visit.   ASSESSMENT AND RECOMMENDATIONS:   1. Advance Care Planning/Goals of Care: Goals include to maximize quality of life and symptom management. Our advance care planning conversation included a discussion about:      Exploration of personal, cultural or spiritual beliefs that might influence medical decisions   Identification of a healthcare agent - Guardian Freda  DNR on file and in [facility]  For d/c on Monday. Family  Care home is on FL2. Bed patient had before SNF has been released and guardian [name] is trying to find a suitable placement in [facility]. At that time she will be reassessed for hospice admission. She has made some progress with her rehab after her leg fx.     2. Symptom Management:   Nutrition: 97.5 weight on 11/3,  Eating 50-75%. Wt was 110 lbs in 6/21. 12 % weight loss in 4 months.  Pain: Does not state pain, is more distressed by anxiety. She is on several anxiety medications. Recommend SSRI. Also seen by psych.  Mobility: Making progress with therapy. She is transferring in and out of bed to w/c. No falls in SNF.  3. Follow up Palliative Care Visit: Palliative care will continue to follow for goals of care clarification and symptom management. Return 2 weeks or prn.  4. Family /Caregiver/Community Supports: Has legal guardian, [name]. Lives in group home although has to find a new placement as former bed was released.  5. Cognitive / Functional decline: A and O x 1-2. Dependent in all adls and iadls.  I spent 30 minutes providing this consultation,  from 1500 to 1530. More than 50% of the time in this consultation was spent coordinating communication.   CODE STATUS:DNR  PPS: 40%  HOSPICE ELIGIBILITY/DIAGNOSIS: TBD  Subjective:  CHIEF COMPLAINT: debility, immobility  HISTORY OF PRESENT ILLNESS:  Melissa George is a 69 y.o. year old female  with copd, recent fall and femur fracture, immobility, history of psychiatric dx.   We are asked to consult around ACP and goals of care.    History obtained from review of EMR, discussion with primary team, and  interview with family, caregiver  and/or Melissa George. Records reviewed and summarized above.   CURRENT PROBLEM LIST:  Patient Active Problem List   Diagnosis Date Noted  .  Symptomatic anemia 01/26/2020  . AKI (acute kidney injury) ([place]) 01/22/2020  . S/P right hip fracture 01/21/2020  . Closed fracture of neck of right femur ([place])   . DNR (do not resuscitate)   . Constipation   . Shortness of breath   . Goals of care, counseling/discussion   . Palliative care by specialist   . Lung nodule   . Hypothyroidism   . Depression with anxiety   . Hyperlipidemia   . COPD  exacerbation ([place]) 09/26/2019  . Depression, major, recurrent, severe with psychosis ([place]) 08/01/2017  . Coronary artery disease 11/06/2014  . NSTEMI (non-ST elevated myocardial infarction) ([place]) 10/29/2014  . Protein-calorie malnutrition, severe ([place]) 10/29/2014  . Major depressive disorder, recurrent episode, severe with catatonia ([place]) 10/24/2014  . Delusional disorder, persecutory type ([place]) 09/24/2014  . COPD (chronic obstructive pulmonary disease) ([place]) 09/12/2014  . GAD (generalized anxiety disorder)    PAST MEDICAL HISTORY:  Active Ambulatory Problems    Diagnosis Date Noted  . GAD (generalized anxiety disorder)   . COPD (chronic obstructive pulmonary disease) ([place]) 09/12/2014  . Delusional disorder, persecutory type ([place]) 09/24/2014  . Major depressive disorder, recurrent episode, severe with catatonia ([place]) 10/24/2014  . NSTEMI (non-ST elevated myocardial infarction) ([place]) 10/29/2014  . Protein-calorie malnutrition, severe ([place]) 10/29/2014  . Coronary artery disease 11/06/2014  . Depression, major, recurrent, severe with psychosis ([place]) 08/01/2017  . COPD exacerbation ([place]) 09/26/2019  . Shortness of breath   . Goals of care, counseling/discussion   . Palliative care by specialist   . Lung nodule   . Hypothyroidism   . Depression with anxiety   . Hyperlipidemia   . DNR (do not resuscitate)   . Constipation   . S/P right hip fracture 01/21/2020  . Closed fracture of neck of right femur ([place])   . AKI (acute kidney injury) ([place]) 01/22/2020  . Symptomatic anemia 01/26/2020   Resolved Ambulatory Problems    Diagnosis Date Noted  . No Resolved Ambulatory Problems   Past Medical History:  Diagnosis Date  . Anxiety   . Anxiety   . PAD (peripheral artery disease) ([place])   . 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 Smoking status: Former Smoker    Packs/day: 0.25    Years: 15.00    Pack years: 3.75    Types: Cigarettes    Quit  date: 09/13/2014    Years since quitting: 5.4  . Smokeless tobacco: Never Used  Substance Use Topics  . Alcohol use: No   FAMILY HX:  Family History  Problem Relation Age of Onset  . CAD Mother   . Thyroid cancer Other   . Lung cancer Other     ALLERGIES:  Allergies  Allergen Reactions  . Ciprofloxacin Shortness Of Breath and Itching  . Levofloxacin Other (See Comments)    Reaction:  Unknown   . Morphine And Related Nausea And Vomiting  . Sulfa Antibiotics Hives and Itching  . Symbicort [Budesonide-Formoterol Fumarate] Other (See Comments)    Reaction:  Psychotic episode  . Advair Diskus [Fluticasone-Salmeterol] Anxiety  . Nickel Rash     PERTINENT MEDICATIONS:  Outpatient Encounter Medications as of 02/19/2020  Medication Sig  . acetaminophen (TYLENOL) 325 MG tablet Take 2 tablets (650 mg total) by mouth every 6 (six) hours as needed for mild pain (or Fever >/= 101).  Marland Kitchen albuterol (PROVENTIL HFA;VENTOLIN HFA) 108 (90 BASE) MCG/ACT inhaler Inhale 2 puffs into the  lungs every 6 (six) hours as needed for wheezing or shortness of breath. (Patient taking differently: Inhale 2 puffs into the lungs every 4 (four) hours as needed for wheezing or shortness of breath. )  . busPIRone (BUSPAR) 5 MG tablet Take 5 mg by mouth 2 (two) times daily.  . calcium carbonate (TUMS - DOSED IN MG ELEMENTAL CALCIUM) 500 MG chewable tablet Chew 1 tablet by mouth daily.  Marland Kitchen enoxaparin (LOVENOX) 40 MG/0.4ML injection Inject 0.4 mLs (40 mg total) into the skin daily for 14 days.  Marland Kitchen escitalopram (LEXAPRO) 20 MG tablet Take 20 mg by mouth daily.  . Fluticasone Furoate (ARNUITY ELLIPTA) 100 MCG/ACT AEPB Inhale 1 puff into the lungs daily.  . hydrOXYzine (VISTARIL) 25 MG capsule TAKE ONE CAPSULE BY MOUTH EVERY 6 HOURS AS NEEDED (Patient not taking: Reported on 01/21/2020)  . ipratropium-albuterol (DUONEB) 0.5-2.5 (3) MG/3ML SOLN Take 3 mLs by nebulization 2 (two) times daily as needed.  Marland Kitchen levothyroxine  (SYNTHROID, LEVOTHROID) 50 MCG tablet Take 50 mcg by mouth daily. On Monday, Wednesday, Friday  . levothyroxine (SYNTHROID, LEVOTHROID) 75 MCG tablet Take 75 mcg by mouth daily. On Thursday, Saturday, Sundays  . linaclotide (LINZESS) 72 MCG capsule Take 72 mcg by mouth daily as needed.  Marland Kitchen LORazepam (ATIVAN) 0.5 MG tablet Take 1 tablet (0.5 mg total) by mouth 2 (two) times daily.  . magnesium hydroxide (MILK OF MAGNESIA) 400 MG/5ML suspension Take 30 mLs by mouth daily as needed for mild constipation.  . mirtazapine (REMERON) 30 MG tablet Take 30 mg by mouth at bedtime.  . Morphine Sulfate (MORPHINE CONCENTRATE) 10 MG/0.5ML SOLN concentrated solution Place 0.25 mLs (5 mg total) under the tongue every 4 (four) hours as needed for severe pain or shortness of breath.  . nystatin (MYCOSTATIN) 100000 UNIT/ML suspension Take 5 mLs (500,000 Units total) by mouth 4 (four) times daily. (Patient not taking: Reported on 01/21/2020)  . ondansetron (ZOFRAN ODT) 4 MG disintegrating tablet Take 1 tablet (4 mg total) by mouth every 8 (eight) hours as needed for nausea or vomiting.  . pantoprazole (PROTONIX) 40 MG tablet Take 1 tablet (40 mg total) by mouth daily.  Marland Kitchen perphenazine (TRILAFON) 8 MG tablet Take 8 mg by mouth at bedtime.  . polyethylene glycol (MIRALAX / GLYCOLAX) 17 g packet Take 17 g by mouth daily.  . promethazine (PHENERGAN) 25 MG tablet Take 25 mg by mouth every 6 (six) hours as needed for nausea or vomiting.  . senna (SENOKOT) 8.6 MG TABS tablet Take 1 tablet by mouth at bedtime.  . theophylline (THEO-24) 100 MG 24 hr capsule Take 1 capsule (100 mg total) by mouth at bedtime.  . Tiotropium Bromide Monohydrate (SPIRIVA RESPIMAT) 2.5 MCG/ACT AERS Inhale 2 puffs into the lungs daily.  . traZODone (DESYREL) 50 MG tablet Take 0.5 tablets (25 mg total) by mouth at bedtime as needed for sleep.   No facility-administered encounter medications on file as of 02/19/2020.    Objective: deferred  Thank  you for the opportunity to participate in the care of Melissa George.  The palliative care team will continue to follow. Please call our office at [phone] if we can be of additional assistance.  Jason Coop, NP , DNP, MPH, AGPCNP-BC, [facility]

## 2020-03-13 ENCOUNTER — Other Ambulatory Visit: Payer: Self-pay

## 2020-03-13 ENCOUNTER — Non-Acute Institutional Stay: Payer: Medicare (Managed Care) | Admitting: Adult Health Nurse Practitioner

## 2020-03-13 DIAGNOSIS — Z515 Encounter for palliative care: Secondary | ICD-10-CM

## 2020-03-13 DIAGNOSIS — M25551 Pain in right hip: Secondary | ICD-10-CM

## 2020-03-13 DIAGNOSIS — J449 Chronic obstructive pulmonary disease, unspecified: Secondary | ICD-10-CM

## 2020-03-13 DIAGNOSIS — E43 Unspecified severe protein-calorie malnutrition: Secondary | ICD-10-CM

## 2020-03-13 NOTE — Progress Notes (Addendum)
[facility] Consult Note Telephone: [phone]  Fax: [phone]  PATIENT NAME: Melissa George DOB: 03/26/1951 MRN: [id number]  PRIMARY CARE PROVIDER:   Dorthula Matas NP  REFERRING PROVIDER:  Dorthula Matas NP  RESPONSIBLE PARTY:  Primary Emergency Contact: Melissa George Work Phone: [phone] Mobile Phone: [phone] Relation: Legal Guardian Secondary Emergency Contact: Melissa George Address: [address] [place] [address] [place] Phone: [phone] Mobile Phone: [phone] Relation: Son  Chief Complaint:  Follow up palliative visit/dyspnea/complex medical decision making  RECOMMENDATIONS and PLAN:  1.  Advanced care planning.  Patient is DNR.  Discussed restarting hospice services and she would like to restart hospice now that she is done with short term rehab.  More than 16 minutes were spent discussing ACP and arranging hospice services  2.  COPD.  Patient with severe COPD and requires frequent rest breaks to conserve energy and is very dyspneic.  Recommend continuation of hospice services  3. Pain.  Right hip pain post fracture.  This is relieved with PRN Tylenol.  Continue Tylenol PRN  4. PCM.  Patient having weight loss.  Recommend continuation of hospice services  Have reached out to hospice physicians who recommend continuing hospice services.  Spoke with legal guardian who would like to restart hospice services.  Have reached out PCP at facility with recommendation for hospice referral.   HISTORY OF PRESENT ILLNESS:  Melissa George is a 69 y.o. year old female with multiple medical problems including CAD, COPD on O2 @ 4L continuous, hypothyroidism, HLD, PCM, depression, anxiety, lung nodule, h/o of NSTEMI. Palliative Care was asked to help address goals of care. Patient was admitted to [facility] 10/05/19.  She was discharged from  hospice after a fall in October and she wanted to pursue short term rehab.  Patient underwent short term rehab at [facility] in [place] and has now been discharged to [facility].    She sleeps a lot and staff reports that she stays in her room. She eats 50% or less of her meals, which she only eats lunch and dinner.  She used to weigh 110 in June 2021 and now weighs 97 pounds.  She States that she has increased dyspnea with any activity including eating and talking.  She takes frequent rest breaks to conserve energy.  Requires some assistance with ADLs to help conserve energy.    She does get right hip pain which is relieved with Tylenol PRN. States occasional nausea. Gets dizziness with position changes.  Denies dysuria, hematuria, constipation, chest pain.  CODE STATUS: DNR  PPS: 40% HOSPICE ELIGIBILITY/DIAGNOSIS: TBD  PHYSICAL EXAM:  HR 90 O2 99% on 4L General: NAD, frail appearing, thin Eyes:  Sclera anicteric and noninjected with no discharge noted Cardiovascular: regular rate and rhythm Pulmonary: lung sounds diminished; normal respiratory effort Abdomen: soft, nontender, + bowel sounds Extremities: no edema, no joint deformities Skin: no rashes on exposed skin Neurological: Weakness but otherwise nonfocal    PAST MEDICAL HISTORY:  Past Medical History:  Diagnosis Date  . Anxiety   . Anxiety   . COPD (chronic obstructive pulmonary disease) ([place])   . PAD (peripheral artery disease) ([place])   . 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 Smoking status:  Former Smoker    Packs/day: 0.25    Years: 15.00    Pack years: 3.75    Types: Cigarettes    Quit date: 09/13/2014    Years since quitting: 5.5  . Smokeless tobacco: Never Used  Substance Use Topics  . Alcohol use: No    ALLERGIES:  Allergies  Allergen Reactions  . Ciprofloxacin Shortness Of Breath and Itching  . Levofloxacin Other (See Comments)    Reaction:   Unknown   . Morphine And Related Nausea And Vomiting  . Sulfa Antibiotics Hives and Itching  . Symbicort [Budesonide-Formoterol Fumarate] Other (See Comments)    Reaction:  Psychotic episode  . Advair Diskus [Fluticasone-Salmeterol] Anxiety  . Nickel Rash     PERTINENT MEDICATIONS:  Outpatient Encounter Medications as of 03/13/2020  Medication Sig  . acetaminophen (TYLENOL) 325 MG tablet Take 2 tablets (650 mg total) by mouth every 6 (six) hours as needed for mild pain (or Fever >/= 101).  Marland Kitchen albuterol (PROVENTIL HFA;VENTOLIN HFA) 108 (90 BASE) MCG/ACT inhaler Inhale 2 puffs into the lungs every 6 (six) hours as needed for wheezing or shortness of breath. (Patient taking differently: Inhale 2 puffs into the lungs every 4 (four) hours as needed for wheezing or shortness of breath. )  . busPIRone (BUSPAR) 5 MG tablet Take 5 mg by mouth 2 (two) times daily.  . calcium carbonate (TUMS - DOSED IN MG ELEMENTAL CALCIUM) 500 MG chewable tablet Chew 1 tablet by mouth daily.  Marland Kitchen enoxaparin (LOVENOX) 40 MG/0.4ML injection Inject 0.4 mLs (40 mg total) into the skin daily for 14 days.  Marland Kitchen escitalopram (LEXAPRO) 20 MG tablet Take 20 mg by mouth daily.  . Fluticasone Furoate (ARNUITY ELLIPTA) 100 MCG/ACT AEPB Inhale 1 puff into the lungs daily.  . hydrOXYzine (VISTARIL) 25 MG capsule TAKE ONE CAPSULE BY MOUTH EVERY 6 HOURS AS NEEDED (Patient not taking: Reported on 01/21/2020)  . ipratropium-albuterol (DUONEB) 0.5-2.5 (3) MG/3ML SOLN Take 3 mLs by nebulization 2 (two) times daily as needed.  Marland Kitchen levothyroxine (SYNTHROID, LEVOTHROID) 50 MCG tablet Take 50 mcg by mouth daily. On Monday, Wednesday, Friday  . levothyroxine (SYNTHROID, LEVOTHROID) 75 MCG tablet Take 75 mcg by mouth daily. On Thursday, Saturday, Sundays  . linaclotide (LINZESS) 72 MCG capsule Take 72 mcg by mouth daily as needed.  Marland Kitchen LORazepam (ATIVAN) 0.5 MG tablet Take 1 tablet (0.5 mg total) by mouth 2 (two) times daily.  . magnesium hydroxide (MILK  OF MAGNESIA) 400 MG/5ML suspension Take 30 mLs by mouth daily as needed for mild constipation.  . mirtazapine (REMERON) 30 MG tablet Take 30 mg by mouth at bedtime.  . Morphine Sulfate (MORPHINE CONCENTRATE) 10 MG/0.5ML SOLN concentrated solution Place 0.25 mLs (5 mg total) under the tongue every 4 (four) hours as needed for severe pain or shortness of breath.  . nystatin (MYCOSTATIN) 100000 UNIT/ML suspension Take 5 mLs (500,000 Units total) by mouth 4 (four) times daily. (Patient not taking: Reported on 01/21/2020)  . ondansetron (ZOFRAN ODT) 4 MG disintegrating tablet Take 1 tablet (4 mg total) by mouth every 8 (eight) hours as needed for nausea or vomiting.  . pantoprazole (PROTONIX) 40 MG tablet Take 1 tablet (40 mg total) by mouth daily.  Marland Kitchen perphenazine (TRILAFON) 8 MG tablet Take 8 mg by mouth at bedtime.  . polyethylene glycol (MIRALAX / GLYCOLAX) 17 g packet Take 17 g by mouth daily.  . promethazine (PHENERGAN) 25 MG tablet Take 25 mg by mouth every 6 (six) hours as needed  for nausea or vomiting.  . senna (SENOKOT) 8.6 MG TABS tablet Take 1 tablet by mouth at bedtime.  . theophylline (THEO-24) 100 MG 24 hr capsule Take 1 capsule (100 mg total) by mouth at bedtime.  . Tiotropium Bromide Monohydrate (SPIRIVA RESPIMAT) 2.5 MCG/ACT AERS Inhale 2 puffs into the lungs daily.  . traZODone (DESYREL) 50 MG tablet Take 0.5 tablets (25 mg total) by mouth at bedtime as needed for sleep.   No facility-administered encounter medications on file as of 03/13/2020.    Areana Kosanke Jenetta Downer, NP

## 2020-03-14 ENCOUNTER — Emergency Department: Payer: Medicare (Managed Care)

## 2020-03-14 ENCOUNTER — Emergency Department
Admission: EM | Admit: 2020-03-14 | Discharge: 2020-03-14 | Payer: Medicare (Managed Care) | Attending: Emergency Medicine | Admitting: Emergency Medicine

## 2020-03-14 DIAGNOSIS — J441 Chronic obstructive pulmonary disease with (acute) exacerbation: Secondary | ICD-10-CM | POA: Diagnosis not present

## 2020-03-14 DIAGNOSIS — Z96641 Presence of right artificial hip joint: Secondary | ICD-10-CM | POA: Diagnosis not present

## 2020-03-14 DIAGNOSIS — R0602 Shortness of breath: Secondary | ICD-10-CM | POA: Diagnosis present

## 2020-03-14 DIAGNOSIS — E039 Hypothyroidism, unspecified: Secondary | ICD-10-CM | POA: Diagnosis not present

## 2020-03-14 DIAGNOSIS — Z79899 Other long term (current) drug therapy: Secondary | ICD-10-CM | POA: Insufficient documentation

## 2020-03-14 DIAGNOSIS — Z87891 Personal history of nicotine dependence: Secondary | ICD-10-CM | POA: Diagnosis not present

## 2020-03-14 DIAGNOSIS — Z20822 Contact with and (suspected) exposure to covid-19: Secondary | ICD-10-CM | POA: Diagnosis not present

## 2020-03-14 LAB — COMPREHENSIVE METABOLIC PANEL
ALT: 8 U/L (ref 0–44)
AST: 15 U/L (ref 15–41)
Albumin: 3.7 g/dL (ref 3.5–5.0)
Alkaline Phosphatase: 69 U/L (ref 38–126)
Anion gap: 9 (ref 5–15)
BUN: 15 mg/dL (ref 8–23)
CO2: 36 mmol/L — ABNORMAL HIGH (ref 22–32)
Calcium: 9.1 mg/dL (ref 8.9–10.3)
Chloride: 96 mmol/L — ABNORMAL LOW (ref 98–111)
Creatinine, Ser: 0.58 mg/dL (ref 0.44–1.00)
GFR, Estimated: >60 mL/min (ref >60)
Glucose, Bld: 123 mg/dL — ABNORMAL HIGH (ref 70–99)
Potassium: 3.5 mmol/L (ref 3.5–5.1)
Sodium: 141 mmol/L (ref 135–145)
Total Bilirubin: 0.6 mg/dL (ref 0.3–1.2)
Total Protein: 6.6 g/dL (ref 6.5–8.1)

## 2020-03-14 LAB — TROPONIN I (HIGH SENSITIVITY)
Troponin I (High Sensitivity): 5 ng/L (ref <18)
Troponin I (High Sensitivity): 6 ng/L (ref <18)

## 2020-03-14 LAB — CBC
HCT: 35.7 % — ABNORMAL LOW (ref 36.0–46.0)
Hemoglobin: 11.1 g/dL — ABNORMAL LOW (ref 12.0–15.0)
MCH: 30.4 pg (ref 26.0–34.0)
MCHC: 31.1 g/dL (ref 30.0–36.0)
MCV: 97.8 fL (ref 80.0–100.0)
Platelets: 207 10*3/uL (ref 150–400)
RBC: 3.65 MIL/uL — ABNORMAL LOW (ref 3.87–5.11)
RDW: 12.3 % (ref 11.5–15.5)
WBC: 7.8 10*3/uL (ref 4.0–10.5)
nRBC: 0 % (ref 0.0–0.2)

## 2020-03-14 LAB — RESP PANEL BY RT-PCR (FLU A&B, COVID) ARPGX2
Influenza A by PCR: NEGATIVE
Influenza B by PCR: NEGATIVE
SARS Coronavirus 2 by RT PCR: NEGATIVE

## 2020-03-14 MED ORDER — IPRATROPIUM-ALBUTEROL 0.5-2.5 (3) MG/3ML IN SOLN
3.0000 mL | Freq: Once | RESPIRATORY_TRACT | Status: AC
  Administered 2020-03-14: 3 mL via RESPIRATORY_TRACT
  Filled 2020-03-14: qty 3

## 2020-03-14 MED ORDER — PREDNISONE 20 MG PO TABS: 20.0000 mg | ORAL_TABLET | Freq: Every day | ORAL | 0 refills | Status: AC

## 2020-03-14 MED ORDER — PREDNISONE 20 MG PO TABS: 20.0000 mg | ORAL_TABLET | Freq: Every day | ORAL | 0 refills | Status: DC

## 2020-03-14 MED ORDER — PREDNISONE 20 MG PO TABS
20.0000 mg | ORAL_TABLET | Freq: Every day | ORAL | 0 refills | Status: DC
Start: 2020-03-14 — End: 2020-03-14

## 2020-03-14 MED ORDER — METHYLPREDNISOLONE SODIUM SUCC 125 MG IJ SOLR
62.5000 mg | Freq: Once | INTRAMUSCULAR | Status: AC
  Administered 2020-03-14: 62.5 mg via INTRAVENOUS
  Filled 2020-03-14: qty 2

## 2020-03-14 MED ORDER — IOHEXOL 300 MG/ML  SOLN
60.0000 mL | Freq: Once | INTRAMUSCULAR | Status: AC | PRN
  Administered 2020-03-14: 60 mL via INTRAVENOUS

## 2020-03-14 NOTE — ED Provider Notes (Signed)
[facility] Emergency Department Provider Note  Time seen: 8:44 AM  I have reviewed the triage vital signs and the nursing notes.   HISTORY  Chief Complaint Shortness of Breath   HPI Melissa George is a 69 y.o. female with a past medical history anxiety, COPD, depression, presents to the emergency department for shortness of breath.  According to the patient who is coming from a nursing facility she has a history of COPD wears 4 L of oxygen 24/7.  States she has been feeling more short of breath recently.  Denies any chest pain.  No leg swelling or pain.  Denies any recent fever or increased cough.  Patient currently satting 99 to 100% on 4 L but she is sitting upright with mild tachypnea around 25 breaths/min as well as mild tachycardia.   Past Medical History:  Diagnosis Date  . Anxiety   . Anxiety   . COPD (chronic obstructive pulmonary disease) ([place])   . PAD (peripheral artery disease) ([place])   . 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 Symptomatic anemia 01/26/2020  . AKI (acute kidney injury) ([place]) 01/22/2020  . S/P right hip fracture 01/21/2020  . Closed fracture of neck of right femur ([place])   . DNR (do not resuscitate)   . Constipation   . Shortness of breath   . Goals of care, counseling/discussion   . Palliative care by specialist   . Lung nodule   . Hypothyroidism   . Depression with anxiety   . Hyperlipidemia   . COPD exacerbation ([place]) 09/26/2019  . Depression, major, recurrent, severe with psychosis ([place]) 08/01/2017  . Coronary artery disease 11/06/2014  . NSTEMI (non-ST elevated myocardial infarction) ([place]) 10/29/2014  . Protein-calorie malnutrition, severe ([place]) 10/29/2014  . 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 COPD (chronic obstructive pulmonary disease) ([place]) 09/12/2014  .  GAD (generalized anxiety disorder)     Past Surgical History:  Procedure Laterality Date  . ABDOMINAL HYSTERECTOMY    . APPENDECTOMY    . CARDIAC CATHETERIZATION N/A 10/31/2014   Procedure: Left Heart Cath and Coronary Angiography;  Surgeon: Isaias Cowman, MD;  Location: [facility];  Service: Cardiovascular;  Laterality: N/A;  . CESAREAN SECTION    . HAND SURGERY    . HIP ARTHROPLASTY Right 01/21/2020   Procedure: ARTHROPLASTY BIPOLAR HIP (HEMIARTHROPLASTY);  Surgeon: Lovell Sheehan, MD;  Location: [facility];  Service: Orthopedics;  Laterality: Right;    Prior to Admission medications   Medication Sig Start Date End Date Taking? Authorizing Provider  acetaminophen (TYLENOL) 325 MG tablet Take 2 tablets (650 mg total) by mouth every 6 (six) hours as needed for mild pain (or Fever >/= 101). 08/10/17   Loletha Grayer, MD  albuterol (PROVENTIL HFA;VENTOLIN HFA) 108 (90 BASE) MCG/ACT inhaler Inhale 2 puffs into the lungs every 6 (six) hours as needed for wheezing or shortness of breath. Patient taking differently: Inhale 2 puffs into the lungs every 4 (four) hours as needed for wheezing or shortness of breath.  11/11/14   Hildred Priest, MD  busPIRone (BUSPAR) 5 MG tablet Take 5 mg by mouth 2 (two) times daily.    [provider]  calcium carbonate (TUMS - DOSED IN MG ELEMENTAL CALCIUM) 500 MG chewable tablet Chew 1 tablet by mouth daily.    [provider]  enoxaparin (LOVENOX) 40 MG/0.4ML injection Inject 0.4 mLs (40 mg total) into the skin daily for 14 days. 01/30/20 02/13/20  Pokhrel, Corrie Mckusick, MD  escitalopram (LEXAPRO) 20 MG tablet Take 20 mg by mouth daily. 02/19/18   [provider]  Fluticasone Furoate (ARNUITY ELLIPTA) 100 MCG/ACT AEPB Inhale 1 puff into the lungs daily.    [provider]  hydrOXYzine (VISTARIL) 25 MG capsule TAKE ONE CAPSULE BY MOUTH EVERY 6 HOURS AS NEEDED Patient not taking: Reported on 01/21/2020 02/09/18    [provider]  ipratropium-albuterol (DUONEB) 0.5-2.5 (3) MG/3ML SOLN Take 3 mLs by nebulization 2 (two) times daily as needed.    [provider]  levothyroxine (SYNTHROID, LEVOTHROID) 50 MCG tablet Take 50 mcg by mouth daily. On Monday, Wednesday, Friday    [provider]  levothyroxine (SYNTHROID, LEVOTHROID) 75 MCG tablet Take 75 mcg by mouth daily. On Thursday, Saturday, Sundays 02/19/18   [provider]  linaclotide Rolan Lipa) 72 MCG capsule Take 72 mcg by mouth daily as needed.    [provider]  LORazepam (ATIVAN) 0.5 MG tablet Take 1 tablet (0.5 mg total) by mouth 2 (two) times daily. 01/30/20 01/29/21  Pokhrel, Corrie Mckusick, MD  magnesium hydroxide (MILK OF MAGNESIA) 400 MG/5ML suspension Take 30 mLs by mouth daily as needed for mild constipation.    [provider]  mirtazapine (REMERON) 30 MG tablet Take 30 mg by mouth at bedtime. 02/19/18   [provider]  Morphine Sulfate (MORPHINE CONCENTRATE) 10 MG/0.5ML SOLN concentrated solution Place 0.25 mLs (5 mg total) under the tongue every 4 (four) hours as needed for severe pain or shortness of breath. 01/30/20 01/29/21  Pokhrel, Corrie Mckusick, MD  nystatin (MYCOSTATIN) 100000 UNIT/ML suspension Take 5 mLs (500,000 Units total) by mouth 4 (four) times daily. Patient not taking: Reported on 01/21/2020 10/04/19   Loletha Grayer, MD  ondansetron (ZOFRAN ODT) 4 MG disintegrating tablet Take 1 tablet (4 mg total) by mouth every 8 (eight) hours as needed for nausea or vomiting. 07/06/19   Earleen Newport, MD  pantoprazole (PROTONIX) 40 MG tablet Take 1 tablet (40 mg total) by mouth daily. 10/04/19   Loletha Grayer, MD  perphenazine (TRILAFON) 8 MG tablet Take 8 mg by mouth at bedtime. 02/19/18   [provider]  polyethylene glycol (MIRALAX / GLYCOLAX) 17 g packet Take 17 g by mouth daily. 10/04/19   Loletha Grayer, MD  promethazine (PHENERGAN) 25 MG tablet Take 25 mg by mouth  every 6 (six) hours as needed for nausea or vomiting.    [provider]  senna (SENOKOT) 8.6 MG TABS tablet Take 1 tablet by mouth at bedtime.    [provider]  theophylline (THEO-24) 100 MG 24 hr capsule Take 1 capsule (100 mg total) by mouth at bedtime. 10/04/19   Loletha Grayer, MD  Tiotropium Bromide Monohydrate (SPIRIVA RESPIMAT) 2.5 MCG/ACT AERS Inhale 2 puffs into the lungs daily.    [provider]  traZODone (DESYREL) 50 MG tablet Take 0.5 tablets (25 mg total) by mouth at bedtime as needed for sleep. 01/30/20   Flora Lipps, MD    Allergies  Allergen Reactions  . Ciprofloxacin Shortness Of Breath and Itching  . Levofloxacin Other (See Comments)    Reaction:  Unknown   . Morphine And Related Nausea And Vomiting  . Sulfa Antibiotics Hives and Itching  . Symbicort [Budesonide-Formoterol Fumarate] Other (See Comments)    Reaction:  Psychotic episode  . Advair Diskus [Fluticasone-Salmeterol] Anxiety  . Nickel  Rash    Family History  Problem Relation Age of Onset  . CAD Mother   . Thyroid cancer Other   . Lung cancer Other     Social History Social History   Tobacco Use  . Smoking status: Former Smoker    Packs/day: 0.25    Years: 15.00    Pack years: 3.75    Types: Cigarettes    Quit date: 09/13/2014    Years since quitting: 5.5  . Smokeless tobacco: Never Used  Substance Use Topics  . Alcohol use: No  . Drug use: No    Review of Systems Constitutional: Negative for fever. Cardiovascular: Negative for chest pain. Respiratory: Positive for shortness of breath, sitting upright in bed. Gastrointestinal: Negative for abdominal pain, vomiting Musculoskeletal: Negative for musculoskeletal complaints Skin: Negative for skin complaints  Neurological: Negative for headache All other ROS negative  ____________________________________________   PHYSICAL EXAM:  VITAL SIGNS: ED Triage Vitals  Enc Vitals Group     BP 03/14/20 0829  (!) 146/61     Pulse Rate 03/14/20 0827 (!) 114     Resp 03/14/20 0827 (!) 25     Temp 03/14/20 0827 98.4 F (36.9 C)     Temp Source 03/14/20 0827 Oral     SpO2 03/14/20 0827 99 %     Weight 03/14/20 0825 99 lb 3.3 oz (45 kg)     Height 03/14/20 0825 5\' 2"  (1.575 m)     Head Circumference --      Peak Flow --      Pain Score 03/14/20 0825 0     Pain Loc --      Pain Edu? --      Excl. in [place]? --     Constitutional: Alert and oriented.  Overall chronically ill-appearing sitting upright in bed no significant distress Eyes: Normal exam ENT      Head: Normocephalic and atraumatic.      Mouth/Throat: Mucous membranes are moist. Cardiovascular: Regular rhythm rate around 110 bpm.  No significant or obvious murmur. Respiratory: Mild to moderate tachypnea in the mid 20s, no obvious wheeze rales or rhonchi but fairly diminished breath sounds bilaterally. Gastrointestinal: Soft and nontender. No distention.  Musculoskeletal: Nontender with normal range of motion in all extremities. No lower extremity tenderness or edema. Neurologic:  Normal speech and language. No gross focal neurologic deficits  Skin:  Skin is warm, dry and intact.  Psychiatric: Mood and affect are normal.   ____________________________________________    EKG  EKG viewed and interpreted by myself shows sinus tachycardia 112 bpm with a narrow QRS, normal axis, normal intervals, no concerning ST changes  ____________________________________________    RADIOLOGY  Chest x-ray: Left upper lobe opacity.  COPD. CT scan of the chest shows 3.2 cm left upper lobe mass concerning for neoplastic process with hilar adenopathy concerning for possible metastatic disease.  ____________________________________________   INITIAL IMPRESSION / ASSESSMENT AND PLAN / ED COURSE  Pertinent labs & imaging results that were available during my care of the patient were reviewed by me and considered in my medical decision making (see  chart for details).   Patient presents to the emergency department for difficulty breathing.  History of COPD on 4 L of oxygen 24/7.  Denies any increased cough denies any fever or chest pain.  No leg pain or swelling.  We will check labs, chest x-ray and EKG.  We will treat with steroids, duo nebs and reassess.  Reassuringly patient is IDPOEUM 35  100% on her normal 4 L.  Differential would include COPD exacerbation, ACS, pneumonia or pneumothorax or Covid infection.   Patient's work-up is overall reassuring from an acute standpoint.  Unfortunately CT scan does show what appears to be possible neoplastic process in left upper lobe.  I discussed this with the patient as well as oncology follow-up.  We will refer to oncology for further work-up.  Patient is satting 100% on her 4 L.  Mildly tachycardic between 100-110 bpm but has received duo nebs as well as steroids.  Overall patient appears well.  No distress, no respiratory distress, lying in bed calmly.  I believe the patient would benefit from a taper of steroids, but I believe she is safe for discharge home with outpatient follow-up.  LIBA HULSEY was evaluated in Emergency Department on 03/14/2020 for the symptoms described in the history of present illness. She was evaluated in the context of the global COVID-19 pandemic, which necessitated consideration that the patient might be at risk for infection with the SARS-CoV-2 virus that causes COVID-19. Institutional protocols and algorithms that pertain to the evaluation of patients at risk for COVID-19 are in a state of rapid change based on information released by regulatory bodies including the CDC and federal and state organizations. These policies and algorithms were followed during the patient's care in the ED.  ____________________________________________   FINAL CLINICAL IMPRESSION(S) / ED DIAGNOSES  COPD exacerbation   Harvest Dark, MD 03/14/20 1139

## 2020-03-14 NOTE — ED Triage Notes (Signed)
Pt comes EMS from [facility] with increasing SOB. Hx of copd. Normally on 4L [place]. Pt able to answer questions. Pt given duoneb with EMS. 20G L AC.

## 2020-03-14 NOTE — Discharge Instructions (Signed)
You have been seen in the emergency department today for shortness of breath.  This is likely due to your COPD.  Please begin taking your steroids as prescribed tomorrow 03/15/2020. Also as we discussed your CT scan today shows a 3.2 cm mass in your left lung that is concerning for possible cancer.  Please follow-up with oncology by calling the number provided to obtain a follow-up appointment this week to discuss further work-up and management.

## 2020-03-16 ENCOUNTER — Encounter: Payer: Self-pay | Admitting: *Deleted

## 2020-03-16 DIAGNOSIS — R918 Other nonspecific abnormal finding of lung field: Secondary | ICD-10-CM

## 2020-03-16 NOTE — Progress Notes (Signed)
  Oncology Nurse Navigator Documentation  Navigator Location: [facility] (03/16/20 1400) Referral Date to RadOnc/MedOnc: 03/16/20 (03/16/20 1400) )Navigator Encounter Type: Telephone (03/16/20 1400) Telephone: Outgoing Call (03/16/20 1400) Abnormal Finding Date: 03/14/20 (03/16/20 1400)                   Treatment Phase: Abnormal Scans (03/16/20 1400) Barriers/Navigation Needs: Coordination of Care (03/16/20 1400)   Interventions: Coordination of Care (03/16/20 1400)   Coordination of Care: Appts;Radiology (03/16/20 1400)           Per Dr. Tasia Catchings - pt needs to be scheduled for outpatient PET scan and to see her 1-2 days as initial consult for lung mass. Approval for PET scan pending with insurance at this time. Will have to wait for approval before scheduling PET scan. Once approved, will schedule pt for PET and initial consult with Dr. Osker Mason with [facility] and made aware.        Time Spent with Patient: 30 (03/16/20 1400)

## 2020-03-23 ENCOUNTER — Inpatient Hospital Stay: Payer: Medicare (Managed Care)

## 2020-03-31 ENCOUNTER — Other Ambulatory Visit: Payer: Medicare (Managed Care)

## 2020-04-02 ENCOUNTER — Inpatient Hospital Stay: Payer: Medicare (Managed Care)

## 2020-04-02 ENCOUNTER — Inpatient Hospital Stay: Payer: Medicare (Managed Care) | Attending: Oncology | Admitting: Oncology

## 2020-04-14 ENCOUNTER — Ambulatory Visit: Admission: RE | Admit: 2020-04-14 | Payer: Medicare (Managed Care) | Source: Ambulatory Visit

## 2020-04-21 ENCOUNTER — Other Ambulatory Visit: Payer: Self-pay

## 2020-04-21 ENCOUNTER — Ambulatory Visit
Admission: RE | Admit: 2020-04-21 | Discharge: 2020-04-21 | Disposition: A | Discharge status: Home / Self Care | Payer: Medicare (Managed Care) | Source: Ambulatory Visit | Attending: Oncology | Admitting: Oncology

## 2020-04-21 DIAGNOSIS — R918 Other nonspecific abnormal finding of lung field: Secondary | ICD-10-CM | POA: Insufficient documentation

## 2020-04-21 DIAGNOSIS — J439 Emphysema, unspecified: Secondary | ICD-10-CM | POA: Insufficient documentation

## 2020-04-21 DIAGNOSIS — I700 Atherosclerosis of aorta: Secondary | ICD-10-CM | POA: Insufficient documentation

## 2020-04-21 LAB — GLUCOSE, CAPILLARY: Glucose-Capillary: 86 mg/dL (ref 70–99)

## 2020-04-21 MED ORDER — FLUDEOXYGLUCOSE F - 18 (FDG) INJECTION
5.2780 | Freq: Once | INTRAVENOUS | Status: AC | PRN
  Administered 2020-04-21: 5.278 via INTRAVENOUS

## 2020-04-27 ENCOUNTER — Telehealth: Payer: Self-pay | Admitting: *Deleted

## 2020-04-27 NOTE — Telephone Encounter (Signed)
Message left with pt's guardian, [name], requesting call back to review results and recommendations at this time.

## 2020-04-27 NOTE — Telephone Encounter (Signed)
-----   Message from Earlie Server, MD sent at 04/21/2020  2:14 PM EST ----- Regarding: RE: PET results Clinically stage IV lung cancer if the small liver lesion is part of the process. She will need to establish tissue diagnosis with biopsy, recommendation pending on biopsy results.  ----- Message ----- From: Telford Nab, RN Sent: 04/21/2020   1:06 PM EST To: Earlie Server, MD Subject: PET results                                    When you get a chance, can you review her PET results and let me know your recommendations. I am going to call her legal guardian to make her aware of the results as well as your recommendations.   Angie Fava

## 2020-04-29 ENCOUNTER — Emergency Department: Payer: Medicare (Managed Care)

## 2020-04-29 ENCOUNTER — Non-Acute Institutional Stay: Payer: Medicare (Managed Care) | Admitting: Adult Health Nurse Practitioner

## 2020-04-29 ENCOUNTER — Encounter: Payer: Self-pay | Admitting: Emergency Medicine

## 2020-04-29 ENCOUNTER — Other Ambulatory Visit: Payer: Self-pay

## 2020-04-29 DIAGNOSIS — Z96641 Presence of right artificial hip joint: Secondary | ICD-10-CM | POA: Diagnosis not present

## 2020-04-29 DIAGNOSIS — Z7951 Long term (current) use of inhaled steroids: Secondary | ICD-10-CM | POA: Insufficient documentation

## 2020-04-29 DIAGNOSIS — Z87891 Personal history of nicotine dependence: Secondary | ICD-10-CM | POA: Diagnosis not present

## 2020-04-29 DIAGNOSIS — R911 Solitary pulmonary nodule: Secondary | ICD-10-CM

## 2020-04-29 DIAGNOSIS — J449 Chronic obstructive pulmonary disease, unspecified: Secondary | ICD-10-CM

## 2020-04-29 DIAGNOSIS — J441 Chronic obstructive pulmonary disease with (acute) exacerbation: Secondary | ICD-10-CM | POA: Insufficient documentation

## 2020-04-29 DIAGNOSIS — I2510 Atherosclerotic heart disease of native coronary artery without angina pectoris: Secondary | ICD-10-CM | POA: Diagnosis not present

## 2020-04-29 DIAGNOSIS — Z515 Encounter for palliative care: Secondary | ICD-10-CM

## 2020-04-29 DIAGNOSIS — E43 Unspecified severe protein-calorie malnutrition: Secondary | ICD-10-CM

## 2020-04-29 DIAGNOSIS — C3412 Malignant neoplasm of upper lobe, left bronchus or lung: Secondary | ICD-10-CM | POA: Diagnosis not present

## 2020-04-29 DIAGNOSIS — R079 Chest pain, unspecified: Secondary | ICD-10-CM | POA: Diagnosis present

## 2020-04-29 LAB — CBC WITH DIFFERENTIAL/PLATELET
Abs Immature Granulocytes: 0.07 10*3/uL (ref 0.00–0.07)
Basophils Absolute: 0 10*3/uL (ref 0.0–0.1)
Basophils Relative: 0 %
Eosinophils Absolute: 0.2 10*3/uL (ref 0.0–0.5)
Eosinophils Relative: 2 %
HCT: 33.8 % — ABNORMAL LOW (ref 36.0–46.0)
Hemoglobin: 10.4 g/dL — ABNORMAL LOW (ref 12.0–15.0)
Immature Granulocytes: 1 %
Lymphocytes Relative: 11 %
Lymphs Abs: 1.3 10*3/uL (ref 0.7–4.0)
MCH: 30.1 pg (ref 26.0–34.0)
MCHC: 30.8 g/dL (ref 30.0–36.0)
MCV: 97.7 fL (ref 80.0–100.0)
Monocytes Absolute: 1.2 10*3/uL — ABNORMAL HIGH (ref 0.1–1.0)
Monocytes Relative: 10 %
Neutro Abs: 9.4 10*3/uL — ABNORMAL HIGH (ref 1.7–7.7)
Neutrophils Relative %: 76 %
Platelets: 270 10*3/uL (ref 150–400)
RBC: 3.46 MIL/uL — ABNORMAL LOW (ref 3.87–5.11)
RDW: 12.6 % (ref 11.5–15.5)
WBC: 12.2 10*3/uL — ABNORMAL HIGH (ref 4.0–10.5)
nRBC: 0 % (ref 0.0–0.2)

## 2020-04-29 LAB — COMPREHENSIVE METABOLIC PANEL
ALT: 9 U/L (ref 0–44)
AST: 14 U/L — ABNORMAL LOW (ref 15–41)
Albumin: 3.4 g/dL — ABNORMAL LOW (ref 3.5–5.0)
Alkaline Phosphatase: 86 U/L (ref 38–126)
Anion gap: 12 (ref 5–15)
BUN: 20 mg/dL (ref 8–23)
CO2: 33 mmol/L — ABNORMAL HIGH (ref 22–32)
Calcium: 8.9 mg/dL (ref 8.9–10.3)
Chloride: 97 mmol/L — ABNORMAL LOW (ref 98–111)
Creatinine, Ser: 0.73 mg/dL (ref 0.44–1.00)
GFR, Estimated: >60 mL/min (ref >60)
Glucose, Bld: 147 mg/dL — ABNORMAL HIGH (ref 70–99)
Potassium: 3.7 mmol/L (ref 3.5–5.1)
Sodium: 142 mmol/L (ref 135–145)
Total Bilirubin: 0.4 mg/dL (ref 0.3–1.2)
Total Protein: 6.8 g/dL (ref 6.5–8.1)

## 2020-04-29 LAB — TROPONIN I (HIGH SENSITIVITY)
Troponin I (High Sensitivity): 5 ng/L (ref <18)
Troponin I (High Sensitivity): 5 ng/L (ref <18)

## 2020-04-29 NOTE — ED Triage Notes (Signed)
EMS brings pt in from the [facility] for c/o chest tightness x wk; O2 at 4l/min via [place] at all times

## 2020-04-29 NOTE — ED Triage Notes (Signed)
Pt to ED via EMS the [facility] c/o left chest pain x1 week and worsening that is throbbing, denies cough, states SOB but no worse than normal.  On 4L [place] chronically.  Pt A&Ox4, chest rise even and unlabored sitting in wheelchair, denies n/v/d or urinary changes.

## 2020-04-29 NOTE — Progress Notes (Signed)
[place] Consult Note Telephone: [phone]  Fax: [phone]  PATIENT NAME: Melissa George DOB: [birth year] MRN: [id number]  PRIMARY CARE PROVIDER:  Dorthula Matas NP  REFERRING PROVIDER:  Dorthula Matas NP  RESPONSIBLE PARTY:  Primary Emergency Contact: Nigel Sloop Work Phone: [phone] Mobile Phone: [phone] Relation: Legal Guardian Secondary Emergency Contact: Michaela Corner Address: [address] [place] [address] [place] Phone: [phone] Mobile Phone: [phone] Relation: Son  Chief Complaint:  Follow up palliative visit/dyspnea/complex medical decision making  RECOMMENDATIONS and PLAN:  1.  Advanced care planning.  Patient is DNR. Called guardian to update on visit.  Left VM with reason for call and contact info  2.  Lung nodule.  Patient is pursuing diagnosis and is awaiting PET scan results.  She does want to pursue treatment if able.  This will be ongoing discussion as she gets more information on her diagnosis and prognosis.  Continue follow up and recommendations by oncology  3.  COPD.  Patient has severe COPD requiring O2 @ 4L continuous and frequent rest breaks.  Continue current treatments and will continue discussion of hospice as she gets more information on her lung nodule, see above.    4.  PCM.  Patient does not eat much, about 1 meal in a day.  She has maintained her weight.  Currently weighs 99 pounds with BMI of 18.1.  Continue supportive care at facility.  Palliative will continue to monitor for symptom management/decline and make recommendations as needed.  Will follow up in 3 weeks to discuss her options related to diagnosis.     HISTORY OF PRESENT ILLNESS:  Melissa George is a 70 y.o. year old female with multiple medical problems including CAD, COPD on O2 @ 4L continuous, hypothyroidism, HLD, PCM, depression, anxiety,  lung nodule, h/o of NSTEMI. Palliative Care was asked to help address goals of care. Reviewed medical chart and latest labs and PET scan. Patient states that she has not been given results of her PET scan yet. In Epic message has been left with patient's guardian to go over PET scan results.  Patient does voice that she wants to pursue treatment if able to undergo treatment.   Patient was seen in ER on 03/13/2020 for COPD exacerbation.  She has wheezing today and she is in no respiratory distress.  She has stable DOE.  Does have to take frequent rest breaks to conserve energy.   Gets dizziness with position changes.  Denies dysuria, hematuria, constipation.  CODE STATUS: DNR  PPS: 40% HOSPICE ELIGIBILITY/DIAGNOSIS: TBD  PHYSICAL EXAM:  HR 107  O2 95% on 4L General: NAD, frail appearing, thin Eyes:  Sclera anicteric and noninjected with no discharge noted Cardiovascular: regular rate and rhythm Pulmonary: lung sounds diminished with wheezing heard in upper lobes; normal respiratory effort Abdomen: soft, nontender, + bowel sounds Extremities: no edema, no joint deformities Skin: no rashes on exposed skin Neurological: Weakness but otherwise nonfocal  PAST MEDICAL HISTORY:  Past Medical History:  Diagnosis Date  . Anxiety   . Anxiety   . COPD (chronic obstructive pulmonary disease) ([place])   . PAD (peripheral artery disease) ([place])   . 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 Smoking status: Former Smoker    Packs/day: 0.25    Years: 15.00    Pack years: 3.75    Types: Cigarettes  Quit date: 09/13/2014    Years since quitting: 5.6  . Smokeless tobacco: Never Used  Substance Use Topics  . Alcohol use: No    ALLERGIES:  Allergies  Allergen Reactions  . Ciprofloxacin Shortness Of Breath and Itching  . Levofloxacin Other (See Comments)    Reaction:  Unknown   . Morphine And Related Nausea And Vomiting  . Sulfa Antibiotics Hives and Itching   . Symbicort [Budesonide-Formoterol Fumarate] Other (See Comments)    Reaction:  Psychotic episode  . Advair Diskus [Fluticasone-Salmeterol] Anxiety  . Nickel Rash     PERTINENT MEDICATIONS:  Outpatient Encounter Medications as of 04/29/2020  Medication Sig  . acetaminophen (TYLENOL) 325 MG tablet Take 2 tablets (650 mg total) by mouth every 6 (six) hours as needed for mild pain (or Fever >/= 101).  Marland Kitchen albuterol (PROVENTIL HFA;VENTOLIN HFA) 108 (90 BASE) MCG/ACT inhaler Inhale 2 puffs into the lungs every 6 (six) hours as needed for wheezing or shortness of breath. (Patient taking differently: Inhale 2 puffs into the lungs every 4 (four) hours as needed for wheezing or shortness of breath. )  . busPIRone (BUSPAR) 5 MG tablet Take 5 mg by mouth 2 (two) times daily.  . calcium carbonate (TUMS - DOSED IN MG ELEMENTAL CALCIUM) 500 MG chewable tablet Chew 1 tablet by mouth daily.  Marland Kitchen enoxaparin (LOVENOX) 40 MG/0.4ML injection Inject 0.4 mLs (40 mg total) into the skin daily for 14 days.  Marland Kitchen escitalopram (LEXAPRO) 20 MG tablet Take 20 mg by mouth daily.  . Fluticasone Furoate (ARNUITY ELLIPTA) 100 MCG/ACT AEPB Inhale 1 puff into the lungs daily.  . hydrOXYzine (VISTARIL) 25 MG capsule TAKE ONE CAPSULE BY MOUTH EVERY 6 HOURS AS NEEDED (Patient not taking: Reported on 01/21/2020)  . ipratropium-albuterol (DUONEB) 0.5-2.5 (3) MG/3ML SOLN Take 3 mLs by nebulization 2 (two) times daily as needed.  Marland Kitchen levothyroxine (SYNTHROID, LEVOTHROID) 50 MCG tablet Take 50 mcg by mouth daily. On Monday, Wednesday, Friday  . levothyroxine (SYNTHROID, LEVOTHROID) 75 MCG tablet Take 75 mcg by mouth daily. On Thursday, Saturday, Sundays  . linaclotide (LINZESS) 72 MCG capsule Take 72 mcg by mouth daily as needed.  Marland Kitchen LORazepam (ATIVAN) 0.5 MG tablet Take 1 tablet (0.5 mg total) by mouth 2 (two) times daily.  . magnesium hydroxide (MILK OF MAGNESIA) 400 MG/5ML suspension Take 30 mLs by mouth daily as needed for mild constipation.   . mirtazapine (REMERON) 30 MG tablet Take 30 mg by mouth at bedtime.  . Morphine Sulfate (MORPHINE CONCENTRATE) 10 MG/0.5ML SOLN concentrated solution Place 0.25 mLs (5 mg total) under the tongue every 4 (four) hours as needed for severe pain or shortness of breath.  . nystatin (MYCOSTATIN) 100000 UNIT/ML suspension Take 5 mLs (500,000 Units total) by mouth 4 (four) times daily. (Patient not taking: Reported on 01/21/2020)  . ondansetron (ZOFRAN ODT) 4 MG disintegrating tablet Take 1 tablet (4 mg total) by mouth every 8 (eight) hours as needed for nausea or vomiting.  . pantoprazole (PROTONIX) 40 MG tablet Take 1 tablet (40 mg total) by mouth daily.  Marland Kitchen perphenazine (TRILAFON) 8 MG tablet Take 8 mg by mouth at bedtime.  . polyethylene glycol (MIRALAX / GLYCOLAX) 17 g packet Take 17 g by mouth daily.  . predniSONE (DELTASONE) 20 MG tablet Take 1 tablet (20 mg total) by mouth daily.  . promethazine (PHENERGAN) 25 MG tablet Take 25 mg by mouth every 6 (six) hours as needed for nausea or vomiting.  . senna (SENOKOT) 8.6 MG  TABS tablet Take 1 tablet by mouth at bedtime.  . theophylline (THEO-24) 100 MG 24 hr capsule Take 1 capsule (100 mg total) by mouth at bedtime.  . Tiotropium Bromide Monohydrate (SPIRIVA RESPIMAT) 2.5 MCG/ACT AERS Inhale 2 puffs into the lungs daily.  . traZODone (DESYREL) 50 MG tablet Take 0.5 tablets (25 mg total) by mouth at bedtime as needed for sleep.   No facility-administered encounter medications on file as of 04/29/2020.     Aimy Sweeting Jenetta Downer, NP

## 2020-04-30 ENCOUNTER — Emergency Department
Admission: EM | Admit: 2020-04-30 | Discharge: 2020-04-30 | Disposition: A | Discharge status: Home / Self Care | Payer: Medicare (Managed Care) | Attending: Emergency Medicine | Admitting: Emergency Medicine

## 2020-04-30 DIAGNOSIS — J441 Chronic obstructive pulmonary disease with (acute) exacerbation: Secondary | ICD-10-CM

## 2020-04-30 DIAGNOSIS — R918 Other nonspecific abnormal finding of lung field: Secondary | ICD-10-CM

## 2020-04-30 DIAGNOSIS — R0789 Other chest pain: Secondary | ICD-10-CM

## 2020-04-30 MED ORDER — PREDNISONE 50 MG PO TABS: 50.0000 mg | ORAL_TABLET | Freq: Every day | ORAL | 0 refills | Status: AC

## 2020-04-30 MED ORDER — IPRATROPIUM-ALBUTEROL 0.5-2.5 (3) MG/3ML IN SOLN
6.0000 mL | Freq: Once | RESPIRATORY_TRACT | Status: AC
  Administered 2020-04-30: 6 mL via RESPIRATORY_TRACT
  Filled 2020-04-30: qty 9

## 2020-04-30 MED ORDER — OXYCODONE HCL 5 MG PO TABS
5.0000 mg | ORAL_TABLET | Freq: Once | ORAL | Status: AC
  Administered 2020-04-30: 5 mg via ORAL
  Filled 2020-04-30: qty 1

## 2020-04-30 MED ORDER — ACETAMINOPHEN 500 MG PO TABS
1000.0000 mg | ORAL_TABLET | Freq: Once | ORAL | Status: AC
  Administered 2020-04-30: 1000 mg via ORAL
  Filled 2020-04-30: qty 2

## 2020-04-30 MED ORDER — OXYCODONE HCL 5 MG PO TABS
5.0000 mg | ORAL_TABLET | Freq: Three times a day (TID) | ORAL | 0 refills | Status: DC | PRN
Start: 2020-04-30 — End: 2020-05-11

## 2020-04-30 MED ORDER — PREDNISONE 20 MG PO TABS
60.0000 mg | ORAL_TABLET | Freq: Once | ORAL | Status: AC
  Administered 2020-04-30: 60 mg via ORAL
  Filled 2020-04-30: qty 3

## 2020-04-30 NOTE — ED Notes (Signed)
Called [facility] to transport patient to [facility]

## 2020-04-30 NOTE — ED Notes (Signed)
ED Provider at bedside. 

## 2020-04-30 NOTE — ED Notes (Signed)
Updated MD regarding unable to get legal guardian on phone, was advised to give meds

## 2020-04-30 NOTE — ED Provider Notes (Signed)
[facility] Emergency Department Provider Note ____________________________________________   Event Date/Time   First MD Initiated Contact with Patient 04/30/20 [phone]     (approximate)  I have reviewed the triage vital signs and the nursing notes.  HISTORY  Chief Complaint Chest Pain   HPI Melissa George is a 70 y.o. femalewho presents to the ED for evaluation of chest pain.   Chart review indicates history of COPD, PAD.  Chronic respiratory failure on 4 L home O2.  DNR per palliative care note from yesterday.  Outpatient PET scan performed last week with left upper lobe mass consistent with primary bronchogenic carcinoma.  Patient presents to the ED with subacute left-sided chest pain that has been constant, throbbing in nature and with associated increased shortness of breath from baseline.  She reports her chronic "smoker's cough", but denies increased sputum production, fevers, syncope, emesis, abdominal pain, diarrhea, dysuria.  Currently reporting 7/10 intensity left-sided aching chest pain.   Past Medical History:  Diagnosis Date  . Anxiety   . Anxiety   . COPD (chronic obstructive pulmonary disease) ([place])   . PAD (peripheral artery disease) ([place])   . 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 Symptomatic anemia 01/26/2020  . AKI (acute kidney injury) ([place]) 01/22/2020  . S/P right hip fracture 01/21/2020  . Closed fracture of neck of right femur ([place])   . DNR (do not resuscitate)   . Constipation   . Shortness of breath   . Goals of care, counseling/discussion   . Palliative care by specialist   . Lung nodule   . Hypothyroidism   . Depression with anxiety   . Hyperlipidemia   . COPD exacerbation ([place]) 09/26/2019  . Depression, major, recurrent, severe with psychosis ([place]) 08/01/2017  . Coronary artery disease 11/06/2014  . NSTEMI (non-ST elevated myocardial infarction) ([place]) 10/29/2014  .  Protein-calorie malnutrition, severe ([place]) 10/29/2014  . 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 COPD (chronic obstructive pulmonary disease) ([place]) 09/12/2014  . GAD (generalized anxiety disorder)     Past Surgical History:  Procedure Laterality Date  . ABDOMINAL HYSTERECTOMY    . APPENDECTOMY    . CARDIAC CATHETERIZATION N/A 10/31/2014   Procedure: Left Heart Cath and Coronary Angiography;  Surgeon: Isaias Cowman, MD;  Location: [facility];  Service: Cardiovascular;  Laterality: N/A;  . CESAREAN SECTION    . HAND SURGERY    . HIP ARTHROPLASTY Right 01/21/2020   Procedure: ARTHROPLASTY BIPOLAR HIP (HEMIARTHROPLASTY);  Surgeon: Lovell Sheehan, MD;  Location: [facility];  Service: Orthopedics;  Laterality: Right;    Prior to Admission medications   Medication Sig Start Date End Date Taking? Authorizing Provider  oxyCODONE (ROXICODONE) 5 MG immediate release tablet Take 1 tablet (5 mg total) by mouth every 8 (eight) hours as needed for severe pain. 04/30/20 04/30/21 Yes Vladimir Crofts, MD  predniSONE (DELTASONE) 50 MG tablet Take 1 tablet (50 mg total) by mouth daily with breakfast for 4 days. 04/30/20 05/04/20 Yes Vladimir Crofts, MD  acetaminophen (TYLENOL) 325 MG tablet Take 2 tablets (650 mg total) by mouth every 6 (six) hours as needed for mild pain (or Fever >/= 101). 08/10/17   Loletha Grayer, MD  albuterol (PROVENTIL HFA;VENTOLIN HFA) 108 (90 BASE) MCG/ACT inhaler Inhale 2 puffs into the lungs every 6 (six) hours as needed for  wheezing or shortness of breath. Patient taking differently: Inhale 2 puffs into the lungs every 4 (four) hours as needed for wheezing or shortness of breath.  11/11/14   Hildred Priest, MD  busPIRone (BUSPAR) 5 MG tablet Take 5 mg by mouth 2 (two) times daily.    [provider]  calcium carbonate (TUMS - DOSED IN MG ELEMENTAL  CALCIUM) 500 MG chewable tablet Chew 1 tablet by mouth daily.    [provider]  enoxaparin (LOVENOX) 40 MG/0.4ML injection Inject 0.4 mLs (40 mg total) into the skin daily for 14 days. 01/30/20 02/13/20  Pokhrel, Corrie Mckusick, MD  escitalopram (LEXAPRO) 20 MG tablet Take 20 mg by mouth daily. 02/19/18   [provider]  Fluticasone Furoate (ARNUITY ELLIPTA) 100 MCG/ACT AEPB Inhale 1 puff into the lungs daily.    [provider]  hydrOXYzine (VISTARIL) 25 MG capsule TAKE ONE CAPSULE BY MOUTH EVERY 6 HOURS AS NEEDED Patient not taking: Reported on 01/21/2020 02/09/18   [provider]  ipratropium-albuterol (DUONEB) 0.5-2.5 (3) MG/3ML SOLN Take 3 mLs by nebulization 2 (two) times daily as needed.    [provider]  levothyroxine (SYNTHROID, LEVOTHROID) 50 MCG tablet Take 50 mcg by mouth daily. On Monday, Wednesday, Friday    [provider]  levothyroxine (SYNTHROID, LEVOTHROID) 75 MCG tablet Take 75 mcg by mouth daily. On Thursday, Saturday, Sundays 02/19/18   [provider]  linaclotide Rolan Lipa) 72 MCG capsule Take 72 mcg by mouth daily as needed.    [provider]  LORazepam (ATIVAN) 0.5 MG tablet Take 1 tablet (0.5 mg total) by mouth 2 (two) times daily. 01/30/20 01/29/21  Pokhrel, Corrie Mckusick, MD  magnesium hydroxide (MILK OF MAGNESIA) 400 MG/5ML suspension Take 30 mLs by mouth daily as needed for mild constipation.    [provider]  mirtazapine (REMERON) 30 MG tablet Take 30 mg by mouth at bedtime. 02/19/18   [provider]  Morphine Sulfate (MORPHINE CONCENTRATE) 10 MG/0.5ML SOLN concentrated solution Place 0.25 mLs (5 mg total) under the tongue every 4 (four) hours as needed for severe pain or shortness of breath. 01/30/20 01/29/21  Pokhrel, Corrie Mckusick, MD  nystatin (MYCOSTATIN) 100000 UNIT/ML suspension Take 5 mLs (500,000 Units total) by mouth 4 (four) times daily. Patient not taking: Reported on 01/21/2020  10/04/19   Loletha Grayer, MD  ondansetron (ZOFRAN ODT) 4 MG disintegrating tablet Take 1 tablet (4 mg total) by mouth every 8 (eight) hours as needed for nausea or vomiting. 07/06/19   Earleen Newport, MD  pantoprazole (PROTONIX) 40 MG tablet Take 1 tablet (40 mg total) by mouth daily. 10/04/19   Loletha Grayer, MD  perphenazine (TRILAFON) 8 MG tablet Take 8 mg by mouth at bedtime. 02/19/18   [provider]  polyethylene glycol (MIRALAX / GLYCOLAX) 17 g packet Take 17 g by mouth daily. 10/04/19   Loletha Grayer, MD  predniSONE (DELTASONE) 20 MG tablet Take 1 tablet (20 mg total) by mouth daily. 03/14/20   Harvest Dark, MD  promethazine (PHENERGAN) 25 MG tablet Take 25 mg by mouth every 6 (six) hours as needed for nausea or vomiting.    [provider]  senna (SENOKOT) 8.6 MG TABS tablet Take 1 tablet by mouth at bedtime.    [provider]  theophylline (THEO-24) 100 MG 24 hr capsule Take 1 capsule (100 mg total) by mouth at bedtime. 10/04/19   Loletha Grayer, MD  Tiotropium Bromide Monohydrate (SPIRIVA RESPIMAT) 2.5 MCG/ACT AERS Inhale 2 puffs into  the lungs daily.    [provider]  traZODone (DESYREL) 50 MG tablet Take 0.5 tablets (25 mg total) by mouth at bedtime as needed for sleep. 01/30/20   Pokhrel, Corrie Mckusick, MD    Allergies Ciprofloxacin, Levofloxacin, Morphine and related, Sulfa antibiotics, Symbicort [budesonide-formoterol fumarate], Advair diskus [fluticasone-salmeterol], and Nickel  Family History  Problem Relation Age of Onset  . CAD Mother   . Thyroid cancer Other   . Lung cancer Other     Social History Social History   Tobacco Use  . Smoking status: Former Smoker    Packs/day: 0.25    Years: 15.00    Pack years: 3.75    Types: Cigarettes    Quit date: 09/13/2014    Years since quitting: 5.6  . Smokeless tobacco: Never Used  Substance Use Topics  . Alcohol use: No  . Drug use: No    Review of  Systems  Constitutional: No fever/chills Eyes: No visual changes. ENT: No sore throat. Cardiovascular: Positive for chest pain Respiratory: Positive for shortness of breath. Gastrointestinal: No abdominal pain.  No nausea, no vomiting.  No diarrhea.  No constipation. Genitourinary: Negative for dysuria. Musculoskeletal: Negative for back pain. Skin: Negative for rash. Neurological: Negative for headaches, focal weakness or numbness.  ____________________________________________   PHYSICAL EXAM:  VITAL SIGNS: Vitals:   04/30/20 1500 04/30/20 1530  BP: (!) 143/65 (!) 135/54  Pulse: 94   Resp: 20 (!) 22  Temp:    SpO2: 99%      Constitutional: Alert and oriented.  Sitting upright in bed, cachectic and conversational in full sentences. Eyes: Conjunctivae are normal. PERRL. EOMI. Head: Atraumatic. Nose: No congestion/rhinnorhea. Mouth/Throat: Mucous membranes are moist.  Oropharynx non-erythematous. Neck: No stridor. No cervical spine tenderness to palpation. Cardiovascular: Tachycardic rate, regular rhythm. Grossly normal heart sounds.  Good peripheral circulation. Respiratory: Slight tachypnea to the mid 20s, no further evidence of distress.  Poor air movement throughout and diffuse expiratory wheezes no retractions.  Gastrointestinal: Soft , nondistended, nontender to palpation. No CVA tenderness. Musculoskeletal: No lower extremity tenderness nor edema.  No joint effusions. No signs of acute trauma. Neurologic:  Normal speech and language. No gross focal neurologic deficits are appreciated. No gait instability noted. Skin:  Skin is warm, dry and intact. No rash noted. Psychiatric: Mood and affect are normal. Speech and behavior are normal.  ____________________________________________   LABS (all labs ordered are listed, but only abnormal results are displayed)  Labs Reviewed  CBC WITH DIFFERENTIAL/PLATELET - Abnormal; Notable for the following components:      Result  Value   WBC 12.2 (*)    RBC 3.46 (*)    Hemoglobin 10.4 (*)    HCT 33.8 (*)    Neutro Abs 9.4 (*)    Monocytes Absolute 1.2 (*)    All other components within normal limits  COMPREHENSIVE METABOLIC PANEL - Abnormal; Notable for the following components:   Chloride 97 (*)    CO2 33 (*)    Glucose, Bld 147 (*)    Albumin 3.4 (*)    AST 14 (*)    All other components within normal limits  TROPONIN I (HIGH SENSITIVITY)  TROPONIN I (HIGH SENSITIVITY)   ____________________________________________  12 Lead EKG  Sinus rhythm, rate of 107 bpm.  Normal axis and intervals.  No evidence of acute ischemia.  Sinus tachycardia. ____________________________________________  RADIOLOGY  ED MD interpretation: 2 view CXR by me with LUL mass without evidence of discrete lobar filtration.  Official  radiology report(s): DG Chest 2 View  Result Date: 04/29/2020 CLINICAL DATA:  Chest tightness EXAM: CHEST - 2 VIEW COMPARISON:  03/14/2020, CT 03/14/2020 FINDINGS: Emphysematous disease. Large left upper lobe lung mass abutting the pleural surface of left apex, considerably increased in size compared with radiograph from December 2021. Probable stable size since PET CT in January of this year. Small foci of nodularity at the periphery of the mass. Normal heart size. No pneumothorax IMPRESSION: 1. Large left upper lobe lung mass abutting the apical pleural surface. Mild nodularity at the periphery of the mass which could be due to infection or additional disease. 2. Emphysematous disease. Electronically Signed   By: Donavan Foil M.D.   On: 04/29/2020 21:17    ____________________________________________   PROCEDURES and INTERVENTIONS  Procedure(s) performed (including Critical Care):  .1-3 Lead EKG Interpretation Performed by: Vladimir Crofts, MD Authorized by: Vladimir Crofts, MD     Interpretation: abnormal     ECG rate:  106   ECG rate assessment: tachycardic     Rhythm: sinus tachycardia      Ectopy: none     Conduction: normal      Medications  predniSONE (DELTASONE) tablet 60 mg (60 mg Oral Given 04/30/20 0930)  ipratropium-albuterol (DUONEB) 0.5-2.5 (3) MG/3ML nebulizer solution 6 mL (6 mLs Nebulization Given 04/30/20 0931)  acetaminophen (TYLENOL) tablet 1,000 mg (1,000 mg Oral Given 04/30/20 0930)  oxyCODONE (Oxy IR/ROXICODONE) immediate release tablet 5 mg (5 mg Oral Given 04/30/20 0931)    ____________________________________________   MDM / ED COURSE   70 year old DNR patient with recently diagnosed lung cancer presents to the ED with chest pain and shortness of breath, likely COPD exacerbation, and amenable to outpatient management.  Tachycardic on presentation, resolving after breathing treatments and steroids, no evidence of hypoxia or instability.  Exam demonstrates a cachectic patient with evidence of a COPD exacerbation, with poor air movement and diffuse expiratory wheezes on auscultation.  No evidence of further pathology on exam, such as trauma or neurovascular deficits.  Blood work without acute derangements.  CXR with visualization of her LUL mass, better evaluated on recent PET scan performed as an outpatient last week.  Patient with resolving symptoms after breathing treatments, steroids and oxycodone due to her malignancy-associated pain.  I discussed the importance of following up with her oncologist to discuss her recent PET scan and discuss any treatment measures that she may want to pursue considering her DNR status.  Provided prednisone prescription to treat COPD exacerbation.  We discussed return precautions for the ED.  Patient medically stable for discharge home.   Clinical Course as of 04/30/20 1605  Thu Apr 30, 2020  0834 Called her facility, no response.  [DS]  [phone] Difficulty getting in touch with her legal guardian to confirm they are okay with medical treatment [DS]  1042 Reassessed.  Patient reports improving symptoms.  We discussed outpatient  management.  We discussed return precautions for the ED [DS]    Clinical Course User Index [DS] Vladimir Crofts, MD    ____________________________________________   FINAL CLINICAL IMPRESSION(S) / ED DIAGNOSES  Final diagnoses:  Other chest pain  COPD exacerbation ([place])  Mass of upper lobe of left lung     ED Discharge Orders         Ordered    predniSONE (DELTASONE) 50 MG tablet  Daily with breakfast        04/30/20 1140    oxyCODONE (ROXICODONE) 5 MG immediate release tablet  Every 8 hours  PRN        04/30/20 1140           Cleto Claggett   Note:  This document was prepared using Systems analyst and may include unintentional dictation errors.   Vladimir Crofts, MD 04/30/20 [phone]

## 2020-04-30 NOTE — ED Notes (Signed)
Tranf to supervisor left message with [facility]

## 2020-04-30 NOTE — ED Notes (Signed)
[phone]  Advised director of social work  Would call back and give consent

## 2020-04-30 NOTE — Discharge Instructions (Signed)
Teairra has signs of a COPD exacerbation and worsening pain from her lung mass.  Use Tylenol for pain and fevers.  Up to 1000 mg per dose, up to 4 times per day.  Do not take more than 4000 mg of Tylenol/acetaminophen within 24 hours.. Please use the prescribed oxycodone for breakthrough/severe pain not covered by her Tylenol.  She has been discharged with a prescription for prednisone steroid to take once daily for total of a 5-day course.  We gave her 1 tablet for today, 1/20.  Please start providing this medication once daily starting tomorrow, 1/21.  Return to the ED with any worsening symptoms.  Patient is to follow-up with her oncologist, and I have attached their information.

## 2020-04-30 NOTE — ED Notes (Signed)
Called [phone]   [facility]

## 2020-04-30 NOTE — ED Notes (Signed)
Contacted legal guardian no response .   Called [employer] , Left message

## 2020-05-01 NOTE — Telephone Encounter (Signed)
Spoke with Kylie regarding next steps to be taken for patient to have further workup for lung mass and metastatic disease. PET scan and MD recommendations reviewed in depth. All questions answered. Informed that pt's insurance is out of network with our facility and pt would have larger out of pocket expense if seen here. Options given for pt to be seen here with out of pocket expense or have referral sent to [facility] or [facility] for further workup if they are in network with her insurance. Kylie stated that she will discuss with pt's legal guardian, [name], as well as Nature conservation officer and let us know how to proceed. Call back number given.

## 2020-05-01 NOTE — Telephone Encounter (Signed)
Several attempts made to get in touch with pt's legal guardian to discuss next steps for patient per MD recommendations. Message left to call back ASAP. If not heard from legal guardian by this afternoon then will contact her supervisor, Rex Kras, at [phone].

## 2020-05-04 ENCOUNTER — Telehealth: Payer: Self-pay | Admitting: *Deleted

## 2020-05-04 NOTE — Telephone Encounter (Signed)
Referral faxed to [facility] thoracic oncology clinic at [phone]. The [place] is aware and instructed to contact pt's guardian for any further needs regarding further workup. [facility] will contact pt's guardian with appt details as requested by Lollie Sails ([place] director).

## 2020-05-04 NOTE — Telephone Encounter (Signed)
Message received from Minong Day who is Clinical biochemist in [facility] protective services. Called [name] back and she stated that they would like to refer her to [facility] for further workup since her insurance is out of network for [employer]. Referral to be sent to [facility] and will include contact info for them to contact Nigel Sloop.   Nothing further needed at this time.

## 2020-05-08 ENCOUNTER — Telehealth: Payer: Self-pay | Admitting: *Deleted

## 2020-05-08 NOTE — Telephone Encounter (Signed)
Spoke with pt's son, Melissa George, at length today regarding his mother and recommendations. Melissa George is concerned that his mom's health is declining and that she is suffering. He understands that she was referred to [facility] due to insurance but is requesting for her to be seen here sooner so palliative care/hospice can be initiated. Melissa George states that he is willing to pay out of pocket expense if DSS will give consent for her to come here.   [facility] has seen patient recently at the facility she resides at and at that time pt voiced that she wanted to pursue further workup. Pt's son states that he does not feel pt could tolerate treatment or even biopsy at this time and would like for pt to meet with a provider to review her options for palliative care/hospice and pain management.   After discussing concerns with Melissa George, DSS contacted and messages left with [name] and her director [name] to get consent to schedule pt to see one of our providers to assess patient and offer recommendations. Awaiting call back at this time.   Melissa George has been updated and aware of waiting on consent from DSS.

## 2020-05-11 ENCOUNTER — Encounter: Payer: Self-pay | Admitting: *Deleted

## 2020-05-11 ENCOUNTER — Encounter: Payer: Self-pay | Admitting: Oncology

## 2020-05-11 ENCOUNTER — Inpatient Hospital Stay: Payer: Medicare (Managed Care) | Attending: Oncology | Admitting: Oncology

## 2020-05-11 ENCOUNTER — Inpatient Hospital Stay (HOSPITAL_BASED_OUTPATIENT_CLINIC_OR_DEPARTMENT_OTHER): Payer: Medicare (Managed Care) | Admitting: Hospice and Palliative Medicine

## 2020-05-11 VITALS — BP 92/70 | HR 120 | Temp 98.2°F | Resp 18 | Ht 62.0 in | Wt 89.8 lb

## 2020-05-11 DIAGNOSIS — R918 Other nonspecific abnormal finding of lung field: Secondary | ICD-10-CM | POA: Insufficient documentation

## 2020-05-11 DIAGNOSIS — Z87891 Personal history of nicotine dependence: Secondary | ICD-10-CM | POA: Diagnosis not present

## 2020-05-11 DIAGNOSIS — F419 Anxiety disorder, unspecified: Secondary | ICD-10-CM | POA: Diagnosis not present

## 2020-05-11 DIAGNOSIS — R590 Localized enlarged lymph nodes: Secondary | ICD-10-CM | POA: Diagnosis not present

## 2020-05-11 DIAGNOSIS — Z79899 Other long term (current) drug therapy: Secondary | ICD-10-CM | POA: Insufficient documentation

## 2020-05-11 DIAGNOSIS — Z515 Encounter for palliative care: Secondary | ICD-10-CM | POA: Insufficient documentation

## 2020-05-11 DIAGNOSIS — R5383 Other fatigue: Secondary | ICD-10-CM | POA: Insufficient documentation

## 2020-05-11 DIAGNOSIS — Z7901 Long term (current) use of anticoagulants: Secondary | ICD-10-CM | POA: Insufficient documentation

## 2020-05-11 DIAGNOSIS — Z7951 Long term (current) use of inhaled steroids: Secondary | ICD-10-CM | POA: Insufficient documentation

## 2020-05-11 DIAGNOSIS — J449 Chronic obstructive pulmonary disease, unspecified: Secondary | ICD-10-CM | POA: Insufficient documentation

## 2020-05-11 DIAGNOSIS — R160 Hepatomegaly, not elsewhere classified: Secondary | ICD-10-CM | POA: Insufficient documentation

## 2020-05-11 DIAGNOSIS — R0789 Other chest pain: Secondary | ICD-10-CM | POA: Insufficient documentation

## 2020-05-11 DIAGNOSIS — G893 Neoplasm related pain (acute) (chronic): Secondary | ICD-10-CM | POA: Insufficient documentation

## 2020-05-11 DIAGNOSIS — J9611 Chronic respiratory failure with hypoxia: Secondary | ICD-10-CM | POA: Diagnosis not present

## 2020-05-11 DIAGNOSIS — Z9981 Dependence on supplemental oxygen: Secondary | ICD-10-CM | POA: Insufficient documentation

## 2020-05-11 DIAGNOSIS — E43 Unspecified severe protein-calorie malnutrition: Secondary | ICD-10-CM | POA: Diagnosis not present

## 2020-05-11 DIAGNOSIS — I739 Peripheral vascular disease, unspecified: Secondary | ICD-10-CM | POA: Insufficient documentation

## 2020-05-11 DIAGNOSIS — Z7189 Other specified counseling: Secondary | ICD-10-CM

## 2020-05-11 MED ORDER — OXYCODONE HCL 5 MG PO TABS
ORAL_TABLET | ORAL | 0 refills | Status: AC
Start: 2020-05-11 — End: ?

## 2020-05-11 NOTE — Progress Notes (Signed)
  Oncology Nurse Navigator Documentation  Navigator Location: [facility]-Med Onc (05/11/20 1300)   )Navigator Encounter Type: Initial MedOnc (05/11/20 1300)                         Barriers/Navigation Needs: Coordination of Care;Education;Family Concerns;Morbidities/Frailty;Pain;Transportation (05/11/20 1300) Education: Pain/ Symptom Management (05/11/20 1300) Interventions: Referrals (05/11/20 1300) Referrals:  (hospice) (05/11/20 1300) Coordination of Care: Hospice/Palliative Care (05/11/20 1300)        Acuity: Level 4-High Needs (Greater Than 4 Barriers Identified) (05/11/20 1300)    met with patient and her son during initial consult with Dr. Tasia Catchings. All results and recommendations reviewed with patient and her son. All questions answered. After in-depth discussion, pt decided to pursue hospice services at this time. Recommendations written out to return to facility for their documentation. Printed prescription for oxycodone given to patient in an envelope to give back to the facility. Contact info given to her son to call with any further questions or needs. Pt and her son verbalized understanding. Nothing further needed at this time.      Time Spent with Patient: 120 (05/11/20 1300)

## 2020-05-11 NOTE — Progress Notes (Signed)
[place]  Telephone:([phone] [phone]   Name: [name] Date: 05/11/2020 MRN: [id number]  DOB: [birth year]  Patient Care Team: Remi Haggard, FNP as PCP - General (Family Medicine) Berneta Sages, NP as Nurse Practitioner (Adult Health Nurse Practitioner)    REASON FOR CONSULTATION: Melissa George is a 70 y.o. female with multiple medical problems including CAD, COPD, chronic respiratory failure on 4 L O2, depression, anxiety.  Patient was hospitalized in October 2021 for right hip fracture requiring hemiarthroplasty on 01/21/2020.  At that time, patient was previously followed by [facility] hospice at the group home. She was discharged from the hospital to [facility] without hospice services.  Patient was seen in the ER on 03/14/2020 with shortness of breath.  CT revealed a left upper lobe lung mass.  PET scan on 04/21/2020 revealed intensely FDG avid left upper lobe lung mass with hilar lymph nodes and hepatic mass concerning for metastatic disease.  Patient presented to the ER on 04/30/2020 with complaint of chest pain and shortness of breath likely secondary to COPD exacerbation, which was amenable to outpatient management.  Patient is being actively followed by [facility] palliative care. She was referred to palliative care at the [facility] to help with comanagemen of her symptoms and to coordinate oncology goals/plan of caret.  SOCIAL HISTORY:     reports that she quit smoking about 5 years ago. Her smoking use included cigarettes. She has a 3.75 pack-year smoking history. She has never used smokeless tobacco. She reports that she does not drink alcohol and does not use drugs.  Patient is a ward of the state with [place] serving as her legal guardian.  She is currently a resident at [facility].  Patient has a son who is involved in her care.  ADVANCE DIRECTIVES:  Patient has a legal  guardian as noted above.   CODE STATUS: DNR  PAST MEDICAL HISTORY: Past Medical History:  Diagnosis Date  . Anxiety   . Anxiety   . COPD (chronic obstructive pulmonary disease) ([place])   . PAD (peripheral artery disease) ([place])   . 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 ABDOMINAL HYSTERECTOMY    . APPENDECTOMY    . CARDIAC CATHETERIZATION N/A 10/31/2014   Procedure: Left Heart Cath and Coronary Angiography;  Surgeon: Isaias Cowman, MD;  Location: [facility] [place] CV [facility];  Service: Cardiovascular;  Laterality: N/A;  . CESAREAN SECTION    . HAND SURGERY    . HIP ARTHROPLASTY Right 01/21/2020   Procedure: ARTHROPLASTY BIPOLAR HIP (HEMIARTHROPLASTY);  Surgeon: Lovell Sheehan, MD;  Location: [facility] ORS;  Service: Orthopedics;  Laterality: Right;    HEMATOLOGY/ONCOLOGY HISTORY:  Oncology History   No history exists.    ALLERGIES:  is allergic to ciprofloxacin, levofloxacin, morphine and related, sulfa antibiotics, symbicort [budesonide-formoterol fumarate], advair diskus [fluticasone-salmeterol], and nickel.  MEDICATIONS:  Current Outpatient Medications  Medication Sig Dispense Refill  . acetaminophen (TYLENOL) 325 MG tablet Take 2 tablets (650 mg total) by mouth every 6 (six) hours as needed for mild pain (or Fever >/= 101).    Marland Kitchen albuterol (PROVENTIL HFA;VENTOLIN HFA) 108 (90 BASE) MCG/ACT inhaler Inhale 2 puffs into the lungs every 6 (six) hours as needed for wheezing or shortness of breath. (Patient taking differently: Inhale 2 puffs into the lungs every 4 (four) hours as needed for  wheezing or shortness of breath.) 1 Inhaler 1  . busPIRone (BUSPAR) 5 MG tablet Take 5 mg by mouth 2 (two) times daily.    . calcium carbonate (TUMS - DOSED IN MG ELEMENTAL CALCIUM) 500 MG chewable tablet Chew 1 tablet by mouth daily.    Marland Kitchen enoxaparin (LOVENOX) 40 MG/0.4ML injection Inject 0.4 mLs (40 mg total) into the skin daily  for 14 days. (Patient not taking: Reported on 05/11/2020)  0  . escitalopram (LEXAPRO) 20 MG tablet Take 20 mg by mouth daily.    . Fluticasone Furoate (ARNUITY ELLIPTA) 100 MCG/ACT AEPB Inhale 1 puff into the lungs daily.    . hydrOXYzine (VISTARIL) 25 MG capsule TAKE ONE CAPSULE BY MOUTH EVERY 6 HOURS AS NEEDED    . ipratropium-albuterol (DUONEB) 0.5-2.5 (3) MG/3ML SOLN Take 3 mLs by nebulization 2 (two) times daily as needed.    Marland Kitchen levothyroxine (SYNTHROID, LEVOTHROID) 50 MCG tablet Take 50 mcg by mouth daily. On Monday, Wednesday, Friday    . levothyroxine (SYNTHROID, LEVOTHROID) 75 MCG tablet Take 75 mcg by mouth daily. On Thursday, Saturday, Sundays    . linaclotide (LINZESS) 72 MCG capsule Take 72 mcg by mouth daily as needed.    Marland Kitchen LORazepam (ATIVAN) 0.5 MG tablet Take 1 tablet (0.5 mg total) by mouth 2 (two) times daily. 10 tablet 0  . magnesium hydroxide (MILK OF MAGNESIA) 400 MG/5ML suspension Take 30 mLs by mouth daily as needed for mild constipation.    . mirtazapine (REMERON) 30 MG tablet Take 30 mg by mouth at bedtime.    . Morphine Sulfate (MORPHINE CONCENTRATE) 10 MG/0.5ML SOLN concentrated solution Place 0.25 mLs (5 mg total) under the tongue every 4 (four) hours as needed for severe pain or shortness of breath. 30 mL 0  . nystatin (MYCOSTATIN) 100000 UNIT/ML suspension Take 5 mLs (500,000 Units total) by mouth 4 (four) times daily. (Patient not taking: Reported on 01/21/2020) 60 mL 0  . ondansetron (ZOFRAN ODT) 4 MG disintegrating tablet Take 1 tablet (4 mg total) by mouth every 8 (eight) hours as needed for nausea or vomiting. 20 tablet 0  . oxyCODONE (ROXICODONE) 5 MG immediate release tablet Take 1 tablet (5 mg total) by mouth every 8 (eight) hours as needed for severe pain. 13 tablet 0  . pantoprazole (PROTONIX) 40 MG tablet Take 1 tablet (40 mg total) by mouth daily. 30 tablet 0  . perphenazine (TRILAFON) 8 MG tablet Take 8 mg by mouth at bedtime.    . polyethylene glycol  (MIRALAX / GLYCOLAX) 17 g packet Take 17 g by mouth daily. 14 each 0  . predniSONE (DELTASONE) 20 MG tablet Take 1 tablet (20 mg total) by mouth daily. 7 tablet 0  . promethazine (PHENERGAN) 25 MG tablet Take 25 mg by mouth every 6 (six) hours as needed for nausea or vomiting.    . senna (SENOKOT) 8.6 MG TABS tablet Take 1 tablet by mouth at bedtime.    . theophylline (THEO-24) 100 MG 24 hr capsule Take 1 capsule (100 mg total) by mouth at bedtime. 30 capsule 0  . Tiotropium Bromide Monohydrate (SPIRIVA RESPIMAT) 2.5 MCG/ACT AERS Inhale 2 puffs into the lungs daily.    . traZODone (DESYREL) 50 MG tablet Take 0.5 tablets (25 mg total) by mouth at bedtime as needed for sleep.     No current facility-administered medications for this visit.    VITAL SIGNS: There were no vitals taken for this visit. There were no vitals filed for this  visit.  Estimated body mass index is 16.42 kg/m as calculated from the following:   Height as of an earlier encounter on 05/11/20: 5\' 2"  (1.575 m).   Weight as of an earlier encounter on 05/11/20: 89 lb 12.8 oz (40.7 kg).  LABS: CBC:    Component Value Date/Time   WBC 12.2 (H) 04/29/2020 2105   HGB 10.4 (L) 04/29/2020 2105   HGB 14.6 03/02/2014 1334   HCT 33.8 (L) 04/29/2020 2105   HCT 42.9 03/02/2014 1334   PLT 270 04/29/2020 2105   PLT 259 03/02/2014 1334   MCV 97.7 04/29/2020 2105   MCV 96 03/02/2014 1334   NEUTROABS 9.4 (H) 04/29/2020 2105   LYMPHSABS 1.3 04/29/2020 2105   MONOABS 1.2 (H) 04/29/2020 2105   EOSABS 0.2 04/29/2020 2105   BASOSABS 0.0 04/29/2020 2105   Comprehensive Metabolic Panel:    Component Value Date/Time   NA 142 04/29/2020 2105   NA 141 03/02/2014 1334   K 3.7 04/29/2020 2105   K 3.3 (L) 03/02/2014 1334   CL 97 (L) 04/29/2020 2105   CL 103 03/02/2014 1334   CO2 33 (H) 04/29/2020 2105   CO2 31 03/02/2014 1334   BUN 20 04/29/2020 2105   BUN 22 (H) 03/02/2014 1334   CREATININE 0.73 04/29/2020 2105   CREATININE 0.72  03/02/2014 1334   GLUCOSE 147 (H) 04/29/2020 2105   GLUCOSE 92 03/02/2014 1334   CALCIUM 8.9 04/29/2020 2105   CALCIUM 8.8 03/02/2014 1334   AST 14 (L) 04/29/2020 2105   AST 22 03/02/2014 1334   ALT 9 04/29/2020 2105   ALT 30 03/02/2014 1334   ALKPHOS 86 04/29/2020 2105   ALKPHOS 82 03/02/2014 1334   BILITOT 0.4 04/29/2020 2105   BILITOT 0.3 03/02/2014 1334   PROT 6.8 04/29/2020 2105   PROT 7.1 03/02/2014 1334   ALBUMIN 3.4 (L) 04/29/2020 2105   ALBUMIN 3.6 03/02/2014 1334    RADIOGRAPHIC STUDIES: DG Chest 2 View  Result Date: 04/29/2020 CLINICAL DATA:  Chest tightness EXAM: CHEST - 2 VIEW COMPARISON:  03/14/2020, CT 03/14/2020 FINDINGS: Emphysematous disease. Large left upper lobe lung mass abutting the pleural surface of left apex, considerably increased in size compared with radiograph from December 2021. Probable stable size since PET CT in January of this year. Small foci of nodularity at the periphery of the mass. Normal heart size. No pneumothorax IMPRESSION: 1. Large left upper lobe lung mass abutting the apical pleural surface. Mild nodularity at the periphery of the mass which could be due to infection or additional disease. 2. Emphysematous disease. Electronically Signed   By: Donavan Foil M.D.   On: 04/29/2020 21:17   NM PET Image Initial (PI) Skull Base To Thigh  Result Date: 04/21/2020 CLINICAL DATA:  Initial treatment strategy for lung mass. EXAM: NUCLEAR MEDICINE PET SKULL BASE TO THIGH TECHNIQUE: 5.28 mCi F-18 FDG was injected intravenously. Full-ring PET imaging was performed from the skull base to thigh after the radiotracer. CT data was obtained and used for attenuation correction and anatomic localization. Fasting blood glucose: 86 mg/dl COMPARISON:  03/14/2020 FINDINGS: Mediastinal blood pool activity: SUV max 2.06 Liver activity: SUV max NA NECK: No hypermetabolic lymph nodes in the neck. Incidental CT findings: none CHEST: Large FDG avid lung mass within the  posterior left upper lobe is again seen. This measures 7.1 x 5.1 by 5.3 cm and has an SUV max of 20.4, image 55/3. This has increased from 3.2 x 2.5 x 3.1 cm. Suspect posterior  chest wall invasion between the second and third, third and fourth rib interspaces. Enlarged and FDG avid left hilar lymph nodes have an SUV max of 18.7. No hypermetabolic mediastinal, subcarinal or right hilar lymph nodes. No FDG avid supraclavicular or axillary lymph nodes. Incidental CT findings: Advanced changes of emphysema identified. Calcified granuloma noted within the right upper lobe. ABDOMEN/PELVIS: No FDG uptake within the subtle focus of increased tracer uptake within the left hepatic lobe has an SUV max of 3.32. There is a subtle low-attenuation structure in this area on the CT images measuring approximately 1.3 cm, image 126/3. No abnormal uptake within the pancreas, spleen or adrenal glands. No FDG avid abdominopelvic lymph nodes. Incidental CT findings: Aortic atherosclerosis.  No aneurysm. SKELETON: No focal hypermetabolic activity to suggest skeletal metastasis. Incidental CT findings: Thoracolumbar scoliosis.Previous bilateral hip arthroplasty. Lumbar spondylosis identified. Anterolisthesis of L4 on L5 is noted IMPRESSION: 1. Left upper lobe lung mass is intensely FDG avid compatible with primary bronchogenic carcinoma. Suspect chest wall involvement with extension through the second and third and third and fourth rib interspaces posteriorly. 2. FDG avid left hilar lymph nodes compatible with metastatic adenopathy. 3. Single small focus of increased uptake within the left hepatic lobe worrisome for liver metastasis. More definitive characterization can be obtained with liver protocol MRI without and with contrast material. 4. Aortic Atherosclerosis (ICD10-I70.0) and Emphysema (ICD10-J43.9). Electronically Signed   By: Kerby Moors M.D.   On: 04/21/2020 12:19    PERFORMANCE STATUS (ECOG) : 3 - Symptomatic, >50%  confined to bed  Review of Systems Unless otherwise noted, a complete review of systems is negative.  Physical Exam General: NAD, frail appearing Cardiovascular: tachy Pulmonary: Exertionally labored Extremities: no edema, no joint deformities Skin: no rashes Neurological: Weakness but otherwise nonfocal  IMPRESSION: Dr. Tasia Catchings and I saw patient today in the clinic.  Patient was accompanied by her son.  Patient's other son participated in the visit via phone.  Extensive conversation was had with patient and family regarding the results of the PET scan and options for further work-up/treatment.  Patient has been declining with progressive weakness, poor oral intake, and weight loss.  She has underlying advanced stage COPD.  She is not felt to be a candidate for chemotherapy. Immunotherapy could potentially be an option but would likely have a delayed response and patient's overall prognosis is felt to likely be measured in weeks to months.  Hospice was recommended and both patient and family were in agreement with that plan.  Note the patient has had hospice in the past and is familiar with their services.  Symptomatically, patient is having persistent and severe chest wall pain secondary to the lung mass.  She has been receiving oxycodone at ALF but only intermittently and says that she is having difficulty with staff responsiveness.  MAR reviewed and it appears that patient has been tolerating 2-3 doses a day of oxycodone.  We will schedule oxycodone 5 mg 3 times daily.  In addition, patient can also have as needed doses of oxycodone if required for breakthrough pain.  We discussed constipation management with increased utilization of opioids.  I called and spoke with APS program manager - Kizzie Bane.  She will have patient social worker call me back to discuss plan.  PLAN: -Best supportive care -Referral to hospice -Schedule oxycodone 5 mg 3 times daily -Daily bowel regimen -DNR on  file -RTC as needed  Case and plan discussed with Dr. Tasia Catchings   Patient expressed understanding and  was in agreement with this plan. She also understands that She can call the clinic at any time with any questions, concerns, or complaints.     Time Total: 45 minutes  Visit consisted of counseling and education dealing with the complex and emotionally intense issues of symptom management and palliative care in the setting of serious and potentially life-threatening illness.Greater than 50%  of this time was spent counseling and coordinating care related to the above assessment and plan.  Signed by: Altha Harm, PhD, NP-C

## 2020-05-11 NOTE — Progress Notes (Signed)
Patient here as new patient.
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
## 2020-05-12 ENCOUNTER — Telehealth: Payer: Self-pay | Admitting: Hospice and Palliative Medicine

## 2020-05-12 NOTE — Telephone Encounter (Signed)
I received a call back from Nigel Sloop, social worker with [facility].  We discussed the conversation yesterday in the clinic regarding scope of cancer and family/patient decision to pursue hospice.  [name] verbalized that she was also in agreement with hospice.  All questions answered.

## 2022-01-08 IMAGING — DX DG CHEST 1V PORT
1 series · 1 of 1 positions shown · non-contrast
Comparison: Chest x-ray dated 01/24/2020

CLINICAL DATA: Shortness of breath

EXAM:
PORTABLE CHEST 1 VIEW

[chest ap]
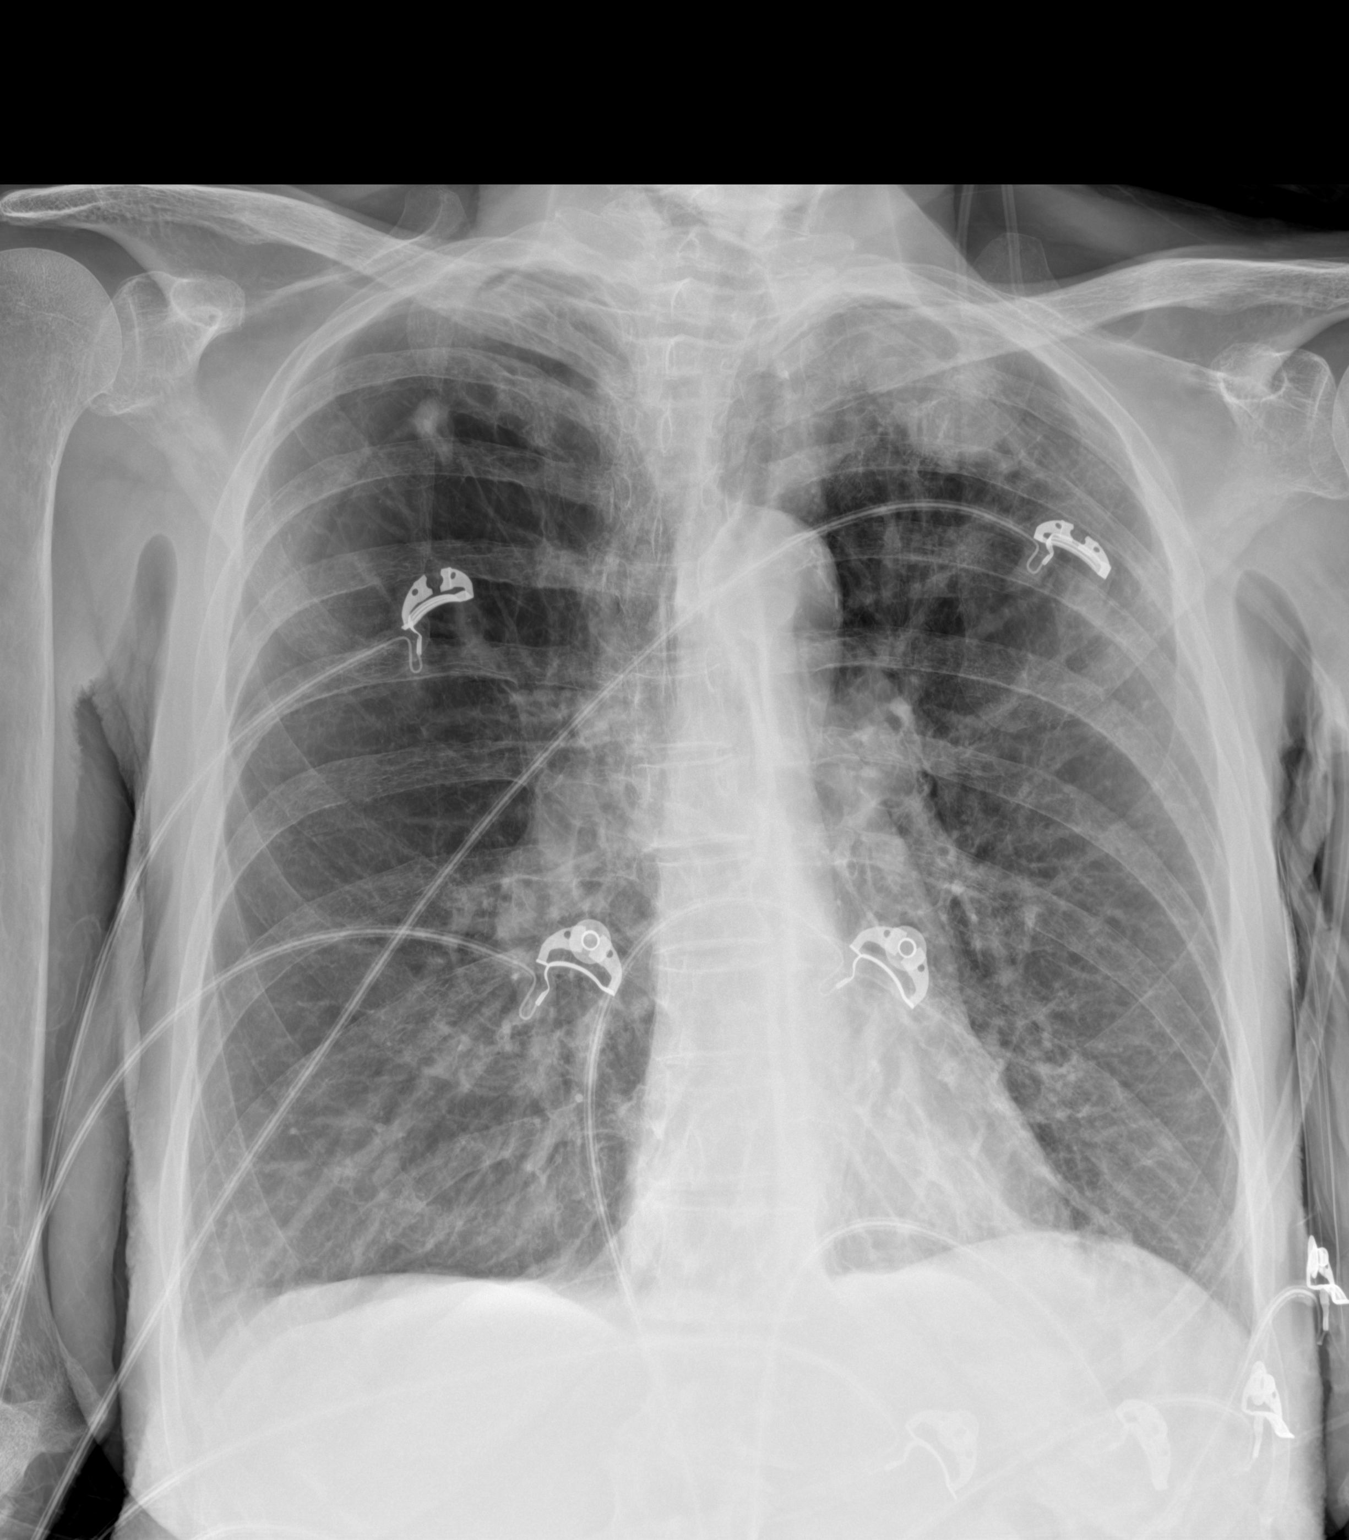

[1 of 1 positions shown; findings below may reference images not displayed]

FINDINGS: Increased opacity at the LEFT lung apex. Stable nodular opacity at
the RIGHT lung apex. Lungs are hyperexpanded. Coarse lung markings
are again seen bilaterally indicating underlying chronic
interstitial lung disease and emphysema. No pleural effusion or
pneumothorax. Osseous structures about the chest are unremarkable.
IMPRESSION: 1. Increased opacity at the LEFT lung apex, pneumonia versus
neoplastic mass, favor neoplastic mass. Recommend chest CT for
further characterization.
2. Stable nodular opacity at the RIGHT lung apex, described as a
benign calcified granuloma on previous chest CT.
3. COPD/emphysema.

## 2022-02-15 IMAGING — CT NM PET TUM IMG INITIAL (PI) SKULL BASE T - THIGH
1 of 9 series · 1 of 25 positions shown · non-contrast
Comparison: 03/14/2020

CLINICAL DATA: Initial treatment strategy for lung mass.

EXAM:
NUCLEAR MEDICINE PET SKULL BASE TO THIGH
TECHNIQUE: 5.28 mCi F-18 FDG was injected intravenously. Full-ring PET imaging
was performed from the skull base to thigh after the radiotracer. CT
data was obtained and used for attenuation correction and anatomic
localization.
Fasting blood glucose: 86 mg/dl

[Series 3: ct wb 5.0 b30f · axial · 5.0mm · 0.98mm/px · 1 of 251 slices shown]
[im 251/251  brain]
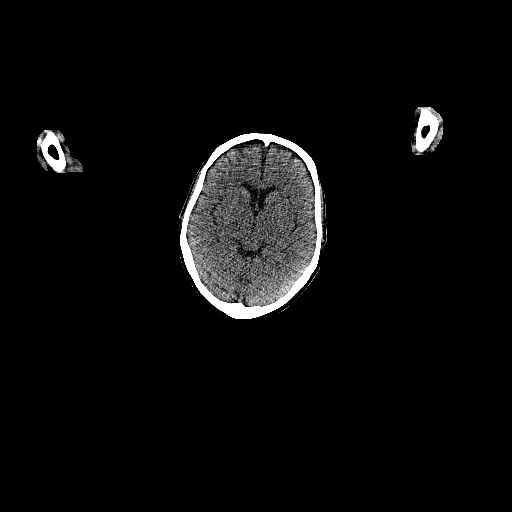

[1 of 25 positions shown; findings below may reference images not displayed]

FINDINGS: Mediastinal blood pool activity: SUV max

Liver activity: SUV max NA

NECK: No hypermetabolic lymph nodes in the neck.

Incidental CT findings: none

CHEST: Large FDG avid lung mass within the posterior left upper lobe
is again seen. This measures 7.1 x 5.1 by 5.3 cm and has an SUV max
of 20.4, image 55/3. This has increased from 3.2 x 2.5 x 3.1 cm.
Suspect posterior chest wall invasion between the second and third,
third and fourth rib interspaces.

Enlarged and FDG avid left hilar lymph nodes have an SUV max of
18.7. No hypermetabolic mediastinal, subcarinal or right hilar lymph
nodes. No FDG avid supraclavicular or axillary lymph nodes.

Incidental CT findings: Advanced changes of emphysema identified.
Calcified granuloma noted within the right upper lobe.

ABDOMEN/PELVIS: No FDG uptake within the subtle focus of increased
tracer uptake within the left hepatic lobe has an SUV max of 3.32.
There is a subtle low-attenuation structure in this area on the CT
images measuring approximately 1.3 cm, image 126/3.

No abnormal uptake within the pancreas, spleen or adrenal glands. No
FDG avid abdominopelvic lymph nodes.

Incidental CT findings: Aortic atherosclerosis.  No aneurysm.

SKELETON: No focal hypermetabolic activity to suggest skeletal
metastasis.

Incidental CT findings: Thoracolumbar scoliosis.Previous bilateral
hip arthroplasty. Lumbar spondylosis identified. Anterolisthesis of
L4 on L5 is noted
IMPRESSION: 1. Left upper lobe lung mass is intensely FDG avid compatible with
primary bronchogenic carcinoma. Suspect chest wall involvement with
extension through the second and third and third and fourth rib
interspaces posteriorly.
2. FDG avid left hilar lymph nodes compatible with metastatic
adenopathy.
3. Single small focus of increased uptake within the left hepatic
lobe worrisome for liver metastasis. More definitive
characterization can be obtained with liver protocol MRI without and
with contrast material.
4. Aortic Atherosclerosis ([id number].J) and Emphysema ([id number].A).
